# Patient Record
Sex: Male | Born: 1945
Health system: Southern US, Community
[De-identification: ages and names within clinical notes are randomized; demographics above are authoritative.]

## PROBLEM LIST (undated history)

## (undated) DIAGNOSIS — F418 Other specified anxiety disorders: Secondary | ICD-10-CM

## (undated) DIAGNOSIS — F32A Depression, unspecified: Secondary | ICD-10-CM

## (undated) DIAGNOSIS — Z7289 Other problems related to lifestyle: Secondary | ICD-10-CM

## (undated) DIAGNOSIS — G20 Parkinson's disease: Secondary | ICD-10-CM

## (undated) DIAGNOSIS — I10 Essential (primary) hypertension: Secondary | ICD-10-CM

## (undated) DIAGNOSIS — H269 Unspecified cataract: Secondary | ICD-10-CM

## (undated) DIAGNOSIS — Z789 Other specified health status: Secondary | ICD-10-CM

## (undated) DIAGNOSIS — F329 Major depressive disorder, single episode, unspecified: Secondary | ICD-10-CM

## (undated) DIAGNOSIS — I639 Cerebral infarction, unspecified: Secondary | ICD-10-CM

## (undated) DIAGNOSIS — Z720 Tobacco use: Secondary | ICD-10-CM

## (undated) DIAGNOSIS — F1910 Other psychoactive substance abuse, uncomplicated: Secondary | ICD-10-CM

## (undated) DIAGNOSIS — G709 Myoneural disorder, unspecified: Secondary | ICD-10-CM

## (undated) DIAGNOSIS — E785 Hyperlipidemia, unspecified: Secondary | ICD-10-CM

## (undated) DIAGNOSIS — J449 Chronic obstructive pulmonary disease, unspecified: Secondary | ICD-10-CM

## (undated) DIAGNOSIS — G9389 Other specified disorders of brain: Secondary | ICD-10-CM

## (undated) DIAGNOSIS — F419 Anxiety disorder, unspecified: Secondary | ICD-10-CM

## (undated) HISTORY — DX: Anxiety disorder, unspecified: F41.9

## (undated) HISTORY — DX: Myoneural disorder, unspecified: G70.9

## (undated) HISTORY — DX: Hyperlipidemia, unspecified: E78.5

## (undated) HISTORY — PX: APPENDECTOMY: SHX54

## (undated) HISTORY — DX: Major depressive disorder, single episode, unspecified: F32.9

## (undated) HISTORY — PX: EYE SURGERY: SHX253

## (undated) HISTORY — DX: Other psychoactive substance abuse, uncomplicated: F19.10

## (undated) HISTORY — DX: Essential (primary) hypertension: I10

## (undated) HISTORY — DX: Depression, unspecified: F32.A

## (undated) HISTORY — DX: Cerebral infarction, unspecified: I63.9

## (undated) HISTORY — DX: Unspecified cataract: H26.9

## (undated) HISTORY — PX: SHOULDER SURGERY: SHX246

---

## 2009-10-06 ENCOUNTER — Emergency Department (HOSPITAL_COMMUNITY): Admission: EM | Admit: 2009-10-06 | Discharge: 2009-10-06 | Payer: Self-pay | Admitting: Emergency Medicine

## 2010-06-25 ENCOUNTER — Emergency Department (HOSPITAL_COMMUNITY): Admission: EM | Admit: 2010-06-25 | Discharge: 2010-06-25 | Payer: Self-pay | Admitting: Emergency Medicine

## 2010-11-08 LAB — CBC
HCT: 45.7 % (ref 39.0–52.0)
MCH: 32.9 pg (ref 26.0–34.0)
MCV: 95.5 fL (ref 78.0–100.0)
RBC: 4.78 MIL/uL (ref 4.22–5.81)
WBC: 10.4 10*3/uL (ref 4.0–10.5)

## 2010-11-08 LAB — DIFFERENTIAL
Basophils Absolute: 0 10*3/uL (ref 0.0–0.1)
Eosinophils Absolute: 0 10*3/uL (ref 0.0–0.7)
Eosinophils Relative: 0 % (ref 0–5)
Lymphocytes Relative: 15 % (ref 12–46)
Monocytes Absolute: 0.5 10*3/uL (ref 0.1–1.0)

## 2010-11-08 LAB — COMPREHENSIVE METABOLIC PANEL
ALT: 16 U/L (ref 0–53)
AST: 22 U/L (ref 0–37)
Albumin: 3.7 g/dL (ref 3.5–5.2)
Alkaline Phosphatase: 83 U/L (ref 39–117)
CO2: 24 mEq/L (ref 19–32)
Chloride: 99 mEq/L (ref 96–112)
GFR calc Af Amer: >60 mL/min (ref >60)
GFR calc non Af Amer: >60 mL/min (ref >60)
Potassium: 3.1 mEq/L — ABNORMAL LOW (ref 3.5–5.1)
Sodium: 131 mEq/L — ABNORMAL LOW (ref 135–145)
Total Bilirubin: 0.7 mg/dL (ref 0.3–1.2)

## 2010-11-08 LAB — CK TOTAL AND CKMB (NOT AT ARMC): CK, MB: 1.8 ng/mL (ref 0.3–4.0)

## 2010-11-16 LAB — TSH: TSH: 1.924 u[IU]/mL (ref 0.350–4.500)

## 2010-11-16 LAB — CBC
HCT: 49.3 % (ref 39.0–52.0)
MCHC: 34.4 g/dL (ref 30.0–36.0)
MCV: 95.1 fL (ref 78.0–100.0)
RBC: 5.18 MIL/uL (ref 4.22–5.81)

## 2010-11-16 LAB — RAPID URINE DRUG SCREEN, HOSP PERFORMED
Cocaine: NOT DETECTED
Tetrahydrocannabinol: NOT DETECTED

## 2010-11-16 LAB — DIFFERENTIAL
Basophils Absolute: 0 10*3/uL (ref 0.0–0.1)
Eosinophils Relative: 1 % (ref 0–5)
Lymphocytes Relative: 22 % (ref 12–46)
Lymphs Abs: 2 10*3/uL (ref 0.7–4.0)
Neutro Abs: 6.3 10*3/uL (ref 1.7–7.7)
Neutrophils Relative %: 70 % (ref 43–77)

## 2010-11-16 LAB — COMPREHENSIVE METABOLIC PANEL
AST: 21 U/L (ref 0–37)
BUN: 5 mg/dL — ABNORMAL LOW (ref 6–23)
CO2: 25 mEq/L (ref 19–32)
Calcium: 8.6 mg/dL (ref 8.4–10.5)
Creatinine, Ser: 1.02 mg/dL (ref 0.4–1.5)
GFR calc Af Amer: >60 mL/min (ref >60)
GFR calc non Af Amer: >60 mL/min (ref >60)
Total Bilirubin: 0.6 mg/dL (ref 0.3–1.2)

## 2015-01-15 ENCOUNTER — Observation Stay (HOSPITAL_BASED_OUTPATIENT_CLINIC_OR_DEPARTMENT_OTHER)
Admission: EM | Admit: 2015-01-15 | Discharge: 2015-01-16 | Disposition: A | Discharge status: Home / Self Care | Payer: Medicare Other | Source: Home / Self Care | Attending: Emergency Medicine | Admitting: Emergency Medicine

## 2015-01-15 ENCOUNTER — Encounter (HOSPITAL_COMMUNITY): Payer: Self-pay | Admitting: *Deleted

## 2015-01-15 ENCOUNTER — Emergency Department (HOSPITAL_COMMUNITY): Payer: Medicare Other

## 2015-01-15 DIAGNOSIS — R739 Hyperglycemia, unspecified: Secondary | ICD-10-CM | POA: Diagnosis present

## 2015-01-15 DIAGNOSIS — F418 Other specified anxiety disorders: Secondary | ICD-10-CM | POA: Diagnosis present

## 2015-01-15 DIAGNOSIS — F17210 Nicotine dependence, cigarettes, uncomplicated: Secondary | ICD-10-CM | POA: Diagnosis present

## 2015-01-15 DIAGNOSIS — J441 Chronic obstructive pulmonary disease with (acute) exacerbation: Secondary | ICD-10-CM | POA: Diagnosis present

## 2015-01-15 DIAGNOSIS — J9801 Acute bronchospasm: Secondary | ICD-10-CM | POA: Diagnosis present

## 2015-01-15 DIAGNOSIS — R471 Dysarthria and anarthria: Secondary | ICD-10-CM | POA: Diagnosis present

## 2015-01-15 DIAGNOSIS — I639 Cerebral infarction, unspecified: Principal | ICD-10-CM | POA: Diagnosis present

## 2015-01-15 DIAGNOSIS — Z825 Family history of asthma and other chronic lower respiratory diseases: Secondary | ICD-10-CM

## 2015-01-15 DIAGNOSIS — G9389 Other specified disorders of brain: Secondary | ICD-10-CM | POA: Diagnosis present

## 2015-01-15 DIAGNOSIS — R930 Abnormal findings on diagnostic imaging of skull and head, not elsewhere classified: Secondary | ICD-10-CM | POA: Diagnosis present

## 2015-01-15 DIAGNOSIS — E669 Obesity, unspecified: Secondary | ICD-10-CM | POA: Diagnosis present

## 2015-01-15 DIAGNOSIS — G20A1 Parkinson's disease without dyskinesia, without mention of fluctuations: Secondary | ICD-10-CM | POA: Diagnosis present

## 2015-01-15 DIAGNOSIS — R29810 Facial weakness: Secondary | ICD-10-CM | POA: Diagnosis present

## 2015-01-15 DIAGNOSIS — G20 Parkinson's disease: Secondary | ICD-10-CM | POA: Diagnosis present

## 2015-01-15 DIAGNOSIS — Z7982 Long term (current) use of aspirin: Secondary | ICD-10-CM

## 2015-01-15 DIAGNOSIS — Z79899 Other long term (current) drug therapy: Secondary | ICD-10-CM | POA: Insufficient documentation

## 2015-01-15 DIAGNOSIS — R4781 Slurred speech: Secondary | ICD-10-CM

## 2015-01-15 DIAGNOSIS — R42 Dizziness and giddiness: Secondary | ICD-10-CM | POA: Diagnosis not present

## 2015-01-15 DIAGNOSIS — Z720 Tobacco use: Secondary | ICD-10-CM | POA: Insufficient documentation

## 2015-01-15 HISTORY — DX: Parkinson's disease: G20

## 2015-01-15 LAB — COMPREHENSIVE METABOLIC PANEL
ALT: 12 U/L — ABNORMAL LOW (ref 17–63)
AST: 18 U/L (ref 15–41)
Albumin: 3.6 g/dL (ref 3.5–5.0)
Alkaline Phosphatase: 84 U/L (ref 38–126)
Anion gap: 5 (ref 5–15)
BUN: 15 mg/dL (ref 6–20)
CALCIUM: 8.2 mg/dL — AB (ref 8.9–10.3)
CHLORIDE: 108 mmol/L (ref 101–111)
CO2: 28 mmol/L (ref 22–32)
CREATININE: 1.03 mg/dL (ref 0.61–1.24)
Glucose, Bld: 116 mg/dL — ABNORMAL HIGH (ref 65–99)
Potassium: 3.8 mmol/L (ref 3.5–5.1)
SODIUM: 141 mmol/L (ref 135–145)
TOTAL PROTEIN: 6.5 g/dL (ref 6.5–8.1)
Total Bilirubin: 0.6 mg/dL (ref 0.3–1.2)

## 2015-01-15 LAB — CBC WITH DIFFERENTIAL/PLATELET
BASOS PCT: 1 % (ref 0–1)
Basophils Absolute: 0.1 10*3/uL (ref 0.0–0.1)
EOS ABS: 0.4 10*3/uL (ref 0.0–0.7)
EOS PCT: 4 % (ref 0–5)
HCT: 42.3 % (ref 39.0–52.0)
Hemoglobin: 14.5 g/dL (ref 13.0–17.0)
LYMPHS ABS: 2.7 10*3/uL (ref 0.7–4.0)
LYMPHS PCT: 31 % (ref 12–46)
MCH: 32 pg (ref 26.0–34.0)
MCHC: 34.3 g/dL (ref 30.0–36.0)
MCV: 93.4 fL (ref 78.0–100.0)
Monocytes Absolute: 0.5 10*3/uL (ref 0.1–1.0)
Monocytes Relative: 6 % (ref 3–12)
NEUTROS ABS: 5.1 10*3/uL (ref 1.7–7.7)
NEUTROS PCT: 58 % (ref 43–77)
PLATELETS: 209 10*3/uL (ref 150–400)
RBC: 4.53 MIL/uL (ref 4.22–5.81)
RDW: 13 % (ref 11.5–15.5)
WBC: 8.8 10*3/uL (ref 4.0–10.5)

## 2015-01-15 LAB — TROPONIN I: Troponin I: <0.03 ng/mL (ref <0.031)

## 2015-01-15 LAB — BRAIN NATRIURETIC PEPTIDE: B NATRIURETIC PEPTIDE 5: 35 pg/mL (ref 0.0–100.0)

## 2015-01-15 MED ORDER — ASPIRIN 300 MG RE SUPP
300.0000 mg | Freq: Once | RECTAL | Status: AC
  Administered 2015-01-15: 300 mg via RECTAL
  Filled 2015-01-15: qty 1

## 2015-01-15 MED ORDER — IPRATROPIUM-ALBUTEROL 0.5-2.5 (3) MG/3ML IN SOLN
3.0000 mL | Freq: Once | RESPIRATORY_TRACT | Status: AC
  Administered 2015-01-15: 3 mL via RESPIRATORY_TRACT
  Filled 2015-01-15: qty 3

## 2015-01-15 MED ORDER — METHYLPREDNISOLONE SODIUM SUCC 125 MG IJ SOLR
125.0000 mg | Freq: Once | INTRAMUSCULAR | Status: AC
  Administered 2015-01-15: 125 mg via INTRAVENOUS
  Filled 2015-01-15: qty 2

## 2015-01-15 MED ORDER — ASPIRIN 81 MG PO CHEW: 324.0000 mg | CHEWABLE_TABLET | Freq: Once | ORAL | Status: DC

## 2015-01-15 NOTE — ED Notes (Signed)
Pt reporting increasing weakness and some SOB today.  Family reports that they noticed some possible increased difficulty in pt's speech.  Reports symptoms began aprox 8 this morning. Pt does report some worsening of parkinson's symptoms.

## 2015-01-15 NOTE — ED Provider Notes (Signed)
CSN: [id number]     Arrival date & time 01/15/15  2028 History  This chart was scribed for Lucas Essex, MD by Jeanell Sparrow, ED Scribe. This patient was seen in room APA07/[facility]07 and the patient's care was started at 9:06 PM.  Chief Complaint  Patient presents with  . Shortness of Breath  . Weakness   The history is provided by the patient. No language interpreter was used.   HPI Comments: Lucas [name] is a 69 y.o. male hx of Parkinson's disease who presents to the Emergency Department complaining of intermittent SOB that started today. Pt's sister states that pt started having trouble breathing, generalized weakness, and slurred speech today. She reports that pt was completely fine without today's symptoms yesterday. Pt states that he has a hx of smoking. Pt denies any hx of COPD, asthma, or recent trauma. He also denies any cough, chest pain, abdominal pain, headache, dizziness, decreased appetite, or fever.    Past Medical History  Diagnosis Date  . Parkinson's disease    Past Surgical History  Procedure Laterality Date  . Eye surgery     History reviewed. No pertinent family history. History  Substance Use Topics  . Smoking status: Current Every Day Smoker -- 1.00 packs/day  . Smokeless tobacco: Not on file  . Alcohol Use: Yes     Comment: beer occasionally    Review of Systems A complete 10 system review of systems was obtained and all systems are negative except as noted in the HPI and PMH.   Allergies  Review of patient's allergies indicates no known allergies.  Home Medications   Prior to Admission medications   Medication Sig Start Date End Date Taking? Authorizing Provider  carbidopa-levodopa (SINEMET IR) 25-250 MG per tablet Take 1 tablet by mouth 3 (three) times daily.   Yes Historical Provider, MD  clonazePAM (KLONOPIN) 0.5 MG tablet Take 0.5 mg by mouth 2 (two) times daily as needed for anxiety.   Yes Historical Provider, MD  gabapentin (NEURONTIN) 300 MG  capsule Take 300 mg by mouth 3 (three) times daily.   Yes Historical Provider, MD  Multiple Vitamin (MULTIVITAMIN WITH MINERALS) TABS tablet Take 1 tablet by mouth daily.   Yes Historical Provider, MD  PARoxetine (PAXIL) 40 MG tablet Take 40 mg by mouth every morning.   Yes Historical Provider, MD  rOPINIRole (REQUIP) 1 MG tablet Take 1 mg by mouth 2 (two) times daily.   Yes Historical Provider, MD  BP 121/88 mmHg  Pulse 69  Temp(Src) 98.7 F (37.1 C) (Oral)  Resp 16  Ht 5\' 8"  (1.727 m)  Wt 185 lb (83.915 kg)  BMI 28.14 kg/m2  SpO2 94%   Physical Exam  Constitutional: He is oriented to person, place, and time. He appears well-developed and well-nourished. No distress.  HENT:  Head: Normocephalic and atraumatic.  Mouth/Throat: Oropharynx is clear and moist. No oropharyngeal exudate.  Eyes: Conjunctivae and EOM are normal. Pupils are equal, round, and reactive to light.  Neck: Normal range of motion. Neck supple.  No meningismus.  Cardiovascular: Normal rate, regular rhythm, normal heart sounds and intact distal pulses.   No murmur heard. Pulmonary/Chest: Effort normal. No respiratory distress. He has wheezes.  Moderate air exchange. Diffuse wheezing.   Abdominal: Soft. There is no tenderness. There is no rebound and no guarding.  Musculoskeletal: Normal range of motion. He exhibits no edema or tenderness.  Neurological: He is alert and oriented to person, place, and time. No cranial nerve deficit.  He exhibits normal muscle tone. Coordination normal.  No ataxia on finger to nose bilaterally. No pronator drift. 5/5 strength throughout. CN 2-12 intact. Equal grip strength. Sensation intact.  Slurred speech. Resting tremor, LUE greater than RUE. No facial droop  Skin: Skin is warm.  Psychiatric: He has a normal mood and affect. His behavior is normal.  Nursing note and vitals reviewed.   ED Course  Procedures (including critical care time) DIAGNOSTIC STUDIES: Oxygen Saturation is  95% on RA, normal by my interpretation.    COORDINATION OF CARE: 9:10 PM- Pt advised of plan for treatment which includes medication, radiology, and labs and pt agrees.  Labs Review Labs Reviewed  COMPREHENSIVE METABOLIC PANEL - Abnormal; Notable for the following:    Glucose, Bld 116 (*)    Calcium 8.2 (*)    ALT 12 (*)    All other components within normal limits  CBC WITH DIFFERENTIAL/PLATELET  TROPONIN I  BRAIN NATRIURETIC PEPTIDE  URINALYSIS, ROUTINE W REFLEX MICROSCOPIC    Imaging Review Dg Chest 2 View  01/15/2015   CLINICAL DATA:  Increasing weakness and shortness of breath.  EXAM: CHEST  2 VIEW  COMPARISON:  06/26/2010  FINDINGS: The cardiomediastinal contours are normal. There are diffuse fine reticular opacities throughout both lungs. No consolidation, pleural effusion, or pneumothorax. No acute osseous abnormalities are seen. Degenerative change throughout the spine.  IMPRESSION: Fine diffuse reticular opacities throughout both lungs. This may be chronic, or seen in atypical infection.   Electronically Signed   By: Jeb Levering M.D.   On: 01/15/2015 23:28   Ct Head Wo Contrast  01/15/2015   CLINICAL DATA:  Initial evaluation for increase weakness, slurred speech.  EXAM: CT HEAD WITHOUT CONTRAST  TECHNIQUE: Contiguous axial images were obtained from the base of the skull through the vertex without intravenous contrast.  COMPARISON:  Prior study from 10/06/2009.  FINDINGS: Code cerebral volume within normal limits for patient age. Ventricular prominence out of proportion to the cortical sulcation is stable from previous examination. A few small remote lacunar infarct present within the bilateral basal ganglia.  No acute large vessel territory infarct. No intracranial hemorrhage. No mass lesion or mass effect. No extra-axial fluid collection.  Scalp soft tissues within normal limits. No acute abnormality about the orbits.  Mucosal thickening with layering opacity present within  the partially visualized maxillary sinuses. Small amount of opacity present within the sphenoid sinuses as well. Scattered mucosal thickening present within the ethmoidal air cells. Scattered opacity present within the left mastoid air cells. Right mastoid air cells clear.  Calvarium intact.  IMPRESSION: 1. No acute intracranial process. 2. Small remote lacunar infarcts involving the bilateral basal ganglia. 3. Prominence of the ventricular system relative to cortical sulcation, which can be seen with NPH. This is stable relative to previous CT from 2011.   Electronically Signed   By: Jeannine Boga M.D.   On: 01/15/2015 22:33     EKG Interpretation   Date/Time:  Saturday Jan 15 2015 21:22:12 EDT Ventricular Rate:  70 PR Interval:  182 QRS Duration: 87 QT Interval:  397 QTC Calculation: 428 R Axis:   60 Text Interpretation:  Sinus rhythm Minimal ST depression, lateral leads  Lateral ST depression Confirmed by Wyvonnia Dusky  MD, Sheritta Deeg [phone]) on  01/15/2015 9:26:45 PM      MDM   Final diagnoses:  Dysarthria  COPD exacerbation   Generalized weakness, difficulty breathing and shortness of breath today. No history of asthma or COPD but he  is a smoker. Patient with history of Parkinson's. Family also reports he said slurred speech throughout the day today. Last seen normal yesterday. Code stroke not acitivated due to delayed presentation.  Wheezing on exam, no hypoxia. Nebs, steroids, chest x-ray, CT head.  CXR shows reticular opacities.  Old lacunar infarcts on CT head. Patient with no knowledge of this.  Wheezing and SOB improved with treatment  No hypoxia, no chest pain. Suspect COPD excerbation. Concern for new stroke with dysarthria.  Unable to obtain MRI tonight.  D/w Dr. Shanon Brow who will admit.   I personally performed the services described in this documentation, which was scribed in my presence. The recorded information has been reviewed and is accurate.    Lucas Essex, MD 01/16/15 [phone]

## 2015-01-15 NOTE — ED Notes (Signed)
MD notified that patient did not pass the stroke swallow screen.

## 2015-01-16 DIAGNOSIS — J441 Chronic obstructive pulmonary disease with (acute) exacerbation: Secondary | ICD-10-CM

## 2015-01-16 DIAGNOSIS — R471 Dysarthria and anarthria: Secondary | ICD-10-CM | POA: Diagnosis not present

## 2015-01-16 DIAGNOSIS — J9801 Acute bronchospasm: Secondary | ICD-10-CM

## 2015-01-16 DIAGNOSIS — R930 Abnormal findings on diagnostic imaging of skull and head, not elsewhere classified: Secondary | ICD-10-CM

## 2015-01-16 DIAGNOSIS — G20 Parkinson's disease: Secondary | ICD-10-CM

## 2015-01-16 LAB — CBC
HCT: 45.5 % (ref 39.0–52.0)
Hemoglobin: 15.4 g/dL (ref 13.0–17.0)
MCH: 31.7 pg (ref 26.0–34.0)
MCHC: 33.8 g/dL (ref 30.0–36.0)
MCV: 93.6 fL (ref 78.0–100.0)
Platelets: 241 10*3/uL (ref 150–400)
RBC: 4.86 MIL/uL (ref 4.22–5.81)
RDW: 13.2 % (ref 11.5–15.5)
WBC: 8.5 10*3/uL (ref 4.0–10.5)

## 2015-01-16 LAB — URINALYSIS, ROUTINE W REFLEX MICROSCOPIC
Bilirubin Urine: NEGATIVE
Glucose, UA: NEGATIVE mg/dL
Hgb urine dipstick: NEGATIVE
Ketones, ur: NEGATIVE mg/dL
Leukocytes, UA: NEGATIVE
NITRITE: NEGATIVE
Protein, ur: NEGATIVE mg/dL
Specific Gravity, Urine: 1.01 (ref 1.005–1.030)
UROBILINOGEN UA: 0.2 mg/dL (ref 0.0–1.0)
pH: 7 (ref 5.0–8.0)

## 2015-01-16 LAB — BASIC METABOLIC PANEL
ANION GAP: 10 (ref 5–15)
BUN: 16 mg/dL (ref 6–20)
CALCIUM: 8.7 mg/dL — AB (ref 8.9–10.3)
CO2: 24 mmol/L (ref 22–32)
Chloride: 106 mmol/L (ref 101–111)
Creatinine, Ser: 0.99 mg/dL (ref 0.61–1.24)
GFR calc Af Amer: >60 mL/min (ref >60)
Glucose, Bld: 166 mg/dL — ABNORMAL HIGH (ref 65–99)
Potassium: 3.5 mmol/L (ref 3.5–5.1)
SODIUM: 140 mmol/L (ref 135–145)

## 2015-01-16 MED ORDER — ADULT MULTIVITAMIN W/MINERALS CH
1.0000 | ORAL_TABLET | Freq: Every day | ORAL | Status: DC
  Administered 2015-01-16: 1 via ORAL
  Filled 2015-01-16: qty 1

## 2015-01-16 MED ORDER — SODIUM CHLORIDE 0.9 % IJ SOLN: 3.0000 mL | Freq: Two times a day (BID) | INTRAMUSCULAR | Status: DC

## 2015-01-16 MED ORDER — ASPIRIN EC 81 MG PO TBEC
81.0000 mg | DELAYED_RELEASE_TABLET | Freq: Every day | ORAL | Status: DC
  Administered 2015-01-16: 81 mg via ORAL
  Filled 2015-01-16: qty 1

## 2015-01-16 MED ORDER — GABAPENTIN 300 MG PO CAPS
300.0000 mg | ORAL_CAPSULE | Freq: Three times a day (TID) | ORAL | Status: DC
  Administered 2015-01-16: 300 mg via ORAL
  Filled 2015-01-16: qty 1

## 2015-01-16 MED ORDER — ROPINIROLE HCL 1 MG PO TABS
1.0000 mg | ORAL_TABLET | Freq: Two times a day (BID) | ORAL | Status: DC
  Administered 2015-01-16: 1 mg via ORAL
  Filled 2015-01-16: qty 1

## 2015-01-16 MED ORDER — CARBIDOPA-LEVODOPA 25-250 MG PO TABS: 1.0000 | ORAL_TABLET | Freq: Three times a day (TID) | ORAL | Status: DC

## 2015-01-16 MED ORDER — PNEUMOCOCCAL VAC POLYVALENT 25 MCG/0.5ML IJ INJ
0.5000 mL | INJECTION | INTRAMUSCULAR | Status: DC
  Filled 2015-01-16: qty 0.5

## 2015-01-16 MED ORDER — ALBUTEROL SULFATE (2.5 MG/3ML) 0.083% IN NEBU
2.5000 mg | INHALATION_SOLUTION | Freq: Four times a day (QID) | RESPIRATORY_TRACT | Status: DC
  Administered 2015-01-16 (×2): 2.5 mg via RESPIRATORY_TRACT
  Filled 2015-01-16 (×2): qty 3

## 2015-01-16 MED ORDER — ALBUTEROL SULFATE HFA 108 (90 BASE) MCG/ACT IN AERS: 2.0000 | INHALATION_SPRAY | Freq: Four times a day (QID) | RESPIRATORY_TRACT | Status: DC | PRN

## 2015-01-16 MED ORDER — SODIUM CHLORIDE 0.9 % IV SOLN: 250.0000 mL | INTRAVENOUS | Status: DC | PRN

## 2015-01-16 MED ORDER — SODIUM CHLORIDE 0.9 % IJ SOLN
3.0000 mL | INTRAMUSCULAR | Status: DC | PRN
Start: 2015-01-16 — End: 2015-01-16

## 2015-01-16 MED ORDER — PAROXETINE HCL 20 MG PO TABS
40.0000 mg | ORAL_TABLET | ORAL | Status: DC
  Administered 2015-01-16: 40 mg via ORAL
  Filled 2015-01-16: qty 2

## 2015-01-16 MED ORDER — ALBUTEROL SULFATE (2.5 MG/3ML) 0.083% IN NEBU: 2.5000 mg | INHALATION_SOLUTION | RESPIRATORY_TRACT | Status: DC | PRN

## 2015-01-16 MED ORDER — PREDNISONE 20 MG PO TABS
20.0000 mg | ORAL_TABLET | Freq: Two times a day (BID) | ORAL | Status: DC
  Administered 2015-01-16: 20 mg via ORAL
  Filled 2015-01-16: qty 1

## 2015-01-16 MED ORDER — AZITHROMYCIN 250 MG PO TABS
500.0000 mg | ORAL_TABLET | Freq: Every day | ORAL | Status: AC
  Administered 2015-01-16: 500 mg via ORAL
  Filled 2015-01-16: qty 2

## 2015-01-16 MED ORDER — AZITHROMYCIN 250 MG PO TABS: 250.0000 mg | ORAL_TABLET | Freq: Every day | ORAL | Status: DC

## 2015-01-16 MED ORDER — ASPIRIN 81 MG PO TBEC: 81.0000 mg | DELAYED_RELEASE_TABLET | Freq: Every day | ORAL | Status: AC

## 2015-01-16 MED ORDER — SODIUM CHLORIDE 0.9 % IJ SOLN
3.0000 mL | Freq: Two times a day (BID) | INTRAMUSCULAR | Status: DC
  Administered 2015-01-16: 3 mL via INTRAVENOUS

## 2015-01-16 NOTE — Progress Notes (Signed)
Patient failed swallow screen in the ED. Patient reports always using a straw at home and that he was not used to drinking straight out of a cup. RN repeated swallow screen and patient passed. Cardiac diet is not in place.

## 2015-01-16 NOTE — Discharge Summary (Addendum)
Physician Discharge Summary  Lucas [name] MWN:[id number] DOB: 1945/10/27 DOA: 01/15/2015  PCP: No primary care provider on file.  Admit date: 01/15/2015 Discharge date: 01/16/2015  Time spent: 45 minutes  Recommendations for Outpatient Follow-up:  -Will be discharged home today. -Please note that patient has decided to leave despite urging him to stay to complete his testing for potential CVA. -Patient and his sister have stated that they will follow-up with their neurologist in [place] next week.   Discharge Diagnoses:  Principal Problem:   Dysarthria Active Problems:   Parkinson's disease   Bronchospasm, acute   Abnormal CT scan of head   Discharge Condition: Stable and improved  Filed Weights   01/15/15 2033 01/16/15 0149  Weight: 83.915 kg (185 lb) 79.9 kg (176 lb 2.4 oz)    History of present illness:  69 yo male h/o parkinsons disease last 4 years comes in with sob, wheezing and slurred speech. Pt has not h/o asthma or copd but does smoke. He denies any fevers or coughing. No le edema or swelling. He came in the ED for the wheezing but also for slurred speech which is new. He does not take any sedatives at home, no pain meds or nerve pills. He denies any numbness/tingling anywhere. No weakness in any extremity. No facial drooping or drooling. No new medications recently. He received solumedrol and alb neb x 1 in ED and his wheezing resolved but the slurred speech has persisted. We are asked to observe pt for mri in am of his brain for possible cva.  Hospital Course:   Dysarthria -Per patient and sister this has resolved. Patient thinks that this was due to loose dentures. -On exam I do note a slight left-sided facial droop, however sister Vaughan Basta states that this has been there for a while. -Patient was admitted to complete stroke workup, however MRI is unavailable today and patient is unwilling to stay in the hospital to have this completed. -They state  they will follow-up with his neurologist in [place] next week. -CT scan shows evidence for small remote lacunar infarcts involving the bilateral basal ganglia.  Rest of chronic issues are presumed to be stable.  Spent over 30 minutes counseling patient and his sister on danger of leaving the hospital.  Procedures:  None   Consultations:  None  Discharge Instructions  Discharge Instructions    Increase activity slowly    Complete by:  As directed             Medication List    TAKE these medications        albuterol 108 (90 BASE) MCG/ACT inhaler  Commonly known as:  PROVENTIL HFA;VENTOLIN HFA  Inhale 2 puffs into the lungs every 6 (six) hours as needed for wheezing or shortness of breath.     aspirin 81 MG EC tablet  Take 1 tablet (81 mg total) by mouth daily.     carbidopa-levodopa 25-250 MG per tablet  Commonly known as:  SINEMET IR  Take 1 tablet by mouth 3 (three) times daily.     clonazePAM 0.5 MG tablet  Commonly known as:  KLONOPIN  Take 0.5 mg by mouth 2 (two) times daily as needed for anxiety.     gabapentin 300 MG capsule  Commonly known as:  NEURONTIN  Take 300 mg by mouth 3 (three) times daily.     multivitamin with minerals Tabs tablet  Take 1 tablet by mouth daily.     PARoxetine 40 MG tablet  Commonly known as:  PAXIL  Take 40 mg by mouth every morning.     rOPINIRole 1 MG tablet  Commonly known as:  REQUIP  Take 1 mg by mouth 2 (two) times daily.       No Known Allergies     Follow-up Information    Follow up In 2 days.   Why:  Your neurologist in [place]       The results of significant diagnostics from this hospitalization (including imaging, microbiology, ancillary and laboratory) are listed below for reference.    Significant Diagnostic Studies: Dg Chest 2 View  01/15/2015   CLINICAL DATA:  Increasing weakness and shortness of breath.  EXAM: CHEST  2 VIEW  COMPARISON:  06/26/2010  FINDINGS: The cardiomediastinal contours  are normal. There are diffuse fine reticular opacities throughout both lungs. No consolidation, pleural effusion, or pneumothorax. No acute osseous abnormalities are seen. Degenerative change throughout the spine.  IMPRESSION: Fine diffuse reticular opacities throughout both lungs. This may be chronic, or seen in atypical infection.   Electronically Signed   By: Jeb Levering M.D.   On: 01/15/2015 23:28   Ct Head Wo Contrast  01/15/2015   CLINICAL DATA:  Initial evaluation for increase weakness, slurred speech.  EXAM: CT HEAD WITHOUT CONTRAST  TECHNIQUE: Contiguous axial images were obtained from the base of the skull through the vertex without intravenous contrast.  COMPARISON:  Prior study from 10/06/2009.  FINDINGS: Code cerebral volume within normal limits for patient age. Ventricular prominence out of proportion to the cortical sulcation is stable from previous examination. A few small remote lacunar infarct present within the bilateral basal ganglia.  No acute large vessel territory infarct. No intracranial hemorrhage. No mass lesion or mass effect. No extra-axial fluid collection.  Scalp soft tissues within normal limits. No acute abnormality about the orbits.  Mucosal thickening with layering opacity present within the partially visualized maxillary sinuses. Small amount of opacity present within the sphenoid sinuses as well. Scattered mucosal thickening present within the ethmoidal air cells. Scattered opacity present within the left mastoid air cells. Right mastoid air cells clear.  Calvarium intact.  IMPRESSION: 1. No acute intracranial process. 2. Small remote lacunar infarcts involving the bilateral basal ganglia. 3. Prominence of the ventricular system relative to cortical sulcation, which can be seen with NPH. This is stable relative to previous CT from 2011.   Electronically Signed   By: Jeannine Boga M.D.   On: 01/15/2015 22:33    Microbiology: No results found for this or any  previous visit (from the past 240 hour(s)).   Labs: Basic Metabolic Panel:  Recent Labs Lab 01/15/15 2116 01/16/15 0559  NA 141 140  K 3.8 3.5  CL 108 106  CO2 28 24  GLUCOSE 116* 166*  BUN 15 16  CREATININE 1.03 0.99  CALCIUM 8.2* 8.7*   Liver Function Tests:  Recent Labs Lab 01/15/15 2116  AST 18  ALT 12*  ALKPHOS 84  BILITOT 0.6  PROT 6.5  ALBUMIN 3.6   No results for input(s): LIPASE, AMYLASE in the last 168 hours. No results for input(s): AMMONIA in the last 168 hours. CBC:  Recent Labs Lab 01/15/15 2116 01/16/15 0559  WBC 8.8 8.5  NEUTROABS 5.1  --   HGB 14.5 15.4  HCT 42.3 45.5  MCV 93.4 93.6  PLT 209 241   Cardiac Enzymes:  Recent Labs Lab 01/15/15 2116  TROPONINI <0.03   BNP: BNP (last 3 results)  Recent Labs  01/15/15 2116  BNP 35.0    ProBNP (last 3 results) No results for input(s): PROBNP in the last 8760 hours.  CBG: No results for input(s): GLUCAP in the last 168 hours.     SignedLelon Frohlich  [facility] Pager: [phone] 01/16/2015, 1:13 PM

## 2015-01-16 NOTE — H&P (Signed)
PCP:   No primary care provider on file.   Chief Complaint:  Slurred speech  HPI: 69 yo male h/o parkinsons disease last 4 years comes in with sob, wheezing and slurred speech.  Pt has not h/o asthma or copd but does smoke.  He denies any fevers or coughing.  No le edema or swelling. He came in the ED for the wheezing but also for slurred speech which is new.  He does not take any sedatives at home, no pain meds or nerve pills.  He denies any numbness/tingling anywhere.  No weakness in any extremity.  No facial drooping or drooling.   No new medications recently.  He received solumedrol and alb neb x 1 in ED and his wheezing resolved but the slurred speech has persisted.  We are asked to observe pt for mri in am of his brain for possible cva.  Review of Systems:  Positive and negative as per HPI otherwise all other systems are negative  Past Medical History: Past Medical History  Diagnosis Date  . Parkinson's disease    Past Surgical History  Procedure Laterality Date  . Eye surgery      Medications: Prior to Admission medications   Medication Sig Start Date End Date Taking? Authorizing Provider  carbidopa-levodopa (SINEMET IR) 25-250 MG per tablet Take 1 tablet by mouth 3 (three) times daily.   Yes Historical Provider, MD  clonazePAM (KLONOPIN) 0.5 MG tablet Take 0.5 mg by mouth 2 (two) times daily as needed for anxiety.   Yes Historical Provider, MD  gabapentin (NEURONTIN) 300 MG capsule Take 300 mg by mouth 3 (three) times daily.   Yes Historical Provider, MD  Multiple Vitamin (MULTIVITAMIN WITH MINERALS) TABS tablet Take 1 tablet by mouth daily.   Yes Historical Provider, MD  PARoxetine (PAXIL) 40 MG tablet Take 40 mg by mouth every morning.   Yes Historical Provider, MD  rOPINIRole (REQUIP) 1 MG tablet Take 1 mg by mouth 2 (two) times daily.   Yes Historical Provider, MD    Allergies:  No Known Allergies  Social History:  reports that he has been smoking.  He does not have  any smokeless tobacco history on file. He reports that he drinks alcohol. He reports that he does not use illicit drugs.  Family History: No strokes  Physical Exam: Filed Vitals:   01/15/15 2200 01/15/15 2230 01/15/15 2300 01/15/15 2330  BP: 140/94 123/74 121/76 127/79  Pulse: 73  71 67  Temp:      TempSrc:      Resp: 21 18 18 15   Height:      Weight:      SpO2: 95%  100% 94%   General appearance: alert, cooperative and no distress Head: Normocephalic, without obvious abnormality, atraumatic Eyes: negative Nose: Nares normal. Septum midline. Mucosa normal. No drainage or sinus tenderness. Neck: no JVD and supple, symmetrical, trachea midline Lungs: clear to auscultation bilaterally Heart: regular rate and rhythm, S1, S2 normal, no murmur, click, rub or gallop Abdomen: soft, non-tender; bowel sounds normal; no masses,  no organomegaly Extremities: extremities normal, atraumatic, no cyanosis or edema Pulses: 2+ and symmetric Skin: Skin color, texture, turgor normal. No rashes or lesions Neurologic: Grossly normal slurred/slowed speech.  Resting tremor present.    Labs on Admission:   Recent Labs  01/15/15 2116  NA 141  K 3.8  CL 108  CO2 28  GLUCOSE 116*  BUN 15  CREATININE 1.03  CALCIUM 8.2*    Recent Labs  01/15/15 2116  AST 18  ALT 12*  ALKPHOS 84  BILITOT 0.6  PROT 6.5  ALBUMIN 3.6    Recent Labs  01/15/15 2116  WBC 8.8  NEUTROABS 5.1  HGB 14.5  HCT 42.3  MCV 93.4  PLT 209    Recent Labs  01/15/15 2116  TROPONINI <0.03   Radiological Exams on Admission: Dg Chest 2 View  01/15/2015   CLINICAL DATA:  Increasing weakness and shortness of breath.  EXAM: CHEST  2 VIEW  COMPARISON:  06/26/2010  FINDINGS: The cardiomediastinal contours are normal. There are diffuse fine reticular opacities throughout both lungs. No consolidation, pleural effusion, or pneumothorax. No acute osseous abnormalities are seen. Degenerative change throughout the spine.   IMPRESSION: Fine diffuse reticular opacities throughout both lungs. This may be chronic, or seen in atypical infection.   Electronically Signed   By: Jeb Levering M.D.   On: 01/15/2015 23:28   Ct Head Wo Contrast  01/15/2015   CLINICAL DATA:  Initial evaluation for increase weakness, slurred speech.  EXAM: CT HEAD WITHOUT CONTRAST  TECHNIQUE: Contiguous axial images were obtained from the base of the skull through the vertex without intravenous contrast.  COMPARISON:  Prior study from 10/06/2009.  FINDINGS: Code cerebral volume within normal limits for patient age. Ventricular prominence out of proportion to the cortical sulcation is stable from previous examination. A few small remote lacunar infarct present within the bilateral basal ganglia.  No acute large vessel territory infarct. No intracranial hemorrhage. No mass lesion or mass effect. No extra-axial fluid collection.  Scalp soft tissues within normal limits. No acute abnormality about the orbits.  Mucosal thickening with layering opacity present within the partially visualized maxillary sinuses. Small amount of opacity present within the sphenoid sinuses as well. Scattered mucosal thickening present within the ethmoidal air cells. Scattered opacity present within the left mastoid air cells. Right mastoid air cells clear.  Calvarium intact.  IMPRESSION: 1. No acute intracranial process. 2. Small remote lacunar infarcts involving the bilateral basal ganglia. 3. Prominence of the ventricular system relative to cortical sulcation, which can be seen with NPH. This is stable relative to previous CT from 2011.   Electronically Signed   By: Jeannine Boga M.D.   On: 01/15/2015 22:33   12 lead ekg and cxr reviewed independently by myself  old records reviewed  Assessment/Plan  69 yo male h/o Parkinsons comes in with sob/wheezing and slurred speech  Principal Problem:   Dysarthria-  No other neurological deficits.  Obtain MRI in am of brain.   If abnormal do complete stroke w/u.  freq neuro checks overnight and monitor closely for any further neurological changes.  Aspirin daily.   Active Problems:   Parkinson's disease-  noted   Bronchospasm, acute-  No h/o copd or asthma.  Place on freq albuterol nebs and oral prednisone.  Would recommend repeat cxr in couple of weeks due to the irregularity noted by radiologist.  Start zpack.    Abnormal CT scan of head-  As above.  Observe on telemetry.  Full code.    DAVID,RACHAL A 01/16/2015, 12:06 AM

## 2015-01-17 NOTE — Progress Notes (Signed)
Late Entry: Discharge Note  Dr. Jerilee Hoh was notified about the families agitation and the patients request to be discharged since he was not going to be able to have his MRI done 01/16/2015.  MD went in and saw with the patient and it was determined that the patient would be discharged.  Patient discharged with instructions, prescription, and care notes.  Verbalized understanding via teach back.  IV was removed and the site was WNL. Patient voiced no further complaints or concerns at the time of discharge.  Appointments scheduled per instructions.  Patient left the floor via w/c with staff and family in stable condition.

## 2015-01-19 ENCOUNTER — Inpatient Hospital Stay (HOSPITAL_COMMUNITY): Payer: Medicare Other

## 2015-01-19 ENCOUNTER — Inpatient Hospital Stay (HOSPITAL_COMMUNITY)
Admission: EM | Admit: 2015-01-19 | Discharge: 2015-01-21 | DRG: 065 | Disposition: A | Discharge status: Home / Self Care | Payer: Medicare Other | Attending: Internal Medicine | Admitting: Internal Medicine

## 2015-01-19 ENCOUNTER — Emergency Department (HOSPITAL_COMMUNITY): Payer: Medicare Other

## 2015-01-19 ENCOUNTER — Encounter (HOSPITAL_COMMUNITY): Payer: Self-pay | Admitting: Emergency Medicine

## 2015-01-19 DIAGNOSIS — Z720 Tobacco use: Secondary | ICD-10-CM | POA: Diagnosis not present

## 2015-01-19 DIAGNOSIS — I6350 Cerebral infarction due to unspecified occlusion or stenosis of unspecified cerebral artery: Secondary | ICD-10-CM

## 2015-01-19 DIAGNOSIS — F418 Other specified anxiety disorders: Secondary | ICD-10-CM | POA: Diagnosis present

## 2015-01-19 DIAGNOSIS — I639 Cerebral infarction, unspecified: Secondary | ICD-10-CM

## 2015-01-19 DIAGNOSIS — E669 Obesity, unspecified: Secondary | ICD-10-CM | POA: Diagnosis present

## 2015-01-19 DIAGNOSIS — Z825 Family history of asthma and other chronic lower respiratory diseases: Secondary | ICD-10-CM | POA: Diagnosis not present

## 2015-01-19 DIAGNOSIS — C719 Malignant neoplasm of brain, unspecified: Secondary | ICD-10-CM

## 2015-01-19 DIAGNOSIS — F17210 Nicotine dependence, cigarettes, uncomplicated: Secondary | ICD-10-CM | POA: Diagnosis present

## 2015-01-19 DIAGNOSIS — G9389 Other specified disorders of brain: Secondary | ICD-10-CM | POA: Diagnosis present

## 2015-01-19 DIAGNOSIS — J9801 Acute bronchospasm: Secondary | ICD-10-CM | POA: Diagnosis present

## 2015-01-19 DIAGNOSIS — Z7982 Long term (current) use of aspirin: Secondary | ICD-10-CM | POA: Diagnosis not present

## 2015-01-19 DIAGNOSIS — R739 Hyperglycemia, unspecified: Secondary | ICD-10-CM | POA: Diagnosis present

## 2015-01-19 DIAGNOSIS — R42 Dizziness and giddiness: Secondary | ICD-10-CM

## 2015-01-19 DIAGNOSIS — R29810 Facial weakness: Secondary | ICD-10-CM | POA: Diagnosis present

## 2015-01-19 DIAGNOSIS — J441 Chronic obstructive pulmonary disease with (acute) exacerbation: Secondary | ICD-10-CM

## 2015-01-19 DIAGNOSIS — R471 Dysarthria and anarthria: Secondary | ICD-10-CM

## 2015-01-19 DIAGNOSIS — R531 Weakness: Secondary | ICD-10-CM

## 2015-01-19 DIAGNOSIS — I679 Cerebrovascular disease, unspecified: Secondary | ICD-10-CM | POA: Diagnosis present

## 2015-01-19 DIAGNOSIS — Z789 Other specified health status: Secondary | ICD-10-CM | POA: Diagnosis present

## 2015-01-19 DIAGNOSIS — Z7289 Other problems related to lifestyle: Secondary | ICD-10-CM | POA: Diagnosis present

## 2015-01-19 DIAGNOSIS — G20 Parkinson's disease: Secondary | ICD-10-CM

## 2015-01-19 HISTORY — DX: Other problems related to lifestyle: Z72.89

## 2015-01-19 HISTORY — DX: Cerebral infarction, unspecified: I63.9

## 2015-01-19 HISTORY — DX: Other specified anxiety disorders: F41.8

## 2015-01-19 HISTORY — DX: Tobacco use: Z72.0

## 2015-01-19 HISTORY — DX: Other specified disorders of brain: G93.89

## 2015-01-19 HISTORY — DX: Other specified health status: Z78.9

## 2015-01-19 HISTORY — DX: Chronic obstructive pulmonary disease, unspecified: J44.9

## 2015-01-19 LAB — URINALYSIS, ROUTINE W REFLEX MICROSCOPIC
BILIRUBIN URINE: NEGATIVE
Glucose, UA: NEGATIVE mg/dL
Hgb urine dipstick: NEGATIVE
Leukocytes, UA: NEGATIVE
NITRITE: NEGATIVE
PH: 6 (ref 5.0–8.0)
Protein, ur: NEGATIVE mg/dL
Specific Gravity, Urine: 1.02 (ref 1.005–1.030)
Urobilinogen, UA: 0.2 mg/dL (ref 0.0–1.0)

## 2015-01-19 LAB — DIFFERENTIAL
BASOS PCT: 1 % (ref 0–1)
Basophils Absolute: 0.1 10*3/uL (ref 0.0–0.1)
Eosinophils Absolute: 0.3 10*3/uL (ref 0.0–0.7)
Eosinophils Relative: 3 % (ref 0–5)
Lymphocytes Relative: 17 % (ref 12–46)
Lymphs Abs: 1.7 10*3/uL (ref 0.7–4.0)
MONO ABS: 0.8 10*3/uL (ref 0.1–1.0)
Monocytes Relative: 8 % (ref 3–12)
NEUTROS ABS: 6.9 10*3/uL (ref 1.7–7.7)
Neutrophils Relative %: 71 % (ref 43–77)

## 2015-01-19 LAB — COMPREHENSIVE METABOLIC PANEL
ALT: 5 U/L — AB (ref 17–63)
AST: 21 U/L (ref 15–41)
Albumin: 3.8 g/dL (ref 3.5–5.0)
Alkaline Phosphatase: 87 U/L (ref 38–126)
Anion gap: 7 (ref 5–15)
BUN: 17 mg/dL (ref 6–20)
CO2: 24 mmol/L (ref 22–32)
Calcium: 8.6 mg/dL — ABNORMAL LOW (ref 8.9–10.3)
Chloride: 109 mmol/L (ref 101–111)
Creatinine, Ser: 0.95 mg/dL (ref 0.61–1.24)
Glucose, Bld: 139 mg/dL — ABNORMAL HIGH (ref 65–99)
Potassium: 3.6 mmol/L (ref 3.5–5.1)
SODIUM: 140 mmol/L (ref 135–145)
Total Bilirubin: 0.8 mg/dL (ref 0.3–1.2)
Total Protein: 6.7 g/dL (ref 6.5–8.1)

## 2015-01-19 LAB — CBC
HCT: 45 % (ref 39.0–52.0)
Hemoglobin: 15.1 g/dL (ref 13.0–17.0)
MCH: 31.6 pg (ref 26.0–34.0)
MCHC: 33.6 g/dL (ref 30.0–36.0)
MCV: 94.1 fL (ref 78.0–100.0)
PLATELETS: 231 10*3/uL (ref 150–400)
RBC: 4.78 MIL/uL (ref 4.22–5.81)
RDW: 13.4 % (ref 11.5–15.5)
WBC: 9.7 10*3/uL (ref 4.0–10.5)

## 2015-01-19 LAB — TROPONIN I

## 2015-01-19 LAB — PROTIME-INR
INR: 1.03 (ref 0.00–1.49)
Prothrombin Time: 13.7 seconds (ref 11.6–15.2)

## 2015-01-19 LAB — RAPID URINE DRUG SCREEN, HOSP PERFORMED
Amphetamines: NOT DETECTED
Barbiturates: NOT DETECTED
Benzodiazepines: NOT DETECTED
Cocaine: NOT DETECTED
OPIATES: NOT DETECTED
TETRAHYDROCANNABINOL: NOT DETECTED

## 2015-01-19 LAB — TSH: TSH: 1.564 u[IU]/mL (ref 0.350–4.500)

## 2015-01-19 LAB — APTT: aPTT: 27 seconds (ref 24–37)

## 2015-01-19 LAB — ETHANOL: Alcohol, Ethyl (B): <5 mg/dL (ref <5)

## 2015-01-19 MED ORDER — ASPIRIN EC 325 MG PO TBEC
325.0000 mg | DELAYED_RELEASE_TABLET | Freq: Every day | ORAL | Status: DC
  Administered 2015-01-19 – 2015-01-21 (×3): 325 mg via ORAL
  Filled 2015-01-19 (×3): qty 1

## 2015-01-19 MED ORDER — ALBUTEROL SULFATE (2.5 MG/3ML) 0.083% IN NEBU: 2.5000 mg | INHALATION_SOLUTION | RESPIRATORY_TRACT | Status: DC

## 2015-01-19 MED ORDER — SENNOSIDES-DOCUSATE SODIUM 8.6-50 MG PO TABS: 1.0000 | ORAL_TABLET | Freq: Every evening | ORAL | Status: DC | PRN

## 2015-01-19 MED ORDER — POTASSIUM CHLORIDE IN NACL 20-0.9 MEQ/L-% IV SOLN
INTRAVENOUS | Status: DC
  Administered 2015-01-19 – 2015-01-20 (×2): via INTRAVENOUS

## 2015-01-19 MED ORDER — ALBUTEROL SULFATE (2.5 MG/3ML) 0.083% IN NEBU
2.5000 mg | INHALATION_SOLUTION | Freq: Four times a day (QID) | RESPIRATORY_TRACT | Status: DC | PRN
  Administered 2015-01-20 – 2015-01-21 (×2): 2.5 mg via RESPIRATORY_TRACT
  Filled 2015-01-19: qty 3

## 2015-01-19 MED ORDER — IOHEXOL 350 MG/ML SOLN
100.0000 mL | Freq: Once | INTRAVENOUS | Status: AC | PRN
  Administered 2015-01-19: 80 mL via INTRAVENOUS

## 2015-01-19 MED ORDER — CLONAZEPAM 0.5 MG PO TABS
0.5000 mg | ORAL_TABLET | Freq: Two times a day (BID) | ORAL | Status: DC | PRN
  Administered 2015-01-20 (×2): 0.5 mg via ORAL
  Filled 2015-01-19 (×2): qty 1

## 2015-01-19 MED ORDER — NICOTINE 21 MG/24HR TD PT24
21.0000 mg | MEDICATED_PATCH | Freq: Every day | TRANSDERMAL | Status: DC
  Administered 2015-01-19 – 2015-01-21 (×3): 21 mg via TRANSDERMAL
  Filled 2015-01-19 (×3): qty 1

## 2015-01-19 MED ORDER — ADULT MULTIVITAMIN W/MINERALS CH
1.0000 | ORAL_TABLET | Freq: Every day | ORAL | Status: DC
  Administered 2015-01-19 – 2015-01-21 (×3): 1 via ORAL
  Filled 2015-01-19 (×3): qty 1

## 2015-01-19 MED ORDER — STROKE: EARLY STAGES OF RECOVERY BOOK
Freq: Once | Status: AC
  Administered 2015-01-19: 17:00:00
  Filled 2015-01-19 (×2): qty 1

## 2015-01-19 MED ORDER — PAROXETINE HCL 20 MG PO TABS
40.0000 mg | ORAL_TABLET | ORAL | Status: DC
  Administered 2015-01-20 – 2015-01-21 (×2): 40 mg via ORAL
  Filled 2015-01-19 (×2): qty 2

## 2015-01-19 MED ORDER — CARBIDOPA-LEVODOPA 25-250 MG PO TABS
1.0000 | ORAL_TABLET | Freq: Three times a day (TID) | ORAL | Status: DC
  Administered 2015-01-19 – 2015-01-21 (×6): 1 via ORAL
  Filled 2015-01-19 (×17): qty 1

## 2015-01-19 MED ORDER — ALBUTEROL SULFATE (2.5 MG/3ML) 0.083% IN NEBU
2.5000 mg | INHALATION_SOLUTION | Freq: Three times a day (TID) | RESPIRATORY_TRACT | Status: DC
  Administered 2015-01-19 – 2015-01-21 (×5): 2.5 mg via RESPIRATORY_TRACT
  Filled 2015-01-19 (×5): qty 3

## 2015-01-19 MED ORDER — HEPARIN SODIUM (PORCINE) 5000 UNIT/ML IJ SOLN
5000.0000 [IU] | Freq: Three times a day (TID) | INTRAMUSCULAR | Status: DC
  Administered 2015-01-19 – 2015-01-21 (×6): 5000 [IU] via SUBCUTANEOUS
  Filled 2015-01-19 (×7): qty 1

## 2015-01-19 MED ORDER — VITAMIN B-1 100 MG PO TABS
250.0000 mg | ORAL_TABLET | Freq: Every day | ORAL | Status: DC
  Administered 2015-01-19 – 2015-01-21 (×3): 250 mg via ORAL
  Filled 2015-01-19 (×3): qty 3

## 2015-01-19 MED ORDER — ACETAMINOPHEN 325 MG PO TABS: 650.0000 mg | ORAL_TABLET | Freq: Four times a day (QID) | ORAL | Status: DC | PRN

## 2015-01-19 MED ORDER — ALBUTEROL SULFATE (2.5 MG/3ML) 0.083% IN NEBU
5.0000 mg | INHALATION_SOLUTION | Freq: Once | RESPIRATORY_TRACT | Status: AC
  Administered 2015-01-19: 5 mg via RESPIRATORY_TRACT
  Filled 2015-01-19: qty 6

## 2015-01-19 MED ORDER — ROPINIROLE HCL 1 MG PO TABS
1.0000 mg | ORAL_TABLET | Freq: Two times a day (BID) | ORAL | Status: DC
  Administered 2015-01-19 – 2015-01-21 (×4): 1 mg via ORAL
  Filled 2015-01-19 (×4): qty 1

## 2015-01-19 MED ORDER — LORAZEPAM 2 MG/ML IJ SOLN
1.0000 mg | Freq: Once | INTRAMUSCULAR | Status: AC
  Administered 2015-01-19: 1 mg via INTRAVENOUS
  Filled 2015-01-19: qty 1

## 2015-01-19 MED ORDER — GABAPENTIN 300 MG PO CAPS
300.0000 mg | ORAL_CAPSULE | Freq: Three times a day (TID) | ORAL | Status: DC
  Administered 2015-01-19 – 2015-01-21 (×6): 300 mg via ORAL
  Filled 2015-01-19 (×6): qty 1

## 2015-01-19 MED ORDER — ALBUTEROL SULFATE HFA 108 (90 BASE) MCG/ACT IN AERS
2.0000 | INHALATION_SPRAY | Freq: Four times a day (QID) | RESPIRATORY_TRACT | Status: DC | PRN
  Filled 2015-01-19: qty 6.7

## 2015-01-19 MED ORDER — ASPIRIN EC 81 MG PO TBEC: 81.0000 mg | DELAYED_RELEASE_TABLET | Freq: Every day | ORAL | Status: DC

## 2015-01-19 NOTE — ED Provider Notes (Signed)
CSN: [id number]     Arrival date & time 01/19/15  1017 History  This chart was scribed for Lucas Belling, MD by Mercy Moore, ED scribe.  This patient was seen in room [facility]10/[facility]10 and the patient's care was started at 10:32 AM.   Chief Complaint  Patient presents with  . Dizziness   The history is provided by the patient and a relative. No language interpreter was used.   HPI Comments: Lucas [name] is a 69 y.o. male with PMHx of Parkinson's Disease who presents to the Emergency Department complaining of difficulty with motor production of speech and ambulation since yesterday. Patient evaluated for his complaints yesterday with orders for an MRI, but patient left AMA. Patient's chart history reveals recent admission, 5/21-5/22, for complaints of shortness of breath, generalized weakness and slurred speech; abrnomal CT of head. Family reports that patient's condition has deteriorated since onset four days ago.    Past Medical History  Diagnosis Date  . Parkinson's disease    Past Surgical History  Procedure Laterality Date  . Eye surgery     History reviewed. No pertinent family history. History  Substance Use Topics  . Smoking status: Current Every Day Smoker -- 1.00 packs/day  . Smokeless tobacco: Not on file  . Alcohol Use: Yes     Comment: beer occasionally    Review of Systems  Constitutional: Negative for fever and chills.  HENT: Negative for congestion, rhinorrhea and sore throat.   Eyes: Negative for visual disturbance.  Respiratory: Positive for cough.   Cardiovascular: Negative for chest pain.  Gastrointestinal: Negative for nausea, vomiting, abdominal pain and diarrhea.  Endocrine: Negative for polyuria.  Genitourinary: Negative for dysuria.  Musculoskeletal: Positive for gait problem. Negative for back pain.  Skin: Negative for rash.  Allergic/Immunologic: Negative for immunocompromised state.  Neurological: Positive for speech difficulty and weakness.  Negative for headaches.  Hematological: Does not bruise/bleed easily.  Psychiatric/Behavioral: Negative for confusion.      Allergies  Review of patient's allergies indicates no known allergies.  Home Medications   Prior to Admission medications   Medication Sig Start Date End Date Taking? Authorizing Provider  albuterol (PROVENTIL HFA;VENTOLIN HFA) 108 (90 BASE) MCG/ACT inhaler Inhale 2 puffs into the lungs every 6 (six) hours as needed for wheezing or shortness of breath. 01/16/15  Yes Estela Leonie Green, MD  aspirin EC 81 MG EC tablet Take 1 tablet (81 mg total) by mouth daily. 01/16/15  Yes Erline Hau, MD  carbidopa-levodopa (SINEMET IR) 25-250 MG per tablet Take 1 tablet by mouth 3 (three) times daily.   Yes Historical Provider, MD  clonazePAM (KLONOPIN) 0.5 MG tablet Take 0.5 mg by mouth 2 (two) times daily as needed for anxiety.   Yes Historical Provider, MD  gabapentin (NEURONTIN) 300 MG capsule Take 300 mg by mouth 3 (three) times daily.   Yes Historical Provider, MD  Multiple Vitamin (MULTIVITAMIN WITH MINERALS) TABS tablet Take 1 tablet by mouth daily.   Yes Historical Provider, MD  PARoxetine (PAXIL) 40 MG tablet Take 40 mg by mouth every morning.   Yes Historical Provider, MD  rOPINIRole (REQUIP) 1 MG tablet Take 1 mg by mouth 2 (two) times daily.   Yes Historical Provider, MD   BP 115/82 mmHg  Pulse 71  Temp(Src) 98.9 F (37.2 C) (Rectal)  Resp 18  Ht 5\' 8"  (1.727 m)  Wt 176 lb (79.833 kg)  BMI 26.77 kg/m2  SpO2 98% Physical Exam  Constitutional: He  is oriented to person, place, and time. He appears well-developed and well-nourished. No distress.  HENT:  Head: Normocephalic and atraumatic.  Eyes: EOM are normal.  Neck: Neck supple. No tracheal deviation present.  Cardiovascular: Normal rate.   Pulmonary/Chest: Effort normal. No respiratory distress.  Lungs with diffuse wheezing. Harsh breath sounds on right.   Musculoskeletal: Normal range of  motion.  No peripheral edema.   Neurological: He is alert and oriented to person, place, and time.  Right sided facial droop. Slurred speech. Good grip strength. Tremor on right greater than left.   Skin: Skin is warm and dry.  Psychiatric: He has a normal mood and affect. His behavior is normal.  Nursing note and vitals reviewed.   ED Course  Procedures (including critical care time)  COORDINATION OF CARE: 10:41 AM- Discussed treatment plan with patient at bedside and patient agreed to plan.   Labs Review Labs Reviewed  COMPREHENSIVE METABOLIC PANEL - Abnormal; Notable for the following:    Glucose, Bld 139 (*)    Calcium 8.6 (*)    ALT 5 (*)    All other components within normal limits  URINALYSIS, ROUTINE W REFLEX MICROSCOPIC - Abnormal; Notable for the following:    Ketones, ur TRACE (*)    All other components within normal limits  ETHANOL  PROTIME-INR  APTT  CBC  DIFFERENTIAL  URINE RAPID DRUG SCREEN (HOSP PERFORMED)  TROPONIN I    Imaging Review Dg Chest 2 View  01/19/2015   CLINICAL DATA:  Dizziness, Parkinson's disease  EXAM: CHEST  2 VIEW  COMPARISON:  01/15/2015  FINDINGS: Cardiomediastinal silhouette is stable. No acute infiltrate or pleural effusion. Probable chronic mild interstitial prominence is stable. Mild degenerative changes lower thoracic spine.  IMPRESSION: No active cardiopulmonary disease.  No significant change.   Electronically Signed   By: Lahoma Crocker M.D.   On: 01/19/2015 12:14     EKG Interpretation   Date/Time:  Wednesday Jan 19 2015 10:51:55 EDT Ventricular Rate:  77 PR Interval:  171 QRS Duration: 76 QT Interval:  383 QTC Calculation: 433 R Axis:   40 Text Interpretation:  Sinus rhythm Minimal ST depression, anterolateral  leads Baseline wander in lead(s) V3 No significant change since last  tracing Confirmed by Alvino Chapel  MD, Ovid Curd [phone]) on 01/19/2015 1:46:48 PM      MDM   Final diagnoses:  Weakness  COPD exacerbation     Patient presents with cough and weakness. Somewhat slurred speech and reportedly increased right-sided and general weakness. Recently left AMA after attempted admission for stroke. Returns and will admit.  I personally performed the services described in this documentation, which was scribed in my presence. The recorded information has been reviewed and is accurate.     Lucas Belling, MD 01/19/15 [phone]

## 2015-01-19 NOTE — ED Notes (Signed)
Escorted pt to [facility].  Steady gait noted.

## 2015-01-19 NOTE — ED Notes (Addendum)
Pt reports was seen for same on Saturday and was supposed to have MRI but left AMA. Pt reports increased difficulty walking,talking since yesterday.

## 2015-01-19 NOTE — Consult Note (Addendum)
[place] A. Merlene Laughter, MD     [email or web address]          Lucas [name] is an 69 y.o. male.   ASSESSMENT/PLAN: 1. Acute infarct involving the left midbrain and thalamic regions. 2. Significant ventriculomegaly worrisome for possible normal pressure hydrocephalus. 3. Underlying Parkinson's disease. 4. Alcoholism. 5. Likely prediabetes. 6. Possible sleep disordered breathing.  RECOMMENDATION: Agree with aspirin 325 mg although per hx was not taking aspirin before.  Speech and occupational therapy along with physical therapy. Follow-up echocardiography and the carotid duplex Doppler. Reduction in alcohol use. Outpatient sleep study. Continue with current dose of Parkinson's medications. Workup of possible normal pressure hydrocephalus by primary neurologist once the patient has gone through the acute stroke phase. Obtain the CT angiography of the brain given that the MRA was of poor quality.  The patient is a 69 year old right handed white male who developed the acute onset of dysarthria a few days ago on Saturday. The patient was in the emergency room being worked up for these problems but he apparently left [place] because the MRI scan was not available and he would have to wait until Monday. It appears that he does have some baseline difficulties with moving around because of his Parkinson disease but he apparently is highly functional. He walks unassisted and drives. No apparent cognitive impairments are reported. The patient was brought back to the emergency room by his daughter and other children because of significantly worsened dysarthria, double weakness and this severe gait impairment. The patient sees Dr. Rob Hickman in [place] as his primary neurologist. [name] for several years as he has been diagnosed with Parkinson disease for several years. The patient reports that his left side is more symptomatic. He also seemed to  have difficulties with movements of the left leg presumed due to the Parkinson disease. The children report that they have been concerned about the patient having what appears to be witnessed apneas while sleeping. The review of systems is otherwise negative. No chest pain, shortness of breath or focal weakness as reported. No numbness. No GI or GU symptoms.  GENERAL: Pleasant male in no acute distress.  HEENT: Supple. Atraumatic normocephalic. Left pupil is a 5 mm irregular shaped and nonreactive status post surgery. The right is 4 mm and reactive.  ABDOMEN: soft  EXTREMITIES: No edema   BACK: Normal.  SKIN: Normal by inspection.    MENTAL STATUS: Alert and oriented. The language and cognition are generally intact. Judgment and insight slightly limited. The patient has severe dysarthria. He does follow commands well. Age and the month are stated appropriately.  CRANIAL NERVES: Extra ocular movements are full, there is no significant nystagmus; visual fields are full; there is complete paralysis of the right lower facial muscle; upper facial muscles and left-sided are normal; tongue protrudes slightly to the right; uvula is midline; shoulder elevation is normal. No visual extinction to double simultaneous stimulation.   MOTOR: Normal tone, bulk and strength; no pronator drift. No drift of the upper and lower extremities.  COORDINATION: Left finger to nose is normal, right finger to nose is normal. Heel-to-shin is normal bilaterally. There is near continuous low amplitude high frequency rest tremor involving the left hand. There is marked postural tremor left upper extremity mostly a moderate frequency moderate amplitude.   REFLEXES: Deep tendon reflexes are symmetrical and normal. Babinski reflexes are flexor bilaterally.   SENSATION: Normal to light touch. No neglect observed.   NIH  stroke scale 4.   The brain MRI is reviewed in person. There is an acute infarcts seen involving the  inferior lateral aspect of the left thalamic region extending to the internal capsule and the midbrain. This is seen on diffusion imaging. There may be slightly reduced signal seen on the ADC scan. There appears to be an old lacunar infarct involving the left basal ganglia. There is rather pronounced ventriculomegaly which seems out of proportion to the degree of atrophy. There is minimal periventricular and deep white matter leukoencephalopathy.       Blood pressure 171/80, pulse 74, temperature 98 F (36.7 C), temperature source Oral, resp. rate 18, height _0  (1.727 m), weight 83.3 kg (183 lb 10.3 oz), SpO2 98 %.  Past Medical History  Diagnosis Date  . Parkinson's disease   . COPD (chronic obstructive pulmonary disease)   . Tobacco abuse   . Alcohol use   . Depression with anxiety     Past Surgical History  Procedure Laterality Date  . Eye surgery      History reviewed. No pertinent family history.  Social History:  reports that he has been smoking.  He does not have any smokeless tobacco history on file. He reports that he drinks alcohol. He reports that he does not use illicit drugs.  Allergies: No Known Allergies  Medications: Prior to Admission medications   Medication Sig Start Date End Date Taking? Authorizing Provider  albuterol (PROVENTIL HFA;VENTOLIN HFA) 108 (90 BASE) MCG/ACT inhaler Inhale 2 puffs into the lungs every 6 (six) hours as needed for wheezing or shortness of breath. 01/16/15  Yes Estela Leonie Green, MD  aspirin EC 81 MG EC tablet Take 1 tablet (81 mg total) by mouth daily. 01/16/15  Yes Erline Hau, MD  carbidopa-levodopa (SINEMET IR) 25-250 MG per tablet Take 1 tablet by mouth 3 (three) times daily.   Yes Historical Provider, MD  clonazePAM (KLONOPIN) 0.5 MG tablet Take 0.5 mg by mouth 2 (two) times daily as needed for anxiety.   Yes Historical Provider, MD  gabapentin (NEURONTIN) 300 MG capsule Take 300 mg by mouth 3 (three) times  daily.   Yes Historical Provider, MD  Multiple Vitamin (MULTIVITAMIN WITH MINERALS) TABS tablet Take 1 tablet by mouth daily.   Yes Historical Provider, MD  PARoxetine (PAXIL) 40 MG tablet Take 40 mg by mouth every morning.   Yes Historical Provider, MD  rOPINIRole (REQUIP) 1 MG tablet Take 1 mg by mouth 2 (two) times daily.   Yes Historical Provider, MD    Scheduled Meds: . albuterol  2.5 mg Nebulization TID  . aspirin EC  325 mg Oral Daily  . carbidopa-levodopa  1 tablet Oral TID  . gabapentin  300 mg Oral TID  . heparin  5,000 Units Subcutaneous 3 times per day  . multivitamin with minerals  1 tablet Oral Daily  . nicotine  21 mg Transdermal Daily  . [START ON 01/20/2015] PARoxetine  40 mg Oral [id number]  . rOPINIRole  1 mg Oral BID  . thiamine  250 mg Oral Daily   Continuous Infusions: . 0.9 % NaCl with KCl 20 mEq / L 70 mL/hr at 01/19/15 1805   PRN Meds:.acetaminophen, albuterol, clonazePAM, senna-docusate     Results for orders placed or performed during the hospital encounter of 01/19/15 (from the past 48 hour(s))  Ethanol     Status: None   Collection Time: 01/19/15 10:56 AM  Result Value Ref Range   Alcohol,  Ethyl (B) <5 <5 mg/dL    Comment:        LOWEST DETECTABLE LIMIT FOR SERUM ALCOHOL IS 11 mg/dL FOR MEDICAL PURPOSES ONLY   Protime-INR     Status: None   Collection Time: 01/19/15 10:56 AM  Result Value Ref Range   Prothrombin Time 13.7 11.6 - 15.2 seconds   INR 1.03 0.00 - 1.49  APTT     Status: None   Collection Time: 01/19/15 10:56 AM  Result Value Ref Range   aPTT 27 24 - 37 seconds  CBC     Status: None   Collection Time: 01/19/15 10:56 AM  Result Value Ref Range   WBC 9.7 4.0 - 10.5 K/uL   RBC 4.78 4.22 - 5.81 MIL/uL   Hemoglobin 15.1 13.0 - 17.0 g/dL   HCT 45.0 39.0 - 52.0 %   MCV 94.1 78.0 - 100.0 fL   MCH 31.6 26.0 - 34.0 pg   MCHC 33.6 30.0 - 36.0 g/dL   RDW 13.4 11.5 - 15.5 %   Platelets 231 150 - 400 K/uL  Differential     Status: None    Collection Time: 01/19/15 10:56 AM  Result Value Ref Range   Neutrophils Relative % 71 43 - 77 %   Neutro Abs 6.9 1.7 - 7.7 K/uL   Lymphocytes Relative 17 12 - 46 %   Lymphs Abs 1.7 0.7 - 4.0 K/uL   Monocytes Relative 8 3 - 12 %   Monocytes Absolute 0.8 0.1 - 1.0 K/uL   Eosinophils Relative 3 0 - 5 %   Eosinophils Absolute 0.3 0.0 - 0.7 K/uL   Basophils Relative 1 0 - 1 %   Basophils Absolute 0.1 0.0 - 0.1 K/uL  Comprehensive metabolic panel     Status: Abnormal   Collection Time: 01/19/15 10:56 AM  Result Value Ref Range   Sodium 140 135 - 145 mmol/L   Potassium 3.6 3.5 - 5.1 mmol/L   Chloride 109 101 - 111 mmol/L   CO2 24 22 - 32 mmol/L   Glucose, Bld 139 (H) 65 - 99 mg/dL   BUN 17 6 - 20 mg/dL   Creatinine, Ser 0.95 0.61 - 1.24 mg/dL   Calcium 8.6 (L) 8.9 - 10.3 mg/dL   Total Protein 6.7 6.5 - 8.1 g/dL   Albumin 3.8 3.5 - 5.0 g/dL   AST 21 15 - 41 U/L   ALT 5 (L) 17 - 63 U/L   Alkaline Phosphatase 87 38 - 126 U/L   Total Bilirubin 0.8 0.3 - 1.2 mg/dL   GFR calc non Af Amer >60 >60 mL/min   GFR calc Af Amer >60 >60 mL/min    Comment: (NOTE) The eGFR has been calculated using the CKD EPI equation. This calculation has not been validated in all clinical situations. eGFR's persistently <60 mL/min signify possible Chronic Kidney Disease.    Anion gap 7 5 - 15  Troponin I     Status: None   Collection Time: 01/19/15 10:56 AM  Result Value Ref Range   Troponin I <0.03 <0.031 ng/mL    Comment:        NO INDICATION OF MYOCARDIAL INJURY.   Urine Drug Screen     Status: None   Collection Time: 01/19/15 11:30 AM  Result Value Ref Range   Opiates NONE DETECTED NONE DETECTED   Cocaine NONE DETECTED NONE DETECTED   Benzodiazepines NONE DETECTED NONE DETECTED   Amphetamines NONE DETECTED NONE DETECTED   Tetrahydrocannabinol  NONE DETECTED NONE DETECTED   Barbiturates NONE DETECTED NONE DETECTED    Comment:        DRUG SCREEN FOR MEDICAL PURPOSES ONLY.  IF CONFIRMATION IS  NEEDED FOR ANY PURPOSE, NOTIFY LAB WITHIN 5 DAYS.        LOWEST DETECTABLE LIMITS FOR URINE DRUG SCREEN Drug Class       Cutoff (ng/mL) Amphetamine      1000 Barbiturate      200 Benzodiazepine   224 Tricyclics       825 Opiates          300 Cocaine          300 THC              50   Urinalysis, Routine w reflex microscopic     Status: Abnormal   Collection Time: 01/19/15 11:30 AM  Result Value Ref Range   Color, Urine YELLOW YELLOW   APPearance CLEAR CLEAR   Specific Gravity, Urine 1.020 1.005 - 1.030   pH 6.0 5.0 - 8.0   Glucose, UA NEGATIVE NEGATIVE mg/dL   Hgb urine dipstick NEGATIVE NEGATIVE   Bilirubin Urine NEGATIVE NEGATIVE   Ketones, ur TRACE (A) NEGATIVE mg/dL   Protein, ur NEGATIVE NEGATIVE mg/dL   Urobilinogen, UA 0.2 0.0 - 1.0 mg/dL   Nitrite NEGATIVE NEGATIVE   Leukocytes, UA NEGATIVE NEGATIVE    Comment: MICROSCOPIC NOT DONE ON URINES WITH NEGATIVE PROTEIN, BLOOD, LEUKOCYTES, NITRITE, OR GLUCOSE <1000 mg/dL.    Studies/Results:     Estelle Skibicki A. Merlene Laughter, M.D.  Diplomate, Tax adviser of Psychiatry and Neurology ( Neurology). 01/19/2015, 7:14 PM

## 2015-01-19 NOTE — H&P (Signed)
[facility] History and Physical  Lucas [name] QPR:[id number] DOB: 1946/07/17 DOA: 01/19/2015  Referring physician: ED physician, Dr. Alvino Chapel PCP: No PCP Per Patient  NEUROLOGIST: Lucas [name] in [place]  Chief Complaint: Slurred speech, falls, generalized weakness.  HPI: Lucas [name] is a 69 y.o. male with a history of Parkinson's disease, tobacco abuse, and daily alcohol use-without alcoholism per family. He was recently hospitalized from 01/15/15 through 01/16/15 for evaluation of dysarthria. CT of his head at that time revealed small remote lacunar infarcts involving the lateral basal ganglia, but no evidence of an acute infarction. The plan was for an outpatient MRI. Apparently, the patient showed up for the MRI on 01/18/15, but left AMA before it was done. He returns at the admonition of his daughter, Lucas [name]. The patient continues to have slurred speech, difficulty walking, and generalized weakness. Lucas [name] reports that he had a couple falls at home, but the patient denies this. He has chronic tremor of both hands from Parkinson's disease. Patient denies visual changes, difficulty swallowing his food, dizziness, numbness of his face or extremities, chest pain, palpitations, nausea, vomiting, lower extremity swelling, blood in his stools, and pain with urination. He does have occasional wheezes and shortness of breath. He did have some incontinent of his bladder this morning, primarily because he could not get to the bathroom in time.  In the ED, he was afebrile and hemodynamically stable. His lab data were significant for glucose 139. His chest x-ray revealed no active cardiopulmonary disease. MRI of his brain revealed an acute 10 mm nonhemorrhagic infarct involving the inferior left thalamus extending into the left cerebral pedicle. He is being admitted for further evaluation and management.     Review of Systems:  As above in history present illness; otherwise negative.  Past  Medical History  Diagnosis Date  . Parkinson's disease   . COPD (chronic obstructive pulmonary disease)   . Tobacco abuse   . Alcohol use   . Depression with anxiety    Past Surgical History  Procedure Laterality Date  . Eye surgery     Social History: He is divorced. He lives in [place]. He has 3 children. His daughter Lucas [name] is here today with him. He smokes one pack of cigarettes per day. He drinks 2 beers per day. He denies illicit drug use. He still drives.   No Known Allergies  Family history: His mother died of old age. His father died of COPD.  Prior to Admission medications   Medication Sig Start Date End Date Taking? Authorizing Provider  albuterol (PROVENTIL HFA;VENTOLIN HFA) 108 (90 BASE) MCG/ACT inhaler Inhale 2 puffs into the lungs every 6 (six) hours as needed for wheezing or shortness of breath. 01/16/15  Yes Estela Leonie Green, MD  aspirin EC 81 MG EC tablet Take 1 tablet (81 mg total) by mouth daily. 01/16/15  Yes Erline Hau, MD  carbidopa-levodopa (SINEMET IR) 25-250 MG per tablet Take 1 tablet by mouth 3 (three) times daily.   Yes Historical Provider, MD  clonazePAM (KLONOPIN) 0.5 MG tablet Take 0.5 mg by mouth 2 (two) times daily as needed for anxiety.   Yes Historical Provider, MD  gabapentin (NEURONTIN) 300 MG capsule Take 300 mg by mouth 3 (three) times daily.   Yes Historical Provider, MD  Multiple Vitamin (MULTIVITAMIN WITH MINERALS) TABS tablet Take 1 tablet by mouth daily.   Yes Historical Provider, MD  PARoxetine (PAXIL) 40 MG tablet Take 40 mg by mouth every morning.  Yes Historical Provider, MD  rOPINIRole (REQUIP) 1 MG tablet Take 1 mg by mouth 2 (two) times daily.   Yes Historical Provider, MD   Physical Exam: Filed Vitals:   01/19/15 1318 01/19/15 1330 01/19/15 1425 01/19/15 1517  BP: 115/82 125/88  102/74  Pulse: 71 68  73  Temp:   98.8 F (37.1 C)   TempSrc:   Oral   Resp: 18 21  17   Height:      Weight:      SpO2: 98%  96%  97%    Wt Readings from Last 3 Encounters:  01/19/15 79.833 kg (176 lb)  01/16/15 79.9 kg (176 lb 2.4 oz)    General:  Appears calm and comfortable; obese 69 year old Caucasian man in no acute distress. Eyes: PERRL, normal lids, irises & conjunctiva; sclerae are white conjunctivae are clear. ENT: grossly normal hearing; right tongue deviation; mucous membranes are mildly dry. Neck: no LAD, masses or thyromegaly; right carotid bruit versus radiating murmur. Cardiovascular: S1, S2, with a 2/6 systolic murmur with radiation to the right greater than left carotid arteries.  Telemetry: SR, no arrhythmias  Respiratory: Occasional wheezes auscultated bilaterally; breathing nonlabored. Abdomen: soft, ntnd; positive bowel sounds; obese, no hepatosplenomegaly. Skin: no rash or induration seen on limited exam Musculoskeletal: grossly normal tone BUE/BLE; no acute hot red joints. Psychiatric: Flat affect. Alert and oriented 3. Neurologic: He has moderate dysarthria and a mild right facial droop; right tongue deviation; no pronator drift. Strength of the upper extremities is 5 over 5. Strength of the right lower extremity is 5 over 5 in strength of the left lower extremity is 4 minus over 5. Sensation to cold touch grossly intact on the extremities. Finger to nose bilaterally with some dysmetria. Gait not assessed. Mild pill-rolling tremor of both hands.           Labs on Admission:  Basic Metabolic Panel:  Recent Labs Lab 01/15/15 2116 01/16/15 0559 01/19/15 1056  NA 141 140 140  K 3.8 3.5 3.6  CL 108 106 109  CO2 28 24 24   GLUCOSE 116* 166* 139*  BUN 15 16 17   CREATININE 1.03 0.99 0.95  CALCIUM 8.2* 8.7* 8.6*   Liver Function Tests:  Recent Labs Lab 01/15/15 2116 01/19/15 1056  AST 18 21  ALT 12* 5*  ALKPHOS 84 87  BILITOT 0.6 0.8  PROT 6.5 6.7  ALBUMIN 3.6 3.8   No results for input(s): LIPASE, AMYLASE in the last 168 hours. No results for input(s): AMMONIA in the  last 168 hours. CBC:  Recent Labs Lab 01/15/15 2116 01/16/15 0559 01/19/15 1056  WBC 8.8 8.5 9.7  NEUTROABS 5.1  --  6.9  HGB 14.5 15.4 15.1  HCT 42.3 45.5 45.0  MCV 93.4 93.6 94.1  PLT 209 241 231   Cardiac Enzymes:  Recent Labs Lab 01/15/15 2116 01/19/15 1056  TROPONINI <0.03 <0.03    BNP (last 3 results)  Recent Labs  01/15/15 2116  BNP 35.0    ProBNP (last 3 results) No results for input(s): PROBNP in the last 8760 hours.  CBG: No results for input(s): GLUCAP in the last 168 hours.  Radiological Exams on Admission: Dg Chest 2 View  01/19/2015   CLINICAL DATA:  Dizziness, Parkinson's disease  EXAM: CHEST  2 VIEW  COMPARISON:  01/15/2015  FINDINGS: Cardiomediastinal silhouette is stable. No acute infiltrate or pleural effusion. Probable chronic mild interstitial prominence is stable. Mild degenerative changes lower thoracic spine.  IMPRESSION: No active cardiopulmonary  disease.  No significant change.   Electronically Signed   By: Lahoma Crocker M.D.   On: 01/19/2015 12:14   Mr Jodene Nam Head Wo Contrast  01/19/2015   CLINICAL DATA:  Acute infarct left thalamus. Weakness. Abnormal speech.  EXAM: MRA HEAD WITHOUT CONTRAST  TECHNIQUE: Angiographic images of the Circle of Willis were obtained using MRA technique without intravenous contrast.  COMPARISON:  MRI brain from the same day.  FINDINGS: Mild atherosclerotic irregularities are present within the cavernous internal carotid arteries without a significant stenosis from the high cervical segments through the ICA termini bilaterally. The proximal A1 segments are normal bilaterally. Signal loss is present in the distal right A1. The right M1 segment is visualized to the bifurcation. There is significant signal loss proximally in the left M1 segment. ACA and MCA branch vessels are not visualized. There is significant motion in these regions.  The right vertebral artery is the dominant vessel. Neither PICA origin is visualized. There  is narrowing of the proximal left vertebral artery. The vertebrobasilar junction is intact. The basilar artery demonstrates mild narrowing distally. Both posterior cerebral arteries originate from the basilar tip with prominent posterior communicating arteries bilaterally. PCA branch vessels are obscured.  IMPRESSION: 1. Atherosclerotic changes proximally without a significant proximal stenosis, aneurysm, or branch vessel occlusion proximal to the ICA terminus or mid basilar artery. 2. The more distal vessels are significantly attenuated. Extensive motion artifact likely contributes. The patient is nondiagnostic for branch vessel disease. The could be repeated or CTA be performed if further evaluation is desired. This would optimally be performed when the patient is able to hold still.   Electronically Signed   By: San Morelle M.D.   On: 01/19/2015 15:28   Mr Brain Wo Contrast  01/19/2015   CLINICAL DATA:  Dizziness. Abnormal CT head. Abnormal speech and gait. Generalized weakness.  EXAM: MRI HEAD WITHOUT CONTRAST  TECHNIQUE: Multiplanar, multiecho pulse sequences of the brain and surrounding structures were obtained without intravenous contrast.  COMPARISON:  CT of the head 01/15/2015  FINDINGS: Diffusion-weighted images demonstrate an acute nonhemorrhagic infarct measuring 10 x 10 x 6 mm involving the inferior left thalamus and extending along the cortical spinal tracts into the left cerebral peduncle.  T2 changes are associated with. The acute infarct. Moderate prominence of the ventricles is stable. The fourth ventricle is mildly enlarged is well.  A remote punctate lacunar infarct is seen along the inferior aspect of the right cerebellum. There is remote lacunar infarcts within the right thalamus.  Flow is present in the major intracranial arteries. Mild diffuse periventricular white matter changes are present bilaterally. Bilateral lens replacements are noted.  Fluid levels are present in the  maxillary sinuses bilaterally. The remaining paranasal sinuses are clear. There is fluid in left mastoid air cells. No obstructing nasopharyngeal lesion is evident. Midline structures normal.  IMPRESSION: 1. Acute 10 mm nonhemorrhagic infarct involving the inferior left thalamus extending into the left cerebral peduncle. 2. Stable diffuse ventricular dilation without evidence for acute hydrocephalus. 3. Mild white matter disease. This likely reflects the sequela of chronic microvascular ischemia. 4. Bilateral maxillary sinus disease. 5. Left mastoid effusion. No obstructing nasopharyngeal lesion is evident. These results were called by telephone at the time of interpretation on 01/19/2015 at 3:25 pm to Dr. Nat Christen, who verbally acknowledged these results.   Electronically Signed   By: San Morelle M.D.   On: 01/19/2015 15:25    EKG: Independently reviewed. Normal sinus rhythm with a heart  rate of 77 bpm and no significant ST or T-wave abnormalities.  Assessment/Plan Principal Problem:   Acute ischemic stroke Active Problems:   Dysarthria   Facial droop   Parkinson's disease   Tobacco abuse   Alcohol use   Obesity   Depression with anxiety   Hyperglycemia   1. Acute ischemic stroke involving the inferior left thalamus and left cerebral peduncle. The patient's symptoms started prior to 01/15/15. He did present to the hospital and was hospitalized from 5/21-5/22/2016 for workup of dysarthria. CT of his head revealed small remote lacunar infarcts, but no acute stroke. He was discharged on aspirin. Outpatient MRI was scheduled, but the patient left AMA on 01/18/15 before was done. In the ED, during this admission, MRI of the brain was ordered and it confirmed an acute left thalamic stroke. We will continue aspirin, but change it to 325 mg daily. Although he passed the bedside swallow evaluation, will order a dysphagia 2 diet and get speech therapist's input. Will order a 2-D echocardiogram and  carotid ultrasound. Will consult neurology for further recommendations. Will consult OT and PT as well. 2. Dysarthria and right facial droop secondary to #1. 3. Parkinson's disease. Stable. We'll continue Sinemet and Requip. 4. Tobacco abuse with probable COPD. The patient was treated with Solu-Medrol and nebulizers during the previous hospitalization. Currently, he has mild wheezes. Will order albuterol nebulizer every 8 hours. The patient was advised to stop smoking. Will order tobacco cessation counseling. 5. Alcohol use. The patient drinks 2 beers totaling 24 ounces daily. His daughter does not endorse alcoholism. Will order thiamine daily. 6. Hyperglycemia. This is mild. Will order hemoglobin A1c for further evaluation and continue to monitor. Will give gentle maintenance IV fluids. 7. Depression with anxiety. Currently stable. We'll continue Klonopin and Paxil.    Code Status: Full code DVT Prophylaxis: Subcutaneous heparin Family Communication: Discussed with daughter, Lucas [name] Disposition Plan: Anticipate discharge in 24-48 hours.  Time spent: One hour  [facility] [phone]

## 2015-01-20 ENCOUNTER — Inpatient Hospital Stay (HOSPITAL_COMMUNITY): Payer: Medicare Other

## 2015-01-20 ENCOUNTER — Encounter (HOSPITAL_COMMUNITY): Payer: Self-pay | Admitting: Internal Medicine

## 2015-01-20 DIAGNOSIS — I6350 Cerebral infarction due to unspecified occlusion or stenosis of unspecified cerebral artery: Secondary | ICD-10-CM

## 2015-01-20 DIAGNOSIS — G9389 Other specified disorders of brain: Secondary | ICD-10-CM

## 2015-01-20 DIAGNOSIS — I679 Cerebrovascular disease, unspecified: Secondary | ICD-10-CM

## 2015-01-20 HISTORY — DX: Other specified disorders of brain: G93.89

## 2015-01-20 LAB — BASIC METABOLIC PANEL
Anion gap: 8 (ref 5–15)
BUN: 13 mg/dL (ref 6–20)
CHLORIDE: 107 mmol/L (ref 101–111)
CO2: 26 mmol/L (ref 22–32)
CREATININE: 0.86 mg/dL (ref 0.61–1.24)
Calcium: 8.3 mg/dL — ABNORMAL LOW (ref 8.9–10.3)
GFR calc Af Amer: >60 mL/min (ref >60)
Glucose, Bld: 101 mg/dL — ABNORMAL HIGH (ref 65–99)
Potassium: 3.7 mmol/L (ref 3.5–5.1)
Sodium: 141 mmol/L (ref 135–145)

## 2015-01-20 LAB — LIPID PANEL
Cholesterol: 156 mg/dL (ref 0–200)
HDL: 36 mg/dL — ABNORMAL LOW (ref >40)
LDL Cholesterol: 90 mg/dL (ref 0–99)
Total CHOL/HDL Ratio: 4.3 RATIO
Triglycerides: 150 mg/dL — ABNORMAL HIGH (ref <150)
VLDL: 30 mg/dL (ref 0–40)

## 2015-01-20 LAB — CBC
HCT: 41.2 % (ref 39.0–52.0)
Hemoglobin: 13.7 g/dL (ref 13.0–17.0)
MCH: 31.6 pg (ref 26.0–34.0)
MCHC: 33.3 g/dL (ref 30.0–36.0)
MCV: 94.9 fL (ref 78.0–100.0)
Platelets: 209 10*3/uL (ref 150–400)
RBC: 4.34 MIL/uL (ref 4.22–5.81)
RDW: 13.4 % (ref 11.5–15.5)
WBC: 7.5 10*3/uL (ref 4.0–10.5)

## 2015-01-20 LAB — VITAMIN B12: VITAMIN B 12: 545 pg/mL (ref 180–914)

## 2015-01-20 MED ORDER — ATORVASTATIN CALCIUM 10 MG PO TABS
10.0000 mg | ORAL_TABLET | Freq: Every day | ORAL | Status: DC
  Administered 2015-01-20: 10 mg via ORAL
  Filled 2015-01-20: qty 1

## 2015-01-20 MED ORDER — MOMETASONE FURO-FORMOTEROL FUM 100-5 MCG/ACT IN AERO
2.0000 | INHALATION_SPRAY | Freq: Two times a day (BID) | RESPIRATORY_TRACT | Status: DC
  Administered 2015-01-20 – 2015-01-21 (×2): 2 via RESPIRATORY_TRACT
  Filled 2015-01-20: qty 8.8

## 2015-01-20 NOTE — Care Management Note (Signed)
Case Management Note  Patient Details  Name: Lucas [name] MRN: [id number] Date of Birth: 1945-09-28  Subjective/Objective:                  Pt admitted from home with CVA. Pt lives alone but pts daughter is going to be staying with pt at discharge. Pt is independent with ADL's.  Action/Plan: Will arrange sleep study as recommended by neuro. No other CM needs noted. Pts daughter stated she is in the process of securing PCP for pt.  Expected Discharge Date:  01/22/15               Expected Discharge Plan:  Home/Self Care  In-House Referral:  NA  Discharge planning Services  CM Consult  Post Acute Care Choice:  NA Choice offered to:  NA  DME Arranged:    DME Agency:     HH Arranged:    HH Agency:     Status of Service:  Completed, signed off  Medicare Important Message Given:    Date Medicare IM Given:    Medicare IM give by:    Date Additional Medicare IM Given:    Additional Medicare Important Message give by:     If discussed at [place] of Stay Meetings, dates discussed:    Additional Comments:  Joylene Draft, RN 01/20/2015, 2:20 PM

## 2015-01-20 NOTE — Progress Notes (Signed)
UR chart review completed.  

## 2015-01-20 NOTE — Progress Notes (Signed)
[facility] PROGRESS NOTE  Lucas [name] XIP:[id number] DOB: 09/27/1945 DOA: 01/19/2015 PCP: No PCP Per Patient    Code Status: Full code Family Communication: Discussed with patient; family not available. (Discussed with daughter on 01/19/15) Disposition Plan: Discharge when clinically appropriate, likely in the next 24 hours.   Consultants:  Neurology, Dr. Merlene Laughter  Procedures:  2-D echocardiogram 01/20/15, pending.  Antibiotics:  None  HPI/Subjective: The patient believes he is speaking better than he did yesterday. He states that his dentures are interfering with his speech as well. He denies headache, dizziness, difficulty chewing, or difficulty swallowing.  Objective: Filed Vitals:   01/20/15 1012  BP: 144/85  Pulse: 70  Temp:   Resp: 18   temperature 97.9. Oxygen saturation 95% on room air.  Intake/Output Summary (Last 24 hours) at 01/20/15 1406 Last data filed at 01/20/15 1325  Gross per 24 hour  Intake  871.5 ml  Output    500 ml  Net  371.5 ml   Filed Weights   01/19/15 1038 01/19/15 1600  Weight: 79.833 kg (176 lb) 83.3 kg (183 lb 10.3 oz)    Exam:   General:  69 year old Caucasian man in no acute distress.  Cardiovascular: S1, S2, with a 2/6 systolic murmur with radiation to the right greater than left carotid arteries.  Respiratory: Occasional wheezes; no crackles; breathing nonlabored.  Abdomen: Obese, positive bowel sounds, soft, nontender, nondistended.  Musculoskeletal/extremities: No acute hot red joints. Pedal pulses palpable. No pedal edema.  Neurologic: He is alert and oriented 3. He has right sided facial droop. He has dysarthria-slightly less than yesterday. He has tongue deviation to the right. Strength of the upper extremities is 5 over 5 bilaterally. Strength of the right lower extremity is 5 over 5 in strength of the left lower extremity is 4 minus over 5. He has a pin rolling tremor of his hands, left greater than  right.  Data Reviewed: Basic Metabolic Panel:  Recent Labs Lab 01/15/15 2116 01/16/15 0559 01/19/15 1056 01/20/15 0630  NA 141 140 140 141  K 3.8 3.5 3.6 3.7  CL 108 106 109 107  CO2 28 24 24 26   GLUCOSE 116* 166* 139* 101*  BUN 15 16 17 13   CREATININE 1.03 0.99 0.95 0.86  CALCIUM 8.2* 8.7* 8.6* 8.3*   Liver Function Tests:  Recent Labs Lab 01/15/15 2116 01/19/15 1056  AST 18 21  ALT 12* 5*  ALKPHOS 84 87  BILITOT 0.6 0.8  PROT 6.5 6.7  ALBUMIN 3.6 3.8   No results for input(s): LIPASE, AMYLASE in the last 168 hours. No results for input(s): AMMONIA in the last 168 hours. CBC:  Recent Labs Lab 01/15/15 2116 01/16/15 0559 01/19/15 1056 01/20/15 0630  WBC 8.8 8.5 9.7 7.5  NEUTROABS 5.1  --  6.9  --   HGB 14.5 15.4 15.1 13.7  HCT 42.3 45.5 45.0 41.2  MCV 93.4 93.6 94.1 94.9  PLT 209 241 231 209   Cardiac Enzymes:  Recent Labs Lab 01/15/15 2116 01/19/15 1056  TROPONINI <0.03 <0.03   BNP (last 3 results)  Recent Labs  01/15/15 2116  BNP 35.0    ProBNP (last 3 results) No results for input(s): PROBNP in the last 8760 hours.  CBG: No results for input(s): GLUCAP in the last 168 hours.  No results found for this or any previous visit (from the past 240 hour(s)).   Studies: Ct Angio Head W/cm &/or Wo Cm  01/19/2015   CLINICAL DATA:  Acute  infarct of the left thalamus.  EXAM: CT ANGIOGRAPHY HEAD  TECHNIQUE: Multidetector CT imaging of the head was performed using the standard protocol during bolus administration of intravenous contrast. Multiplanar CT image reconstructions and MIPs were obtained to evaluate the vascular anatomy.  CONTRAST:  25mL OMNIPAQUE IOHEXOL 350 MG/ML SOLN  COMPARISON:  MRI brain from the same day.  FINDINGS: CT HEAD  Brain: The acute nonhemorrhagic infarct in the left thalamus is again noted. A remote lacunar infarct in the basal ganglia is again seen. Ventricular dilation is stable  Calvarium and skull base: The calvarium is  intact.  Paranasal sinuses: Fluid levels are present in the maxillary sinuses bilaterally. Mild mucosal thickening is present in the sphenoid sinuses. There is fluid in left mastoid air cells. No obstructing nasopharyngeal lesion is evident.  Orbits: Bilateral lens replacements are present. The globes and orbits are otherwise intact.  CTA HEAD  Anterior circulation: Atherosclerotic irregularity is more prominent in the right than left cavernous internal carotid artery. There is no significant stenosis. The A1 and M1 segments are normal. The anterior communicating artery is patent. The high cervical internal carotid arteries are normal bilaterally.  A moderate stenosis is present in the proximal left M1 segment. A more high-grade stenosis is present in the distal left M1 segment with marked attenuation of residual left PCA MCA branch vessels. Prominent collateral vessels are noted. The right MCA bifurcation is intact. There is moderate attenuation see branch vessels diffusely without a proximal stenosis. The  Posterior circulation: The right vertebral artery is the dominant vessel. PICA origins are not visualized. Dominant AICA vessels are seen bilaterally. There is mild attenuation of basilar artery diffusely. Posterior cerebral arteries originate from P1 segments and posterior communicating arteries bilaterally. There is moderate attenuation of distal PCA branch vessels.  Venous sinuses: The dural sinuses are patent.  Anatomic variants: Prominent posterior communicating arteries bilaterally.  Delayed phase:Delayed images demonstrate no pathologic enhancement.  IMPRESSION: 1. Acute/subacute lab infarct is stable. 2. Stable ventricular enlargement. 3. Moderate proximal left M1 and high-grade distal left disc stenosis. 4. Marked attenuation of left MCA branch vessels with prominent collateral vessels noted. 5. Diffuse small vessel disease noted.   Electronically Signed   By: San Morelle M.D.   On: 01/19/2015  20:55   Dg Chest 2 View  01/19/2015   CLINICAL DATA:  Dizziness, Parkinson's disease  EXAM: CHEST  2 VIEW  COMPARISON:  01/15/2015  FINDINGS: Cardiomediastinal silhouette is stable. No acute infiltrate or pleural effusion. Probable chronic mild interstitial prominence is stable. Mild degenerative changes lower thoracic spine.  IMPRESSION: No active cardiopulmonary disease.  No significant change.   Electronically Signed   By: Lahoma Crocker M.D.   On: 01/19/2015 12:14   Mr Jodene Nam Head Wo Contrast  01/19/2015   CLINICAL DATA:  Acute infarct left thalamus. Weakness. Abnormal speech.  EXAM: MRA HEAD WITHOUT CONTRAST  TECHNIQUE: Angiographic images of the Circle of Willis were obtained using MRA technique without intravenous contrast.  COMPARISON:  MRI brain from the same day.  FINDINGS: Mild atherosclerotic irregularities are present within the cavernous internal carotid arteries without a significant stenosis from the high cervical segments through the ICA termini bilaterally. The proximal A1 segments are normal bilaterally. Signal loss is present in the distal right A1. The right M1 segment is visualized to the bifurcation. There is significant signal loss proximally in the left M1 segment. ACA and MCA branch vessels are not visualized. There is significant motion in these regions.  The right vertebral artery is the dominant vessel. Neither PICA origin is visualized. There is narrowing of the proximal left vertebral artery. The vertebrobasilar junction is intact. The basilar artery demonstrates mild narrowing distally. Both posterior cerebral arteries originate from the basilar tip with prominent posterior communicating arteries bilaterally. PCA branch vessels are obscured.  IMPRESSION: 1. Atherosclerotic changes proximally without a significant proximal stenosis, aneurysm, or branch vessel occlusion proximal to the ICA terminus or mid basilar artery. 2. The more distal vessels are significantly attenuated. Extensive  motion artifact likely contributes. The patient is nondiagnostic for branch vessel disease. The could be repeated or CTA be performed if further evaluation is desired. This would optimally be performed when the patient is able to hold still.   Electronically Signed   By: San Morelle M.D.   On: 01/19/2015 15:28   Mr Brain Wo Contrast  01/19/2015   CLINICAL DATA:  Dizziness. Abnormal CT head. Abnormal speech and gait. Generalized weakness.  EXAM: MRI HEAD WITHOUT CONTRAST  TECHNIQUE: Multiplanar, multiecho pulse sequences of the brain and surrounding structures were obtained without intravenous contrast.  COMPARISON:  CT of the head 01/15/2015  FINDINGS: Diffusion-weighted images demonstrate an acute nonhemorrhagic infarct measuring 10 x 10 x 6 mm involving the inferior left thalamus and extending along the cortical spinal tracts into the left cerebral peduncle.  T2 changes are associated with. The acute infarct. Moderate prominence of the ventricles is stable. The fourth ventricle is mildly enlarged is well.  A remote punctate lacunar infarct is seen along the inferior aspect of the right cerebellum. There is remote lacunar infarcts within the right thalamus.  Flow is present in the major intracranial arteries. Mild diffuse periventricular white matter changes are present bilaterally. Bilateral lens replacements are noted.  Fluid levels are present in the maxillary sinuses bilaterally. The remaining paranasal sinuses are clear. There is fluid in left mastoid air cells. No obstructing nasopharyngeal lesion is evident. Midline structures normal.  IMPRESSION: 1. Acute 10 mm nonhemorrhagic infarct involving the inferior left thalamus extending into the left cerebral peduncle. 2. Stable diffuse ventricular dilation without evidence for acute hydrocephalus. 3. Mild white matter disease. This likely reflects the sequela of chronic microvascular ischemia. 4. Bilateral maxillary sinus disease. 5. Left mastoid  effusion. No obstructing nasopharyngeal lesion is evident. These results were called by telephone at the time of interpretation on 01/19/2015 at 3:25 pm to Dr. Nat Christen, who verbally acknowledged these results.   Electronically Signed   By: San Morelle M.D.   On: 01/19/2015 15:25   US Carotid Bilateral  01/20/2015   CLINICAL DATA:  Acute ischemic CVA  EXAM: BILATERAL CAROTID DUPLEX ULTRASOUND  TECHNIQUE: Pearline Cables scale imaging, color Doppler and duplex ultrasound were performed of bilateral carotid and vertebral arteries in the neck.  COMPARISON:  None.  FINDINGS: Criteria: Quantification of carotid stenosis is based on velocity parameters that correlate the residual internal carotid diameter with NASCET-based stenosis levels, using the diameter of the distal internal carotid lumen as the denominator for stenosis measurement.  The following peak systolic velocity measurements were obtained:  RIGHT  ICA:  64 cm/sec  CCA:  97 cm/sec  SYSTOLIC ICA/CCA RATIO:  0.7  DIASTOLIC ICA/CCA RATIO:  1.0  ECA:  107 cm/sec  LEFT  ICA:  54 cm/sec  CCA:  175 cm/sec  SYSTOLIC ICA/CCA RATIO:  0.4  DIASTOLIC ICA/CCA RATIO:  0.9  ECA:  133 cm/sec  RIGHT CAROTID ARTERY: There is diffuse intimal thickening on the right. There is  decreased echogenicity plaque at the end origin of the right internal carotid artery causing approximately 30-35% diameter stenosis. No stenosis of 50% diameter greater is seen by real-time interrogation on the right.  RIGHT VERTEBRAL ARTERY:  Flow is antegrade.  LEFT CAROTID ARTERY: There is mild generalized intimal thickening. There is slight plaque at the bifurcation causing approximately 15-20% diameter stenosis. No stenosis approaching 50% diameter is seen on the left.  LEFT VERTEBRAL ARTERY:  Flow is antegrade.  IMPRESSION: Relatively mild plaque formation bilaterally without stenosis approaching 50% diameter in either carotid distribution. Carotid waveforms do not show evidence of hemodynamically  significant obstruction on either side. Flow in both vertebral arteries is in the anatomic direction.   Electronically Signed   By: Lowella Grip III M.D.   On: 01/20/2015 09:40    Scheduled Meds: . albuterol  2.5 mg Nebulization TID  . aspirin EC  325 mg Oral Daily  . carbidopa-levodopa  1 tablet Oral TID  . gabapentin  300 mg Oral TID  . heparin  5,000 Units Subcutaneous 3 times per day  . multivitamin with minerals  1 tablet Oral Daily  . nicotine  21 mg Transdermal Daily  . PARoxetine  40 mg Oral [id number]  . rOPINIRole  1 mg Oral BID  . thiamine  250 mg Oral Daily   Continuous Infusions: . 0.9 % NaCl with KCl 20 mEq / L 70 mL/hr at 01/20/15 0846   Assessment and plan:  Principal Problem:   Acute ischemic stroke Active Problems:   Dysarthria   Facial droop   Cerebral ventriculomegaly   Parkinson's disease   Tobacco abuse   Alcohol use   Obesity   Depression with anxiety   Hyperglycemia    Acute ischemic stroke involving the inferior left thalamus and left cerebral peduncle.  The patient's symptoms started prior to 01/15/15. He did present to the hospital and was hospitalized from 5/21-5/22/2016 for workup of dysarthria. CT of his head at that time revealed small remote lacunar infarcts, but no acute stroke. He was discharged on aspirin. Outpatient MRI was scheduled, but the patient left AMA on 01/18/15 before was done. In the ED, during this admission, MRI of the brain was ordered and it confirmed an acute left thalamic stroke. Dr. Merlene Laughter, neurologist, was consulted. He noted that the patient had ventriculomegaly on the MRI. He recommended possible workup for normal pressure hydrocephalus by his primary neurologist once the patient has gone through the acute stroke phase. CT angiogram of the patient's head was also ordered and it revealed high-grade proximal left M1 stenosis and diffuse cerebrovascular disease. Patient's carotid ultrasound revealed no significant ICA  stenosis bilaterally. Vitamin B12 level and TSH were within normal limits. Fasting lipid profile revealed total cholesterol 156, triglycerides 150, HDL 36, and LDL 90. Will start statin therapy with Lipitor. Homocystine level, HIV antibody, and RPR are pending. 2-D echocardiogram pending. No PT needs noted per physical therapist. Await OT evaluation and recommendations. We'll continue aspirin at 325 mg daily.  Dysarthria and right facial droop secondary to #1.  Parkinson's disease. Stable. We'll continue Sinemet and Requip.  Tobacco abuse with probable COPD. The patient was treated with Solu-Medrol and nebulizers during the previous hospitalization. For mild wheezes, he was started on albuterol nebulizer every 8 hours. He was advised to stop smoking and tobacco cessation counseling was ordered. We'll add Pulmicort.  Alcohol use. The patient drinks 2 beers totaling 24 ounces daily. His daughter did not endorse alcoholism.  Thiamine was ordered  daily.  Hyperglycemia. This was mild and could've been secondary to residual steroid therapy from the previous hospitalization. His venous glucose has normalized. Hemoglobin A1c pending.   Depression with anxiety. Currently stable. We'll continue Klonopin and Paxil.   Time spent: 35 minutes.    [facility] Pager [phone]. If 7PM-7AM, please contact night-coverage at [email or web address], password [facility] 01/20/2015, 2:06 PM  LOS: 1 day

## 2015-01-20 NOTE — Progress Notes (Signed)
Occupational Therapy Evaluation Patient Details Name: Lucas [name] MRN: [id number] DOB: [birth year] Today's Date: 01/20/2015    History of Present Illness Pt is a 69 year old male with Parkinson's disease who was admitted with an acute left thalamic infarct.  His initial symptoms involved slurred speech, difficulty walking.  He lives alone with family next door.   Clinical Impression   PTA pt lived at home alone, family lives nearby and checks on him everyday. Pt awake, alert, and oriented x4 this am. Pt is independent in B/IADLs, does have some difficulty with housework tasks and is looking into getting assistance in this area. Parkinson's does cause some difficulty with fine motor tasks, however pt reports he has learned how to deal with this and compensate for decreased smoothness of fine motor movements. No further OT services required at this time, pt is at baseline with BADLs and functional mobility tasks.     Follow Up Recommendations  No OT follow up    Equipment Recommendations  Tub/shower bench       Precautions / Restrictions Precautions Precautions: Fall      Mobility Bed Mobility Overal bed mobility: Independent                Transfers Overall transfer level: Independent                         ADL Overall ADL's : Modified independent;At baseline Eating/Feeding: Independent                   Lower Body Dressing: Modified independent                       Vision Vision Assessment?: Yes Eye Alignment: Within Functional Limits Ocular Range of Motion: Within Functional Limits Alignment/Gaze Preference: Within Defined Limits Tracking/Visual Pursuits: Decreased smoothness of horizontal tracking;Decreased smoothness of vertical tracking Saccades: Within functional limits Convergence: Within functional limits Visual Fields: No apparent deficits          Pertinent Vitals/Pain Pain Assessment: No/denies pain     Hand  Dominance Right   Extremity/Trunk Assessment Upper Extremity Assessment Upper Extremity Assessment: Overall WFL for tasks assessed   Lower Extremity Assessment Lower Extremity Assessment: Defer to PT evaluation       Communication Communication Communication: No difficulties   Cognition Arousal/Alertness: Awake/alert Behavior During Therapy: WFL for tasks assessed/performed Overall Cognitive Status: Within Functional Limits for tasks assessed                                Home Living Family/patient expects to be discharged to:: Private residence Living Arrangements: Alone Available Help at Discharge: Family;Available PRN/intermittently Type of Home: House             Bathroom Shower/Tub: Tub/shower unit         Home Equipment: Environmental consultant - 2 wheels;Cane - single point          Prior Functioning/Environment Level of Independence: Independent with assistive device(s)              End of Session    Activity Tolerance: Patient tolerated treatment well Patient left: in bed;with call bell/phone within reach;with bed alarm set   Time: 4562-5638 OT Time Calculation (min): 20 min Charges:  OT General Charges $OT Visit: 1 Procedure OT Evaluation $Initial OT Evaluation Tier I: 1 Procedure  Guadelupe Sabin, OTR/L  [phone]  01/20/2015, 2:21 PM

## 2015-01-20 NOTE — Progress Notes (Signed)
SLP Cancellation Note  Patient Details Name: Lucas [name] MRN: [id number] DOB: Apr 04, 1946   Cancelled treatment:       Reason Eval/Treat Not Completed: Other (comment); SLP consult received as part of stroke protocol. Pt passed RN swallow screen yesterday and is reportedly tolerating diet without difficulty swallowing. Unable to complete SLE this date due to scheduling conflicts. Will attempt tomorrow. If pt discharges prior to that time and still presents with dysarthria, recommend outpatient SLP therapy.  Thank you,  Genene Churn, [place]    [place] 01/20/2015, 3:48 PM

## 2015-01-20 NOTE — Progress Notes (Signed)
Patient ID: Lucas [name], male   DOB: 1946/03/27, 69 y.o.   MRN: [id number]    [place] A. Merlene Laughter, MD     [email or web address]          Lucas [name] is an 69 y.o. male.   Assessment/Plan: 1. Acute infarct involving the left midbrain and thalamic regions. 2. Significant ventriculomegaly worrisome for possible normal pressure hydrocephalus. 3. Underlying Parkinson's disease. 4. Alcoholism. 5. Likely prediabetes. 6. Possible sleep disordered breathing.  RECOMMENDATION: Agree with aspirin 325 mg although per hx was not taking aspirin before.  Speech and occupational therapy along with physical therapy. Agree with adding a statin. Reduction in alcohol use. Outpatient sleep study. Continue with current dose of Parkinson's medications. Workup of possible normal pressure hydrocephalus by primary neurologist once the patient has gone through the acute stroke phase.    Report improvement in speech.  GENERAL: Pleasant male in no acute distress.  HEENT: Supple. Atraumatic normocephalic. Left pupil is a 5 mm irregular shaped and nonreactive status post surgery. The right is 4 mm and reactive.  ABDOMEN: soft  EXTREMITIES: No edema   BACK: Normal.  SKIN: Normal by inspection.   MENTAL STATUS: Alert and oriented. The language and cognition are generally intact. Judgment and insight slightly limited. The patient has severe dysarthria- mild better. He does follow commands well. Age and the month are stated appropriately.  CRANIAL NERVES: Extra ocular movements are full, there is no significant nystagmus; visual fields are full; there is complete paralysis of the right lower facial muscle; upper facial muscles and left-sided are normal; tongue protrudes slightly to the right; uvula is midline; shoulder elevation is normal. No visual extinction to double simultaneous stimulation.   MOTOR: Normal tone, bulk and strength; no pronator drift. No drift of the upper  and lower extremities.  COORDINATION: Left finger to nose is normal, right finger to nose is normal. Heel-to-shin is normal bilaterally. There is near continuous low amplitude high frequency rest tremor involving the left hand. There is marked postural tremor left upper extremity mostly a moderate frequency moderate amplitude.   REFLEXES: Deep tendon reflexes are symmetrical and normal. Babinski reflexes are flexor bilaterally.   SENSATION: Normal to light touch. No neglect observed.  Brain CTA is reviewed in person. There is a high-grade stenosis involving the distal left MCA.   Objective: Vital signs in last 24 hours: Temp:  [97.4 F (36.3 C)-98.5 F (36.9 C)] 97.4 F (36.3 C) (05/26 1433) Pulse Rate:  [58-81] 81 (05/26 1504) Resp:  [18] 18 (05/26 1433) BP: (136-198)/(65-85) 136/80 mmHg (05/26 1504) SpO2:  [93 %-99 %] 93 % (05/26 1434)  Intake/Output from previous day: 05/25 0701 - 05/26 0700 In: 871.5 [I.V.:871.5] Out: 150 [Urine:150] Intake/Output this shift: Total I/O In: 597.3 [I.V.:597.3] Out: 700 [Urine:700] Nutritional status: DIET DYS 2 Room service appropriate?: Yes; Fluid consistency:: Thin   Lab Results: Results for orders placed or performed during the hospital encounter of 01/19/15 (from the past 48 hour(s))  Ethanol     Status: None   Collection Time: 01/19/15 10:56 AM  Result Value Ref Range   Alcohol, Ethyl (B) <5 <5 mg/dL    Comment:        LOWEST DETECTABLE LIMIT FOR SERUM ALCOHOL IS 11 mg/dL FOR MEDICAL PURPOSES ONLY   Protime-INR     Status: None   Collection Time: 01/19/15 10:56 AM  Result Value Ref Range   Prothrombin Time 13.7 11.6 - 15.2 seconds   INR 1.03 0.00 -  1.49  APTT     Status: None   Collection Time: 01/19/15 10:56 AM  Result Value Ref Range   aPTT 27 24 - 37 seconds  CBC     Status: None   Collection Time: 01/19/15 10:56 AM  Result Value Ref Range   WBC 9.7 4.0 - 10.5 K/uL   RBC 4.78 4.22 - 5.81 MIL/uL   Hemoglobin 15.1  13.0 - 17.0 g/dL   HCT 45.0 39.0 - 52.0 %   MCV 94.1 78.0 - 100.0 fL   MCH 31.6 26.0 - 34.0 pg   MCHC 33.6 30.0 - 36.0 g/dL   RDW 13.4 11.5 - 15.5 %   Platelets 231 150 - 400 K/uL  Differential     Status: None   Collection Time: 01/19/15 10:56 AM  Result Value Ref Range   Neutrophils Relative % 71 43 - 77 %   Neutro Abs 6.9 1.7 - 7.7 K/uL   Lymphocytes Relative 17 12 - 46 %   Lymphs Abs 1.7 0.7 - 4.0 K/uL   Monocytes Relative 8 3 - 12 %   Monocytes Absolute 0.8 0.1 - 1.0 K/uL   Eosinophils Relative 3 0 - 5 %   Eosinophils Absolute 0.3 0.0 - 0.7 K/uL   Basophils Relative 1 0 - 1 %   Basophils Absolute 0.1 0.0 - 0.1 K/uL  Comprehensive metabolic panel     Status: Abnormal   Collection Time: 01/19/15 10:56 AM  Result Value Ref Range   Sodium 140 135 - 145 mmol/L   Potassium 3.6 3.5 - 5.1 mmol/L   Chloride 109 101 - 111 mmol/L   CO2 24 22 - 32 mmol/L   Glucose, Bld 139 (H) 65 - 99 mg/dL   BUN 17 6 - 20 mg/dL   Creatinine, Ser 0.95 0.61 - 1.24 mg/dL   Calcium 8.6 (L) 8.9 - 10.3 mg/dL   Total Protein 6.7 6.5 - 8.1 g/dL   Albumin 3.8 3.5 - 5.0 g/dL   AST 21 15 - 41 U/L   ALT 5 (L) 17 - 63 U/L   Alkaline Phosphatase 87 38 - 126 U/L   Total Bilirubin 0.8 0.3 - 1.2 mg/dL   GFR calc non Af Amer >60 >60 mL/min   GFR calc Af Amer >60 >60 mL/min    Comment: (NOTE) The eGFR has been calculated using the CKD EPI equation. This calculation has not been validated in all clinical situations. eGFR's persistently <60 mL/min signify possible Chronic Kidney Disease.    Anion gap 7 5 - 15  Troponin I     Status: None   Collection Time: 01/19/15 10:56 AM  Result Value Ref Range   Troponin I <0.03 <0.031 ng/mL    Comment:        NO INDICATION OF MYOCARDIAL INJURY.   TSH     Status: None   Collection Time: 01/19/15 10:56 AM  Result Value Ref Range   TSH 1.564 0.350 - 4.500 uIU/mL  Urine Drug Screen     Status: None   Collection Time: 01/19/15 11:30 AM  Result Value Ref Range    Opiates NONE DETECTED NONE DETECTED   Cocaine NONE DETECTED NONE DETECTED   Benzodiazepines NONE DETECTED NONE DETECTED   Amphetamines NONE DETECTED NONE DETECTED   Tetrahydrocannabinol NONE DETECTED NONE DETECTED   Barbiturates NONE DETECTED NONE DETECTED    Comment:        DRUG SCREEN FOR MEDICAL PURPOSES ONLY.  IF CONFIRMATION IS NEEDED FOR ANY PURPOSE,  NOTIFY LAB WITHIN 5 DAYS.        LOWEST DETECTABLE LIMITS FOR URINE DRUG SCREEN Drug Class       Cutoff (ng/mL) Amphetamine      1000 Barbiturate      200 Benzodiazepine   563 Tricyclics       893 Opiates          300 Cocaine          300 THC              50   Urinalysis, Routine w reflex microscopic     Status: Abnormal   Collection Time: 01/19/15 11:30 AM  Result Value Ref Range   Color, Urine YELLOW YELLOW   APPearance CLEAR CLEAR   Specific Gravity, Urine 1.020 1.005 - 1.030   pH 6.0 5.0 - 8.0   Glucose, UA NEGATIVE NEGATIVE mg/dL   Hgb urine dipstick NEGATIVE NEGATIVE   Bilirubin Urine NEGATIVE NEGATIVE   Ketones, ur TRACE (A) NEGATIVE mg/dL   Protein, ur NEGATIVE NEGATIVE mg/dL   Urobilinogen, UA 0.2 0.0 - 1.0 mg/dL   Nitrite NEGATIVE NEGATIVE   Leukocytes, UA NEGATIVE NEGATIVE    Comment: MICROSCOPIC NOT DONE ON URINES WITH NEGATIVE PROTEIN, BLOOD, LEUKOCYTES, NITRITE, OR GLUCOSE <1000 mg/dL.  Lipid panel     Status: Abnormal   Collection Time: 01/20/15  6:30 AM  Result Value Ref Range   Cholesterol 156 0 - 200 mg/dL   Triglycerides 150 (H) <150 mg/dL   HDL 36 (L) >40 mg/dL   Total CHOL/HDL Ratio 4.3 RATIO   VLDL 30 0 - 40 mg/dL   LDL Cholesterol 90 0 - 99 mg/dL    Comment:        Total Cholesterol/HDL:CHD Risk Coronary Heart Disease Risk Table                     Men   Women  1/2 Average Risk   3.4   3.3  Average Risk       5.0   4.4  2 X Average Risk   9.6   7.1  3 X Average Risk  23.4   11.0        Use the calculated Patient Ratio above and the CHD Risk Table to determine the patient's CHD  Risk.        ATP III CLASSIFICATION (LDL):  <100     mg/dL   Optimal  100-129  mg/dL   Near or Above                    Optimal  130-159  mg/dL   Borderline  160-189  mg/dL   High  >190     mg/dL   Very High   Basic metabolic panel     Status: Abnormal   Collection Time: 01/20/15  6:30 AM  Result Value Ref Range   Sodium 141 135 - 145 mmol/L   Potassium 3.7 3.5 - 5.1 mmol/L   Chloride 107 101 - 111 mmol/L   CO2 26 22 - 32 mmol/L   Glucose, Bld 101 (H) 65 - 99 mg/dL   BUN 13 6 - 20 mg/dL   Creatinine, Ser 0.86 0.61 - 1.24 mg/dL   Calcium 8.3 (L) 8.9 - 10.3 mg/dL   GFR calc non Af Amer >60 >60 mL/min   GFR calc Af Amer >60 >60 mL/min    Comment: (NOTE) The eGFR has been calculated using the CKD EPI equation. This  calculation has not been validated in all clinical situations. eGFR's persistently <60 mL/min signify possible Chronic Kidney Disease.    Anion gap 8 5 - 15  CBC     Status: None   Collection Time: 01/20/15  6:30 AM  Result Value Ref Range   WBC 7.5 4.0 - 10.5 K/uL   RBC 4.34 4.22 - 5.81 MIL/uL   Hemoglobin 13.7 13.0 - 17.0 g/dL   HCT 41.2 39.0 - 52.0 %   MCV 94.9 78.0 - 100.0 fL   MCH 31.6 26.0 - 34.0 pg   MCHC 33.3 30.0 - 36.0 g/dL   RDW 13.4 11.5 - 15.5 %   Platelets 209 150 - 400 K/uL  Vitamin B12     Status: None   Collection Time: 01/20/15  6:30 AM  Result Value Ref Range   Vitamin B-12 545 180 - 914 pg/mL    Comment: (NOTE) This assay is not validated for testing neonatal or myeloproliferative syndrome specimens for Vitamin B12 levels. Performed at [facility]     Lipid Panel  Recent Labs  01/20/15 0630  CHOL 156  TRIG 150*  HDL 36*  CHOLHDL 4.3  VLDL 30  LDLCALC 90    Studies/Results:   Medications:  Scheduled Meds: . albuterol  2.5 mg Nebulization TID  . aspirin EC  325 mg Oral Daily  . atorvastatin  10 mg Oral q1800  . carbidopa-levodopa  1 tablet Oral TID  . gabapentin  300 mg Oral TID  . heparin  5,000 Units  Subcutaneous 3 times per day  . mometasone-formoterol  2 puff Inhalation BID  . multivitamin with minerals  1 tablet Oral Daily  . nicotine  21 mg Transdermal Daily  . PARoxetine  40 mg Oral BH-q7a  . rOPINIRole  1 mg Oral BID  . thiamine  250 mg Oral Daily   Continuous Infusions:  PRN Meds:.acetaminophen, albuterol, clonazePAM, senna-docusate    BRAIN CTA: Anterior circulation: Atherosclerotic irregularity is more prominent in the right than left cavernous internal carotid artery. There is no significant stenosis. The A1 and M1 segments are normal. The anterior communicating artery is patent. The high cervical internal carotid arteries are normal bilaterally.  A moderate stenosis is present in the proximal left M1 segment. A more high-grade stenosis is present in the distal left M1 segment with marked attenuation of residual left PCA MCA branch vessels. Prominent collateral vessels are noted. The right MCA bifurcation is intact. There is moderate attenuation see branch vessels diffusely without a proximal stenosis. The  Posterior circulation: The right vertebral artery is the dominant vessel. PICA origins are not visualized. Dominant AICA vessels are seen bilaterally. There is mild attenuation of basilar artery diffusely. Posterior cerebral arteries originate from P1 segments and posterior communicating arteries bilaterally. There is moderate attenuation of distal PCA branch vessels.  Venous sinuses: The dural sinuses are patent.  Anatomic variants: Prominent posterior communicating arteries bilaterally.  Delayed phase:Delayed images demonstrate no pathologic enhancement.  Carotid duplex Doppler unremarkable.   LOS: 1 day   Athelene Hursey A. Merlene Laughter, M.D.  Diplomate, Tax adviser of Psychiatry and Neurology ( Neurology).

## 2015-01-20 NOTE — Evaluation (Signed)
Physical Therapy Evaluation Patient Details Name: Lucas [name] MRN: [id number] DOB: [birth year] Today's Date: 01/20/2015   History of Present Illness  Pt is a 69 year old male with Parkinson's disease who was admitted with an acute left thalamic infarct.  His initial symptoms involved slurred speech, difficulty walking.  He lives alone with family next door.  Clinical Impression   Pt was seen for evaluation and is found to be at functional baseline.  His strength is 4/5, he is independent with transfers and gait is stable with a walker for at least 200'.  His Parkinson's symptoms appear to be well controlled with only mild balance dysfunction which is accommodated with the use of a walker.    Follow Up Recommendations No PT follow up    Equipment Recommendations  None recommended by PT    Recommendations for Other Services   none    Precautions / Restrictions Precautions Precautions: Fall Restrictions Weight Bearing Restrictions: No      Mobility  Bed Mobility Overal bed mobility: Independent                Transfers Overall transfer level: Independent                  Ambulation/Gait Ambulation/Gait assistance: Modified independent (Device/Increase time) Ambulation Distance (Feet): 200 Feet Assistive device: Rolling walker (2 wheeled) Gait Pattern/deviations: WFL(Within Functional Limits)   Gait velocity interpretation: at or above normal speed for age/gender    Stairs            Wheelchair Mobility    Modified Rankin (Stroke Patients Only) Modified Rankin (Stroke Patients Only) Pre-Morbid Rankin Score: Slight disability Modified Rankin: Slight disability     Balance Overall balance assessment: No apparent balance deficits (not formally assessed) (with use of a walker)                                           Pertinent Vitals/Pain Pain Assessment: No/denies pain    Home Living Family/patient expects to be  discharged to:: Private residence Living Arrangements: Alone Available Help at Discharge: Family;Available PRN/intermittently Type of Home: House       Home Layout: One level Home Equipment: [place] - 2 wheels;Cane - single point      Prior Function Level of Independence: Independent with assistive device(s)               Hand Dominance   Dominant Hand: Right    Extremity/Trunk Assessment   Upper Extremity Assessment: Defer to OT evaluation           Lower Extremity Assessment: Overall WFL for tasks assessed      Cervical / Trunk Assessment: Normal  Communication   Communication: No difficulties  Cognition Arousal/Alertness: Awake/alert Behavior During Therapy: WFL for tasks assessed/performed Overall Cognitive Status: Within Functional Limits for tasks assessed                      General Comments      Exercises        Assessment/Plan    PT Assessment Patent does not need any further PT services  PT Diagnosis     PT Problem List    PT Treatment Interventions     PT Goals (Current goals can be found in the Care Plan section) Acute Rehab PT Goals PT Goal Formulation: All assessment and education  complete, DC therapy    Frequency     Barriers to discharge  none      Co-evaluation               End of Session Equipment Utilized During Treatment: Gait belt Activity Tolerance: Patient tolerated treatment well Patient left: in chair;with call bell/phone within reach;with chair alarm set Nurse Communication: Mobility status         Time: 1594-7076 PT Time Calculation (min) (ACUTE ONLY): 24 min   Charges:   PT Evaluation $Initial PT Evaluation Tier I: 1 Procedure     PT G CodesDemetrios Isaacs L 01/20/2015, 10:15 AM

## 2015-01-21 LAB — HIV ANTIBODY (ROUTINE TESTING W REFLEX): HIV SCREEN 4TH GENERATION: NONREACTIVE

## 2015-01-21 LAB — HOMOCYSTEINE: Homocysteine: 10.7 umol/L (ref 0.0–15.0)

## 2015-01-21 LAB — HEMOGLOBIN A1C
HEMOGLOBIN A1C: 5.6 % (ref 4.8–5.6)
MEAN PLASMA GLUCOSE: 114 mg/dL

## 2015-01-21 LAB — RPR: RPR Ser Ql: NONREACTIVE

## 2015-01-21 MED ORDER — MOMETASONE FURO-FORMOTEROL FUM 100-5 MCG/ACT IN AERO: 2.0000 | INHALATION_SPRAY | Freq: Two times a day (BID) | RESPIRATORY_TRACT | Status: DC

## 2015-01-21 MED ORDER — NICOTINE 21 MG/24HR TD PT24: MEDICATED_PATCH | TRANSDERMAL | Status: DC

## 2015-01-21 MED ORDER — ATORVASTATIN CALCIUM 10 MG PO TABS: 10.0000 mg | ORAL_TABLET | Freq: Every day | ORAL | Status: DC

## 2015-01-21 MED ORDER — ALBUTEROL SULFATE HFA 108 (90 BASE) MCG/ACT IN AERS: 2.0000 | INHALATION_SPRAY | RESPIRATORY_TRACT | Status: DC | PRN

## 2015-01-21 MED ORDER — AMLODIPINE BESYLATE 5 MG PO TABS
2.5000 mg | ORAL_TABLET | Freq: Every day | ORAL | Status: DC
  Administered 2015-01-21: 2.5 mg via ORAL
  Filled 2015-01-21: qty 1

## 2015-01-21 MED ORDER — AMLODIPINE BESYLATE 2.5 MG PO TABS: 2.5000 mg | ORAL_TABLET | Freq: Every day | ORAL | Status: DC

## 2015-01-21 NOTE — Discharge Summary (Signed)
Physician Discharge Summary  Lucas [name] VCB:[id number] DOB: [birth year] DOA: 01/19/2015  PCP: Pat Kocher (pending).  Admit date: 01/19/2015 Discharge date: 01/21/2015  Time spent: Greater than 30 minutes  Recommendations for Outpatient Follow-up:  1. Recommend follow-up of the patient's ventriculomegaly by neurology to assess for possible normal pressure hydrocephalus 2. Consider outpatient sleep study to rule out obstructive sleep apnea.   Discharge Diagnoses:   1. Acute ischemic stroke involving the inferior left thalamus and the left cerebral peduncle. 2. Moderately severe cerebrovascular disease per CT angiogram of the head/MRA of the head. 3. Cerebral ventriculomegaly. 4. Dysarthria and right facial droop secondary to stroke. 5. Parkinson's disease. 6. Probable COPD. 7. Tobacco abuse. 8. Alcohol use with no evidence of alcoholism. 9. Depression with anxiety. 10. Mild hyperglycemia. Resolved. Hemoglobin A1c was 5.6. 11. Mild obesity.  Discharge Condition: Improved.  Diet recommendation: Heart healthy.  Filed Weights   01/19/15 1038 01/19/15 1600  Weight: 79.833 kg (176 lb) 83.3 kg (183 lb 10.3 oz)    History of present illness:   Lucas [name] is a 69 y.o. male with a history of Parkinson's disease, tobacco abuse, and daily alcohol use-without alcoholism per family. He was recently hospitalized from 01/15/15 through 01/16/15 for evaluation of dysarthria. CT of his head at that time revealed small remote lacunar infarcts involving the lateral basal ganglia, but no evidence of an acute infarction. The plan was for an outpatient MRI. Apparently, the patient showed up for the MRI on 01/18/15, but left AMA before it was done. He returned to the ED at the admonition of his daughter, Alyse Low. The patient continued to have slurred speech, difficulty walking, and generalized weakness. Alyse Low reported that he had a couple falls at home, but the patient denied this. He has chronic  tremor of both hands from Parkinson's disease. Patient denied visual changes, difficulty swallowing his food, dizziness, numbness of his face or extremities, chest pain, palpitations, nausea, vomiting, lower extremity swelling, blood in his stools, and pain with urination. He does have chronic wheezes and shortness of breath. He did have some incontinent of his bladder prior to admission, primarily because he could not get to the bathroom in time. In the ED, he was afebrile and hemodynamically stable. His lab data were significant for glucose 139. His chest x-ray revealed no active cardiopulmonary disease. MRI of his brain revealed an acute 10 mm nonhemorrhagic infarct involving the inferior left thalamus extending into the left cerebral pedicle. He was admitted for further evaluation and management.  Hospital Course:  Acute ischemic stroke involving the inferior left thalamus and left cerebral peduncle.  The patient's symptoms started prior to 01/15/15. He did present to the hospital and was hospitalized from 5/21-5/22/2016 for workup of dysarthria. CT of his head at that time revealed small remote lacunar infarcts, but no acute stroke. He was discharged on aspirin. Outpatient MRI was scheduled, but the patient left AMA on 01/18/15 before was done. In the ED, during this admission, MRI of the brain was ordered and it confirmed an acute left thalamic stroke. He was restarted on aspirin therapy. Dr. Merlene Laughter, neurologist, was consulted. He noted that the patient had ventriculomegaly on the MRI. He recommended possible workup for normal pressure hydrocephalus by his primary neurologist once the patient has gone through the acute stroke phase. CT angiogram of the patient's head was also ordered and it revealed high-grade proximal left M1 stenosis and diffuse cerebrovascular disease. -Patient's carotid ultrasound revealed no significant ICA stenosis bilaterally.  -His  echo revealed an EF of 65-70% and no  significant valvular abnormalities. -Vitamin B12 level and TSH were within normal limits. HIV serology and RPR were both negative. - Fasting lipid profile revealed total cholesterol 156, triglycerides 150, HDL 36, and LDL 90. Lipitor was started empirically. The physical and occupational therapists evaluated him and believed he did not need any further therapy. The patient was discharged in improved condition, but he was instructed not to drive until he was cleared by his primary neurologist in [place].  Dysarthria and right facial droop secondary to #1. Parkinson's disease. Stable. He was continued on Sinemet, gabapentin, and Requip. Tobacco abuse with probable COPD. The patient was treated with Solu-Medrol and nebulizers during the previous hospitalization. For mild wheezes, he was started on albuterol nebulizer every 8 hours. Dulera was added. He was advised to stop smoking and tobacco cessation counseling was ordered. Alcohol use. The patient drinks 2 beers totaling 24 ounces daily. His daughter did not endorse alcoholism. Thiamine was ordered daily. Hyperglycemia. This was mild and could've been secondary to residual steroid therapy from the previous hospitalization. His venous glucose normalized. Hemoglobin A1c was 5.6. Depression with anxiety. Remains stable. He was continued on Klonopin and Paxil.      Procedures: 2-D echocardiogram: Study Conclusions - Left ventricle: The cavity size was normal. Wall thickness was increased in a pattern of mild LVH. Systolic function was vigorous. The estimated ejection fraction was in the range of 65% to 70%. Wall motion was normal; there were no regional wall motion abnormalities. Left ventricular diastolic function parameters were normal for the patient&'s age. - Aortic valve: Trileaflet; mildly calcified leaflets. There was trivial regurgitation. - Left atrium: The atrium was at the upper limits of normal in size. - Tricuspid  valve: There was physiologic regurgitation. - Pulmonary arteries: Systolic pressure could not be accurately estimated. - Inferior vena cava: Not visualized. - Pericardium, extracardiac: There was no pericardial effusion. Impressions: - Mild LVH with LVEF 65-70%, grossly normal diastolic function. Upper normal left atrial size. Trivial aortic regurgitation.  Consultations:  Neurology  Discharge Exam: Filed Vitals:   01/21/15 0910  BP: 137/74  Pulse: 56  Temp: 97.5 F (36.4 C)  Resp: 19    General: 69 year old Caucasian man in no acute distress.  Cardiovascular: S1, S2, with a 2/6 systolic murmur with radiation to the right greater than left carotid arteries.  Respiratory: Clear to auscultation bilaterally  Abdomen: Obese, positive bowel sounds, soft, nontender, nondistended.  Musculoskeletal/extremities: No acute hot red joints. Pedal pulses palpable. No pedal edema.  Neurologic: He is alert and oriented 3. He has right sided facial droop. He has dysarthria-less than admission exam. He has tongue deviation to the right. Strength of the upper extremities is 5 over 5 bilaterally. Strength of the right lower extremity is 5 over 5 in strength of the left lower extremity is 5 minus over 5. He has a pin rolling tremor of his hands, left greater than right.  Discharge Instructions   Discharge Instructions    Diet - low sodium heart healthy    Complete by:  As directed      Discharge instructions    Complete by:  As directed   1.) Stop smoking. Nicotine patch to be bought without a prescription. 2.) Follow-up with your neurologist-he can request records from the medical record department at [facility]. 3.) Take medications as prescribed. 4.) Do not drive until you're cleared with your neurologist.     Driving Restrictions  Complete by:  As directed   Do not drive into your cleared with your neurologist.     Increase activity slowly    Complete by:  As  directed           Current Discharge Medication List    START taking these medications   Details  amLODipine (NORVASC) 2.5 MG tablet Take 1 tablet (2.5 mg total) by mouth daily. Medication to treat her high blood pressure. Qty: 30 tablet, Refills: 3    atorvastatin (LIPITOR) 10 MG tablet Take 1 tablet (10 mg total) by mouth daily at 6 PM. Medication for your slightly elevated cholesterol level and for stroke. Qty: 30 tablet, Refills: 3    mometasone-formoterol (DULERA) 100-5 MCG/ACT AERO Inhale 2 puffs into the lungs 2 (two) times daily. For treatment of her chronic bronchitis. Qty: 1 Inhaler, Refills: 3    nicotine (NICODERM CQ - DOSED IN MG/24 HOURS) 21 mg/24hr patch Use as directed on the label. Can buy this patch without a prescription. Qty: 28 patch, Refills: 0      CONTINUE these medications which have CHANGED   Details  albuterol (PROVENTIL HFA;VENTOLIN HFA) 108 (90 BASE) MCG/ACT inhaler Inhale 2 puffs into the lungs every 4 (four) hours as needed for wheezing or shortness of breath. Qty: 1 Inhaler, Refills: 3      CONTINUE these medications which have NOT CHANGED   Details  aspirin EC 81 MG EC tablet Take 1 tablet (81 mg total) by mouth daily.    carbidopa-levodopa (SINEMET IR) 25-250 MG per tablet Take 1 tablet by mouth 3 (three) times daily.    clonazePAM (KLONOPIN) 0.5 MG tablet Take 0.5 mg by mouth 2 (two) times daily as needed for anxiety.    gabapentin (NEURONTIN) 300 MG capsule Take 300 mg by mouth 3 (three) times daily.    Multiple Vitamin (MULTIVITAMIN WITH MINERALS) TABS tablet Take 1 tablet by mouth daily.    PARoxetine (PAXIL) 40 MG tablet Take 40 mg by mouth every morning.    rOPINIRole (REQUIP) 1 MG tablet Take 1 mg by mouth 2 (two) times daily.       No Known Allergies Follow-up Information    Follow up with [name], MD.   Specialty:  Family Medicine   Why:  Follow-up as scheduled.   Contact information:   [address] [place]  [address] [phone]       Please follow up.   Why:  Follow-up with your neurologist in [place] in 1-2 weeks.       The results of significant diagnostics from this hospitalization (including imaging, microbiology, ancillary and laboratory) are listed below for reference.    Significant Diagnostic Studies: Ct Angio Head W/cm &/or Wo Cm  01/19/2015   CLINICAL DATA:  Acute infarct of the left thalamus.  EXAM: CT ANGIOGRAPHY HEAD  TECHNIQUE: Multidetector CT imaging of the head was performed using the standard protocol during bolus administration of intravenous contrast. Multiplanar CT image reconstructions and MIPs were obtained to evaluate the vascular anatomy.  CONTRAST:  22mL OMNIPAQUE IOHEXOL 350 MG/ML SOLN  COMPARISON:  MRI brain from the same day.  FINDINGS: CT HEAD  Brain: The acute nonhemorrhagic infarct in the left thalamus is again noted. A remote lacunar infarct in the basal ganglia is again seen. Ventricular dilation is stable  Calvarium and skull base: The calvarium is intact.  Paranasal sinuses: Fluid levels are present in the maxillary sinuses bilaterally. Mild mucosal thickening is present in the sphenoid sinuses.  There is fluid in left mastoid air cells. No obstructing nasopharyngeal lesion is evident.  Orbits: Bilateral lens replacements are present. The globes and orbits are otherwise intact.  CTA HEAD  Anterior circulation: Atherosclerotic irregularity is more prominent in the right than left cavernous internal carotid artery. There is no significant stenosis. The A1 and M1 segments are normal. The anterior communicating artery is patent. The high cervical internal carotid arteries are normal bilaterally.  A moderate stenosis is present in the proximal left M1 segment. A more high-grade stenosis is present in the distal left M1 segment with marked attenuation of residual left PCA MCA branch vessels. Prominent collateral vessels are noted. The right MCA bifurcation is intact. There is  moderate attenuation see branch vessels diffusely without a proximal stenosis. The  Posterior circulation: The right vertebral artery is the dominant vessel. PICA origins are not visualized. Dominant AICA vessels are seen bilaterally. There is mild attenuation of basilar artery diffusely. Posterior cerebral arteries originate from P1 segments and posterior communicating arteries bilaterally. There is moderate attenuation of distal PCA branch vessels.  Venous sinuses: The dural sinuses are patent.  Anatomic variants: Prominent posterior communicating arteries bilaterally.  Delayed phase:Delayed images demonstrate no pathologic enhancement.  IMPRESSION: 1. Acute/subacute lab infarct is stable. 2. Stable ventricular enlargement. 3. Moderate proximal left M1 and high-grade distal left disc stenosis. 4. Marked attenuation of left MCA branch vessels with prominent collateral vessels noted. 5. Diffuse small vessel disease noted.   Electronically Signed   By: San Morelle M.D.   On: 01/19/2015 20:55   Dg Chest 2 View  01/19/2015   CLINICAL DATA:  Dizziness, Parkinson's disease  EXAM: CHEST  2 VIEW  COMPARISON:  01/15/2015  FINDINGS: Cardiomediastinal silhouette is stable. No acute infiltrate or pleural effusion. Probable chronic mild interstitial prominence is stable. Mild degenerative changes lower thoracic spine.  IMPRESSION: No active cardiopulmonary disease.  No significant change.   Electronically Signed   By: Lahoma Crocker M.D.   On: 01/19/2015 12:14   Dg Chest 2 View  01/15/2015   CLINICAL DATA:  Increasing weakness and shortness of breath.  EXAM: CHEST  2 VIEW  COMPARISON:  06/26/2010  FINDINGS: The cardiomediastinal contours are normal. There are diffuse fine reticular opacities throughout both lungs. No consolidation, pleural effusion, or pneumothorax. No acute osseous abnormalities are seen. Degenerative change throughout the spine.  IMPRESSION: Fine diffuse reticular opacities throughout both lungs.  This may be chronic, or seen in atypical infection.   Electronically Signed   By: Jeb Levering M.D.   On: 01/15/2015 23:28   Ct Head Wo Contrast  01/15/2015   CLINICAL DATA:  Initial evaluation for increase weakness, slurred speech.  EXAM: CT HEAD WITHOUT CONTRAST  TECHNIQUE: Contiguous axial images were obtained from the base of the skull through the vertex without intravenous contrast.  COMPARISON:  Prior study from 10/06/2009.  FINDINGS: Code cerebral volume within normal limits for patient age. Ventricular prominence out of proportion to the cortical sulcation is stable from previous examination. A few small remote lacunar infarct present within the bilateral basal ganglia.  No acute large vessel territory infarct. No intracranial hemorrhage. No mass lesion or mass effect. No extra-axial fluid collection.  Scalp soft tissues within normal limits. No acute abnormality about the orbits.  Mucosal thickening with layering opacity present within the partially visualized maxillary sinuses. Small amount of opacity present within the sphenoid sinuses as well. Scattered mucosal thickening present within the ethmoidal air cells. Scattered opacity present within  the left mastoid air cells. Right mastoid air cells clear.  Calvarium intact.  IMPRESSION: 1. No acute intracranial process. 2. Small remote lacunar infarcts involving the bilateral basal ganglia. 3. Prominence of the ventricular system relative to cortical sulcation, which can be seen with NPH. This is stable relative to previous CT from 2011.   Electronically Signed   By: Jeannine Boga M.D.   On: 01/15/2015 22:33   Mr Jodene Nam Head Wo Contrast  01/19/2015   CLINICAL DATA:  Acute infarct left thalamus. Weakness. Abnormal speech.  EXAM: MRA HEAD WITHOUT CONTRAST  TECHNIQUE: Angiographic images of the Circle of Willis were obtained using MRA technique without intravenous contrast.  COMPARISON:  MRI brain from the same day.  FINDINGS: Mild atherosclerotic  irregularities are present within the cavernous internal carotid arteries without a significant stenosis from the high cervical segments through the ICA termini bilaterally. The proximal A1 segments are normal bilaterally. Signal loss is present in the distal right A1. The right M1 segment is visualized to the bifurcation. There is significant signal loss proximally in the left M1 segment. ACA and MCA branch vessels are not visualized. There is significant motion in these regions.  The right vertebral artery is the dominant vessel. Neither PICA origin is visualized. There is narrowing of the proximal left vertebral artery. The vertebrobasilar junction is intact. The basilar artery demonstrates mild narrowing distally. Both posterior cerebral arteries originate from the basilar tip with prominent posterior communicating arteries bilaterally. PCA branch vessels are obscured.  IMPRESSION: 1. Atherosclerotic changes proximally without a significant proximal stenosis, aneurysm, or branch vessel occlusion proximal to the ICA terminus or mid basilar artery. 2. The more distal vessels are significantly attenuated. Extensive motion artifact likely contributes. The patient is nondiagnostic for branch vessel disease. The could be repeated or CTA be performed if further evaluation is desired. This would optimally be performed when the patient is able to hold still.   Electronically Signed   By: San Morelle M.D.   On: 01/19/2015 15:28   Mr Brain Wo Contrast  01/19/2015   CLINICAL DATA:  Dizziness. Abnormal CT head. Abnormal speech and gait. Generalized weakness.  EXAM: MRI HEAD WITHOUT CONTRAST  TECHNIQUE: Multiplanar, multiecho pulse sequences of the brain and surrounding structures were obtained without intravenous contrast.  COMPARISON:  CT of the head 01/15/2015  FINDINGS: Diffusion-weighted images demonstrate an acute nonhemorrhagic infarct measuring 10 x 10 x 6 mm involving the inferior left thalamus and  extending along the cortical spinal tracts into the left cerebral peduncle.  T2 changes are associated with. The acute infarct. Moderate prominence of the ventricles is stable. The fourth ventricle is mildly enlarged is well.  A remote punctate lacunar infarct is seen along the inferior aspect of the right cerebellum. There is remote lacunar infarcts within the right thalamus.  Flow is present in the major intracranial arteries. Mild diffuse periventricular white matter changes are present bilaterally. Bilateral lens replacements are noted.  Fluid levels are present in the maxillary sinuses bilaterally. The remaining paranasal sinuses are clear. There is fluid in left mastoid air cells. No obstructing nasopharyngeal lesion is evident. Midline structures normal.  IMPRESSION: 1. Acute 10 mm nonhemorrhagic infarct involving the inferior left thalamus extending into the left cerebral peduncle. 2. Stable diffuse ventricular dilation without evidence for acute hydrocephalus. 3. Mild white matter disease. This likely reflects the sequela of chronic microvascular ischemia. 4. Bilateral maxillary sinus disease. 5. Left mastoid effusion. No obstructing nasopharyngeal lesion is evident. These results were  called by telephone at the time of interpretation on 01/19/2015 at 3:25 pm to Dr. Nat Christen, who verbally acknowledged these results.   Electronically Signed   By: San Morelle M.D.   On: 01/19/2015 15:25   US Carotid Bilateral  01/20/2015   CLINICAL DATA:  Acute ischemic CVA  EXAM: BILATERAL CAROTID DUPLEX ULTRASOUND  TECHNIQUE: Pearline Cables scale imaging, color Doppler and duplex ultrasound were performed of bilateral carotid and vertebral arteries in the neck.  COMPARISON:  None.  FINDINGS: Criteria: Quantification of carotid stenosis is based on velocity parameters that correlate the residual internal carotid diameter with NASCET-based stenosis levels, using the diameter of the distal internal carotid lumen as the  denominator for stenosis measurement.  The following peak systolic velocity measurements were obtained:  RIGHT  ICA:  64 cm/sec  CCA:  97 cm/sec  SYSTOLIC ICA/CCA RATIO:  0.7  DIASTOLIC ICA/CCA RATIO:  1.0  ECA:  107 cm/sec  LEFT  ICA:  54 cm/sec  CCA:  973 cm/sec  SYSTOLIC ICA/CCA RATIO:  0.4  DIASTOLIC ICA/CCA RATIO:  0.9  ECA:  133 cm/sec  RIGHT CAROTID ARTERY: There is diffuse intimal thickening on the right. There is decreased echogenicity plaque at the end origin of the right internal carotid artery causing approximately 30-35% diameter stenosis. No stenosis of 50% diameter greater is seen by real-time interrogation on the right.  RIGHT VERTEBRAL ARTERY:  Flow is antegrade.  LEFT CAROTID ARTERY: There is mild generalized intimal thickening. There is slight plaque at the bifurcation causing approximately 15-20% diameter stenosis. No stenosis approaching 50% diameter is seen on the left.  LEFT VERTEBRAL ARTERY:  Flow is antegrade.  IMPRESSION: Relatively mild plaque formation bilaterally without stenosis approaching 50% diameter in either carotid distribution. Carotid waveforms do not show evidence of hemodynamically significant obstruction on either side. Flow in both vertebral arteries is in the anatomic direction.   Electronically Signed   By: Lowella Grip III M.D.   On: 01/20/2015 09:40    Microbiology: No results found for this or any previous visit (from the past 240 hour(s)).   Labs: Basic Metabolic Panel:  Recent Labs Lab 01/15/15 2116 01/16/15 0559 01/19/15 1056 01/20/15 0630  NA 141 140 140 141  K 3.8 3.5 3.6 3.7  CL 108 106 109 107  CO2 28 24 24 26   GLUCOSE 116* 166* 139* 101*  BUN 15 16 17 13   CREATININE 1.03 0.99 0.95 0.86  CALCIUM 8.2* 8.7* 8.6* 8.3*   Liver Function Tests:  Recent Labs Lab 01/15/15 2116 01/19/15 1056  AST 18 21  ALT 12* 5*  ALKPHOS 84 87  BILITOT 0.6 0.8  PROT 6.5 6.7  ALBUMIN 3.6 3.8   No results for input(s): LIPASE, AMYLASE in the last  168 hours. No results for input(s): AMMONIA in the last 168 hours. CBC:  Recent Labs Lab 01/15/15 2116 01/16/15 0559 01/19/15 1056 01/20/15 0630  WBC 8.8 8.5 9.7 7.5  NEUTROABS 5.1  --  6.9  --   HGB 14.5 15.4 15.1 13.7  HCT 42.3 45.5 45.0 41.2  MCV 93.4 93.6 94.1 94.9  PLT 209 241 231 209   Cardiac Enzymes:  Recent Labs Lab 01/15/15 2116 01/19/15 1056  TROPONINI <0.03 <0.03   BNP: BNP (last 3 results)  Recent Labs  01/15/15 2116  BNP 35.0    ProBNP (last 3 results) No results for input(s): PROBNP in the last 8760 hours.  CBG: No results for input(s): GLUCAP in the last 168 hours.  Signed:  Krupa Stege  [facility] 01/21/2015, 12:31 PM

## 2015-01-21 NOTE — Care Management Note (Signed)
Case Management Note  Patient Details  Name: Lucas [name] MRN: [id number] Date of Birth: [birth year]  Subjective/Objective:                    Action/Plan:   Expected Discharge Date:  01/21/15               Expected Discharge Plan:  Home/Self Care  In-House Referral:  NA  Discharge planning Services  CM Consult  Post Acute Care Choice:  NA Choice offered to:  NA  DME Arranged:    DME Agency:     HH Arranged:    [place] Agency:     Status of Service:  Completed, signed off  Medicare Important Message Given:  N/A - LOS <3 / Initial given by admissions Date Medicare IM Given:    Medicare IM give by:    Date Additional Medicare IM Given:    Additional Medicare Important Message give by:     If discussed at [place] of Stay Meetings, dates discussed:    Additional Comments: Pt discharged home today. No CM needs noted. Christinia Gully [place], RN 01/21/2015, 1:07 PM

## 2015-01-21 NOTE — Progress Notes (Signed)
Lucas [name] discharged home with wife per MD order.  Discharge instructions reviewed and discussed with the patient, all questions and concerns answered. Copy of instructions and scripts given to patient.    Medication List    TAKE these medications        albuterol 108 (90 BASE) MCG/ACT inhaler  Commonly known as:  PROVENTIL HFA;VENTOLIN HFA  Inhale 2 puffs into the lungs every 4 (four) hours as needed for wheezing or shortness of breath.     amLODipine 2.5 MG tablet  Commonly known as:  NORVASC  Take 1 tablet (2.5 mg total) by mouth daily. Medication to treat her high blood pressure.     aspirin 81 MG EC tablet  Take 1 tablet (81 mg total) by mouth daily.     atorvastatin 10 MG tablet  Commonly known as:  LIPITOR  Take 1 tablet (10 mg total) by mouth daily at 6 PM. Medication for your slightly elevated cholesterol level and for stroke.     carbidopa-levodopa 25-250 MG per tablet  Commonly known as:  SINEMET IR  Take 1 tablet by mouth 3 (three) times daily.     clonazePAM 0.5 MG tablet  Commonly known as:  KLONOPIN  Take 0.5 mg by mouth 2 (two) times daily as needed for anxiety.     gabapentin 300 MG capsule  Commonly known as:  NEURONTIN  Take 300 mg by mouth 3 (three) times daily.     mometasone-formoterol 100-5 MCG/ACT Aero  Commonly known as:  DULERA  Inhale 2 puffs into the lungs 2 (two) times daily. For treatment of her chronic bronchitis.     multivitamin with minerals Tabs tablet  Take 1 tablet by mouth daily.     nicotine 21 mg/24hr patch  Commonly known as:  NICODERM CQ - dosed in mg/24 hours  Use as directed on the label. Can buy this patch without a prescription.     PARoxetine 40 MG tablet  Commonly known as:  PAXIL  Take 40 mg by mouth every morning.     rOPINIRole 1 MG tablet  Commonly known as:  REQUIP  Take 1 mg by mouth 2 (two) times daily.        Patients skin is clean, dry and intact, no evidence of skin break down. IV site discontinued  and catheter remains intact. Site without signs and symptoms of complications. Dressing and pressure applied.  Patient escorted to car by Lovena Le, RN in a wheelchair,  no distress noted upon discharge.  Regino Bellow 01/21/2015 1:20 PM

## 2015-06-05 ENCOUNTER — Emergency Department (HOSPITAL_COMMUNITY): Payer: Medicare Other

## 2015-06-05 ENCOUNTER — Encounter (HOSPITAL_COMMUNITY): Payer: Self-pay | Admitting: Emergency Medicine

## 2015-06-05 ENCOUNTER — Inpatient Hospital Stay (HOSPITAL_COMMUNITY)
Admission: EM | Admit: 2015-06-05 | Discharge: 2015-06-07 | DRG: 065 | Disposition: A | Discharge status: Home Health | Payer: Medicare Other | Attending: Internal Medicine | Admitting: Internal Medicine

## 2015-06-05 DIAGNOSIS — R531 Weakness: Secondary | ICD-10-CM

## 2015-06-05 DIAGNOSIS — W19XXXD Unspecified fall, subsequent encounter: Secondary | ICD-10-CM | POA: Diagnosis not present

## 2015-06-05 DIAGNOSIS — I639 Cerebral infarction, unspecified: Principal | ICD-10-CM | POA: Diagnosis present

## 2015-06-05 DIAGNOSIS — I69322 Dysarthria following cerebral infarction: Secondary | ICD-10-CM | POA: Diagnosis not present

## 2015-06-05 DIAGNOSIS — E785 Hyperlipidemia, unspecified: Secondary | ICD-10-CM | POA: Diagnosis present

## 2015-06-05 DIAGNOSIS — F17210 Nicotine dependence, cigarettes, uncomplicated: Secondary | ICD-10-CM | POA: Diagnosis present

## 2015-06-05 DIAGNOSIS — G20 Parkinson's disease: Secondary | ICD-10-CM | POA: Diagnosis present

## 2015-06-05 DIAGNOSIS — R296 Repeated falls: Secondary | ICD-10-CM | POA: Diagnosis present

## 2015-06-05 DIAGNOSIS — Z9119 Patient's noncompliance with other medical treatment and regimen: Secondary | ICD-10-CM | POA: Diagnosis not present

## 2015-06-05 DIAGNOSIS — G20A1 Parkinson's disease without dyskinesia, without mention of fluctuations: Secondary | ICD-10-CM | POA: Diagnosis present

## 2015-06-05 DIAGNOSIS — J449 Chronic obstructive pulmonary disease, unspecified: Secondary | ICD-10-CM | POA: Diagnosis present

## 2015-06-05 DIAGNOSIS — F418 Other specified anxiety disorders: Secondary | ICD-10-CM | POA: Diagnosis present

## 2015-06-05 DIAGNOSIS — W19XXXA Unspecified fall, initial encounter: Secondary | ICD-10-CM | POA: Diagnosis present

## 2015-06-05 DIAGNOSIS — I10 Essential (primary) hypertension: Secondary | ICD-10-CM | POA: Diagnosis present

## 2015-06-05 DIAGNOSIS — G8194 Hemiplegia, unspecified affecting left nondominant side: Secondary | ICD-10-CM | POA: Diagnosis present

## 2015-06-05 LAB — CBC WITH DIFFERENTIAL/PLATELET
BASOS ABS: 0.1 10*3/uL (ref 0.0–0.1)
Basophils Relative: 1 %
Eosinophils Absolute: 0.1 10*3/uL (ref 0.0–0.7)
Eosinophils Relative: 1 %
HEMATOCRIT: 46.4 % (ref 39.0–52.0)
HEMOGLOBIN: 16.2 g/dL (ref 13.0–17.0)
LYMPHS ABS: 2 10*3/uL (ref 0.7–4.0)
LYMPHS PCT: 20 %
MCH: 32.3 pg (ref 26.0–34.0)
MCHC: 34.9 g/dL (ref 30.0–36.0)
MCV: 92.4 fL (ref 78.0–100.0)
MONO ABS: 0.9 10*3/uL (ref 0.1–1.0)
MONOS PCT: 8 %
Neutro Abs: 7.4 10*3/uL (ref 1.7–7.7)
Neutrophils Relative %: 70 %
Platelets: 238 10*3/uL (ref 150–400)
RBC: 5.02 MIL/uL (ref 4.22–5.81)
RDW: 13.2 % (ref 11.5–15.5)
WBC: 10.4 10*3/uL (ref 4.0–10.5)

## 2015-06-05 LAB — COMPREHENSIVE METABOLIC PANEL
ALBUMIN: 4 g/dL (ref 3.5–5.0)
ALK PHOS: 91 U/L (ref 38–126)
ALT: 5 U/L — ABNORMAL LOW (ref 17–63)
AST: 26 U/L (ref 15–41)
Anion gap: 5 (ref 5–15)
BILIRUBIN TOTAL: 1 mg/dL (ref 0.3–1.2)
BUN: 11 mg/dL (ref 6–20)
CALCIUM: 9 mg/dL (ref 8.9–10.3)
CO2: 27 mmol/L (ref 22–32)
Chloride: 107 mmol/L (ref 101–111)
Creatinine, Ser: 0.95 mg/dL (ref 0.61–1.24)
GFR calc Af Amer: >60 mL/min (ref >60)
GFR calc non Af Amer: >60 mL/min (ref >60)
GLUCOSE: 107 mg/dL — AB (ref 65–99)
Potassium: 4.2 mmol/L (ref 3.5–5.1)
Sodium: 139 mmol/L (ref 135–145)
TOTAL PROTEIN: 7.1 g/dL (ref 6.5–8.1)

## 2015-06-05 LAB — URINALYSIS, ROUTINE W REFLEX MICROSCOPIC
Bilirubin Urine: NEGATIVE
GLUCOSE, UA: NEGATIVE mg/dL
Hgb urine dipstick: NEGATIVE
KETONES UR: NEGATIVE mg/dL
LEUKOCYTES UA: NEGATIVE
NITRITE: NEGATIVE
PH: 7.5 (ref 5.0–8.0)
PROTEIN: NEGATIVE mg/dL
Specific Gravity, Urine: 1.015 (ref 1.005–1.030)
Urobilinogen, UA: 0.2 mg/dL (ref 0.0–1.0)

## 2015-06-05 LAB — I-STAT TROPONIN, ED
TROPONIN I, POC: 0 ng/mL (ref 0.00–0.08)
Troponin i, poc: 0 ng/mL (ref 0.00–0.08)

## 2015-06-05 MED ORDER — MOMETASONE FURO-FORMOTEROL FUM 100-5 MCG/ACT IN AERO
INHALATION_SPRAY | RESPIRATORY_TRACT | Status: AC
  Filled 2015-06-05: qty 8.8

## 2015-06-05 MED ORDER — MOMETASONE FURO-FORMOTEROL FUM 100-5 MCG/ACT IN AERO
2.0000 | INHALATION_SPRAY | Freq: Two times a day (BID) | RESPIRATORY_TRACT | Status: DC
  Administered 2015-06-05 – 2015-06-07 (×4): 2 via RESPIRATORY_TRACT
  Filled 2015-06-05: qty 8.8

## 2015-06-05 MED ORDER — GABAPENTIN 300 MG PO CAPS
300.0000 mg | ORAL_CAPSULE | Freq: Three times a day (TID) | ORAL | Status: DC
  Administered 2015-06-05 – 2015-06-07 (×5): 300 mg via ORAL
  Filled 2015-06-05 (×5): qty 1

## 2015-06-05 MED ORDER — ENOXAPARIN SODIUM 40 MG/0.4ML ~~LOC~~ SOLN
40.0000 mg | SUBCUTANEOUS | Status: DC
  Administered 2015-06-05 – 2015-06-06 (×2): 40 mg via SUBCUTANEOUS
  Filled 2015-06-05 (×2): qty 0.4

## 2015-06-05 MED ORDER — PAROXETINE HCL 20 MG PO TABS
60.0000 mg | ORAL_TABLET | ORAL | Status: DC
  Administered 2015-06-06 – 2015-06-07 (×2): 60 mg via ORAL
  Filled 2015-06-05 (×2): qty 3
  Filled 2015-06-05: qty 2

## 2015-06-05 MED ORDER — ROPINIROLE HCL 1 MG PO TABS
1.0000 mg | ORAL_TABLET | Freq: Two times a day (BID) | ORAL | Status: DC
  Administered 2015-06-06 – 2015-06-07 (×3): 1 mg via ORAL
  Filled 2015-06-05 (×4): qty 1

## 2015-06-05 MED ORDER — ONDANSETRON HCL 4 MG PO TABS: 4.0000 mg | ORAL_TABLET | Freq: Four times a day (QID) | ORAL | Status: DC | PRN

## 2015-06-05 MED ORDER — SODIUM CHLORIDE 0.9 % IV SOLN
INTRAVENOUS | Status: DC
  Administered 2015-06-05 – 2015-06-06 (×3): via INTRAVENOUS

## 2015-06-05 MED ORDER — CLONAZEPAM 0.5 MG PO TABS
0.5000 mg | ORAL_TABLET | Freq: Two times a day (BID) | ORAL | Status: DC | PRN
  Administered 2015-06-06 – 2015-06-07 (×2): 0.5 mg via ORAL
  Filled 2015-06-05 (×4): qty 1

## 2015-06-05 MED ORDER — ALBUTEROL SULFATE HFA 108 (90 BASE) MCG/ACT IN AERS
2.0000 | INHALATION_SPRAY | RESPIRATORY_TRACT | Status: DC | PRN
  Filled 2015-06-05: qty 6.7

## 2015-06-05 MED ORDER — ONDANSETRON HCL 4 MG/2ML IJ SOLN: 4.0000 mg | Freq: Four times a day (QID) | INTRAMUSCULAR | Status: DC | PRN

## 2015-06-05 MED ORDER — CARBIDOPA-LEVODOPA 25-250 MG PO TABS
1.0000 | ORAL_TABLET | Freq: Three times a day (TID) | ORAL | Status: DC
  Administered 2015-06-05 – 2015-06-07 (×5): 1 via ORAL
  Filled 2015-06-05 (×7): qty 1

## 2015-06-05 MED ORDER — ATORVASTATIN CALCIUM 10 MG PO TABS
10.0000 mg | ORAL_TABLET | Freq: Every day | ORAL | Status: DC
  Administered 2015-06-06: 10 mg via ORAL
  Filled 2015-06-05: qty 1

## 2015-06-05 MED ORDER — AMLODIPINE BESYLATE 5 MG PO TABS
2.5000 mg | ORAL_TABLET | Freq: Every day | ORAL | Status: DC
  Administered 2015-06-05 – 2015-06-07 (×2): 2.5 mg via ORAL
  Filled 2015-06-05 (×3): qty 1

## 2015-06-05 NOTE — ED Provider Notes (Signed)
CSN: [id number]     Arrival date & time 06/05/15  1747 History   First MD Initiated Contact with Patient 06/05/15 1755     Chief Complaint  Patient presents with  . Extremity Weakness     (Consider location/radiation/quality/duration/timing/severity/associated sxs/prior Treatment) HPI Comments: 69 y.o. Male with history of Parkinson's disease, CVA, COPD presents for left sided weakness and recurrent falls.  The patient and his son report that the patient has been more unsteady on his feet since yesterday and that he has had 4 falls since that time.  He also noted starting yesterday around 1 PM that he had increased weakness and tingling in the left arm.  He denies serious injury with his falls.  Says that he did hit his head.  He lives alone and did get himself up on his own.  Denies fever, chills, nausea, vomiting, diarrhea.  Reports that his left arm tingling and weakness has improved.  Patient is a 69 y.o. male presenting with extremity weakness.  Extremity Weakness Pertinent negatives include no chest pain, no abdominal pain, no headaches and no shortness of breath.    Past Medical History  Diagnosis Date  . Parkinson's disease ([place])   . COPD (chronic obstructive pulmonary disease) ([place])   . Tobacco abuse   . Alcohol use ([place])   . Depression with anxiety   . Cerebral ventriculomegaly 01/20/2015  . Acute ischemic stroke ([place]) 01/19/2015    Left midbrain/thalamus.   Past Surgical History  Procedure Laterality Date  . Eye surgery     History reviewed. No pertinent family history. Social History  Substance Use Topics  . Smoking status: Current Every Day Smoker -- 1.00 packs/day    Types: Cigarettes  . Smokeless tobacco: None  . Alcohol Use: Yes     Comment: beer occasionally    Review of Systems  Constitutional: Negative for fever, chills, diaphoresis and fatigue.  HENT: Negative for congestion, postnasal drip and rhinorrhea.   Eyes: Negative for redness and visual  disturbance.  Respiratory: Negative for cough, chest tightness and shortness of breath.   Cardiovascular: Negative for chest pain and palpitations.  Gastrointestinal: Negative for nausea, vomiting, abdominal pain and diarrhea.  Genitourinary: Negative for dysuria, urgency, frequency, hematuria and flank pain.  Musculoskeletal: Positive for extremity weakness. Negative for myalgias and back pain.  Neurological: Positive for dizziness (intermittent, resolved), speech difficulty (unchanged per patient and family since previous CVA), weakness (left arm and generalized) and numbness (tingling of the left arm - reports improved). Negative for syncope and headaches.      Allergies  Review of patient's allergies indicates no known allergies.  Home Medications   Prior to Admission medications   Medication Sig Start Date End Date Taking? Authorizing Provider  albuterol (PROVENTIL HFA;VENTOLIN HFA) 108 (90 BASE) MCG/ACT inhaler Inhale 2 puffs into the lungs every 4 (four) hours as needed for wheezing or shortness of breath. 01/21/15  Yes Rexene Alberts, MD  amLODipine (NORVASC) 2.5 MG tablet Take 1 tablet (2.5 mg total) by mouth daily. Medication to treat her high blood pressure. 01/21/15  Yes Rexene Alberts, MD  atorvastatin (LIPITOR) 10 MG tablet Take 1 tablet (10 mg total) by mouth daily at 6 PM. Medication for your slightly elevated cholesterol level and for stroke. 01/21/15  Yes Rexene Alberts, MD  carbidopa-levodopa (SINEMET IR) 25-250 MG per tablet Take 1 tablet by mouth 3 (three) times daily.   Yes Historical Provider, MD  clonazePAM (KLONOPIN) 0.5 MG tablet Take 0.5 mg by mouth  2 (two) times daily as needed for anxiety.   Yes Historical Provider, MD  gabapentin (NEURONTIN) 300 MG capsule Take 300 mg by mouth 3 (three) times daily.   Yes Historical Provider, MD  mometasone-formoterol (DULERA) 100-5 MCG/ACT AERO Inhale 2 puffs into the lungs 2 (two) times daily. For treatment of her chronic bronchitis.  01/21/15  Yes Rexene Alberts, MD  PARoxetine (PAXIL) 40 MG tablet Take 60 mg by mouth every morning.    Yes Historical Provider, MD  rOPINIRole (REQUIP) 1 MG tablet Take 1 mg by mouth 2 (two) times daily.   Yes Historical Provider, MD  aspirin EC 81 MG EC tablet Take 1 tablet (81 mg total) by mouth daily. Patient not taking: Reported on 06/05/2015 01/16/15   Erline Hau, MD   BP 145/79 mmHg  Pulse 73  Temp(Src) 97.7 F (36.5 C) (Oral)  Resp 18  Ht 5\' 7"  (1.702 m)  Wt 183 lb (83.008 kg)  BMI 28.66 kg/m2  SpO2 91% Physical Exam  Constitutional: He is oriented to person, place, and time. No distress.  HENT:  Head: Normocephalic and atraumatic.  Right Ear: External ear normal.  Left Ear: External ear normal.  Mouth/Throat: Oropharynx is clear and moist. No oropharyngeal exudate.  Eyes: Right pupil is round. Left pupil is not round. Pupils are unequal.  Neck: Normal range of motion. No spinous process tenderness and no muscular tenderness present.  Cardiovascular: Normal rate, regular rhythm and intact distal pulses.   Pulmonary/Chest: Effort normal. No respiratory distress. He has no wheezes. He has no rales.  Abdominal: Soft. He exhibits no distension. There is no tenderness.  Musculoskeletal: He exhibits no edema or tenderness.  Neurological: He is alert and oriented to person, place, and time. No cranial nerve deficit. He exhibits normal muscle tone.  Patient with significant tremor in the left arm and leg.  Difficulty with finger to nose examination but more so on the left side which could be related to tremor.  Unsteady on feet when ambulated with assistance  Skin: He is not diaphoretic.  Vitals reviewed.   ED Course  Procedures (including critical care time) Labs Review Labs Reviewed  COMPREHENSIVE METABOLIC PANEL - Abnormal; Notable for the following:    Glucose, Bld 107 (*)    ALT 5 (*)    All other components within normal limits  CBC WITH  DIFFERENTIAL/PLATELET  URINALYSIS, ROUTINE W REFLEX MICROSCOPIC (NOT AT [facility])  CBC  CREATININE, SERUM  COMPREHENSIVE METABOLIC PANEL  CBC  I-STAT TROPOININ, ED  I-STAT TROPOININ, ED    Imaging Review Dg Chest 2 View  06/05/2015   CLINICAL DATA:  Numbness and tingling in the left arm for 1 day. Patient fell yesterday and today. Generalized weakness.  EXAM: CHEST  2 VIEW  COMPARISON:  Chest x-rays dated 01/19/2015 and 01/15/2015  FINDINGS: The heart size and mediastinal contours are within normal limits. Both lungs are clear. The visualized skeletal structures are unremarkable.  IMPRESSION: No active cardiopulmonary disease.   Electronically Signed   By: Lorriane Shire M.D.   On: 06/05/2015 19:20   Ct Head Wo Contrast  06/05/2015   CLINICAL DATA:  69 year old male with numbness and tingling in the left arm since 1 p.m. yesterday afternoon. Weakness all over. Stroke 1 month ago with slurred speech.  EXAM: CT HEAD WITHOUT CONTRAST  TECHNIQUE: Contiguous axial images were obtained from the base of the skull through the vertex without intravenous contrast.  COMPARISON:  Head CT 01/19/2015.  FINDINGS:  Well-defined focus of low attenuation in the left thalamus is compatible with an old lacunar infarct. Several other well-defined foci of low attenuation are noted in the basal ganglia bilaterally, compatible with old lacunar infarcts. Patchy and confluent areas of decreased attenuation are noted throughout the deep and periventricular white matter of the cerebral hemispheres bilaterally, compatible with chronic microvascular ischemic disease. Mild cerebral atrophy with associated ex vacuo dilatation of the ventricular system, similar to the prior study. No definite signs of acute/subacute cerebral ischemia, no acute intracranial hemorrhage, no mass or mass effect and no definite hydrocephalus. No acute displaced skull fractures are identified. Visualized paranasal sinuses are generally well pneumatized. Right  mastoid is well pneumatized. Large left mastoid effusion appears similar to the prior study.  IMPRESSION: 1. No acute intracranial abnormalities. 2. Multiple old lacunar infarcts in the basal ganglia and left thalamus. 3. Cerebral atrophy with ex vacuo dilatation of the ventricular system and mild chronic microvascular ischemic changes in cerebral white matter.   Electronically Signed   By: Vinnie Langton M.D.   On: 06/05/2015 19:01   I have personally reviewed and evaluated these images and lab results as part of my medical decision-making.   EKG Interpretation   Date/Time:  Sunday June 05 2015 18:19:36 EDT Ventricular Rate:  80 PR Interval:  172 QRS Duration: 81 QT Interval:  372 QTC Calculation: 429 R Axis:   37 Text Interpretation:  Sinus rhythm  No significant change from previous  tracing Confirmed by NGUYEN, EMILY ([id number]) on 06/05/2015 9:39:25 PM      MDM  Patient seen and evaluated in stable condition.  Labs and imaging unremarkable.  History concerning for possible recurrent CVA.  Discussed with hospitalist who agreed with admission and the patient was admitted for overnight monitoring and for MRI to evaluate for possible CVA.  Patient and son updated on results and plan of care. Final diagnoses:  Left-sided weakness  Recurrent falls    1. Left sided weakness  2. Recurrent falls    Harvel Quale, MD 06/05/15 2141

## 2015-06-05 NOTE — ED Notes (Signed)
Ambulated pt.  Shaking and unsteady on feet.  Able to ambulate with assistance

## 2015-06-05 NOTE — H&P (Signed)
[facility] History and Physical  Lucas [name]:[id number] DOB: [birth year] DOA: 06/05/2015  Referring physician: ER PCP: [name], MD   Chief Complaint: Falls  HPI: Lucas [name] is a 69 y.o. male  This 69 year old man has had several falls in the last 48 hours. He has never loss consciousness with any of them. He denies any chest pain, palpitations with any of them. He says that he felt weak in his legs and that's why he fell. He has a history of left midbrain/thalamus CVA previously. This man also has Parkinson's disease. He is now being admitted for further investigation of his falls.   Review of Systems:  Apart from symptoms above, all systems are negative  Past Medical History  Diagnosis Date  . Parkinson's disease ([place])   . COPD (chronic obstructive pulmonary disease) ([place])   . Tobacco abuse   . Alcohol use ([place])   . Depression with anxiety   . Cerebral ventriculomegaly 01/20/2015  . Acute ischemic stroke ([place]) 01/19/2015    Left midbrain/thalamus.   Past Surgical History  Procedure Laterality Date  . Eye surgery     Social History:  reports that he has been smoking Cigarettes.  He has been smoking about 1.00 pack per day. He does not have any smokeless tobacco history on file. He reports that he drinks alcohol. He reports that he does not use illicit drugs.  No Known Allergies   Family history: No family history of Parkinson's disease.   Prior to Admission medications   Medication Sig Start Date End Date Taking? Authorizing Provider  albuterol (PROVENTIL HFA;VENTOLIN HFA) 108 (90 BASE) MCG/ACT inhaler Inhale 2 puffs into the lungs every 4 (four) hours as needed for wheezing or shortness of breath. 01/21/15  Yes Rexene Alberts, MD  amLODipine (NORVASC) 2.5 MG tablet Take 1 tablet (2.5 mg total) by mouth daily. Medication to treat her high blood pressure. 01/21/15  Yes Rexene Alberts, MD  atorvastatin (LIPITOR) 10 MG tablet Take 1 tablet (10 mg total) by  mouth daily at 6 PM. Medication for your slightly elevated cholesterol level and for stroke. 01/21/15  Yes Rexene Alberts, MD  carbidopa-levodopa (SINEMET IR) 25-250 MG per tablet Take 1 tablet by mouth 3 (three) times daily.   Yes Historical Provider, MD  clonazePAM (KLONOPIN) 0.5 MG tablet Take 0.5 mg by mouth 2 (two) times daily as needed for anxiety.   Yes Historical Provider, MD  gabapentin (NEURONTIN) 300 MG capsule Take 300 mg by mouth 3 (three) times daily.   Yes Historical Provider, MD  mometasone-formoterol (DULERA) 100-5 MCG/ACT AERO Inhale 2 puffs into the lungs 2 (two) times daily. For treatment of her chronic bronchitis. 01/21/15  Yes Rexene Alberts, MD  PARoxetine (PAXIL) 40 MG tablet Take 60 mg by mouth every morning.    Yes Historical Provider, MD  rOPINIRole (REQUIP) 1 MG tablet Take 1 mg by mouth 2 (two) times daily.   Yes Historical Provider, MD  aspirin EC 81 MG EC tablet Take 1 tablet (81 mg total) by mouth daily. Patient not taking: Reported on 06/05/2015 01/16/15   Erline Hau, MD   Physical Exam: Filed Vitals:   06/05/15 1945 06/05/15 2000 06/05/15 2015 06/05/15 2030  BP:  163/109  145/79  Pulse: 76 81 76 73  Temp:      TempSrc:      Resp:    18  Height:      Weight:      SpO2: 97% 91% 94%  91%    Wt Readings from Last 3 Encounters:  06/05/15 83.008 kg (183 lb)  01/19/15 83.3 kg (183 lb 10.3 oz)  01/16/15 79.9 kg (176 lb 2.4 oz)    General:  Appears calm and comfortable Eyes: PERRL, normal lids, irises & conjunctiva ENT: grossly normal hearing, lips & tongue Neck: no LAD, masses or thyromegaly Cardiovascular: RRR, no m/r/g. No LE edema. Telemetry: SR, no arrhythmias  Respiratory: CTA bilaterally, no w/r/r. Normal respiratory effort. Abdomen: soft, ntnd Skin: no rash or induration seen on limited exam Musculoskeletal: grossly normal tone BUE/BLE Psychiatric: grossly normal mood and affect, speech fluent and appropriate Neurologic: grossly  non-focal.          Labs on Admission:  Basic Metabolic Panel:  Recent Labs Lab 06/05/15 1836  NA 139  K 4.2  CL 107  CO2 27  GLUCOSE 107*  BUN 11  CREATININE 0.95  CALCIUM 9.0   Liver Function Tests:  Recent Labs Lab 06/05/15 1836  AST 26  ALT 5*  ALKPHOS 91  BILITOT 1.0  PROT 7.1  ALBUMIN 4.0   No results for input(s): LIPASE, AMYLASE in the last 168 hours. No results for input(s): AMMONIA in the last 168 hours. CBC:  Recent Labs Lab 06/05/15 1836  WBC 10.4  NEUTROABS 7.4  HGB 16.2  HCT 46.4  MCV 92.4  PLT 238   Cardiac Enzymes: No results for input(s): CKTOTAL, CKMB, CKMBINDEX, TROPONINI in the last 168 hours.  BNP (last 3 results)  Recent Labs  01/15/15 2116  BNP 35.0    ProBNP (last 3 results) No results for input(s): PROBNP in the last 8760 hours.  CBG: No results for input(s): GLUCAP in the last 168 hours.  Radiological Exams on Admission: Dg Chest 2 View  06/05/2015   CLINICAL DATA:  Numbness and tingling in the left arm for 1 day. Patient fell yesterday and today. Generalized weakness.  EXAM: CHEST  2 VIEW  COMPARISON:  Chest x-rays dated 01/19/2015 and 01/15/2015  FINDINGS: The heart size and mediastinal contours are within normal limits. Both lungs are clear. The visualized skeletal structures are unremarkable.  IMPRESSION: No active cardiopulmonary disease.   Electronically Signed   By: Lorriane Shire M.D.   On: 06/05/2015 19:20   Ct Head Wo Contrast  06/05/2015   CLINICAL DATA:  69 year old male with numbness and tingling in the left arm since 1 p.m. yesterday afternoon. Weakness all over. Stroke 1 month ago with slurred speech.  EXAM: CT HEAD WITHOUT CONTRAST  TECHNIQUE: Contiguous axial images were obtained from the base of the skull through the vertex without intravenous contrast.  COMPARISON:  Head CT 01/19/2015.  FINDINGS: Well-defined focus of low attenuation in the left thalamus is compatible with an old lacunar infarct. Several  other well-defined foci of low attenuation are noted in the basal ganglia bilaterally, compatible with old lacunar infarcts. Patchy and confluent areas of decreased attenuation are noted throughout the deep and periventricular white matter of the cerebral hemispheres bilaterally, compatible with chronic microvascular ischemic disease. Mild cerebral atrophy with associated ex vacuo dilatation of the ventricular system, similar to the prior study. No definite signs of acute/subacute cerebral ischemia, no acute intracranial hemorrhage, no mass or mass effect and no definite hydrocephalus. No acute displaced skull fractures are identified. Visualized paranasal sinuses are generally well pneumatized. Right mastoid is well pneumatized. Large left mastoid effusion appears similar to the prior study.  IMPRESSION: 1. No acute intracranial abnormalities. 2. Multiple old lacunar infarcts in  the basal ganglia and left thalamus. 3. Cerebral atrophy with ex vacuo dilatation of the ventricular system and mild chronic microvascular ischemic changes in cerebral white matter.   Electronically Signed   By: Vinnie Langton M.D.   On: 06/05/2015 19:01    EKG: Independently reviewed. Sinus rhythm without any acute ST-T wave changes.  Assessment/Plan   1. Multiple falls. Etiology is not entirely clear to me. He could've had another acute CVA and an MRI brain scan is ordered. I will request neurology consultation in the morning. 2. Parkinson's disease. The other possibility of his multiple falls is that his Parkinson's has become worse and he needs titration of his medications. Again, neurology consultation has been requested.  He'll be admitted to the medical floor. Further recommendations will depend on patient's hospital progress.   Code Status: Full code.   DVT Prophylaxis: Lovenox  Family Communication: I discussed the plan with the patient at the bedside.  Disposition Plan: Home when medically stable  Time  spent: 60 minutes.  Doree Albee [facility] Pager [phone].

## 2015-06-05 NOTE — ED Notes (Signed)
Pt states that he has been having numbness and tingling in his left arm since 1300 yesterday afternoon.  States that he feels weak all over.  Had a stroke 1 month and son states that his speech was left affected.  Slurred speech at baseline.

## 2015-06-06 ENCOUNTER — Inpatient Hospital Stay (HOSPITAL_COMMUNITY): Payer: Medicare Other

## 2015-06-06 DIAGNOSIS — E785 Hyperlipidemia, unspecified: Secondary | ICD-10-CM

## 2015-06-06 DIAGNOSIS — I10 Essential (primary) hypertension: Secondary | ICD-10-CM | POA: Diagnosis present

## 2015-06-06 DIAGNOSIS — G20 Parkinson's disease: Secondary | ICD-10-CM

## 2015-06-06 DIAGNOSIS — I639 Cerebral infarction, unspecified: Principal | ICD-10-CM

## 2015-06-06 DIAGNOSIS — J449 Chronic obstructive pulmonary disease, unspecified: Secondary | ICD-10-CM

## 2015-06-06 DIAGNOSIS — R296 Repeated falls: Secondary | ICD-10-CM | POA: Diagnosis present

## 2015-06-06 DIAGNOSIS — W19XXXA Unspecified fall, initial encounter: Secondary | ICD-10-CM | POA: Diagnosis present

## 2015-06-06 DIAGNOSIS — W19XXXD Unspecified fall, subsequent encounter: Secondary | ICD-10-CM

## 2015-06-06 HISTORY — DX: Cerebral infarction, unspecified: I63.9

## 2015-06-06 LAB — CBC
HEMATOCRIT: 42.4 % (ref 39.0–52.0)
Hemoglobin: 14.4 g/dL (ref 13.0–17.0)
MCH: 31.7 pg (ref 26.0–34.0)
MCHC: 34 g/dL (ref 30.0–36.0)
MCV: 93.4 fL (ref 78.0–100.0)
Platelets: 217 10*3/uL (ref 150–400)
RBC: 4.54 MIL/uL (ref 4.22–5.81)
RDW: 13.3 % (ref 11.5–15.5)
WBC: 8.4 10*3/uL (ref 4.0–10.5)

## 2015-06-06 LAB — COMPREHENSIVE METABOLIC PANEL
ALT: 7 U/L — AB (ref 17–63)
AST: 20 U/L (ref 15–41)
Albumin: 3.4 g/dL — ABNORMAL LOW (ref 3.5–5.0)
Alkaline Phosphatase: 82 U/L (ref 38–126)
Anion gap: 5 (ref 5–15)
BUN: 11 mg/dL (ref 6–20)
CHLORIDE: 109 mmol/L (ref 101–111)
CO2: 24 mmol/L (ref 22–32)
Calcium: 7.5 mg/dL — ABNORMAL LOW (ref 8.9–10.3)
Creatinine, Ser: 1 mg/dL (ref 0.61–1.24)
Glucose, Bld: 105 mg/dL — ABNORMAL HIGH (ref 65–99)
POTASSIUM: 3.5 mmol/L (ref 3.5–5.1)
SODIUM: 138 mmol/L (ref 135–145)
Total Bilirubin: 1.2 mg/dL (ref 0.3–1.2)
Total Protein: 6.2 g/dL — ABNORMAL LOW (ref 6.5–8.1)

## 2015-06-06 MED ORDER — ALBUTEROL SULFATE (2.5 MG/3ML) 0.083% IN NEBU
2.5000 mg | INHALATION_SOLUTION | RESPIRATORY_TRACT | Status: DC | PRN
Start: 2015-06-06 — End: 2015-06-07

## 2015-06-06 MED ORDER — ASPIRIN 325 MG PO TABS
325.0000 mg | ORAL_TABLET | Freq: Every day | ORAL | Status: DC
  Administered 2015-06-06 – 2015-06-07 (×2): 325 mg via ORAL
  Filled 2015-06-06 (×2): qty 1

## 2015-06-06 MED ORDER — CLONAZEPAM 0.5 MG PO TABS
0.5000 mg | ORAL_TABLET | Freq: Once | ORAL | Status: AC
  Administered 2015-06-06: 0.5 mg via ORAL

## 2015-06-06 NOTE — Evaluation (Addendum)
Physical Therapy Evaluation Patient Details Name: Lucas [name] MRN: [id number] DOB: 1946/07/01 Today's Date: 06/06/2015   History of Present Illness  This 69 year old man has had several falls in the last 48 hours. He has never loss consciousness with any of them. He denies any chest pain, palpitations with any of them. He says that he felt weak in his legs and that's why he fell. He has a history of left midbrain/thalamus CVA previously. This man also has Parkinson's disease. He is now being admitted for further investigation of his falls.  Clinical Impression   Pt was seen for evaluation.  He was alert and oriented, able to follow all direction.  He was actually evaluated by this therapist last May after his thalamic stroke and found to have no significant abnormalities other than mild balance deficit for which he needed a walker for gait.  Today, his strength is WNL and equal from left to right.  His coordination is mildly impaired.  His balance is impaired in sitting and standing.  Gait is quite unstable even with a walker.  He totally lost his balance x2 during gait with a walker and had to be fully stabilized by therapist.  He is not able to flex his right hip well enough to clear his foot off of the floor and this causes him to lose his balance.  His righting reflexes are poor and he is unable to stop himself from falling.  He is going for MRI this AM to evaluate for a new stroke.  He is absolutely not safe to be living alone and will continue to fall if he is ambulating without assist.  He would benefit from SNF at d/c.  If this is not possible, ACLF might be a good alternative.  He may need a w/c to use until his gait becomes more stable.    Follow Up Recommendations Supervision/Assistance - 24 hour (ACLF v.s. SNF...[facility] is unsafe to be living alone)    Financial risk analyst (measurements PT) (to be determined)    Recommendations for Other Services   OT    Precautions  / Restrictions Precautions Precautions: Fall Restrictions Weight Bearing Restrictions: No      Mobility  Bed Mobility Overal bed mobility: Modified Independent             General bed mobility comments: very slow and labored  Transfers Overall transfer level: Needs assistance Equipment used: Rolling walker (2 wheeled) Transfers: Sit to/from Stand Sit to Stand: Min guard            Ambulation/Gait Ambulation/Gait assistance: Mod assist Ambulation Distance (Feet): 40 Feet Assistive device: Rolling walker (2 wheeled) Gait Pattern/deviations: Decreased dorsiflexion - right;Staggering right;Decreased weight shift to right   Gait velocity interpretation: <1.8 ft/sec, indicative of risk for recurrent falls General Gait Details: tends to drag his RLE which causes him to lose his balance to the right...he needed total righting by therapist twice during gait of 40' due to loss of balance                        Balance Overall balance assessment: Needs assistance Sitting-balance support: No upper extremity supported;Feet supported Sitting balance-Leahy Scale: Fair Sitting balance - Comments: tends to fall backward   Standing balance support: No upper extremity supported Standing balance-Leahy Scale: Poor Standing balance comment: falls to the right  Pertinent Vitals/Pain Pain Assessment: No/denies pain    Home Living Family/patient expects to be discharged to:: Unsure Living Arrangements: Alone Available Help at Discharge: Family;Available PRN/intermittently Type of Home: Mobile home Home Access: Stairs to enter Entrance Stairs-Rails: Right Entrance Stairs-Number of Steps: 6 Home Layout: One level Home Equipment: Walker - 2 wheels;Cane - single point;Shower seat      Prior Function Level of Independence: Independent with assistive device(s)         Comments: ambulates with a cane or walker at home, sister  drives him to the grocery store     Hand Dominance   Dominant Hand: Right    Extremity/Trunk Assessment   Upper Extremity Assessment: Overall WFL for tasks assessed (coordination is WNL)           Lower Extremity Assessment: Overall WFL for tasks assessed (ccordination is WNL)      Cervical / Trunk Assessment: Kyphotic  Communication   Communication:  (mild slurring of speech)  Cognition Arousal/Alertness: Awake/alert Behavior During Therapy: WFL for tasks assessed/performed Overall Cognitive Status: Within Functional Limits for tasks assessed                                    Assessment/Plan    PT Assessment Patient needs continued PT services  PT Diagnosis Difficulty walking;Abnormality of gait   PT Problem List Decreased balance;Decreased mobility;Decreased safety awareness;Impaired tone  PT Treatment Interventions Gait training;Stair training;Functional mobility training;Neuromuscular re-education   PT Goals (Current goals can be found in the Care Plan section) Acute Rehab PT Goals Patient Stated Goal: none stated PT Goal Formulation: With patient Time For Goal Achievement: 06/20/15 Potential to Achieve Goals: Fair    Frequency Min 5X/week   Barriers to discharge Inaccessible home environment;Decreased caregiver support lives alone, 6 steps to enter his home                   End of Session Equipment Utilized During Treatment: Gait belt Activity Tolerance: Patient tolerated treatment well Patient left: in chair (in w/c to be transported to MRI) Nurse Communication: Mobility status    Functional Assessment Tool Used: clinical judgement Functional Limitation: Mobility: Walking and moving around Mobility: Walking and Moving Around Current Status (H6314): At least 60 percent but less than 80 percent impaired, limited or restricted Mobility: Walking and Moving Around Goal Status [phone]): At least 40 percent but less than 60 percent  impaired, limited or restricted    Time: 3785-8850 PT Time Calculation (min) (ACUTE ONLY): 37 min   Charges:   PT Evaluation $Initial PT Evaluation Tier I: 1 Procedure     PT G Codes:   PT G-Codes **NOT FOR INPATIENT CLASS** Functional Assessment Tool Used: clinical judgement Functional Limitation: Mobility: Walking and moving around Mobility: Walking and Moving Around Current Status (Y7741): At least 60 percent but less than 80 percent impaired, limited or restricted Mobility: Walking and Moving Around Goal Status [phone]): At least 40 percent but less than 60 percent impaired, limited or restricted    Sable Feil  PT 06/06/2015, 9:22 AM [phone]

## 2015-06-06 NOTE — Progress Notes (Signed)
[facility] PROGRESS NOTE  Lucas [name]:[id number] DOB: 12-15-1945 DOA: 06/05/2015 PCP: [name], MD  Assessment/Plan: 1. Acute CVA, revealed on MRI brain. Neurology consult requested. Prior to arrival he reports left handed weakness, which has resolved. Carotid US and ECHO was recently done 12/2014. Will check lipid panel. Continue daily ASA.  2. Multiple falls. Etiology is not entirely clear. Possibly related to CVA and unsteady gait related to Parkinson's. CT head and CXR were both unremarkable. PT has evaluated the patient and recommended ACLF vs. SNF. Although patient refuses any placements. 3. Parkinson's disease. It is reported that his Parkinson's has gotten worse and may need titration of his medications. Await further input from neurology.  4. Dysarthria, mild. From previous stroke 12/2014. 5. HTN. Possible issues of noncompliance. BP is currently stable. Will continue current treatments.  6. COPD. Stable. No evidence of wheezing.  7. HLD. Continue statins 8. Tobacco abuse. Counseled on smoking risk and cessation.   Code Status: Full  DVT prophylaxis: Lovenox Family Communication: Family bedside. Discussed with patient who understands and has no concerns at this time. Disposition Plan: Anticipate discharge within 24 hours.   Consultants:  PT- Recommended ACLF vs SNF  Procedures:    Antibiotics:    HPI/Subjective: Recalls falls, he reports for no apparent reason he has episodes of just falling. Noticed increase in losing his balance onset last week. He has started using a walker and he has a cane. Denies LOC with falls. Denies any recent changes in his Sinemet. He has lightheadedness when standing too quickly. Believes he may have had another stroke.   Family bedside reports what brought him in with this recent fall, he also complained of left sided weakness.     Objective: Filed Vitals:   06/06/15 0608  BP: 109/72  Pulse: 64  Temp: 97.7 F (36.5  C)  Resp: 20    Intake/Output Summary (Last 24 hours) at 06/06/15 0714 Last data filed at 06/06/15 0534  Gross per 24 hour  Intake 511.25 ml  Output    400 ml  Net 111.25 ml   Filed Weights   06/05/15 1758 06/05/15 2254  Weight: 83.008 kg (183 lb) 79.924 kg (176 lb 3.2 oz)    Exam:  General: NAD. Lying in bed and looks comfortable.  Cardiovascular: RRR, S1, S2   Respiratory: clear bilaterally, No wheezing, rales or rhonchi  Abdomen: soft, non tender, no distention , bowel sounds normal  Musculoskeletal: No edema b/l  Data Reviewed: Basic Metabolic Panel:  Recent Labs Lab 06/05/15 1836 06/06/15 0459  NA 139 138  K 4.2 3.5  CL 107 109  CO2 27 24  GLUCOSE 107* 105*  BUN 11 11  CREATININE 0.95 1.00  CALCIUM 9.0 7.5*   Liver Function Tests:  Recent Labs Lab 06/05/15 1836 06/06/15 0459  AST 26 20  ALT 5* 7*  ALKPHOS 91 82  BILITOT 1.0 1.2  PROT 7.1 6.2*  ALBUMIN 4.0 3.4*  CBC:  Recent Labs Lab 06/05/15 1836 06/06/15 0459  WBC 10.4 8.4  NEUTROABS 7.4  --   HGB 16.2 14.4  HCT 46.4 42.4  MCV 92.4 93.4  PLT 238 217   BNP (last 3 results)  Recent Labs  01/15/15 2116  BNP 35.0     Studies: Dg Chest 2 View  06/05/2015   CLINICAL DATA:  Numbness and tingling in the left arm for 1 day. Patient fell yesterday and today. Generalized weakness.  EXAM: CHEST  2 VIEW  COMPARISON:  Chest x-rays dated 01/19/2015 and 01/15/2015  FINDINGS: The heart size and mediastinal contours are within normal limits. Both lungs are clear. The visualized skeletal structures are unremarkable.  IMPRESSION: No active cardiopulmonary disease.   Electronically Signed   By: Lorriane Shire M.D.   On: 06/05/2015 19:20   Ct Head Wo Contrast  06/05/2015   CLINICAL DATA:  69 year old male with numbness and tingling in the left arm since 1 p.m. yesterday afternoon. Weakness all over. Stroke 1 month ago with slurred speech.  EXAM: CT HEAD WITHOUT CONTRAST  TECHNIQUE: Contiguous axial  images were obtained from the base of the skull through the vertex without intravenous contrast.  COMPARISON:  Head CT 01/19/2015.  FINDINGS: Well-defined focus of low attenuation in the left thalamus is compatible with an old lacunar infarct. Several other well-defined foci of low attenuation are noted in the basal ganglia bilaterally, compatible with old lacunar infarcts. Patchy and confluent areas of decreased attenuation are noted throughout the deep and periventricular white matter of the cerebral hemispheres bilaterally, compatible with chronic microvascular ischemic disease. Mild cerebral atrophy with associated ex vacuo dilatation of the ventricular system, similar to the prior study. No definite signs of acute/subacute cerebral ischemia, no acute intracranial hemorrhage, no mass or mass effect and no definite hydrocephalus. No acute displaced skull fractures are identified. Visualized paranasal sinuses are generally well pneumatized. Right mastoid is well pneumatized. Large left mastoid effusion appears similar to the prior study.  IMPRESSION: 1. No acute intracranial abnormalities. 2. Multiple old lacunar infarcts in the basal ganglia and left thalamus. 3. Cerebral atrophy with ex vacuo dilatation of the ventricular system and mild chronic microvascular ischemic changes in cerebral white matter.   Electronically Signed   By: Vinnie Langton M.D.   On: 06/05/2015 19:01    Scheduled Meds: . amLODipine  2.5 mg Oral Daily  . atorvastatin  10 mg Oral q1800  . carbidopa-levodopa  1 tablet Oral TID  . enoxaparin (LOVENOX) injection  40 mg Subcutaneous Q24H  . gabapentin  300 mg Oral TID  . mometasone-formoterol  2 puff Inhalation BID  . PARoxetine  60 mg Oral BH-q7a  . rOPINIRole  1 mg Oral BID   Continuous Infusions: . sodium chloride 75 mL/hr at 06/05/15 2245    Active Problems:   Parkinson's disease ([place])   Multiple falls    Time spent: 30 minutes     Kathie Dike, MD  [facility] Pager [phone]. If 7PM-7AM, please contact night-coverage at [email or web address], password [facility] 06/06/2015, 7:14 AM  LOS: 1 day     By signing my name below, I, Rennis Harding, attest that this documentation has been prepared under the direction and in the presence of Kathie Dike, MD. Electronically signed: Rennis Harding, Scribe. 06/06/2015 1:06 PM   I, Dr. Kathie Dike, personally performed the services described in this documentaiton. All medical record entries made by the scribe were at my direction and in my presence. I have reviewed the chart and agree that the record reflects my personal performance and is accurate and complete  Kathie Dike, MD, 06/06/2015 1:39 PM

## 2015-06-06 NOTE — Clinical Social Work Note (Signed)
Clinical Social Work Assessment  Patient Details  Name: Lucas [name] MRN: [id number] Date of Birth: 1945-09-11  Date of referral:  06/06/15               Reason for consult:  Facility Placement                Permission sought to share information with:    Permission granted to share information::     Name::        Agency::     Relationship::     Contact Information:     Housing/Transportation Living arrangements for the past 2 months:  Mobile Home Source of Information:  Patient Patient Interpreter Needed:  None Criminal Activity/Legal Involvement Pertinent to Current Situation/Hospitalization:  No - Comment as needed Significant Relationships:  Adult Children, Siblings Lives with:  Self Do you feel safe going back to the place where you live?  Yes Need for family participation in patient care:  Yes (Comment)  Care giving concerns:  Patient lives alone. He reports that his sister comes every other day.  He denied care giving issues.     Social Worker assessment / plan:  CSW met with patient. Patient advised that he lives alone.  He stated that his sister comes over every other day and more often if he needs her.  Patient indicated that his sister lives a short distance from him.  He stated that he ambulates with a walker and completes ADLs independently.  Patient stated that he has three adult children and the two children who live in the area assist him also.  CSW discussed the PT recommendation for SNF and ALF.  CSW also discussed with patient payor sources with SNF and ALF. Patient advised that his monthly income is $1,100.00. He advised that he owns his double wide mobile home. He stated that he does not want to go in to a facility if it means that he will lose his monthly check. Patient stated "I can care for myself pretty much, I just go sick and had to come here." CSW provided patient with both SNF and ALF list in case he thought about it and changed his mind.    Employment  status:  Retired Forensic scientist:  [employer] PT Recommendations:  [place] / Referral to community resources:  [place] ([place])  Patient/Family's Response to care:  Patient is not currently agreeable to going in to placement if he will lose his check as a result.   Patient/Family's Understanding of and Emotional Response to Diagnosis, Current Treatment, and Prognosis:  Patient understand his diagnosis, treatment and prognosis and indicates that he wants to remain in his own home in order to maintain his monthly check.   Emotional Assessment Appearance:  Appears stated age Attitude/Demeanor/Rapport:   (Cooperative) Affect (typically observed):  Calm Orientation:  Oriented to Self, Oriented to Place, Oriented to  Time, Oriented to Situation Alcohol / Substance use:  Not Applicable Psych involvement (Current and /or in the community):  No (Comment)  Discharge Needs  Concerns to be addressed:  Discharge Planning Concerns, Home Safety Concerns Readmission within the last 30 days:  No Current discharge risk:  Lives alone Barriers to Discharge:  No Barriers Identified   Ihor Gully, LCSW 06/06/2015, 11:12 AM [phone]

## 2015-06-06 NOTE — Care Management Note (Signed)
Case Management Note  Patient Details  Name: Lucas [name] MRN: [id number] Date of Birth: June 16, 1946  Subjective/Objective:                  Pt admitted from home with CVA, parkinsons, falls. Pt lives alone and has sons, daughter, and sister who is very active in the care of the pt. Pt stated that he has a ramp and walker for home use.   Action/Plan: PT recommends SNF vs ALf but pt is refusing. Pt wants to discharge home with [facility] PT and aide, and a w/c. Pt would like w/c from [facility] and pts granddaughter will pick w/c up (granddaughter works at [employer]). Order faxed for w/c to [facility]. Pt would like [place] services from [facility]. Referral called to [place] with [facility]. Alvis Lemmings will collect pts information from the chart. Anticipate discharge within 24 hours.  Expected Discharge Date:  06/06/15               Expected Discharge Plan:  [place]  In-House Referral:  Clinical Social Work  Discharge planning Services  CM Consult  Post Acute Care Choice:  Home Health, Durable Medical Equipment Choice offered to:  Patient  DME Arranged:  Programmer, multimedia DME Agency:  [facility]  HH Arranged:  PT, Nurse's Aide [facility] Agency:  [place]  Status of Service:  In process, will continue to follow  Medicare Important Message Given:    Date Medicare IM Given:    Medicare IM give by:    Date Additional Medicare IM Given:    Additional Medicare Important Message give by:     If discussed at [place] of Stay Meetings, dates discussed:    Additional Comments:  Joylene Draft, RN 06/06/2015, 3:23 PM

## 2015-06-06 NOTE — Consult Note (Signed)
[place] A. Lucas Laughter, MD     [email or web address]          Lucas [name] is an 69 y.o. male.   ASSESSMENT/PLAN: 1. Acute lacunar infarct. 2. Likely vascular dementia. 3. Parkinsonism.  RECOMMENDATION: The patient was strongly advised to be compliant with aspirin. He is to continue with other risk factor modifications including use of statin medication and blood pressure.  The patient is 69 year old white male who presents with acute episode of gait impairment and the repeated falls. The patient presented with similar complaints about 5 months ago. He was diagnosed as having acute lacunar infarct involving the brainstem. It appears that patient was noncompliant with aspirin at that time and the story is the same again. The patient does not given valid explanation as to why he was not taking his aspirin. He tells her that he was not available at the drugstore. The patient seems to have significant difficulties providing history. He seemed to have limited insight and memory impairment. The review of systems therefore limited. He does not report any symptoms or complaints however. He denies focal weakness or numbness. Headaches are not reported. He denies chest pain or shortness of breath.  GENERAL: The patient is in no acute distress.  HEENT: Supple. Atraumatic normocephalic.   ABDOMEN: soft  EXTREMITIES: No edema   BACK: Normal.  SKIN: Normal by inspection.    MENTAL STATUS: He is awake and alert. He has moderate to severe dysarthria. He does follow commands but seemed to have nonsensical speech at time.  CRANIAL NERVES: Pupils are equal, round and reactive to light and accommodation; extra ocular movements are full, there is no significant nystagmus; visual fields are full; upper and lower facial muscles are normal in strength and symmetric, there is no flattening of the nasolabial folds; tongue is midline; uvula is midline; shoulder elevation is normal.  MOTOR:  Normal tone, bulk and strength; no pronator drift.  COORDINATION: Left finger to nose is normal, right finger to nose is normal. The patient does have frequent but interrupted tremors mostly at rest involving the hands bilaterally more in the left side. They're characterized by moderate amplitude high frequency. There is significant cogwheeling bilaterally.  REFLEXES: Deep tendon reflexes are symmetrical and normal. Babinski reflexes are extensor bilaterally.   SENSATION: Normal to light touch.     [[[[[[[[[[[[[[[[[[[[[[[[[[[[MY NEURO CONSULT 12-2014 1. Acute infarct involving the left midbrain and thalamic regions. 2. Significant ventriculomegaly worrisome for possible normal pressure hydrocephalus. 3. Underlying Parkinson's disease. 4. Alcoholism. 5. Likely prediabetes. 6. Possible sleep disordered breathing.  RECOMMENDATION: Agree with aspirin 325 mg although per hx was not taking aspirin before.  Speech and occupational therapy along with physical therapy. Follow-up echocardiography and the carotid duplex Doppler. Reduction in alcohol use. Outpatient sleep study. Continue with current dose of Parkinson's medications. Workup of possible normal pressure hydrocephalus by primary neurologist once the patient has gone through the acute stroke phase. Obtain the CT angiography of the brain given that the MRA was of poor quality.  The patient is a 69 year old right handed white male who developed the acute onset of dysarthria a few days ago on Saturday. The patient was in the emergency room being worked up for these problems but he apparently left [place] because the MRI scan was not available and he would have to wait until Monday. It appears that he does have some baseline difficulties with moving around because of his Parkinson disease but he apparently is highly functional.  He walks unassisted and drives. No apparent cognitive impairments are reported. The patient was  brought back to the emergency room by his daughter and other children because of significantly worsened dysarthria, double weakness and this severe gait impairment. The patient sees Dr. Rob [name] in [place] as his primary neurologist. [name] for several years as he has been diagnosed with Parkinson disease for several years. The patient reports that his left side is more symptomatic. He also seemed to have difficulties with movements of the left leg presumed due to the Parkinson disease. The children report that they have been concerned about the patient having what appears to be witnessed apneas while sleeping. The review of systems is otherwise negative. No chest pain, shortness of breath or focal weakness as reported. No numbness. No GI or GU symptoms.]]]]]]]]]]]]]]]]]]]]]]]]]]]]]]]]]]]]]]]]  Blood pressure 125/75, pulse 69, temperature 98.1 F (36.7 C), temperature source Oral, resp. rate 20, height _0  (1.702 m), weight 79.924 kg (176 lb 3.2 oz), SpO2 99 %.  Past Medical History  Diagnosis Date  . Parkinson's disease ([place])   . COPD (chronic obstructive pulmonary disease) ([place])   . Tobacco abuse   . Alcohol use ([place])   . Depression with anxiety   . Cerebral ventriculomegaly 01/20/2015  . Acute ischemic stroke ([place]) 01/19/2015    Left midbrain/thalamus.    Past Surgical History  Procedure Laterality Date  . Eye surgery      History reviewed. No pertinent family history.  Social History:  reports that he has been smoking Cigarettes.  He has been smoking about 0.50 packs per day. He does not have any smokeless tobacco history on file. He reports that he drinks alcohol. He reports that he does not use illicit drugs.  Allergies: No Known Allergies  Medications: Prior to Admission medications   Medication Sig Start Date End Date Taking? Authorizing Provider  albuterol (PROVENTIL HFA;VENTOLIN HFA) 108 (90 BASE) MCG/ACT inhaler Inhale 2 puffs into the lungs every  4 (four) hours as needed for wheezing or shortness of breath. 01/21/15  Yes Rexene Alberts, MD  amLODipine (NORVASC) 2.5 MG tablet Take 1 tablet (2.5 mg total) by mouth daily. Medication to treat her high blood pressure. 01/21/15  Yes Rexene Alberts, MD  atorvastatin (LIPITOR) 10 MG tablet Take 1 tablet (10 mg total) by mouth daily at 6 PM. Medication for your slightly elevated cholesterol level and for stroke. 01/21/15  Yes Rexene Alberts, MD  carbidopa-levodopa (SINEMET IR) 25-250 MG per tablet Take 1 tablet by mouth 3 (three) times daily.   Yes Historical Provider, MD  clonazePAM (KLONOPIN) 0.5 MG tablet Take 0.5 mg by mouth 2 (two) times daily as needed for anxiety.   Yes Historical Provider, MD  gabapentin (NEURONTIN) 300 MG capsule Take 300 mg by mouth 3 (three) times daily.   Yes Historical Provider, MD  mometasone-formoterol (DULERA) 100-5 MCG/ACT AERO Inhale 2 puffs into the lungs 2 (two) times daily. For treatment of her chronic bronchitis. 01/21/15  Yes Rexene Alberts, MD  PARoxetine (PAXIL) 40 MG tablet Take 60 mg by mouth every morning.    Yes Historical Provider, MD  rOPINIRole (REQUIP) 1 MG tablet Take 1 mg by mouth 2 (two) times daily.   Yes Historical Provider, MD  aspirin EC 81 MG EC tablet Take 1 tablet (81 mg total) by mouth daily. Patient not taking: Reported on 06/05/2015 01/16/15   Erline Hau, MD    Scheduled Meds: . amLODipine  2.5 mg Oral  Daily  . aspirin  325 mg Oral Daily  . atorvastatin  10 mg Oral q1800  . carbidopa-levodopa  1 tablet Oral TID  . enoxaparin (LOVENOX) injection  40 mg Subcutaneous Q24H  . gabapentin  300 mg Oral TID  . mometasone-formoterol  2 puff Inhalation BID  . PARoxetine  60 mg Oral BH-q7a  . rOPINIRole  1 mg Oral BID   Continuous Infusions: . sodium chloride 75 mL/hr at 06/06/15 1227   PRN Meds:.albuterol, clonazePAM, ondansetron **OR** ondansetron (ZOFRAN) IV     Results for orders placed or performed during the hospital  encounter of 06/05/15 (from the past 48 hour(s))  Urinalysis, Routine w reflex microscopic (not at [facility])     Status: None   Collection Time: 06/05/15  6:21 PM  Result Value Ref Range   Color, Urine YELLOW YELLOW   APPearance CLEAR CLEAR   Specific Gravity, Urine 1.015 1.005 - 1.030   pH 7.5 5.0 - 8.0   Glucose, UA NEGATIVE NEGATIVE mg/dL   Hgb urine dipstick NEGATIVE NEGATIVE   Bilirubin Urine NEGATIVE NEGATIVE   Ketones, ur NEGATIVE NEGATIVE mg/dL   Protein, ur NEGATIVE NEGATIVE mg/dL   Urobilinogen, UA 0.2 0.0 - 1.0 mg/dL   Nitrite NEGATIVE NEGATIVE   Leukocytes, UA NEGATIVE NEGATIVE    Comment: MICROSCOPIC NOT DONE ON URINES WITH NEGATIVE PROTEIN, BLOOD, LEUKOCYTES, NITRITE, OR GLUCOSE <1000 mg/dL.  CBC with Differential     Status: None   Collection Time: 06/05/15  6:36 PM  Result Value Ref Range   WBC 10.4 4.0 - 10.5 K/uL   RBC 5.02 4.22 - 5.81 MIL/uL   Hemoglobin 16.2 13.0 - 17.0 g/dL   HCT 46.4 39.0 - 52.0 %   MCV 92.4 78.0 - 100.0 fL   MCH 32.3 26.0 - 34.0 pg   MCHC 34.9 30.0 - 36.0 g/dL   RDW 13.2 11.5 - 15.5 %   Platelets 238 150 - 400 K/uL   Neutrophils Relative % 70 %   Neutro Abs 7.4 1.7 - 7.7 K/uL   Lymphocytes Relative 20 %   Lymphs Abs 2.0 0.7 - 4.0 K/uL   Monocytes Relative 8 %   Monocytes Absolute 0.9 0.1 - 1.0 K/uL   Eosinophils Relative 1 %   Eosinophils Absolute 0.1 0.0 - 0.7 K/uL   Basophils Relative 1 %   Basophils Absolute 0.1 0.0 - 0.1 K/uL  Comprehensive metabolic panel     Status: Abnormal   Collection Time: 06/05/15  6:36 PM  Result Value Ref Range   Sodium 139 135 - 145 mmol/L   Potassium 4.2 3.5 - 5.1 mmol/L   Chloride 107 101 - 111 mmol/L   CO2 27 22 - 32 mmol/L   Glucose, Bld 107 (H) 65 - 99 mg/dL   BUN 11 6 - 20 mg/dL   Creatinine, Ser 0.95 0.61 - 1.24 mg/dL   Calcium 9.0 8.9 - 10.3 mg/dL   Total Protein 7.1 6.5 - 8.1 g/dL   Albumin 4.0 3.5 - 5.0 g/dL   AST 26 15 - 41 U/L   ALT 5 (L) 17 - 63 U/L   Alkaline Phosphatase 91 38 - 126  U/L   Total Bilirubin 1.0 0.3 - 1.2 mg/dL   GFR calc non Af Amer >60 >60 mL/min   GFR calc Af Amer >60 >60 mL/min    Comment: (NOTE) The eGFR has been calculated using the CKD EPI equation. This calculation has not been validated in all clinical situations. eGFR's persistently <60  mL/min signify possible Chronic Kidney Disease.    Anion gap 5 5 - 15  I-Stat Troponin, ED - 0, 3, 6 hours (not at [facility])     Status: None   Collection Time: 06/05/15  6:50 PM  Result Value Ref Range   Troponin i, poc 0.00 0.00 - 0.08 ng/mL   Comment 3            Comment: Due to the release kinetics of cTnI, a negative result within the first hours of the onset of symptoms does not rule out myocardial infarction with certainty. If myocardial infarction is still suspected, repeat the test at appropriate intervals.   I-Stat Troponin, ED - 0, 3, 6 hours (not at [facility])     Status: None   Collection Time: 06/05/15  9:17 PM  Result Value Ref Range   Troponin i, poc 0.00 0.00 - 0.08 ng/mL   Comment 3            Comment: Due to the release kinetics of cTnI, a negative result within the first hours of the onset of symptoms does not rule out myocardial infarction with certainty. If myocardial infarction is still suspected, repeat the test at appropriate intervals.   Comprehensive metabolic panel     Status: Abnormal   Collection Time: 06/06/15  4:59 AM  Result Value Ref Range   Sodium 138 135 - 145 mmol/L   Potassium 3.5 3.5 - 5.1 mmol/L   Chloride 109 101 - 111 mmol/L   CO2 24 22 - 32 mmol/L   Glucose, Bld 105 (H) 65 - 99 mg/dL   BUN 11 6 - 20 mg/dL   Creatinine, Ser 1.00 0.61 - 1.24 mg/dL   Calcium 7.5 (L) 8.9 - 10.3 mg/dL   Total Protein 6.2 (L) 6.5 - 8.1 g/dL   Albumin 3.4 (L) 3.5 - 5.0 g/dL   AST 20 15 - 41 U/L   ALT 7 (L) 17 - 63 U/L   Alkaline Phosphatase 82 38 - 126 U/L   Total Bilirubin 1.2 0.3 - 1.2 mg/dL   GFR calc non Af Amer >60 >60 mL/min   GFR calc Af Amer >60 >60 mL/min    Comment:  (NOTE) The eGFR has been calculated using the CKD EPI equation. This calculation has not been validated in all clinical situations. eGFR's persistently <60 mL/min signify possible Chronic Kidney Disease.    Anion gap 5 5 - 15  CBC     Status: None   Collection Time: 06/06/15  4:59 AM  Result Value Ref Range   WBC 8.4 4.0 - 10.5 K/uL   RBC 4.54 4.22 - 5.81 MIL/uL   Hemoglobin 14.4 13.0 - 17.0 g/dL   HCT 42.4 39.0 - 52.0 %   MCV 93.4 78.0 - 100.0 fL   MCH 31.7 26.0 - 34.0 pg   MCHC 34.0 30.0 - 36.0 g/dL   RDW 13.3 11.5 - 15.5 %   Platelets 217 150 - 400 K/uL    Studies/Results:  [[[[[[[[[BRAIN MRI Acute nonhemorrhagic infarct lateral aspect left thalamus bordering the posterior limb of the left internal capsule.  Question tiny subacute anterior right frontal infarct.  Remote bilateral thalamic infarcts.  Small vessel disease type changes.  No intracranial hemorrhage.  Atrophy with disproportionate enlargement of the lateral ventricles raises possibility of hydrocephalus (central atrophy is a secondary consideration). Appearance unchanged.]]]]]]]]]  CTA BRAIN 12-2014 1. Acute/subacute lab infarct is stable. 2. Stable ventricular enlargement. 3. Moderate proximal left M1 and high-grade distal left  disc stenosis. 4. Marked attenuation of left MCA branch vessels with prominent collateral vessels noted.   Ashutosh Dieguez A. Lucas [name], M.D.  Diplomate, Tax adviser of Psychiatry and Neurology ( Neurology). 06/06/2015, 5:51 PM

## 2015-06-07 LAB — LIPID PANEL
Cholesterol: 156 mg/dL (ref 0–200)
HDL: 29 mg/dL — AB (ref >40)
LDL Cholesterol: 97 mg/dL (ref 0–99)
Total CHOL/HDL Ratio: 5.4 RATIO
Triglycerides: 152 mg/dL — ABNORMAL HIGH (ref <150)
VLDL: 30 mg/dL (ref 0–40)

## 2015-06-07 MED ORDER — STROKE: EARLY STAGES OF RECOVERY BOOK
Freq: Once | Status: AC
  Administered 2015-06-07: 10:00:00
  Filled 2015-06-07: qty 1

## 2015-06-07 MED ORDER — ATORVASTATIN CALCIUM 20 MG PO TABS: 20.0000 mg | ORAL_TABLET | Freq: Every day | ORAL | Status: DC

## 2015-06-07 MED ORDER — ATORVASTATIN CALCIUM 20 MG PO TABS: 10.0000 mg | ORAL_TABLET | Freq: Every day | ORAL | Status: DC

## 2015-06-07 MED ORDER — MOMETASONE FURO-FORMOTEROL FUM 100-5 MCG/ACT IN AERO: 2.0000 | INHALATION_SPRAY | Freq: Two times a day (BID) | RESPIRATORY_TRACT | Status: DC

## 2015-06-07 NOTE — Care Management Note (Signed)
Case Management Note  Patient Details  Name: Lucas [name] MRN: [id number] Date of Birth: 1946/03/05  Expected Discharge Date:  06/06/15               Expected Discharge Plan:  [place]  In-House Referral:  Clinical Social Work  Discharge planning Services  CM Consult  Post Acute Care Choice:  Home Health, Durable Medical Equipment Choice offered to:  Patient  DME Arranged:  Programmer, multimedia DME Agency:  [place]  HH Arranged:  PT, Nurse's Aide [facility] Agency:  [place]  Status of Service:  Completed, signed off  Medicare Important Message Given:  N/A - LOS <3 / Initial given by admissions Date Medicare IM Given:    Medicare IM give by:    Date Additional Medicare IM Given:    Additional Medicare Important Message give by:     If discussed at [place] of Stay Meetings, dates discussed:    Additional Comments: Pt discharging home today with [facility] services through [facility]. Jeannette How, with [facility], made aware of discharge and will obtain pt info from chart. Pt aware [facility] has 48 hours to make first visit. Pt wheelchair will be picked up from [employer] from pt's granddaughter. No further CM needs identified.  Sherald Barge, RN 06/07/2015, 2:41 PM

## 2015-06-07 NOTE — Discharge Summary (Signed)
Physician Discharge Summary  Lucas [name] IDP:[id number] DOB: Feb 18, 1946 DOA: 06/05/2015  PCP: [name], MD  Admit date: 06/05/2015 Discharge date: 06/07/2015  Time spent: 35 minutes  Recommendations for Outpatient Follow-up:  1. Follow up with PCP within 1-2 weeks. 2. Patient has been set up with RN/home health PT.   Discharge Diagnoses:  Principal Problem:   Acute CVA (cerebrovascular accident) [facility]) Active Problems:   Parkinson's disease ([place])   Multiple falls   Essential hypertension   Hyperlipidemia   COPD (chronic obstructive pulmonary disease) ([place])   Falls   Discharge Condition: Improved  Diet recommendation: Heart healthy  Filed Weights   06/05/15 1758 06/05/15 2254  Weight: 83.008 kg (183 lb) 79.924 kg (176 lb 3.2 oz)    History of present illness:  69 year old male with history of acute ischemic stroke, COPD, Parkinson's disease, HTN, HLD and tobacco and alcohol abuse who presented to the ED with multiple falls. While in the hospital, MRI brain revealed acute CVA.  Hospital Course:  Patient is a 69 year old male with history of acute ischemic stroke, COPD, Parkinson's disease, HTN, HLD and tobacco and alcohol abuse presented to the ED for an acute CVA revealed on MRI brain. He was treated with daily ASA. Lipid panel showed elevated elevated LDL at 97. Lipitor was increased from 10 to 20 mg daily. Neurology strongly advised patient to comply with aspirin and other risk factor modifications including use of statin medication and blood pressure. Patient had multiple falls with unclear etiology but possibly related to CVA and Parkinson's disease. CT head and CXR were unremarkable. PT evaluated patient and recommended ACLF vs. SNF. Patient has refused and wishes to return home with home health services.  1. Parkinson's disease. Continue on Sinemet. Dysarthria, mild. From previous stroke 12/2014. 69. HTN. Possible issues of noncompliance. BP remained stable. Patient  has been non compliant but taking daily aspirin, therefore he will continue on his regular dose. Continued current treatments.  3. COPD. Remained stable. No evidence of wheezing.  4. HLD. Continued on statins 5. Tobacco abuse. Counseled on smoking risk and cessation.   Consultants:  PT- Recommended ACLF vs SNF  Neurology  Procedures:  None   Discharge Exam: Filed Vitals:   06/07/15 0539  BP: 124/87  Pulse: 58  Temp: 97.7 F (36.5 C)  Resp: 20     General: Appears calm and comfortable  Cardiovascular: RRR, no m/r/g. No LE edema.  Respiratory: CTA bilaterally, Mild expiratory wheezes. No r/r. Normal respiratory effort.  Musculoskeletal: grossly normal tone BUE/BLE  Psychiatric: grossly normal mood and affect, speech fluent and appropriate  Neurologic: grossly non-focal.  Discharge Instructions    Current Discharge Medication List    CONTINUE these medications which have NOT CHANGED   Details  albuterol (PROVENTIL HFA;VENTOLIN HFA) 108 (90 BASE) MCG/ACT inhaler Inhale 2 puffs into the lungs every 4 (four) hours as needed for wheezing or shortness of breath. Qty: 1 Inhaler, Refills: 3    amLODipine (NORVASC) 2.5 MG tablet Take 1 tablet (2.5 mg total) by mouth daily. Medication to treat her high blood pressure. Qty: 30 tablet, Refills: 3    atorvastatin (LIPITOR) 10 MG tablet Take 1 tablet (10 mg total) by mouth daily at 6 PM. Medication for your slightly elevated cholesterol level and for stroke. Qty: 30 tablet, Refills: 3    carbidopa-levodopa (SINEMET IR) 25-250 MG per tablet Take 1 tablet by mouth 3 (three) times daily.    clonazePAM (KLONOPIN) 0.5 MG tablet  Take 0.5 mg by mouth 2 (two) times daily as needed for anxiety.    gabapentin (NEURONTIN) 300 MG capsule Take 300 mg by mouth 3 (three) times daily.    mometasone-formoterol (DULERA) 100-5 MCG/ACT AERO Inhale 2 puffs into the lungs 2 (two) times daily. For treatment of her chronic bronchitis. Qty:  1 Inhaler, Refills: 3    PARoxetine (PAXIL) 40 MG tablet Take 60 mg by mouth every morning.     rOPINIRole (REQUIP) 1 MG tablet Take 1 mg by mouth 2 (two) times daily.    aspirin EC 81 MG EC tablet Take 1 tablet (81 mg total) by mouth daily.       No Known Allergies    The results of significant diagnostics from this hospitalization (including imaging, microbiology, ancillary and laboratory) are listed below for reference.    Significant Diagnostic Studies: Dg Chest 2 View  06/05/2015   CLINICAL DATA:  Numbness and tingling in the left arm for 1 day. Patient fell yesterday and today. Generalized weakness.  EXAM: CHEST  2 VIEW  COMPARISON:  Chest x-rays dated 01/19/2015 and 01/15/2015  FINDINGS: The heart size and mediastinal contours are within normal limits. Both lungs are clear. The visualized skeletal structures are unremarkable.  IMPRESSION: No active cardiopulmonary disease.   Electronically Signed   By: Lorriane Shire M.D.   On: 06/05/2015 19:20   Ct Head Wo Contrast  06/05/2015   CLINICAL DATA:  69 year old male with numbness and tingling in the left arm since 1 p.m. yesterday afternoon. Weakness all over. Stroke 1 month ago with slurred speech.  EXAM: CT HEAD WITHOUT CONTRAST  TECHNIQUE: Contiguous axial images were obtained from the base of the skull through the vertex without intravenous contrast.  COMPARISON:  Head CT 01/19/2015.  FINDINGS: Well-defined focus of low attenuation in the left thalamus is compatible with an old lacunar infarct. Several other well-defined foci of low attenuation are noted in the basal ganglia bilaterally, compatible with old lacunar infarcts. Patchy and confluent areas of decreased attenuation are noted throughout the deep and periventricular white matter of the cerebral hemispheres bilaterally, compatible with chronic microvascular ischemic disease. Mild cerebral atrophy with associated ex vacuo dilatation of the ventricular system, similar to the prior  study. No definite signs of acute/subacute cerebral ischemia, no acute intracranial hemorrhage, no mass or mass effect and no definite hydrocephalus. No acute displaced skull fractures are identified. Visualized paranasal sinuses are generally well pneumatized. Right mastoid is well pneumatized. Large left mastoid effusion appears similar to the prior study.  IMPRESSION: 1. No acute intracranial abnormalities. 2. Multiple old lacunar infarcts in the basal ganglia and left thalamus. 3. Cerebral atrophy with ex vacuo dilatation of the ventricular system and mild chronic microvascular ischemic changes in cerebral white matter.   Electronically Signed   By: Vinnie Langton M.D.   On: 06/05/2015 19:01   Mr Virgel Paling Wo Contrast  06/06/2015   CLINICAL DATA:  69 year old male with history of Parkinson's disease and ventriculomegaly presenting with weakness and inability to walk for 3 days. Multiple falls.  EXAM: MRI HEAD WITHOUT CONTRAST  MRA HEAD WITHOUT CONTRAST  TECHNIQUE: Multiplanar, multiecho pulse sequences of the brain and surrounding structures were obtained without intravenous contrast. Angiographic images of the head were obtained using MRA technique without contrast.  COMPARISON:  06/05/2015 and 10/06/2009 head CT. 01/19/2015 brain MR and MR angiogram.  FINDINGS: MRI HEAD FINDINGS  Exam is motion degraded.  Acute nonhemorrhagic infarct lateral aspect left thalamus bordering the  posterior limb of the left internal capsule.  Question tiny subacute anterior right frontal infarct.  Remote bilateral thalamic infarcts.  Small vessel disease type changes.  No intracranial hemorrhage.  Atrophy with disproportionate enlargement of the lateral ventricles raises possibility of hydrocephalus (central atrophy is a secondary consideration). Aqueduct is patent. Appearance unchanged.  No intracranial mass lesion noted on this unenhanced exam.  Opacification left mastoid air cells without obstructing lesion identified.   Paranasal sinus mucosal thickening in a diffuse fashion  Cervical medullary junction, pituitary region and pineal region unremarkable. Post lens replacement.  MRA HEAD FINDINGS  Exam is motion degraded. Evaluating for the presence of stenosis or aneurysm is limited.  What can be stated with certainty on the present examination is that there is flow within the internal carotid arteries to level the carotid terminus bilaterally, flow was noted within the vertebral arteries and basilar artery with the right vertebral artery dominant and moderate to marked narrowing of the left vertebral artery suspected.  Medium and small vessel intracranial atherosclerotic type changes suspected but cannot be adequately assessed or confirmed.  Fetal type contribution to the posterior cerebral arteries.  IMPRESSION: MRI HEAD  Exam is motion degraded.  Acute nonhemorrhagic infarct lateral aspect left thalamus bordering the posterior limb of the left internal capsule.  Question tiny subacute anterior right frontal infarct.  Remote bilateral thalamic infarcts.  Small vessel disease type changes.  No intracranial hemorrhage.  Atrophy with disproportionate enlargement of the lateral ventricles raises possibility of hydrocephalus (central atrophy is a secondary consideration). Appearance unchanged.  Opacification left mastoid air cells.  Pan paranasal sinus mucosal thickening.  MRA HEAD  Exam is motion degraded. Evaluating for the presence of stenosis or aneurysm is limited.  What can be stated with certainty on the present examination is that there is flow within the internal carotid arteries to level the carotid terminus bilaterally, flow noted within the vertebral arteries and basilar artery with the right vertebral artery dominant and moderate to marked narrowing of the left vertebral artery suspected.  Medium and small vessel intracranial atherosclerotic type changes suspected but cannot be adequately assessed or confirmed.    Electronically Signed   By: Genia Del M.D.   On: 06/06/2015 10:19   Mr Brain Wo Contrast  06/06/2015   CLINICAL DATA:  69 year old male with history of Parkinson's disease and ventriculomegaly presenting with weakness and inability to walk for 3 days. Multiple falls.  EXAM: MRI HEAD WITHOUT CONTRAST  MRA HEAD WITHOUT CONTRAST  TECHNIQUE: Multiplanar, multiecho pulse sequences of the brain and surrounding structures were obtained without intravenous contrast. Angiographic images of the head were obtained using MRA technique without contrast.  COMPARISON:  06/05/2015 and 10/06/2009 head CT. 01/19/2015 brain MR and MR angiogram.  FINDINGS: MRI HEAD FINDINGS  Exam is motion degraded.  Acute nonhemorrhagic infarct lateral aspect left thalamus bordering the posterior limb of the left internal capsule.  Question tiny subacute anterior right frontal infarct.  Remote bilateral thalamic infarcts.  Small vessel disease type changes.  No intracranial hemorrhage.  Atrophy with disproportionate enlargement of the lateral ventricles raises possibility of hydrocephalus (central atrophy is a secondary consideration). Aqueduct is patent. Appearance unchanged.  No intracranial mass lesion noted on this unenhanced exam.  Opacification left mastoid air cells without obstructing lesion identified.  Paranasal sinus mucosal thickening in a diffuse fashion  Cervical medullary junction, pituitary region and pineal region unremarkable. Post lens replacement.  MRA HEAD FINDINGS  Exam is motion degraded. Evaluating for the presence  of stenosis or aneurysm is limited.  What can be stated with certainty on the present examination is that there is flow within the internal carotid arteries to level the carotid terminus bilaterally, flow was noted within the vertebral arteries and basilar artery with the right vertebral artery dominant and moderate to marked narrowing of the left vertebral artery suspected.  Medium and small vessel  intracranial atherosclerotic type changes suspected but cannot be adequately assessed or confirmed.  Fetal type contribution to the posterior cerebral arteries.  IMPRESSION: MRI HEAD  Exam is motion degraded.  Acute nonhemorrhagic infarct lateral aspect left thalamus bordering the posterior limb of the left internal capsule.  Question tiny subacute anterior right frontal infarct.  Remote bilateral thalamic infarcts.  Small vessel disease type changes.  No intracranial hemorrhage.  Atrophy with disproportionate enlargement of the lateral ventricles raises possibility of hydrocephalus (central atrophy is a secondary consideration). Appearance unchanged.  Opacification left mastoid air cells.  Pan paranasal sinus mucosal thickening.  MRA HEAD  Exam is motion degraded. Evaluating for the presence of stenosis or aneurysm is limited.  What can be stated with certainty on the present examination is that there is flow within the internal carotid arteries to level the carotid terminus bilaterally, flow noted within the vertebral arteries and basilar artery with the right vertebral artery dominant and moderate to marked narrowing of the left vertebral artery suspected.  Medium and small vessel intracranial atherosclerotic type changes suspected but cannot be adequately assessed or confirmed.   Electronically Signed   By: Genia Del M.D.   On: 06/06/2015 10:19     Labs: Basic Metabolic Panel:  Recent Labs Lab 06/05/15 1836 06/06/15 0459  NA 139 138  K 4.2 3.5  CL 107 109  CO2 27 24  GLUCOSE 107* 105*  BUN 11 11  CREATININE 0.95 1.00  CALCIUM 9.0 7.5*   Liver Function Tests:  Recent Labs Lab 06/05/15 1836 06/06/15 0459  AST 26 20  ALT 5* 7*  ALKPHOS 91 82  BILITOT 1.0 1.2  PROT 7.1 6.2*  ALBUMIN 4.0 3.4*  CBC:  Recent Labs Lab 06/05/15 1836 06/06/15 0459  WBC 10.4 8.4  NEUTROABS 7.4  --   HGB 16.2 14.4  HCT 46.4 42.4  MCV 92.4 93.4  PLT 238 217   BNP: BNP (last 3  results)  Recent Labs  01/15/15 2116  BNP 35.0     By signing my name below, I, Rhett Bannister attest that this documentation has been prepared under the direction and in the presence of Kathie Dike, MD  Electronically signed: Rhett Bannister  06/07/2015 9:43 AM   Signed:  Kathie Dike, M.D.  [facility] 06/07/2015, 9:50 AM   I, Dr. Kathie Dike, personally performed the services described in this documentaiton. All medical record entries made by the scribe were at my direction and in my presence. I have reviewed the chart and agree that the record reflects my personal performance and is accurate and complete  Kathie Dike, MD, 06/07/2015 8:08 PM

## 2015-06-07 NOTE — Care Management Important Message (Signed)
Important Message  Patient Details  Name: Lucas [name] MRN: [id number] Date of Birth: [birth year]   Medicare Important Message Given:  N/A - LOS <3 / Initial given by admissions    Sherald Barge, RN 06/07/2015, 2:41 PM

## 2015-06-07 NOTE — Progress Notes (Signed)
Pt IV removed, tolerated well.  Reviewed discharge instructions including Stroke Education with pt and family.  Answered all questions at this time.  Pt discharged home with family.

## 2015-06-07 NOTE — Evaluation (Signed)
Occupational Therapy Evaluation Patient Details Name: Lucas [name] MRN: [id number] DOB: 28-Oct-1945 Today's Date: 06/07/2015    History of Present Illness This 69 year old man has had several falls in the last 48 hours. He has never loss consciousness with any of them. He denies any chest pain, palpitations with any of them. He says that he felt weak in his legs and that's why he fell. He has a history of left midbrain/thalamus CVA previously. This man also has Parkinson's disease. He is now being admitted for further investigation of his falls.   Clinical Impression   Pt awake, alert, and sitting in chair this am. Pt reports he is feeling much better this am. Pt requires supervision during ADL tasks when sitting, will require additional assistance for functional mobility tasks due to balance deficits. Recommend SNF on discharge as pt has decreased caregiver support at home and is unsafe to go home alone. Recommend OT evaluation at SNF to further assess ADL tasks in functional setting. No further acute OT services required.     Follow Up Recommendations  SNF;Supervision/Assistance - 24 hour    Equipment Recommendations  None recommended by OT       Precautions / Restrictions Precautions Precautions: Fall Restrictions Weight Bearing Restrictions: No      Mobility Bed Mobility               General bed mobility comments: not tested-pt already up in chair  Transfers                 General transfer comment: not tested-pt already up in chair    Balance                                            ADL Overall ADL's : Needs assistance/impaired Eating/Feeding: Set up;Sitting   Grooming: Set up;Sitting               Lower Body Dressing: Supervision/safety;Sitting/lateral leans                 General ADL Comments: Pt is able to complete ADL tasks while sitting. Pt will require assistance for ADL tasks in standing due to balance  deficits and safety risks.      Vision Vision Assessment?: Yes Eye Alignment: Within Functional Limits Ocular Range of Motion: Within Functional Limits Alignment/Gaze Preference: Within Defined Limits Tracking/Visual Pursuits: Able to track stimulus in all quads without difficulty Saccades: Within functional limits Convergence: Within functional limits Visual Fields: No apparent deficits          Pertinent Vitals/Pain Pain Assessment: No/denies pain     Hand Dominance Right   Extremity/Trunk Assessment Upper Extremity Assessment Upper Extremity Assessment: Overall WFL for tasks assessed   Lower Extremity Assessment Lower Extremity Assessment: Defer to PT evaluation       Communication Communication Communication: Other (comment) (mildly slurred speech)   Cognition Arousal/Alertness: Awake/alert Behavior During Therapy: WFL for tasks assessed/performed Overall Cognitive Status: Within Functional Limits for tasks assessed                                Home Living Family/patient expects to be discharged to:: Skilled nursing facility Living Arrangements: Alone Available Help at Discharge: Family;Available PRN/intermittently Type of Home: Mobile home  Bathroom Shower/Tub: Teacher, early years/pre: Standard     Home Equipment: Environmental consultant - 2 wheels;Cane - quad;Grab bars - tub/shower;Wheelchair - manual          Prior Functioning/Environment Level of Independence: Independent with assistive device(s)                OT Problem List: Decreased safety awareness;Impaired balance (sitting and/or standing)    End of Session    Activity Tolerance: Patient tolerated treatment well Patient left: in chair;with call bell/phone within reach   Time: 0805-0826 OT Time Calculation (min): 21 min Charges:  OT General Charges $OT Visit: 1 Procedure OT Evaluation $Initial OT Evaluation Tier I: 1 Procedure Guadelupe Sabin, OTR/L   [phone]  06/07/2015, 10:02 AM

## 2015-06-21 ENCOUNTER — Emergency Department (HOSPITAL_COMMUNITY)
Admission: EM | Admit: 2015-06-21 | Discharge: 2015-06-21 | Disposition: A | Discharge status: Home / Self Care | Payer: Medicare Other | Attending: Emergency Medicine | Admitting: Emergency Medicine

## 2015-06-21 ENCOUNTER — Encounter (HOSPITAL_COMMUNITY): Payer: Self-pay

## 2015-06-21 DIAGNOSIS — G20 Parkinson's disease: Secondary | ICD-10-CM | POA: Insufficient documentation

## 2015-06-21 DIAGNOSIS — J449 Chronic obstructive pulmonary disease, unspecified: Secondary | ICD-10-CM | POA: Diagnosis not present

## 2015-06-21 DIAGNOSIS — Z79899 Other long term (current) drug therapy: Secondary | ICD-10-CM | POA: Insufficient documentation

## 2015-06-21 DIAGNOSIS — Z7951 Long term (current) use of inhaled steroids: Secondary | ICD-10-CM | POA: Insufficient documentation

## 2015-06-21 DIAGNOSIS — Z720 Tobacco use: Secondary | ICD-10-CM | POA: Diagnosis not present

## 2015-06-21 DIAGNOSIS — I10 Essential (primary) hypertension: Secondary | ICD-10-CM | POA: Diagnosis present

## 2015-06-21 DIAGNOSIS — I160 Hypertensive urgency: Secondary | ICD-10-CM | POA: Diagnosis not present

## 2015-06-21 DIAGNOSIS — Z7982 Long term (current) use of aspirin: Secondary | ICD-10-CM | POA: Insufficient documentation

## 2015-06-21 DIAGNOSIS — F418 Other specified anxiety disorders: Secondary | ICD-10-CM | POA: Diagnosis not present

## 2015-06-21 DIAGNOSIS — Z8673 Personal history of transient ischemic attack (TIA), and cerebral infarction without residual deficits: Secondary | ICD-10-CM | POA: Insufficient documentation

## 2015-06-21 LAB — I-STAT CHEM 8, ED
BUN: 16 mg/dL (ref 6–20)
CREATININE: 0.9 mg/dL (ref 0.61–1.24)
Calcium, Ion: 1.17 mmol/L (ref 1.13–1.30)
Chloride: 105 mmol/L (ref 101–111)
GLUCOSE: 112 mg/dL — AB (ref 65–99)
HEMATOCRIT: 46 % (ref 39.0–52.0)
HEMOGLOBIN: 15.6 g/dL (ref 13.0–17.0)
Potassium: 3.4 mmol/L — ABNORMAL LOW (ref 3.5–5.1)
Sodium: 143 mmol/L (ref 135–145)
TCO2: 26 mmol/L (ref 0–100)

## 2015-06-21 NOTE — Discharge Instructions (Signed)
As discussed, your evaluation today has been largely reassuring.  But, it is important that you monitor your condition carefully, and do not hesitate to return to the ED if you develop new, or concerning changes in your condition. ? ?Otherwise, please follow-up with your physician for appropriate ongoing care. ? ?

## 2015-06-21 NOTE — ED Provider Notes (Signed)
CSN: [id number]     Arrival date & time 06/21/15  1220 History   First MD Initiated Contact with Patient 06/21/15 1306     Chief Complaint  Patient presents with  . Hypertension     (Consider location/radiation/quality/duration/timing/severity/associated sxs/prior Treatment) HPI Patient presents at the request of his home health nurse due to concerns of hypertension. Patient self states that he feels"good"and his family member states that the patient is having a good day. He denies any chest pain, dyspnea, lightheadedness, 6B, nausea, vomiting. Today, home health nurse found the patient to be hypertensive, with a systolic greater than 532. Patient had taken his medication as morning, prior to the evaluation. No other recent medication changes, diet changes, activity changes.  Past Medical History  Diagnosis Date  . Parkinson's disease ([place])   . COPD (chronic obstructive pulmonary disease) ([place])   . Tobacco abuse   . Alcohol use ([employer])   . Depression with anxiety   . Cerebral ventriculomegaly 01/20/2015  . Acute ischemic stroke ([place]) 01/19/2015    Left midbrain/thalamus.   Past Surgical History  Procedure Laterality Date  . Eye surgery     No family history on file. Social History  Substance Use Topics  . Smoking status: Current Every Day Smoker -- 0.50 packs/day    Types: Cigarettes  . Smokeless tobacco: None  . Alcohol Use: Yes     Comment: beer occasionally    Review of Systems  Constitutional:       Per HPI, otherwise negative  HENT:       Per HPI, otherwise negative  Respiratory:       Per HPI, otherwise negative  Cardiovascular:       Per HPI, otherwise negative  Gastrointestinal: Negative for vomiting.  Endocrine:       Negative aside from HPI  Genitourinary:       Neg aside from HPI   Musculoskeletal:       Per HPI, otherwise negative  Skin: Negative.   Neurological: Negative for syncope.      Allergies  Review of patient's allergies indicates no  known allergies.  Home Medications   Prior to Admission medications   Medication Sig Start Date End Date Taking? Authorizing Provider  albuterol (PROVENTIL HFA;VENTOLIN HFA) 108 (90 BASE) MCG/ACT inhaler Inhale 2 puffs into the lungs every 4 (four) hours as needed for wheezing or shortness of breath. 01/21/15  Yes Rexene Alberts, MD  amLODipine (NORVASC) 2.5 MG tablet Take 1 tablet (2.5 mg total) by mouth daily. Medication to treat her high blood pressure. 01/21/15  Yes Rexene Alberts, MD  aspirin EC 81 MG EC tablet Take 1 tablet (81 mg total) by mouth daily. 01/16/15  Yes Erline Hau, MD  atorvastatin (LIPITOR) 10 MG tablet Take 10 mg by mouth daily. 04/27/15  Yes Historical Provider, MD  carbidopa-levodopa (SINEMET IR) 25-250 MG per tablet Take 1 tablet by mouth 3 (three) times daily.   Yes Historical Provider, MD  clonazePAM (KLONOPIN) 0.5 MG tablet Take 0.5 mg by mouth 2 (two) times daily as needed for anxiety.   Yes Historical Provider, MD  gabapentin (NEURONTIN) 300 MG capsule Take 300 mg by mouth 3 (three) times daily.   Yes Historical Provider, MD  mometasone-formoterol (DULERA) 100-5 MCG/ACT AERO Inhale 2 puffs into the lungs 2 (two) times daily. For treatment of her chronic bronchitis. 06/07/15  Yes Kathie Dike, MD  Multiple Vitamins-Minerals (MULTIVITAMIN WITH MINERALS) tablet Take 1 tablet by mouth daily.   Yes  Historical Provider, MD  PARoxetine (PAXIL) 40 MG tablet Take 60 mg by mouth every morning.    Yes Historical Provider, MD  rOPINIRole (REQUIP) 1 MG tablet Take 1 mg by mouth 2 (two) times daily.   Yes Historical Provider, MD   BP 125/87 mmHg  Pulse 68  Temp(Src) 97.5 F (36.4 C) (Oral)  Resp 16  Ht 5\' 8"  (1.727 m)  Wt 160 lb (72.576 kg)  BMI 24.33 kg/m2  SpO2 98% Physical Exam  Constitutional: He is oriented to person, place, and time. He appears well-developed. No distress.  HENT:  Head: Normocephalic and atraumatic.  Eyes: Conjunctivae and EOM are  normal.  Cardiovascular: Normal rate and regular rhythm.   Pulmonary/Chest: Effort normal. No stridor. No respiratory distress.  Abdominal: He exhibits no distension.  Musculoskeletal: He exhibits no edema.  Neurological: He is alert and oriented to person, place, and time.  Skin: Skin is warm and dry.  Psychiatric: He has a normal mood and affect.  Nursing note and vitals reviewed.   ED Course  Procedures (including critical care time) Labs Review Labs Reviewed  I-STAT CHEM 8, ED - Abnormal; Notable for the following:    Potassium 3.4 (*)    Glucose, Bld 112 (*)    All other components within normal limits    Imaging Review No results found. I have personally reviewed and evaluated these images and lab results as part of my medical decision-making.   EKG Interpretation None     On repeat exam the patient is in no distress. We discussed the need for primary care follow-up with his reassuring evaluation.  Filed Vitals:   06/21/15 1400  BP: 125/87  Pulse: 68  Temp:   Resp: 16    MDM  Patient presents with concern of hypertensive urgency. Here, no evidence for end organ effects, the patient is otherwise asymptomatic, and he was discharged in stable condition to follow-up with primary care.   Carmin Muskrat, MD 06/21/15 [phone]

## 2015-06-21 NOTE — ED Notes (Signed)
Pt reports home health RN told pt to come to er to have bp medication chaged or increased because bp high.  Reports bp was 180/88 and 209/80.  Pt denies any symptoms.

## 2016-01-26 ENCOUNTER — Emergency Department (HOSPITAL_COMMUNITY)
Admission: EM | Admit: 2016-01-26 | Discharge: 2016-01-26 | Disposition: A | Discharge status: Home / Self Care | Payer: Medicare Other | Attending: Emergency Medicine | Admitting: Emergency Medicine

## 2016-01-26 ENCOUNTER — Emergency Department (HOSPITAL_COMMUNITY): Payer: Medicare Other

## 2016-01-26 ENCOUNTER — Encounter (HOSPITAL_COMMUNITY): Payer: Self-pay

## 2016-01-26 DIAGNOSIS — M7989 Other specified soft tissue disorders: Secondary | ICD-10-CM | POA: Insufficient documentation

## 2016-01-26 DIAGNOSIS — R0981 Nasal congestion: Secondary | ICD-10-CM | POA: Diagnosis not present

## 2016-01-26 DIAGNOSIS — Z7982 Long term (current) use of aspirin: Secondary | ICD-10-CM | POA: Insufficient documentation

## 2016-01-26 DIAGNOSIS — F329 Major depressive disorder, single episode, unspecified: Secondary | ICD-10-CM | POA: Insufficient documentation

## 2016-01-26 DIAGNOSIS — J449 Chronic obstructive pulmonary disease, unspecified: Secondary | ICD-10-CM | POA: Insufficient documentation

## 2016-01-26 DIAGNOSIS — R200 Anesthesia of skin: Secondary | ICD-10-CM | POA: Insufficient documentation

## 2016-01-26 DIAGNOSIS — R531 Weakness: Secondary | ICD-10-CM | POA: Diagnosis present

## 2016-01-26 DIAGNOSIS — F17210 Nicotine dependence, cigarettes, uncomplicated: Secondary | ICD-10-CM | POA: Insufficient documentation

## 2016-01-26 DIAGNOSIS — R0602 Shortness of breath: Secondary | ICD-10-CM | POA: Diagnosis not present

## 2016-01-26 DIAGNOSIS — Z79899 Other long term (current) drug therapy: Secondary | ICD-10-CM | POA: Diagnosis not present

## 2016-01-26 DIAGNOSIS — G20 Parkinson's disease: Secondary | ICD-10-CM | POA: Insufficient documentation

## 2016-01-26 LAB — BRAIN NATRIURETIC PEPTIDE: B NATRIURETIC PEPTIDE 5: 35 pg/mL (ref 0.0–100.0)

## 2016-01-26 LAB — BASIC METABOLIC PANEL
ANION GAP: 4 — AB (ref 5–15)
BUN: 22 mg/dL — ABNORMAL HIGH (ref 6–20)
CALCIUM: 8.5 mg/dL — AB (ref 8.9–10.3)
CO2: 28 mmol/L (ref 22–32)
Chloride: 107 mmol/L (ref 101–111)
Creatinine, Ser: 1 mg/dL (ref 0.61–1.24)
GLUCOSE: 104 mg/dL — AB (ref 65–99)
POTASSIUM: 3.8 mmol/L (ref 3.5–5.1)
SODIUM: 139 mmol/L (ref 135–145)

## 2016-01-26 LAB — CBC WITH DIFFERENTIAL/PLATELET
Basophils Absolute: 0 K/uL (ref 0.0–0.1)
Basophils Relative: 0 %
Eosinophils Absolute: 0.2 K/uL (ref 0.0–0.7)
Eosinophils Relative: 2 %
HCT: 45.7 % (ref 39.0–52.0)
Hemoglobin: 15.2 g/dL (ref 13.0–17.0)
Lymphocytes Relative: 20 %
Lymphs Abs: 2 K/uL (ref 0.7–4.0)
MCH: 32.1 pg (ref 26.0–34.0)
MCHC: 33.3 g/dL (ref 30.0–36.0)
MCV: 96.4 fL (ref 78.0–100.0)
Monocytes Absolute: 0.8 K/uL (ref 0.1–1.0)
Monocytes Relative: 8 %
Neutro Abs: 7 K/uL (ref 1.7–7.7)
Neutrophils Relative %: 70 %
Platelets: 218 K/uL (ref 150–400)
RBC: 4.74 MIL/uL (ref 4.22–5.81)
RDW: 13.6 % (ref 11.5–15.5)
WBC: 10 K/uL (ref 4.0–10.5)

## 2016-01-26 LAB — I-STAT CG4 LACTIC ACID, ED: Lactic Acid, Venous: 1.11 mmol/L (ref 0.5–2.0)

## 2016-01-26 LAB — URINALYSIS, ROUTINE W REFLEX MICROSCOPIC
Bilirubin Urine: NEGATIVE
Glucose, UA: NEGATIVE mg/dL
Hgb urine dipstick: NEGATIVE
Ketones, ur: NEGATIVE mg/dL
Leukocytes, UA: NEGATIVE
Nitrite: NEGATIVE
Protein, ur: NEGATIVE mg/dL
Specific Gravity, Urine: 1.015 (ref 1.005–1.030)
pH: 6.5 (ref 5.0–8.0)

## 2016-01-26 LAB — HEPATIC FUNCTION PANEL
ALK PHOS: 97 U/L (ref 38–126)
ALT: 9 U/L — AB (ref 17–63)
AST: 25 U/L (ref 15–41)
Albumin: 3.6 g/dL (ref 3.5–5.0)
BILIRUBIN DIRECT: 0.1 mg/dL (ref 0.1–0.5)
BILIRUBIN INDIRECT: 0.9 mg/dL (ref 0.3–0.9)
Total Bilirubin: 1 mg/dL (ref 0.3–1.2)
Total Protein: 6.3 g/dL — ABNORMAL LOW (ref 6.5–8.1)

## 2016-01-26 LAB — CBG MONITORING, ED: Glucose-Capillary: 114 mg/dL — ABNORMAL HIGH (ref 65–99)

## 2016-01-26 LAB — TROPONIN I: Troponin I: <0.03 ng/mL

## 2016-01-26 NOTE — Discharge Instructions (Signed)

## 2016-01-26 NOTE — ED Notes (Signed)
EMS reports pt called because of SOB.  Pt also reports numbness in r leg x 1 week, numbness in left side of face since yesterday, and dizziness x 1 week but worse today.  EMS noted generalized weakness but weaker in left leg.  cbg 200.  Pt fell out of a chair yesterday.  EMS reports crackles in lungs on assessment.  Pt has strong odor of urine.

## 2016-01-26 NOTE — ED Provider Notes (Signed)
CSN: [id number]     Arrival date & time 01/26/16  1039 History  By signing my name below, I, Nicole Kindred, attest that this documentation has been prepared under the direction and in the presence of Noemi Chapel, MD.   Electronically Signed: Nicole Kindred, ED Scribe. 01/26/2016. 3:03 PM    Chief Complaint  Patient presents with  . Shortness of Breath  . Weakness   The history is provided by the patient. No language interpreter was used.   HPI Comments: Lucas [name] is a 70 y.o. male with PMHx of acute ischemic stroke, COPD, parkinson's disease, and cerebral ventriculomegaly who presents to the Emergency Department via EMS complaining of generalized weakness and right leg numbness, beginning one week ago. Pt also complains of bilateral leg swelling, and chest congestion. No other associated symptoms noted. No worsening or alleviating factors noted. Pt denies fever, chills, nausea, diarrhea, hematochezia, difficulty urinating, or any other pertinent symptoms.   Past Medical History  Diagnosis Date  . Parkinson's disease ([place])   . COPD (chronic obstructive pulmonary disease) ([place])   . Tobacco abuse   . Alcohol use ([place])   . Depression with anxiety   . Cerebral ventriculomegaly 01/20/2015  . Acute ischemic stroke ([place]) 01/19/2015    Left midbrain/thalamus.   Past Surgical History  Procedure Laterality Date  . Eye surgery     No family history on file. Social History  Substance Use Topics  . Smoking status: Current Every Day Smoker -- 0.50 packs/day    Types: Cigarettes  . Smokeless tobacco: Not on file  . Alcohol Use: No     Comment: former    Review of Systems  Constitutional: Negative for fever and chills.  HENT: Positive for congestion.   Cardiovascular: Positive for leg swelling.  Gastrointestinal: Negative for nausea, diarrhea and blood in stool.  Neurological: Positive for weakness and numbness.  All other systems reviewed and are negative.    Allergies   Review of patient's allergies indicates no known allergies.  Home Medications   Prior to Admission medications   Medication Sig Start Date End Date Taking? Authorizing Provider  amLODipine (NORVASC) 2.5 MG tablet Take 1 tablet (2.5 mg total) by mouth daily. Medication to treat her high blood pressure. 01/21/15  Yes Rexene Alberts, MD  aspirin EC 81 MG EC tablet Take 1 tablet (81 mg total) by mouth daily. 01/16/15  Yes Erline Hau, MD  atorvastatin (LIPITOR) 10 MG tablet Take 10 mg by mouth daily. 04/27/15  Yes Historical Provider, MD  carbidopa-levodopa (SINEMET IR) 25-250 MG per tablet Take 1 tablet by mouth 3 (three) times daily.   Yes Historical Provider, MD  clonazePAM (KLONOPIN) 0.5 MG tablet Take 0.5 mg by mouth 2 (two) times daily as needed for anxiety.   Yes Historical Provider, MD  furosemide (LASIX) 20 MG tablet Take 20 mg by mouth daily.   Yes Historical Provider, MD  gabapentin (NEURONTIN) 300 MG capsule Take 300 mg by mouth 3 (three) times daily.   Yes Historical Provider, MD  mometasone-formoterol (DULERA) 100-5 MCG/ACT AERO Inhale 2 puffs into the lungs 2 (two) times daily. For treatment of her chronic bronchitis. 06/07/15  Yes Kathie Dike, MD  Multiple Vitamins-Minerals (MULTIVITAMIN WITH MINERALS) tablet Take 1 tablet by mouth daily.   Yes Historical Provider, MD  PARoxetine (PAXIL) 40 MG tablet Take 60 mg by mouth every morning.    Yes Historical Provider, MD  rOPINIRole (REQUIP) 1 MG tablet Take 1 mg by mouth  2 (two) times daily.   Yes Historical Provider, MD   BP 120/74 mmHg  Pulse 68  Temp(Src) 97.9 F (36.6 C) (Rectal)  Resp 16  Ht 5\' 8"  (1.727 m)  Wt 178 lb (80.74 kg)  BMI 27.07 kg/m2  SpO2 99% Physical Exam  Constitutional: He appears well-developed and well-nourished. No distress.  HENT:  Head: Normocephalic and atraumatic.  Mouth/Throat: Oropharynx is clear and moist. No oropharyngeal exudate.  Abnormal shape of left pupil at baseline.  Eyes:  Conjunctivae and EOM are normal. Right eye exhibits no discharge. Left eye exhibits no discharge. No scleral icterus.  Neck: Normal range of motion. Neck supple. No JVD present. No thyromegaly present.  Cardiovascular: Normal rate, regular rhythm and intact distal pulses.  Exam reveals no gallop and no friction rub.   No murmur heard. Distant heart sounds.  Pulmonary/Chest: Tachypnea noted. No respiratory distress. He has no wheezes. He has rhonchi. He has no rales.  Diffuse rhonchi. Mild tachypnea.   Abdominal: Soft. Bowel sounds are normal. He exhibits no distension and no mass. There is no tenderness.  Genitourinary:  Chronic left hemiscrotum swelling. Non-tender, non-erythematous. Right scrotum normal.  Musculoskeletal: Normal range of motion. He exhibits edema. He exhibits no tenderness.  1+ pitting edema of feet bilaterally.  Lymphadenopathy:    He has no cervical adenopathy.  Neurological: He is alert. A sensory deficit is present. Coordination normal.  Upper extremity tremor at rest. Normal 5/5 strength in all four extremities. No obvious facial droop. Speaks in full sentences. Follows commands. Right anterior thigh numbness to light touch. All other sensation normal and intact.  Skin: Skin is warm and dry. No rash noted. No erythema.  Psychiatric: He has a normal mood and affect. His behavior is normal.  Nursing note and vitals reviewed.   ED Course  Procedures (including critical care time) DIAGNOSTIC STUDIES: Oxygen Saturation is 99% on Room Air, Normal by my interpretation.    COORDINATION OF CARE: 11:00 AM Discussed treatment plan with pt at bedside and pt agreed to plan.   Labs Review Labs Reviewed  BASIC METABOLIC PANEL - Abnormal; Notable for the following:    Glucose, Bld 104 (*)    BUN 22 (*)    Calcium 8.5 (*)    Anion gap 4 (*)    All other components within normal limits  HEPATIC FUNCTION PANEL - Abnormal; Notable for the following:    Total Protein 6.3 (*)     ALT 9 (*)    All other components within normal limits  CBG MONITORING, ED - Abnormal; Notable for the following:    Glucose-Capillary 114 (*)    All other components within normal limits  URINE CULTURE  CBC WITH DIFFERENTIAL/PLATELET  TROPONIN I  URINALYSIS, ROUTINE W REFLEX MICROSCOPIC (NOT AT [facility])  BRAIN NATRIURETIC PEPTIDE  I-STAT CG4 LACTIC ACID, ED  I-STAT CG4 LACTIC ACID, ED    Imaging Review Ct Head Wo Contrast  01/26/2016  CLINICAL DATA:  Numbness in the right leg began 1 week ago, history of Parkinsons and ischemic stroke EXAM: CT HEAD WITHOUT CONTRAST TECHNIQUE: Contiguous axial images were obtained from the base of the skull through the vertex without intravenous contrast. COMPARISON:  MR brain of 06/06/2015 10 CT brain of 06/05/2015 FINDINGS: The degree of ventricular dilatation appears relatively stable. The septum is midline in position. Mild cortical atrophy is noted. Small lacunar infarcts again are noted bilaterally within the basal ganglia and left thalamus. No hemorrhage, mass lesion, or acute infarction  is currently seen. There is evidence of bilateral maxillary sinusitis with some fluid -debris within the left partition of the sphenoid sinus as well. No calvarial abnormality is seen. IMPRESSION: 1. Stable ventricular dilatation, small lacunar infarcts, and atrophy. 2. Bilateral maxillary sinusitis. Electronically Signed   By: Ivar Drape M.D.   On: 01/26/2016 14:01   Dg Chest Portable 1 View  01/26/2016  CLINICAL DATA:  Patient with shortness of breath. EXAM: PORTABLE CHEST 1 VIEW COMPARISON:  Chest radiograph 06/05/2015. FINDINGS: Monitoring leads overlie the patient. Stable enlarged cardiac and mediastinal contours. No consolidative pulmonary opacities. No pleural effusion or pneumothorax. IMPRESSION: No acute cardiopulmonary process. Electronically Signed   By: Lovey Newcomer M.D.   On: 01/26/2016 11:42   I have personally reviewed and evaluated these images and lab  results as part of my medical decision-making.   EKG Interpretation   Date/Time:  Thursday January 26 2016 11:04:51 EDT Ventricular Rate:  69 PR Interval:  174 QRS Duration: 79 QT Interval:  373 QTC Calculation: 399 R Axis:   63 Text Interpretation:  Sinus rhythm Normal ECG Since last tracing rate  slower Confirmed by Amily Depp  MD, Avereigh Spainhower ([id number]) on 01/26/2016 11:28:16 AM      MDM   Final diagnoses:  Weakness    Well appearing on arrival Has VS which are normal Has neuro exam with focal numbness to R thigh but pt refuses to stay in hospital for MRi or for further w/u. He is aware of indications for return and states will pursue more testing with his MD - he states numbness almost gone - I have strongly recommended staying - he refuses.   I personally performed the services described in this documentation, which was scribed in my presence. The recorded information has been reviewed and is accurate.     Noemi Chapel, MD 01/26/16 [phone]

## 2016-01-27 LAB — URINE CULTURE

## 2016-05-08 ENCOUNTER — Other Ambulatory Visit (HOSPITAL_COMMUNITY): Payer: Self-pay | Admitting: Emergency Medicine

## 2016-05-08 DIAGNOSIS — M542 Cervicalgia: Secondary | ICD-10-CM

## 2016-05-14 ENCOUNTER — Emergency Department (HOSPITAL_COMMUNITY): Payer: Medicare Other

## 2016-05-14 ENCOUNTER — Emergency Department (HOSPITAL_COMMUNITY)
Admission: EM | Admit: 2016-05-14 | Discharge: 2016-05-14 | Disposition: A | Discharge status: Home / Self Care | Payer: Medicare Other | Attending: Emergency Medicine | Admitting: Emergency Medicine

## 2016-05-14 ENCOUNTER — Encounter (HOSPITAL_COMMUNITY): Payer: Self-pay

## 2016-05-14 DIAGNOSIS — M542 Cervicalgia: Secondary | ICD-10-CM | POA: Insufficient documentation

## 2016-05-14 DIAGNOSIS — Z79899 Other long term (current) drug therapy: Secondary | ICD-10-CM | POA: Diagnosis not present

## 2016-05-14 DIAGNOSIS — F17210 Nicotine dependence, cigarettes, uncomplicated: Secondary | ICD-10-CM | POA: Diagnosis not present

## 2016-05-14 DIAGNOSIS — G20 Parkinson's disease: Secondary | ICD-10-CM | POA: Diagnosis not present

## 2016-05-14 DIAGNOSIS — Z7982 Long term (current) use of aspirin: Secondary | ICD-10-CM | POA: Diagnosis not present

## 2016-05-14 DIAGNOSIS — R531 Weakness: Secondary | ICD-10-CM

## 2016-05-14 DIAGNOSIS — J449 Chronic obstructive pulmonary disease, unspecified: Secondary | ICD-10-CM | POA: Diagnosis not present

## 2016-05-14 DIAGNOSIS — I10 Essential (primary) hypertension: Secondary | ICD-10-CM | POA: Insufficient documentation

## 2016-05-14 DIAGNOSIS — R5383 Other fatigue: Secondary | ICD-10-CM | POA: Diagnosis present

## 2016-05-14 LAB — BASIC METABOLIC PANEL
Anion gap: 8 (ref 5–15)
BUN: 13 mg/dL (ref 6–20)
CHLORIDE: 108 mmol/L (ref 101–111)
CO2: 25 mmol/L (ref 22–32)
Calcium: 8.6 mg/dL — ABNORMAL LOW (ref 8.9–10.3)
Creatinine, Ser: 1.04 mg/dL (ref 0.61–1.24)
GFR calc Af Amer: >60 mL/min (ref >60)
GFR calc non Af Amer: >60 mL/min (ref >60)
Glucose, Bld: 109 mg/dL — ABNORMAL HIGH (ref 65–99)
POTASSIUM: 3.6 mmol/L (ref 3.5–5.1)
SODIUM: 141 mmol/L (ref 135–145)

## 2016-05-14 LAB — CBC WITH DIFFERENTIAL/PLATELET
BASOS ABS: 0.1 10*3/uL (ref 0.0–0.1)
BASOS PCT: 1 %
EOS PCT: 4 %
Eosinophils Absolute: 0.3 10*3/uL (ref 0.0–0.7)
HEMATOCRIT: 44 % (ref 39.0–52.0)
HEMOGLOBIN: 15.2 g/dL (ref 13.0–17.0)
LYMPHS PCT: 24 %
Lymphs Abs: 2.1 10*3/uL (ref 0.7–4.0)
MCH: 32.1 pg (ref 26.0–34.0)
MCHC: 34.5 g/dL (ref 30.0–36.0)
MCV: 92.8 fL (ref 78.0–100.0)
MONOS PCT: 8 %
Monocytes Absolute: 0.7 10*3/uL (ref 0.1–1.0)
NEUTROS ABS: 5.4 10*3/uL (ref 1.7–7.7)
Neutrophils Relative %: 63 %
Platelets: 223 10*3/uL (ref 150–400)
RBC: 4.74 MIL/uL (ref 4.22–5.81)
RDW: 13.3 % (ref 11.5–15.5)
WBC: 8.6 10*3/uL (ref 4.0–10.5)

## 2016-05-14 LAB — URINALYSIS, ROUTINE W REFLEX MICROSCOPIC
BILIRUBIN URINE: NEGATIVE
GLUCOSE, UA: NEGATIVE mg/dL
HGB URINE DIPSTICK: NEGATIVE
Ketones, ur: NEGATIVE mg/dL
Leukocytes, UA: NEGATIVE
Nitrite: NEGATIVE
PH: 7 (ref 5.0–8.0)
Protein, ur: NEGATIVE mg/dL
SPECIFIC GRAVITY, URINE: 1.01 (ref 1.005–1.030)

## 2016-05-14 MED ORDER — SODIUM CHLORIDE 0.9 % IV BOLUS (SEPSIS)
1000.0000 mL | Freq: Once | INTRAVENOUS | Status: AC
  Administered 2016-05-14: 1000 mL via INTRAVENOUS

## 2016-05-14 NOTE — ED Notes (Signed)
Pt taken to ct. nad 

## 2016-05-14 NOTE — Discharge Instructions (Signed)
Tests were good. X-ray of neck shows lots of arthritic changes.  Inrease fluids. Follow-up with your primary care doctor.

## 2016-05-14 NOTE — ED Triage Notes (Signed)
Pt complain of generalized weakness. States he also had an episode of being unable to swallow just before EMS was called. No difficulty at present.

## 2016-05-14 NOTE — ED Provider Notes (Signed)
[facility] DEPT Provider Note   CSN: [id number] Arrival date & time: 05/14/16  [id number]     History   Chief Complaint Chief Complaint  Patient presents with  . Fatigue    HPI Lucas [name] is a 70 y.o. male.  Generalized weakness since this morning. Questionable trouble swallowing earlier, but this improved. Patient and family state "back to normal". He feels slightly dehydrated. No gross neurological deficits, fever, chills, dysuria, chest pain, dyspnea.  Severity of symptoms is mild to moderate. Nothing makes symptoms better or worse.      Past Medical History:  Diagnosis Date  . Acute ischemic stroke ([place]) 01/19/2015   Left midbrain/thalamus.  . Alcohol use ([place])   . Cerebral ventriculomegaly 01/20/2015  . COPD (chronic obstructive pulmonary disease) ([place])   . Depression with anxiety   . Parkinson's disease ([place])   . Tobacco abuse     Patient Active Problem List   Diagnosis Date Noted  . Acute CVA (cerebrovascular accident) ([place]) 06/06/2015  . Essential hypertension 06/06/2015  . Hyperlipidemia 06/06/2015  . COPD (chronic obstructive pulmonary disease) ([place]) 06/06/2015  . Falls 06/06/2015  . Multiple falls 06/05/2015  . Cerebral ventriculomegaly 01/20/2015  . Facial droop 01/19/2015  . Tobacco abuse 01/19/2015  . Alcohol use ([place]) 01/19/2015  . Obesity 01/19/2015  . Depression with anxiety 01/19/2015  . Acute ischemic stroke ([place]) 01/19/2015  . Hyperglycemia 01/19/2015  . COPD exacerbation ([place])   . Dysarthria 01/15/2015  . Bronchospasm, acute 01/15/2015  . Abnormal CT scan of head 01/15/2015  . Parkinson's disease [facility])     Past Surgical History:  Procedure Laterality Date  . EYE SURGERY         Home Medications    Prior to Admission medications   Medication Sig Start Date End Date Taking? Authorizing Provider  amLODipine (NORVASC) 2.5 MG tablet Take 1 tablet (2.5 mg total) by mouth daily. Medication to treat her high blood pressure. 01/21/15   Yes Rexene Alberts, MD  atorvastatin (LIPITOR) 10 MG tablet Take 10 mg by mouth daily. 04/27/15  Yes Historical Provider, MD  citalopram (CELEXA) 40 MG tablet 1/2  tab in the morning X2 weeks then take one tab every morning 07/07/10  Yes Historical Provider, MD  clonazePAM (KLONOPIN) 1 MG tablet Take 1 mg by mouth 2 (two) times daily. 05/08/16  Yes Historical Provider, MD  furosemide (LASIX) 20 MG tablet Take 20 mg by mouth daily.   Yes Historical Provider, MD  gabapentin (NEURONTIN) 300 MG capsule Take 300 mg by mouth 3 (three) times daily.   Yes Historical Provider, MD  PARoxetine (PAXIL) 40 MG tablet Take 60 mg by mouth every morning.    Yes Historical Provider, MD  rOPINIRole (REQUIP) 1 MG tablet Take 1 mg by mouth 2 (two) times daily.   Yes Historical Provider, MD  aspirin EC 81 MG EC tablet Take 1 tablet (81 mg total) by mouth daily. 01/16/15   Erline Hau, MD  carbidopa-levodopa (SINEMET IR) 25-250 MG per tablet Take 1 tablet by mouth 3 (three) times daily.    Historical Provider, MD  mometasone-formoterol (DULERA) 100-5 MCG/ACT AERO Inhale 2 puffs into the lungs 2 (two) times daily. For treatment of her chronic bronchitis. 06/07/15   Kathie Dike, MD  Multiple Vitamins-Minerals (MULTIVITAMIN WITH MINERALS) tablet Take 1 tablet by mouth daily.    Historical Provider, MD    Family History No family history on file.  Social History Social History  Substance Use Topics  .  Smoking status: Current Every Day Smoker    Packs/day: 0.50    Types: Cigarettes  . Smokeless tobacco: Never Used  . Alcohol use No     Comment: former     Allergies   Review of patient's allergies indicates no known allergies.   Review of Systems Review of Systems  All other systems reviewed and are negative.    Physical Exam Updated Vital Signs BP 181/84   Pulse 72   Temp 98.6 F (37 C) (Rectal)   Resp 16   Ht 5\' 7"  (1.702 m)   Wt 190 lb (86.2 kg)   SpO2 95%   BMI 29.76 kg/m    Physical Exam  Constitutional: He is oriented to person, place, and time.  Alert, appears dehydrated  HENT:  Head: Normocephalic and atraumatic.  Eyes: Conjunctivae are normal.  Neck: Neck supple.  Cardiovascular: Normal rate and regular rhythm.   Pulmonary/Chest: Effort normal and breath sounds normal.  Abdominal: Soft. Bowel sounds are normal.  Musculoskeletal: Normal range of motion.  Neurological: He is alert and oriented to person, place, and time.  Skin: Skin is warm and dry.  Psychiatric: He has a normal mood and affect. His behavior is normal.  Nursing note and vitals reviewed.    ED Treatments / Results  Labs (all labs ordered are listed, but only abnormal results are displayed) Labs Reviewed  BASIC METABOLIC PANEL - Abnormal; Notable for the following:       Result Value   Glucose, Bld 109 (*)    Calcium 8.6 (*)    All other components within normal limits  CBC WITH DIFFERENTIAL/PLATELET  URINALYSIS, ROUTINE W REFLEX MICROSCOPIC (NOT AT [facility])    Date: 05/14/2016  Rate: 71  Rhythm: normal sinus rhythm  QRS Axis: normal  Intervals: normal  ST/T Wave abnormalities: normal  Conduction Disutrbances: none  Narrative Interpretation: unremarkable     Radiology Dg Chest 2 View  Result Date: 05/14/2016 CLINICAL DATA:  Difficulty swallowing, Parkinson's disease, weakness EXAM: CHEST  2 VIEW COMPARISON:  Chest x-ray of 01/26/2016 FINDINGS: No active infiltrate or effusion is seen. Mediastinal and hilar contours are unremarkable. The heart is within normal limits in size. There are degenerative changes within the mid to lower thoracic spine. IMPRESSION: No active cardiopulmonary disease. Electronically Signed   By: Ivar Drape M.D.   On: 05/14/2016 15:48   Ct Cervical Spine Wo Contrast  Result Date: 05/14/2016 CLINICAL DATA:  Right-sided neck pain EXAM: CT CERVICAL SPINE WITHOUT CONTRAST TECHNIQUE: Multidetector CT imaging of the cervical spine was performed without  intravenous contrast. Multiplanar CT image reconstructions were also generated. COMPARISON:  None. FINDINGS: Alignment: There is cervical levoscoliosis. There is no spondylolisthesis. Skull base and vertebrae: There is no demonstrable fracture. There are no blastic or lytic bone lesions. Soft tissues and spinal canal: The prevertebral soft tissues are normal. Predental space region is normal. No spinal stenosis evident. Disc levels: There is moderate disc space narrowing at C3-4, C4-5, and C5-6. At C2-3, there is no nerve root edema or effacement. No disc extrusion or stenosis. At C3-4, there is mild exit foraminal narrowing due to bony hypertrophy bilaterally. No nerve root edema or effacement is evident. Noted disc extrusion or stenosis. At C4-5, there is mild exit foraminal narrowing due to bony hypertrophy on the right without apparent nerve root edema or effacement. No disc extrusion or stenosis evident. At C5-6, there is moderate exit foraminal narrowing bilaterally due to bony hypertrophy. There is impression on the  exiting nerve root on the right at C5-6 due to bony hypertrophy. No disc extrusion or stenosis evident. At C6-7, there is broad-based disc bulging. No disc extrusion or stenosis evident. At C7-T1, there is mild diffuse disc bulging. No nerve root edema or effacement. No disc extrusion or stenosis. Upper chest:  Visualized lung apices are clear. Other: There is calcification in each carotid artery. There is nuchal ligament calcification posterior to C5-6. IMPRESSION: No fracture or spondylolisthesis. Mild scoliosis. Osteoarthritic changes several levels as summarized above. There are foci of carotid artery calcification bilaterally. No frank disc extrusion or evidence of canal stenosis. Electronically Signed   By: Lowella Grip III M.D.   On: 05/14/2016 17:43    Procedures Procedures (including critical care time)  Medications Ordered in ED Medications  sodium chloride 0.9 % bolus 1,000  mL (1,000 mLs Intravenous New Bag/Given 05/14/16 1701)     Initial Impression / Assessment and Plan / ED Course  I have reviewed the triage vital signs and the nursing notes.  Pertinent labs & imaging results that were available during my care of the patient were reviewed by me and considered in my medical decision making (see chart for details).  Clinical Course    Patient is in no acute distress. Labs were reassuring. CT cervical spine shows arthritic changes.  Final Clinical Impressions(s) / ED Diagnoses   Final diagnoses:  Weakness  Neck pain    New Prescriptions New Prescriptions   No medications on file     Nat Christen, MD 05/14/16 1816

## 2016-05-14 NOTE — ED Notes (Signed)
Pt returned from ct

## 2016-05-14 NOTE — ED Notes (Signed)
Pt taken to xray 

## 2016-05-25 ENCOUNTER — Ambulatory Visit (HOSPITAL_COMMUNITY): Payer: Medicare Other

## 2016-08-30 DIAGNOSIS — R69 Illness, unspecified: Secondary | ICD-10-CM | POA: Diagnosis not present

## 2016-08-30 DIAGNOSIS — I10 Essential (primary) hypertension: Secondary | ICD-10-CM | POA: Diagnosis not present

## 2016-08-30 DIAGNOSIS — I69398 Other sequelae of cerebral infarction: Secondary | ICD-10-CM | POA: Diagnosis not present

## 2016-08-30 DIAGNOSIS — Z7982 Long term (current) use of aspirin: Secondary | ICD-10-CM | POA: Diagnosis not present

## 2016-08-30 DIAGNOSIS — G20 Parkinson's disease: Secondary | ICD-10-CM | POA: Diagnosis not present

## 2016-08-30 DIAGNOSIS — R2689 Other abnormalities of gait and mobility: Secondary | ICD-10-CM | POA: Diagnosis not present

## 2016-10-07 DIAGNOSIS — G20 Parkinson's disease: Secondary | ICD-10-CM | POA: Diagnosis not present

## 2016-10-07 DIAGNOSIS — Z7982 Long term (current) use of aspirin: Secondary | ICD-10-CM | POA: Diagnosis not present

## 2016-10-07 DIAGNOSIS — R69 Illness, unspecified: Secondary | ICD-10-CM | POA: Diagnosis not present

## 2016-10-07 DIAGNOSIS — E785 Hyperlipidemia, unspecified: Secondary | ICD-10-CM | POA: Diagnosis not present

## 2016-10-07 DIAGNOSIS — E559 Vitamin D deficiency, unspecified: Secondary | ICD-10-CM | POA: Diagnosis not present

## 2016-10-07 DIAGNOSIS — Z720 Tobacco use: Secondary | ICD-10-CM | POA: Diagnosis not present

## 2016-10-07 DIAGNOSIS — I10 Essential (primary) hypertension: Secondary | ICD-10-CM | POA: Diagnosis not present

## 2016-10-29 DIAGNOSIS — R2689 Other abnormalities of gait and mobility: Secondary | ICD-10-CM | POA: Diagnosis not present

## 2016-10-29 DIAGNOSIS — R69 Illness, unspecified: Secondary | ICD-10-CM | POA: Diagnosis not present

## 2016-10-29 DIAGNOSIS — G20 Parkinson's disease: Secondary | ICD-10-CM | POA: Diagnosis not present

## 2016-10-29 DIAGNOSIS — R251 Tremor, unspecified: Secondary | ICD-10-CM | POA: Diagnosis not present

## 2016-12-04 DIAGNOSIS — G20 Parkinson's disease: Secondary | ICD-10-CM | POA: Diagnosis not present

## 2016-12-04 DIAGNOSIS — Z01818 Encounter for other preprocedural examination: Secondary | ICD-10-CM | POA: Diagnosis not present

## 2016-12-17 DIAGNOSIS — Z0000 Encounter for general adult medical examination without abnormal findings: Secondary | ICD-10-CM | POA: Diagnosis not present

## 2016-12-24 DIAGNOSIS — Z7409 Other reduced mobility: Secondary | ICD-10-CM | POA: Diagnosis not present

## 2016-12-24 DIAGNOSIS — I10 Essential (primary) hypertension: Secondary | ICD-10-CM | POA: Diagnosis not present

## 2016-12-24 DIAGNOSIS — Z8673 Personal history of transient ischemic attack (TIA), and cerebral infarction without residual deficits: Secondary | ICD-10-CM | POA: Diagnosis not present

## 2017-01-25 ENCOUNTER — Encounter (INDEPENDENT_AMBULATORY_CARE_PROVIDER_SITE_OTHER): Payer: Self-pay

## 2017-01-25 ENCOUNTER — Telehealth: Payer: Self-pay | Admitting: Family Medicine

## 2017-01-25 ENCOUNTER — Ambulatory Visit (INDEPENDENT_AMBULATORY_CARE_PROVIDER_SITE_OTHER): Payer: Medicare HMO | Admitting: Family Medicine

## 2017-01-25 ENCOUNTER — Encounter: Payer: Self-pay | Admitting: Family Medicine

## 2017-01-25 VITALS — BP 178/84 | HR 60 | Temp 98.7°F | Resp 20 | Ht 67.0 in | Wt 200.0 lb

## 2017-01-25 DIAGNOSIS — Z1211 Encounter for screening for malignant neoplasm of colon: Secondary | ICD-10-CM

## 2017-01-25 DIAGNOSIS — G20 Parkinson's disease: Secondary | ICD-10-CM

## 2017-01-25 DIAGNOSIS — N5089 Other specified disorders of the male genital organs: Secondary | ICD-10-CM

## 2017-01-25 DIAGNOSIS — R69 Illness, unspecified: Secondary | ICD-10-CM | POA: Diagnosis not present

## 2017-01-25 DIAGNOSIS — Z720 Tobacco use: Secondary | ICD-10-CM

## 2017-01-25 DIAGNOSIS — I10 Essential (primary) hypertension: Secondary | ICD-10-CM

## 2017-01-25 DIAGNOSIS — J449 Chronic obstructive pulmonary disease, unspecified: Secondary | ICD-10-CM | POA: Diagnosis not present

## 2017-01-25 DIAGNOSIS — E785 Hyperlipidemia, unspecified: Secondary | ICD-10-CM | POA: Diagnosis not present

## 2017-01-25 DIAGNOSIS — I639 Cerebral infarction, unspecified: Secondary | ICD-10-CM | POA: Diagnosis not present

## 2017-01-25 DIAGNOSIS — F418 Other specified anxiety disorders: Secondary | ICD-10-CM

## 2017-01-25 MED ORDER — LISINOPRIL 5 MG PO TABS: 5.0000 mg | ORAL_TABLET | Freq: Every day | ORAL | 3 refills | Status: DC

## 2017-01-25 NOTE — Telephone Encounter (Signed)
Patient called: Please add to Rx list  ventolin 90 mcg inhaler (said he forgot to bring with him to his appt)

## 2017-01-25 NOTE — Patient Instructions (Signed)
Try to stop smoking Cur the clonazepam in half See me in one month Stop the amlodipine take lisinopril daily May stop the lasix if the swelling goes away  See me in one month

## 2017-01-25 NOTE — Progress Notes (Signed)
Chief Complaint  Patient presents with  . Cerebrovascular Accident   Complicated patient with multiple medical problems and no records Brought in by sister Fair historian.  Does not know why he is on some of his medicines Sees a neurologist in [place] for parkinsons/stroke Has an eye doctor, needs visit Has a urologist he sees for periodic swelling testicles - he "draws the fluid off" ?hydrocoele Is largely wheelchair bound, lives alone, has an aide, on a "good day" he can use a walker Denies depression or memory loss.  Has anxiety and uses clonazepam PRN then sleeps Here with sister who checks on him every morning and evening.  She complains of excessive sleepiness He was pun on amlodipine for BP Then Lasix for edema I will change is BP medicine to lisinopril and watch the fluid He says he had lung cancer screening with a CAT scan He has never had a colonoscopy because is not mobile enough to tolerate the prep/diarrhea.  Agrees to cologuard Says his shots are up to date Unknown AAA screen I have discussed the multiple health risks associated with cigarette smoking including, but not limited to, cardiovascular disease, lung disease and cancer.  I have strongly recommended that smoking be stopped.  I have reviewed the various methods of quitting including cold [place], classes, nicotine replacements and prescription medications.  I have offered assistance in this difficult process.  The patient is not interested in assistance at this time. He agrees to come back in one month and if he is not successful in quitting on his own, will try chantix   Patient Active Problem List   Diagnosis Date Noted  . Stroke ([place]) 01/25/2017  . Essential hypertension 06/06/2015  . Hyperlipidemia 06/06/2015  . COPD (chronic obstructive pulmonary disease) ([place]) 06/06/2015  . Multiple falls 06/05/2015  . Cerebral ventriculomegaly 01/20/2015  . Facial droop 01/19/2015  . Tobacco abuse 01/19/2015  .  Obesity 01/19/2015  . Depression with anxiety 01/19/2015  . Hyperglycemia 01/19/2015  . Dysarthria 01/15/2015  . Parkinson's disease [facility])     Outpatient Encounter Prescriptions as of 01/25/2017  Medication Sig  . aspirin EC 81 MG EC tablet Take 1 tablet (81 mg total) by mouth daily.  Marland Kitchen atorvastatin (LIPITOR) 10 MG tablet Take 10 mg by mouth daily.  . Carbidopa-Levodopa ER (RYTARY) 36.25-145 MG CPCR Take by mouth.  . clonazePAM (KLONOPIN) 1 MG tablet Take 1 mg by mouth 2 (two) times daily.  . furosemide (LASIX) 20 MG tablet Take 20 mg by mouth daily.  Marland Kitchen gabapentin (NEURONTIN) 300 MG capsule Take 300 mg by mouth 3 (three) times daily.  . Multiple Vitamins-Minerals (MULTIVITAMIN WITH MINERALS) tablet Take 1 tablet by mouth daily.  Marland Kitchen PARoxetine (PAXIL) 40 MG tablet Take 60 mg by mouth every morning.   Marland Kitchen rOPINIRole (REQUIP) 1 MG tablet Take 1 mg by mouth 2 (two) times daily.  Marland Kitchen lisinopril (PRINIVIL,ZESTRIL) 5 MG tablet Take 1 tablet (5 mg total) by mouth daily.   No facility-administered encounter medications on file as of 01/25/2017.     Past Medical History:  Diagnosis Date  . Acute CVA (cerebrovascular accident) ([place]) 06/06/2015  . Acute ischemic stroke ([place]) 01/19/2015   Left midbrain/thalamus.  . Alcohol use   . Anxiety   . Cataract   . Cerebral ventriculomegaly 01/20/2015  . COPD (chronic obstructive pulmonary disease) ([place])   . Depression   . Depression with anxiety   . Hyperlipidemia   . Hypertension   . Neuromuscular disorder ([place])   .  Parkinson's disease ([place])   . Substance abuse   . Tobacco abuse     Past Surgical History:  Procedure Laterality Date  . EYE SURGERY    . SHOULDER SURGERY      Social History   Social History  . Marital status: Divorced    Spouse name: N/A  . Number of children: 3  . Years of education: 10   Occupational History  . disabled/retired     Games developer   Social History Main Topics  . Smoking status: Current Every Day Smoker     Packs/day: 1.00    Types: Cigarettes  . Smokeless tobacco: Never Used  . Alcohol use No     Comment: former  . Drug use: No  . Sexual activity: No   Other Topics Concern  . Not on file   Social History Narrative   Disabled from [place] and stroke   Previous  Games developer   Lives alone   Has an aid   Family helps    Family History  Problem Relation Age of Onset  . Cancer Father        lung  . Cancer Brother        lung  . Early death Brother        suicide  . Early death Brother        mva  . COPD Sister   . Diabetes Sister   . Heart disease Sister   . Early death Sister        fire    Review of Systems  Constitutional: Positive for malaise/fatigue. Negative for weight loss.  HENT: Positive for hearing loss. Negative for congestion and sinus pain.   Eyes: Negative for discharge and redness.  Respiratory: Negative for cough, sputum production and shortness of breath.   Cardiovascular: Positive for leg swelling. Negative for chest pain and palpitations.  Gastrointestinal: Negative for abdominal pain, blood in stool, constipation, diarrhea and heartburn.  Genitourinary: Negative for dysuria and frequency.  Musculoskeletal: Positive for falls. Negative for back pain, joint pain and neck pain.  Skin: Negative for itching and rash.  Neurological: Positive for tremors, speech change, focal weakness and weakness. Negative for dizziness and headaches.  Endo/Heme/Allergies: Negative for environmental allergies.  Psychiatric/Behavioral: Negative for depression. The patient is not nervous/anxious and does not have insomnia.    BP (!) 178/84 (BP Location: Right Arm, Patient Position: Sitting, Cuff Size: Normal)   Pulse 60   Temp 98.7 F (37.1 C) (Temporal)   Resp 20   Ht 5\' 7"  (1.702 m)   Wt 200 lb 0.6 oz (90.7 kg)   SpO2 95%   BMI 31.33 kg/m   Physical Exam  Constitutional: He appears well-developed and well-nourished.  Chronically ill appearing.  Face droop.  Slight  dysarthria.  Hard of hearing.  wheelchair  HENT:  Head: Normocephalic and atraumatic.  Mouth/Throat: Oropharynx is clear and moist.  Poor dentition  Eyes: Conjunctivae are normal. Pupils are equal, round, and reactive to light.  Neck: Normal range of motion. No thyromegaly present.  Cardiovascular: Normal rate, regular rhythm and normal heart sounds.   Pulmonary/Chest: Effort normal and breath sounds normal. No respiratory distress.  Scattered rhonchi  Abdominal: Soft. Bowel sounds are normal.  Musculoskeletal: He exhibits edema.  Diffuse weakness.  Atrophy muscles.  Debilitated.  Lymphadenopathy:    He has no cervical adenopathy.  Neurological: He is alert.  Continuous tremor UE R>L.  Cogwheeling  Skin: Skin is warm and dry.  Psychiatric:  Preserved facial expression.  Poor insight and fund of [place] note and vitals reviewed. ASSESSMENT/PLAN:   1. Essential hypertension Not controlled.  Edema on amlodipine  2. Chronic obstructive pulmonary disease, unspecified COPD type ([place]) smoker  3. Parkinson's disease ([place]) Under care [facility]  4. Depression with anxiety On Paxil.  Agrees to reduce clonopin to 0.5 mg per dose to reduce sedation  5. Hyperlipidemia, unspecified hyperlipidemia type On statin  6. Swollen testicle By HX - Ambulatory referral to Urology  7. Screen for colon cancer - Cologuard  8. Cerebrovascular accident (CVA), unspecified mechanism ([place])   9. Tobacco abuse Discussed cessation, methods, risks of recurrent stroke, worsening COPD, heat attack and various cancers.  He agrees to quit " on his own" or try chantix if unable  Greater than 50% of this visit was spent in counseling and coordinating care.  Total face to face time:   60 min Patient Instructions  Try to stop smoking Cur the clonazepam in half See me in one month Stop the amlodipine take lisinopril daily May stop the lasix if the swelling goes away  See me in one  month    Raylene Everts, MD

## 2017-01-28 NOTE — Telephone Encounter (Signed)
Done

## 2017-01-29 DIAGNOSIS — R69 Illness, unspecified: Secondary | ICD-10-CM | POA: Diagnosis not present

## 2017-01-29 DIAGNOSIS — G20 Parkinson's disease: Secondary | ICD-10-CM | POA: Diagnosis not present

## 2017-01-29 DIAGNOSIS — I679 Cerebrovascular disease, unspecified: Secondary | ICD-10-CM | POA: Diagnosis not present

## 2017-02-28 ENCOUNTER — Ambulatory Visit (INDEPENDENT_AMBULATORY_CARE_PROVIDER_SITE_OTHER): Payer: Medicare HMO | Admitting: Family Medicine

## 2017-02-28 ENCOUNTER — Encounter: Payer: Self-pay | Admitting: Family Medicine

## 2017-02-28 VITALS — BP 120/74 | HR 80 | Temp 98.6°F | Resp 18 | Ht 67.0 in | Wt 199.1 lb

## 2017-02-28 DIAGNOSIS — J449 Chronic obstructive pulmonary disease, unspecified: Secondary | ICD-10-CM

## 2017-02-28 DIAGNOSIS — R296 Repeated falls: Secondary | ICD-10-CM

## 2017-02-28 DIAGNOSIS — Z720 Tobacco use: Secondary | ICD-10-CM | POA: Diagnosis not present

## 2017-02-28 DIAGNOSIS — G20 Parkinson's disease: Secondary | ICD-10-CM

## 2017-02-28 DIAGNOSIS — I10 Essential (primary) hypertension: Secondary | ICD-10-CM | POA: Diagnosis not present

## 2017-02-28 DIAGNOSIS — N5089 Other specified disorders of the male genital organs: Secondary | ICD-10-CM

## 2017-02-28 NOTE — Patient Instructions (Addendum)
You have an appointment with Dr Earlyne Iba at his [place] office on July 17th at 2:00 Call if you need to re schedule this [phone]  Take Lasix as needed for edema/swelling Continue to limit salt in diet EXERCISE EVERY DAY  I will call your neurologist about Chantix.  We will call you with his answer and your prescription information.  See me in 3 months Routine lab tests ordered

## 2017-02-28 NOTE — Progress Notes (Signed)
Chief Complaint  Patient presents with  . Follow-up    1 month  Patient is here for return visit. His blood pressure is doing very well on the new medication. He stopped his amlodipine and started lisinopril. He stopped his Lasix. He still has a mild to moderate amount of edema, but it is not uncomfortable for him. He is reminded to try to reduce his salt. Since I saw him last and very little walking. He is reminded of the importance of daily exercise. If he allows himself to remain a chair all of the time he'll progressively become weaker. His health will fail. He does have AIDS that will help him with exercise if requested. His appetite is good. He still sleeps a lot. He is on a new medicine for his Parkinson's. He has not seen any improvement in his tremor. We discussed a urologist at his last visit. He has not yet made the appointment. I did call his urologist for him and got an appointment for July 17. He is here with his sister who make sure he gets that appointment. No problems with breathing or COPD. He has reduced his smoking, he has changed to a light cigarette. He would like to try Chantix. I told him that I will do this after I placed a phone call to his neurologist to make sure there are no safety concerns.   Patient Active Problem List   Diagnosis Date Noted  . Stroke ([place]) 01/25/2017  . Essential hypertension 06/06/2015  . Hyperlipidemia 06/06/2015  . COPD (chronic obstructive pulmonary disease) ([place]) 06/06/2015  . Multiple falls 06/05/2015  . Cerebral ventriculomegaly 01/20/2015  . Facial droop 01/19/2015  . Tobacco abuse 01/19/2015  . Obesity 01/19/2015  . Depression with anxiety 01/19/2015  . Hyperglycemia 01/19/2015  . Dysarthria 01/15/2015  . Parkinson's disease [facility])     Outpatient Encounter Prescriptions as of 02/28/2017  Medication Sig  . albuterol (PROVENTIL HFA;VENTOLIN HFA) 108 (90 Base) MCG/ACT inhaler Inhale 1 puff into the lungs every 4 (four) hours as  needed for wheezing or shortness of breath.  Marland Kitchen aspirin EC 81 MG EC tablet Take 1 tablet (81 mg total) by mouth daily.  Marland Kitchen atorvastatin (LIPITOR) 10 MG tablet Take 10 mg by mouth daily.  . Carbidopa-Levodopa ER (RYTARY) 36.25-145 MG CPCR Take by mouth.  . clonazePAM (KLONOPIN) 1 MG tablet Take 1 mg by mouth 2 (two) times daily.  . furosemide (LASIX) 20 MG tablet Take 20 mg by mouth daily.  Marland Kitchen gabapentin (NEURONTIN) 300 MG capsule Take 300 mg by mouth 3 (three) times daily.  Marland Kitchen lisinopril (PRINIVIL,ZESTRIL) 5 MG tablet Take 1 tablet (5 mg total) by mouth daily.  . Multiple Vitamins-Minerals (MULTIVITAMIN WITH MINERALS) tablet Take 1 tablet by mouth daily.  Marland Kitchen PARoxetine (PAXIL) 40 MG tablet Take 60 mg by mouth every morning.   Marland Kitchen rOPINIRole (REQUIP) 1 MG tablet Take 1 mg by mouth 2 (two) times daily.   No facility-administered encounter medications on file as of 02/28/2017.     No Known Allergies  Review of Systems  Constitutional: Positive for activity change and fatigue. Negative for appetite change and unexpected weight change.  HENT: Negative for congestion and dental problem.   Eyes: Negative for photophobia and visual disturbance.  Respiratory: Negative for cough and shortness of breath.   Cardiovascular: Positive for leg swelling. Negative for chest pain and palpitations.  Gastrointestinal: Negative for constipation and diarrhea.  Genitourinary: Positive for scrotal swelling. Negative for difficulty urinating.  Musculoskeletal: Positive for gait problem. Negative for arthralgias and back pain.  Skin: Negative for rash.  Neurological: Positive for tremors and weakness.  Psychiatric/Behavioral: Positive for dysphoric mood. The patient is nervous/anxious.     BP 120/74 (BP Location: Left Arm, Patient Position: Sitting, Cuff Size: Normal)   Pulse 80   Temp 98.6 F (37 C) (Temporal)   Resp 18   Ht 5\' 7"  (1.702 m)   Wt 199 lb 1.3 oz (90.3 kg)   SpO2 96%   BMI 31.18 kg/m   Physical  Exam  Constitutional: He is oriented to person, place, and time. He appears well-developed and well-nourished.  Wheelchair.   pleasant  HENT:  Head: Normocephalic and atraumatic.  Mouth/Throat: Oropharynx is clear and moist.  Eyes: Conjunctivae are normal. Pupils are equal, round, and reactive to light.  Neck: Normal range of motion. No thyromegaly present.  Cardiovascular: Normal rate, regular rhythm and normal heart sounds.   Pulmonary/Chest: Effort normal and breath sounds normal. He has no wheezes.  Abdominal: Soft. Bowel sounds are normal. There is no tenderness.  Musculoskeletal:  General weakness.  Cogwheels.  Tremor R hand.  Neurological: He is alert and oriented to person, place, and time.  Psychiatric: He has a normal mood and affect. His behavior is normal.    ASSESSMENT/PLAN:  1. Swollen testicle - Ambulatory referral to Urology  2. Chronic obstructive pulmonary disease, unspecified COPD type (H[facility])  - CBC - COMPLETE METABOLIC PANEL WITH GFR - Lipid panel - VITAMIN D 25 Hydroxy (Vit-D Deficiency, Fractures) - Urinalysis, Routine w reflex microscopic  3. Parkinson's disease ([place])  4. Essential hypertension  5. Tobacco abuse  6. Multiple falls    Patient Instructions  You have an appointment with Dr Earlyne Iba at his [place] office on July 17th at 2:00 Call if you need to re schedule this [phone]  Take Lasix as needed for edema/swelling Continue to limit salt in diet EXERCISE EVERY DAY  I will call your neurologist about Chantix.  We will call you with his answer and your prescription information.  See me in 3 months Routine lab tests ordered   Raylene Everts, MD

## 2017-03-01 ENCOUNTER — Telehealth: Payer: Self-pay

## 2017-03-01 NOTE — Telephone Encounter (Signed)
-----   Message from Raylene Everts, MD sent at 02/28/2017 12:48 PM EDT ----- Dr Nicholas Lose Neurology [phone] Please call and have nurse ask doctor if it is safe for [facility] to try chantix for his tobacco addiction??

## 2017-03-01 NOTE — Telephone Encounter (Signed)
Have called. Results for orders placed or performed during the hospital encounter of 01/26/16  CT Head Wo Contrast   Narrative   CLINICAL DATA:  Numbness in the right leg began 1 week ago, history of Parkinsons and ischemic stroke  EXAM: CT HEAD WITHOUT CONTRAST  TECHNIQUE: Contiguous axial images were obtained from the base of the skull through the vertex without intravenous contrast.  COMPARISON:  MR brain of 06/06/2015 10 CT brain of 06/05/2015  FINDINGS: The degree of ventricular dilatation appears relatively stable. The septum is midline in position. Mild cortical atrophy is noted. Small lacunar infarcts again are noted bilaterally within the basal ganglia and left thalamus. No hemorrhage, mass lesion, or acute infarction is currently seen. There is evidence of bilateral maxillary sinusitis with some fluid -debris within the left partition of the sphenoid sinus as well. No calvarial abnormality is seen.  IMPRESSION: 1. Stable ventricular dilatation, small lacunar infarcts, and atrophy. 2. Bilateral maxillary sinusitis.   Electronically Signed   By: Ivar Drape M.D.   On: 01/26/2016 14:01   Results for orders placed or performed during the hospital encounter of 06/05/15  MR Brain Wo Contrast   Narrative   CLINICAL DATA:  71 year old male with history of Parkinson's disease and ventriculomegaly presenting with weakness and inability to walk for 3 days. Multiple falls.  EXAM: MRI HEAD WITHOUT CONTRAST  MRA HEAD WITHOUT CONTRAST  TECHNIQUE: Multiplanar, multiecho pulse sequences of the brain and surrounding structures were obtained without intravenous contrast. Angiographic images of the head were obtained using MRA technique without contrast.  COMPARISON:  06/05/2015 and 10/06/2009 head CT. 01/19/2015 brain MR and MR angiogram.  FINDINGS: MRI HEAD FINDINGS  Exam is motion degraded.  Acute nonhemorrhagic infarct lateral aspect left thalamus  bordering the posterior limb of the left internal capsule.  Question tiny subacute anterior right frontal infarct.  Remote bilateral thalamic infarcts.  Small vessel disease type changes.  No intracranial hemorrhage.  Atrophy with disproportionate enlargement of the lateral ventricles raises possibility of hydrocephalus (central atrophy is a secondary consideration). Aqueduct is patent. Appearance unchanged.  No intracranial mass lesion noted on this unenhanced exam.  Opacification left mastoid air cells without obstructing lesion identified.  Paranasal sinus mucosal thickening in a diffuse fashion  Cervical medullary junction, pituitary region and pineal region unremarkable. Post lens replacement.  MRA HEAD FINDINGS  Exam is motion degraded. Evaluating for the presence of stenosis or aneurysm is limited.  What can be stated with certainty on the present examination is that there is flow within the internal carotid arteries to level the carotid terminus bilaterally, flow was noted within the vertebral arteries and basilar artery with the right vertebral artery dominant and moderate to marked narrowing of the left vertebral artery suspected.  Medium and small vessel intracranial atherosclerotic type changes suspected but cannot be adequately assessed or confirmed.  Fetal type contribution to the posterior cerebral arteries.  IMPRESSION: MRI HEAD  Exam is motion degraded.  Acute nonhemorrhagic infarct lateral aspect left thalamus bordering the posterior limb of the left internal capsule.  Question tiny subacute anterior right frontal infarct.  Remote bilateral thalamic infarcts.  Small vessel disease type changes.  No intracranial hemorrhage.  Atrophy with disproportionate enlargement of the lateral ventricles raises possibility of hydrocephalus (central atrophy is a secondary consideration). Appearance unchanged.  Opacification left mastoid air  cells.  Pan paranasal sinus mucosal thickening.  MRA HEAD  Exam is motion degraded. Evaluating for the presence of stenosis or aneurysm is limited.  What can be stated with certainty on the present examination is that there is flow within the internal carotid arteries to level the carotid terminus bilaterally, flow noted within the vertebral arteries and basilar artery with the right vertebral artery dominant and moderate to marked narrowing of the left vertebral artery suspected.  Medium and small vessel intracranial atherosclerotic type changes suspected but cannot be adequately assessed or confirmed.   Electronically Signed   By: Genia Del M.D.   On: 06/06/2015 10:19   MR MRA HEAD WO CONTRAST   Narrative   CLINICAL DATA:  71 year old male with history of Parkinson's disease and ventriculomegaly presenting with weakness and inability to walk for 3 days. Multiple falls.  EXAM: MRI HEAD WITHOUT CONTRAST  MRA HEAD WITHOUT CONTRAST  TECHNIQUE: Multiplanar, multiecho pulse sequences of the brain and surrounding structures were obtained without intravenous contrast. Angiographic images of the head were obtained using MRA technique without contrast.  COMPARISON:  06/05/2015 and 10/06/2009 head CT. 01/19/2015 brain MR and MR angiogram.  FINDINGS: MRI HEAD FINDINGS  Exam is motion degraded.  Acute nonhemorrhagic infarct lateral aspect left thalamus bordering the posterior limb of the left internal capsule.  Question tiny subacute anterior right frontal infarct.  Remote bilateral thalamic infarcts.  Small vessel disease type changes.  No intracranial hemorrhage.  Atrophy with disproportionate enlargement of the lateral ventricles raises possibility of hydrocephalus (central atrophy is a secondary consideration). Aqueduct is patent. Appearance unchanged.  No intracranial mass lesion noted on this unenhanced exam.  Opacification left mastoid air cells  without obstructing lesion identified.  Paranasal sinus mucosal thickening in a diffuse fashion  Cervical medullary junction, pituitary region and pineal region unremarkable. Post lens replacement.  MRA HEAD FINDINGS  Exam is motion degraded. Evaluating for the presence of stenosis or aneurysm is limited.  What can be stated with certainty on the present examination is that there is flow within the internal carotid arteries to level the carotid terminus bilaterally, flow was noted within the vertebral arteries and basilar artery with the right vertebral artery dominant and moderate to marked narrowing of the left vertebral artery suspected.  Medium and small vessel intracranial atherosclerotic type changes suspected but cannot be adequately assessed or confirmed.  Fetal type contribution to the posterior cerebral arteries.  IMPRESSION: MRI HEAD  Exam is motion degraded.  Acute nonhemorrhagic infarct lateral aspect left thalamus bordering the posterior limb of the left internal capsule.  Question tiny subacute anterior right frontal infarct.  Remote bilateral thalamic infarcts.  Small vessel disease type changes.  No intracranial hemorrhage.  Atrophy with disproportionate enlargement of the lateral ventricles raises possibility of hydrocephalus (central atrophy is a secondary consideration). Appearance unchanged.  Opacification left mastoid air cells.  Pan paranasal sinus mucosal thickening.  MRA HEAD  Exam is motion degraded. Evaluating for the presence of stenosis or aneurysm is limited.  What can be stated with certainty on the present examination is that there is flow within the internal carotid arteries to level the carotid terminus bilaterally, flow noted within the vertebral arteries and basilar artery with the right vertebral artery dominant and moderate to marked narrowing of the left vertebral artery suspected.  Medium and small vessel  intracranial atherosclerotic type changes suspected but cannot be adequately assessed or confirmed.   Electronically Signed   By: Genia Del M.D.   On: 06/06/2015 10:19

## 2017-03-12 DIAGNOSIS — N433 Hydrocele, unspecified: Secondary | ICD-10-CM | POA: Diagnosis not present

## 2017-03-14 ENCOUNTER — Telehealth: Payer: Self-pay

## 2017-03-14 DIAGNOSIS — G20 Parkinson's disease: Secondary | ICD-10-CM | POA: Diagnosis not present

## 2017-03-14 DIAGNOSIS — J449 Chronic obstructive pulmonary disease, unspecified: Secondary | ICD-10-CM | POA: Diagnosis not present

## 2017-03-14 DIAGNOSIS — R2689 Other abnormalities of gait and mobility: Secondary | ICD-10-CM | POA: Diagnosis not present

## 2017-03-14 DIAGNOSIS — Z8673 Personal history of transient ischemic attack (TIA), and cerebral infarction without residual deficits: Secondary | ICD-10-CM | POA: Diagnosis not present

## 2017-03-14 NOTE — Telephone Encounter (Signed)
I called neurology and they said they have written him a rx for the chantix already.

## 2017-03-14 NOTE — Telephone Encounter (Signed)
-----   Message from Raylene Everts, MD sent at 02/28/2017 12:48 PM EDT ----- Dr Nicholas Lose Neurology [phone] Please call and have nurse ask doctor if it is safe for [facility] to try chantix for his tobacco addiction??

## 2017-05-23 ENCOUNTER — Telehealth: Payer: Self-pay | Admitting: Family Medicine

## 2017-05-23 NOTE — Telephone Encounter (Signed)
Called patient 2x, busy tone each time. Moved appt and mailed reminder

## 2017-05-31 ENCOUNTER — Ambulatory Visit: Payer: Medicare HMO | Admitting: Family Medicine

## 2017-06-17 ENCOUNTER — Ambulatory Visit: Payer: Medicare HMO | Admitting: Family Medicine

## 2017-06-19 DIAGNOSIS — R55 Syncope and collapse: Secondary | ICD-10-CM | POA: Diagnosis not present

## 2017-06-19 DIAGNOSIS — I679 Cerebrovascular disease, unspecified: Secondary | ICD-10-CM | POA: Diagnosis not present

## 2017-06-19 DIAGNOSIS — R2689 Other abnormalities of gait and mobility: Secondary | ICD-10-CM | POA: Diagnosis not present

## 2017-06-19 DIAGNOSIS — R69 Illness, unspecified: Secondary | ICD-10-CM | POA: Diagnosis not present

## 2017-06-21 DIAGNOSIS — R262 Difficulty in walking, not elsewhere classified: Secondary | ICD-10-CM

## 2017-06-24 ENCOUNTER — Ambulatory Visit (INDEPENDENT_AMBULATORY_CARE_PROVIDER_SITE_OTHER): Payer: Medicare HMO | Admitting: Family Medicine

## 2017-06-24 ENCOUNTER — Encounter: Payer: Self-pay | Admitting: Family Medicine

## 2017-06-24 VITALS — BP 136/84 | HR 68 | Temp 98.9°F | Resp 18 | Ht 67.0 in

## 2017-06-24 DIAGNOSIS — G20 Parkinson's disease: Secondary | ICD-10-CM

## 2017-06-24 DIAGNOSIS — Z720 Tobacco use: Secondary | ICD-10-CM | POA: Diagnosis not present

## 2017-06-24 DIAGNOSIS — E785 Hyperlipidemia, unspecified: Secondary | ICD-10-CM

## 2017-06-24 DIAGNOSIS — Z23 Encounter for immunization: Secondary | ICD-10-CM

## 2017-06-24 DIAGNOSIS — R739 Hyperglycemia, unspecified: Secondary | ICD-10-CM | POA: Diagnosis not present

## 2017-06-24 DIAGNOSIS — Z8349 Family history of other endocrine, nutritional and metabolic diseases: Secondary | ICD-10-CM

## 2017-06-24 DIAGNOSIS — E559 Vitamin D deficiency, unspecified: Secondary | ICD-10-CM

## 2017-06-24 DIAGNOSIS — E538 Deficiency of other specified B group vitamins: Secondary | ICD-10-CM | POA: Diagnosis not present

## 2017-06-24 DIAGNOSIS — I639 Cerebral infarction, unspecified: Secondary | ICD-10-CM | POA: Diagnosis not present

## 2017-06-24 NOTE — Patient Instructions (Signed)
Stop smoking No more than one beer a day No change in medicines Labs today I will send you a letter with your test results.  If there is anything of concern, we will call right away. See me every 3 months Call sooner for problems

## 2017-06-24 NOTE — Progress Notes (Signed)
Chief Complaint  Patient presents with  . Follow-up    3 month  Patient is here today accompanied by his son. He has no acute complaints. He states that he used to get vitamin B12 shots, and has not had one for about 3 months now.  He states he is feeling tired.  I will check a vitamin B12 level and see whether they are needed.  I looked at his old records and he also has a vitamin D deficiency documented.  He is due for lab work today. He continues under the care of neurology and on his same medications.  He continues to have the bilateral upper extremity tremor left greater than right. He is wheelchair-bound because of a stroke.  He had an aide in his home, who left.  Uncertain why.  He now has family caring for him.  I discussed with the son Mr. Pelc needs, and I do believe an aide is necessary.  He is dependent on help for his ADLs. His weight is stable.  His blood pressure is good.  He has had no coughing or wheezing.  No recent infection. He is due for flu shot this is given today. His son informs me that both he and his brother had hemochromatosis.  I am going to check a ferritin level on [place] today.   Patient Active Problem List   Diagnosis Date Noted  . Stroke ([place]) 01/25/2017  . Essential hypertension 06/06/2015  . Hyperlipidemia 06/06/2015  . COPD (chronic obstructive pulmonary disease) ([place]) 06/06/2015  . Multiple falls 06/05/2015  . Cerebral ventriculomegaly 01/20/2015  . Facial droop 01/19/2015  . Tobacco abuse 01/19/2015  . Obesity 01/19/2015  . Depression with anxiety 01/19/2015  . Hyperglycemia 01/19/2015  . Dysarthria 01/15/2015  . Parkinson's disease [facility])     Outpatient Encounter Prescriptions as of 06/24/2017  Medication Sig  . albuterol (PROVENTIL HFA;VENTOLIN HFA) 108 (90 Base) MCG/ACT inhaler Inhale 1 puff into the lungs every 4 (four) hours as needed for wheezing or shortness of breath.  Marland Kitchen aspirin EC 81 MG EC tablet Take 1 tablet (81 mg total) by  mouth daily.  Marland Kitchen atorvastatin (LIPITOR) 10 MG tablet Take 10 mg by mouth daily.  . Carbidopa-Levodopa ER (RYTARY) 36.25-145 MG CPCR Take by mouth.  . clonazePAM (KLONOPIN) 1 MG tablet Take 1 mg by mouth 2 (two) times daily.  . furosemide (LASIX) 20 MG tablet Take 20 mg by mouth daily.  Marland Kitchen gabapentin (NEURONTIN) 300 MG capsule Take 300 mg by mouth 3 (three) times daily.  Marland Kitchen lisinopril (PRINIVIL,ZESTRIL) 5 MG tablet Take 1 tablet (5 mg total) by mouth daily.  . Multiple Vitamins-Minerals (MULTIVITAMIN WITH MINERALS) tablet Take 1 tablet by mouth daily.  Marland Kitchen PARoxetine (PAXIL) 40 MG tablet Take 60 mg by mouth every morning.   Marland Kitchen rOPINIRole (REQUIP) 1 MG tablet Take 1 mg by mouth 2 (two) times daily.   No facility-administered encounter medications on file as of 06/24/2017.     No Known Allergies  Review of Systems  Constitutional: Positive for fatigue. Negative for activity change, appetite change and unexpected weight change.       Chronic  HENT: Negative for congestion and dental problem.   Eyes: Negative for photophobia and visual disturbance.  Respiratory: Negative for cough and shortness of breath.   Cardiovascular: Positive for leg swelling. Negative for chest pain and palpitations.  Gastrointestinal: Negative for constipation and diarrhea.  Genitourinary: Negative for difficulty urinating and scrotal swelling.  Musculoskeletal: Positive  for gait problem. Negative for arthralgias and back pain.  Skin: Negative for rash.  Neurological: Positive for tremors and weakness.  Psychiatric/Behavioral: Positive for dysphoric mood. The patient is nervous/anxious.      BP 136/84 (BP Location: Right Arm, Patient Position: Sitting, Cuff Size: Normal)   Pulse 68   Temp 98.9 F (37.2 C) (Temporal)   Resp 18   Ht 5\' 7"  (1.702 m)   SpO2 96%   Physical Exam  Constitutional: He is oriented to person, place, and time. He appears well-developed and well-nourished.  Wheelchair.   pleasant  HENT:    Head: Normocephalic and atraumatic.  Mouth/Throat: Oropharynx is clear and moist.  Eyes: Pupils are equal, round, and reactive to light. Conjunctivae are normal.  Neck: Normal range of motion. No thyromegaly present.  Cardiovascular: Normal rate, regular rhythm and normal heart sounds.   Pulmonary/Chest: Effort normal and breath sounds normal. He has no wheezes.  Abdominal: Soft. Bowel sounds are normal. There is no tenderness.  Musculoskeletal:  General weakness.  Cogwheels.  Tremor left hand greater than right  Neurological: He is alert and oriented to person, place, and time.  Psychiatric: He has a normal mood and affect. His behavior is normal.    ASSESSMENT/PLAN:  1. Need for influenza vaccination * 2. Parkinson's disease ([place]) * 3. Cerebrovascular accident (CVA), unspecified mechanism ([place])  4. Tobacco abuse *Discussed.  Recommend smoking cessation.  5. Vitamin D deficiency - VITAMIN D 25 Hydroxy (Vit-D Deficiency, Fractures)  6. Vitamin B12 deficiency - Vitamin B12  7. Family history of hemochromatosis - Ferritin  8. Hyperlipidemia, unspecified hyperlipidemia type - CBC with Differential/Platelet - COMPLETE METABOLIC PANEL WITH GFR - Hemoglobin A1c - Lipid panel - Urinalysis, Routine w reflex microscopic  9. Hyperglycemia    Patient Instructions  Stop smoking No more than one beer a day No change in medicines Labs today I will send you a letter with your test results.  If there is anything of concern, we will call right away. See me every 3 months Call sooner for problems   Raylene Everts, MD

## 2017-06-25 LAB — CBC WITH DIFFERENTIAL/PLATELET
BASOS PCT: 1.2 %
Basophils Absolute: 92 cells/uL (ref 0–200)
Eosinophils Absolute: 177 cells/uL (ref 15–500)
Eosinophils Relative: 2.3 %
HEMATOCRIT: 44.5 % (ref 38.5–50.0)
Hemoglobin: 15.4 g/dL (ref 13.2–17.1)
LYMPHS ABS: 2048 {cells}/uL (ref 850–3900)
MCH: 31.8 pg (ref 27.0–33.0)
MCHC: 34.6 g/dL (ref 32.0–36.0)
MCV: 91.9 fL (ref 80.0–100.0)
MPV: 11.1 fL (ref 7.5–12.5)
Monocytes Relative: 6.4 %
NEUTROS PCT: 63.5 %
Neutro Abs: 4890 cells/uL (ref 1500–7800)
Platelets: 222 10*3/uL (ref 140–400)
RBC: 4.84 10*6/uL (ref 4.20–5.80)
RDW: 12 % (ref 11.0–15.0)
Total Lymphocyte: 26.6 %
WBC mixed population: 493 cells/uL (ref 200–950)
WBC: 7.7 10*3/uL (ref 3.8–10.8)

## 2017-06-25 LAB — URINALYSIS, ROUTINE W REFLEX MICROSCOPIC
BILIRUBIN URINE: NEGATIVE
GLUCOSE, UA: NEGATIVE
Hgb urine dipstick: NEGATIVE
Ketones, ur: NEGATIVE
Leukocytes, UA: NEGATIVE
Nitrite: NEGATIVE
PROTEIN: NEGATIVE
Specific Gravity, Urine: 1.016 (ref 1.001–1.03)
pH: >=8.5 — AB (ref 5.0–8.0)

## 2017-06-25 LAB — COMPLETE METABOLIC PANEL WITH GFR
AG RATIO: 1.6 (calc) (ref 1.0–2.5)
ALBUMIN MSPROF: 4 g/dL (ref 3.6–5.1)
ALT: 25 U/L (ref 9–46)
AST: 26 U/L (ref 10–35)
Alkaline phosphatase (APISO): 86 U/L (ref 40–115)
BUN: 14 mg/dL (ref 7–25)
CALCIUM: 9 mg/dL (ref 8.6–10.3)
CO2: 28 mmol/L (ref 20–32)
Chloride: 108 mmol/L (ref 98–110)
Creat: 0.96 mg/dL (ref 0.70–1.25)
GFR, EST AFRICAN AMERICAN: 93 mL/min/{1.73_m2} (ref >=60)
GFR, EST NON AFRICAN AMERICAN: 80 mL/min/{1.73_m2} (ref >=60)
GLOBULIN: 2.5 g/dL (ref 1.9–3.7)
Glucose, Bld: 97 mg/dL (ref 65–139)
POTASSIUM: 4.4 mmol/L (ref 3.5–5.3)
SODIUM: 142 mmol/L (ref 135–146)
TOTAL PROTEIN: 6.5 g/dL (ref 6.1–8.1)
Total Bilirubin: 1.2 mg/dL (ref 0.2–1.2)

## 2017-06-25 LAB — LIPID PANEL
Cholesterol: 123 mg/dL (ref <200)
HDL: 39 mg/dL — ABNORMAL LOW (ref >40)
LDL Cholesterol (Calc): 64 mg/dL (calc)
NON-HDL CHOLESTEROL (CALC): 84 mg/dL (ref <130)
Total CHOL/HDL Ratio: 3.2 (calc) (ref <5.0)
Triglycerides: 117 mg/dL (ref <150)

## 2017-06-25 LAB — VITAMIN D 25 HYDROXY (VIT D DEFICIENCY, FRACTURES): Vit D, 25-Hydroxy: 16 ng/mL — ABNORMAL LOW (ref 30–100)

## 2017-06-25 LAB — FERRITIN: Ferritin: 100 ng/mL (ref 20–380)

## 2017-06-25 LAB — HEMOGLOBIN A1C
EAG (MMOL/L): 5.5 (calc)
HEMOGLOBIN A1C: 5.1 %{Hb} (ref <5.7)
Mean Plasma Glucose: 100 (calc)

## 2017-06-25 LAB — VITAMIN B12: VITAMIN B 12: 485 pg/mL (ref 200–1100)

## 2017-06-26 ENCOUNTER — Encounter: Payer: Self-pay | Admitting: Family Medicine

## 2017-07-04 ENCOUNTER — Telehealth: Payer: Self-pay | Admitting: *Deleted

## 2017-07-04 NOTE — Telephone Encounter (Signed)
Patient does not qualify for the referral that was sent due to having medicare, and patient will not qualify for capps either. Patient's can only qualify that have medicaid, patient has [place] medicare.

## 2017-07-05 NOTE — Telephone Encounter (Signed)
noted 

## 2017-07-17 DIAGNOSIS — M545 Low back pain: Secondary | ICD-10-CM | POA: Diagnosis not present

## 2017-07-17 DIAGNOSIS — M1712 Unilateral primary osteoarthritis, left knee: Secondary | ICD-10-CM | POA: Diagnosis not present

## 2017-07-17 DIAGNOSIS — M1711 Unilateral primary osteoarthritis, right knee: Secondary | ICD-10-CM | POA: Diagnosis not present

## 2017-08-12 DIAGNOSIS — I69398 Other sequelae of cerebral infarction: Secondary | ICD-10-CM | POA: Diagnosis not present

## 2017-08-12 DIAGNOSIS — R2689 Other abnormalities of gait and mobility: Secondary | ICD-10-CM | POA: Diagnosis not present

## 2017-08-12 DIAGNOSIS — R69 Illness, unspecified: Secondary | ICD-10-CM | POA: Diagnosis not present

## 2017-08-12 DIAGNOSIS — I10 Essential (primary) hypertension: Secondary | ICD-10-CM | POA: Diagnosis not present

## 2017-08-12 DIAGNOSIS — G20 Parkinson's disease: Secondary | ICD-10-CM | POA: Diagnosis not present

## 2017-08-12 DIAGNOSIS — Z720 Tobacco use: Secondary | ICD-10-CM | POA: Diagnosis not present

## 2017-08-12 DIAGNOSIS — Z7982 Long term (current) use of aspirin: Secondary | ICD-10-CM | POA: Diagnosis not present

## 2017-09-06 ENCOUNTER — Telehealth: Payer: Self-pay | Admitting: Family Medicine

## 2017-09-06 NOTE — Telephone Encounter (Signed)
Called [name], advised per dr Meda Coffee she does not feel this is a safe medication for him. He states his urologist usually writes this for him, and he will call there.

## 2017-09-06 NOTE — Telephone Encounter (Signed)
Agree 

## 2017-09-06 NOTE — Telephone Encounter (Signed)
Pt is calling in needs Viagra Called in today --He uses [facility], you can call him at [phone]

## 2017-09-11 DIAGNOSIS — R2689 Other abnormalities of gait and mobility: Secondary | ICD-10-CM | POA: Diagnosis not present

## 2017-09-11 DIAGNOSIS — R251 Tremor, unspecified: Secondary | ICD-10-CM | POA: Diagnosis not present

## 2017-09-11 DIAGNOSIS — G20 Parkinson's disease: Secondary | ICD-10-CM | POA: Diagnosis not present

## 2017-09-11 DIAGNOSIS — R55 Syncope and collapse: Secondary | ICD-10-CM | POA: Diagnosis not present

## 2017-09-20 ENCOUNTER — Ambulatory Visit: Payer: Medicare HMO | Admitting: Family Medicine

## 2017-09-27 ENCOUNTER — Ambulatory Visit: Payer: Medicare HMO | Admitting: Family Medicine

## 2017-10-03 ENCOUNTER — Ambulatory Visit: Payer: Medicare HMO | Admitting: Family Medicine

## 2017-10-07 ENCOUNTER — Other Ambulatory Visit: Payer: Self-pay

## 2017-10-07 ENCOUNTER — Encounter: Payer: Self-pay | Admitting: Family Medicine

## 2017-10-07 ENCOUNTER — Ambulatory Visit (INDEPENDENT_AMBULATORY_CARE_PROVIDER_SITE_OTHER): Payer: Medicare HMO | Admitting: Family Medicine

## 2017-10-07 VITALS — BP 134/82 | HR 72 | Temp 98.4°F | Resp 20 | Ht 67.0 in

## 2017-10-07 DIAGNOSIS — K219 Gastro-esophageal reflux disease without esophagitis: Secondary | ICD-10-CM | POA: Diagnosis not present

## 2017-10-07 DIAGNOSIS — Z23 Encounter for immunization: Secondary | ICD-10-CM

## 2017-10-07 DIAGNOSIS — E538 Deficiency of other specified B group vitamins: Secondary | ICD-10-CM | POA: Diagnosis not present

## 2017-10-07 DIAGNOSIS — G20 Parkinson's disease: Secondary | ICD-10-CM

## 2017-10-07 DIAGNOSIS — R600 Localized edema: Secondary | ICD-10-CM

## 2017-10-07 DIAGNOSIS — G8929 Other chronic pain: Secondary | ICD-10-CM | POA: Diagnosis not present

## 2017-10-07 DIAGNOSIS — M25562 Pain in left knee: Secondary | ICD-10-CM | POA: Diagnosis not present

## 2017-10-07 MED ORDER — CYANOCOBALAMIN 1000 MCG/ML IJ SOLN
1000.0000 ug | Freq: Once | INTRAMUSCULAR | Status: AC
  Administered 2017-10-07: 1000 ug via INTRAMUSCULAR

## 2017-10-07 MED ORDER — FUROSEMIDE 40 MG PO TABS: 40.0000 mg | ORAL_TABLET | Freq: Every day | ORAL | 3 refills | Status: DC

## 2017-10-07 MED ORDER — OMEPRAZOLE 20 MG PO CPDR: 20.0000 mg | DELAYED_RELEASE_CAPSULE | Freq: Every day | ORAL | 3 refills | Status: DC

## 2017-10-07 NOTE — Patient Instructions (Signed)
Limit salt in the diet Increase the lasix to 40 mg a day Make sure you eat potassium foods Take advil or aleve for knee pain Take the omeprazole daily for a month then as needed afterwards See me in 3 months Labs before visit

## 2017-10-07 NOTE — Progress Notes (Signed)
Chief Complaint  Patient presents with  . Edema    BLE   Patient is here for his routine follow-up.  He comes in every 3 months. He continues under the care of a neurologist in [place].  He states there is been no changes in his medications.  His Parkinson's disease appears to be stable. He is accompanied by his sister.  She is 1 of his caregivers.  She agrees that this not been much change in his health status.  She wishes he would walk more as he is largely wheelchair-bound and very weak.  She states that he is continuing to smoke cigarettes, about half pack a day, he states he is trying to quit. His appetite is good.  No problems with bowels or digestion.  He uses regular stool softeners. Denies any problems with kidneys or bladder. He has intermittent pain in his left knee.  It happens a couple times a week.  Responds to Advil or Aleve.  No buckling instability warmth or effusion.  No x-rays. He has recurring edema in his ankles.  Some days worse than others.  Discussed the basics of a low salt diet, leg elevation, compression.  I will double his Lasix to 40 mg a day as it is been quite uncomfortable for him lately. Since he has been taking more anti-inflammatories, he has noticed more heartburn.  I am going to put him on omeprazole 20 a day for a month, then as needed thereafter.  No blood in bowels.  No nausea or vomiting.  No severe pain. Patient has not had a fall since his last visit. He had a Pneumovax 23 in 2017.  He is due for his Prevnar 13 today He has a history of vitamin B12 deficiency.  He states he feels better when he takes vitamin B12 injections.  He has not had one for several months.  We did a vitamin B12 level in October and it was over 400.  Because of his history of deficiency I will give him an injection today, but I am encouraging him to take an oral vitamin B12.  Patient Active Problem List   Diagnosis Date Noted  . Stroke ([place]) 01/25/2017  . Essential  hypertension 06/06/2015  . Hyperlipidemia 06/06/2015  . COPD (chronic obstructive pulmonary disease) ([place]) 06/06/2015  . Multiple falls 06/05/2015  . Cerebral ventriculomegaly 01/20/2015  . Facial droop 01/19/2015  . Tobacco abuse 01/19/2015  . Obesity 01/19/2015  . Depression with anxiety 01/19/2015  . Hyperglycemia 01/19/2015  . Dysarthria 01/15/2015  . Parkinson's disease [facility])     Outpatient Encounter Medications as of 10/07/2017  Medication Sig  . albuterol (PROVENTIL HFA;VENTOLIN HFA) 108 (90 Base) MCG/ACT inhaler Inhale 1 puff into the lungs every 4 (four) hours as needed for wheezing or shortness of breath.  Marland Kitchen aspirin EC 81 MG EC tablet Take 1 tablet (81 mg total) by mouth daily.  Marland Kitchen atorvastatin (LIPITOR) 10 MG tablet Take 10 mg by mouth daily.  . Carbidopa-Levodopa ER (RYTARY) 36.25-145 MG CPCR Take by mouth.  . clonazePAM (KLONOPIN) 1 MG tablet Take 1 mg by mouth 2 (two) times daily.  Marland Kitchen gabapentin (NEURONTIN) 300 MG capsule Take 300 mg by mouth 3 (three) times daily.  Marland Kitchen lisinopril (PRINIVIL,ZESTRIL) 5 MG tablet Take 1 tablet (5 mg total) by mouth daily.  . Multiple Vitamins-Minerals (MULTIVITAMIN WITH MINERALS) tablet Take 1 tablet by mouth daily.  Marland Kitchen PARoxetine (PAXIL) 40 MG tablet Take 60 mg by mouth every morning.   Marland Kitchen  rOPINIRole (REQUIP) 1 MG tablet Take 1 mg by mouth 2 (two) times daily.  . tadalafil (CIALIS) 10 MG tablet TAKE 1 TABLET ORALLY AS NEEDED  . [DISCONTINUED] furosemide (LASIX) 20 MG tablet Take 20 mg by mouth daily.  . furosemide (LASIX) 40 MG tablet Take 1 tablet (40 mg total) by mouth daily.  Marland Kitchen omeprazole (PRILOSEC) 20 MG capsule Take 1 capsule (20 mg total) by mouth daily.  . [DISCONTINUED] RYTARY 23.75-95 MG CPCR TAKE 1-2 CAPSULES BY MOUTH 3 TIMES A DAY  . [EXPIRED] cyanocobalamin ((VITAMIN B-12)) injection 1,000 mcg    No facility-administered encounter medications on file as of 10/07/2017.     No Known Allergies  Review of Systems  Constitutional:  Positive for fatigue. Negative for activity change, appetite change and unexpected weight change.       Chronic  HENT: Negative for congestion and dental problem.   Eyes: Negative for photophobia and visual disturbance.  Respiratory: Negative for cough and shortness of breath.   Cardiovascular: Positive for leg swelling. Negative for chest pain and palpitations.  Gastrointestinal: Negative for constipation and diarrhea.       Heartburn  Genitourinary: Negative for difficulty urinating and scrotal swelling.  Musculoskeletal: Positive for gait problem. Negative for arthralgias and back pain.       Generalized weakness and gait problems.  New complaint of left knee pain  Skin: Negative for rash.  Neurological: Positive for tremors and weakness.  Psychiatric/Behavioral: Positive for dysphoric mood. The patient is nervous/anxious.     BP 134/82 (BP Location: Right Arm, Patient Position: Sitting, Cuff Size: Normal)   Pulse 72   Temp 98.4 F (36.9 C) (Temporal)   Resp 20   Ht 5\' 7"  (1.702 m)   SpO2 96%   BMI 31.18 kg/m   Physical Exam  Constitutional: He is oriented to person, place, and time. He appears well-developed and well-nourished.  Wheelchair.   pleasant  HENT:  Head: Normocephalic and atraumatic.  Mouth/Throat: Oropharynx is clear and moist.  Eyes: Conjunctivae are normal. Pupils are equal, round, and reactive to light.  Neck: Normal range of motion. No thyromegaly present.  Cardiovascular: Normal rate, regular rhythm and normal heart sounds.  Pulmonary/Chest: Effort normal and breath sounds normal. He has no wheezes.  Abdominal: Soft. Bowel sounds are normal. There is no tenderness.  Musculoskeletal: He exhibits edema.  General weakness.  Cogwheels.  Tremor left hand greater than right.  Very large 3+ edema both ankles.  Mild hyperemia of ankles.  Neurological: He is alert and oriented to person, place, and time.  Psychiatric: He has a normal mood and affect. His behavior  is normal.  Reduce facial expression    ASSESSMENT/PLAN:  1. Need for prophylactic vaccination against Streptococcus pneumoniae (pneumococcus) Done  2. Parkinson disease ([place]) Stable  3. Pedal edema Increased.  We will double his Lasix and check electrolytes next visit.  Cautioned to eat more potassium - BASIC METABOLIC PANEL WITH GFR  4. Gastroesophageal reflux disease, esophagitis presence not specified Add omeprazole 20 mg a day  5. Chronic pain of left knee Continue with as needed anti-inflammatories.  Knee exam is benign today.  6. Vitamin B12 deficiency By history - cyanocobalamin ((VITAMIN B-12)) injection 1,000 mcg   Patient Instructions  Limit salt in the diet Increase the lasix to 40 mg a day Make sure you eat potassium foods Take advil or aleve for knee pain Take the omeprazole daily for a month then as needed afterwards See me in 3  months Labs before visit     Raylene Everts, MD

## 2017-10-08 ENCOUNTER — Ambulatory Visit: Payer: Medicare HMO | Admitting: Family Medicine

## 2017-11-03 ENCOUNTER — Emergency Department (HOSPITAL_COMMUNITY)
Admission: EM | Admit: 2017-11-03 | Discharge: 2017-11-03 | Disposition: A | Discharge status: Home / Self Care | Payer: Medicare HMO | Attending: Emergency Medicine | Admitting: Emergency Medicine

## 2017-11-03 ENCOUNTER — Emergency Department (HOSPITAL_COMMUNITY): Payer: Medicare HMO

## 2017-11-03 ENCOUNTER — Encounter (HOSPITAL_COMMUNITY): Payer: Self-pay | Admitting: Emergency Medicine

## 2017-11-03 DIAGNOSIS — G20 Parkinson's disease: Secondary | ICD-10-CM | POA: Insufficient documentation

## 2017-11-03 DIAGNOSIS — I10 Essential (primary) hypertension: Secondary | ICD-10-CM | POA: Diagnosis not present

## 2017-11-03 DIAGNOSIS — R1031 Right lower quadrant pain: Secondary | ICD-10-CM | POA: Insufficient documentation

## 2017-11-03 DIAGNOSIS — R600 Localized edema: Secondary | ICD-10-CM | POA: Diagnosis not present

## 2017-11-03 DIAGNOSIS — R1110 Vomiting, unspecified: Secondary | ICD-10-CM | POA: Diagnosis not present

## 2017-11-03 DIAGNOSIS — R197 Diarrhea, unspecified: Secondary | ICD-10-CM | POA: Diagnosis not present

## 2017-11-03 DIAGNOSIS — Z7982 Long term (current) use of aspirin: Secondary | ICD-10-CM | POA: Insufficient documentation

## 2017-11-03 DIAGNOSIS — I7409 Other arterial embolism and thrombosis of abdominal aorta: Secondary | ICD-10-CM | POA: Insufficient documentation

## 2017-11-03 DIAGNOSIS — R1030 Lower abdominal pain, unspecified: Secondary | ICD-10-CM | POA: Diagnosis not present

## 2017-11-03 DIAGNOSIS — R062 Wheezing: Secondary | ICD-10-CM | POA: Insufficient documentation

## 2017-11-03 DIAGNOSIS — J449 Chronic obstructive pulmonary disease, unspecified: Secondary | ICD-10-CM | POA: Insufficient documentation

## 2017-11-03 DIAGNOSIS — N329 Bladder disorder, unspecified: Secondary | ICD-10-CM | POA: Insufficient documentation

## 2017-11-03 DIAGNOSIS — R21 Rash and other nonspecific skin eruption: Secondary | ICD-10-CM | POA: Diagnosis not present

## 2017-11-03 DIAGNOSIS — N3289 Other specified disorders of bladder: Secondary | ICD-10-CM | POA: Diagnosis not present

## 2017-11-03 DIAGNOSIS — F17210 Nicotine dependence, cigarettes, uncomplicated: Secondary | ICD-10-CM | POA: Diagnosis not present

## 2017-11-03 DIAGNOSIS — Z79899 Other long term (current) drug therapy: Secondary | ICD-10-CM | POA: Diagnosis not present

## 2017-11-03 DIAGNOSIS — R109 Unspecified abdominal pain: Secondary | ICD-10-CM | POA: Diagnosis not present

## 2017-11-03 DIAGNOSIS — Z8673 Personal history of transient ischemic attack (TIA), and cerebral infarction without residual deficits: Secondary | ICD-10-CM | POA: Diagnosis not present

## 2017-11-03 DIAGNOSIS — I350 Nonrheumatic aortic (valve) stenosis: Secondary | ICD-10-CM | POA: Diagnosis not present

## 2017-11-03 LAB — COMPREHENSIVE METABOLIC PANEL
ALK PHOS: 83 U/L (ref 38–126)
ALT: 8 U/L — AB (ref 17–63)
AST: 22 U/L (ref 15–41)
Albumin: 3.6 g/dL (ref 3.5–5.0)
Anion gap: 9 (ref 5–15)
BUN: 21 mg/dL — ABNORMAL HIGH (ref 6–20)
CALCIUM: 8.7 mg/dL — AB (ref 8.9–10.3)
CO2: 24 mmol/L (ref 22–32)
CREATININE: 1 mg/dL (ref 0.61–1.24)
Chloride: 108 mmol/L (ref 101–111)
GFR calc non Af Amer: >60 mL/min (ref >60)
GLUCOSE: 111 mg/dL — AB (ref 65–99)
Potassium: 3.8 mmol/L (ref 3.5–5.1)
SODIUM: 141 mmol/L (ref 135–145)
Total Bilirubin: 1.3 mg/dL — ABNORMAL HIGH (ref 0.3–1.2)
Total Protein: 6.4 g/dL — ABNORMAL LOW (ref 6.5–8.1)

## 2017-11-03 LAB — POC OCCULT BLOOD, ED: FECAL OCCULT BLD: NEGATIVE

## 2017-11-03 LAB — CBC WITH DIFFERENTIAL/PLATELET
BASOS PCT: 1 %
Basophils Absolute: 0.1 10*3/uL (ref 0.0–0.1)
EOS ABS: 0.1 10*3/uL (ref 0.0–0.7)
EOS PCT: 2 %
HCT: 42.7 % (ref 39.0–52.0)
Hemoglobin: 14.3 g/dL (ref 13.0–17.0)
Lymphocytes Relative: 21 %
Lymphs Abs: 1.5 10*3/uL (ref 0.7–4.0)
MCH: 31.7 pg (ref 26.0–34.0)
MCHC: 33.5 g/dL (ref 30.0–36.0)
MCV: 94.7 fL (ref 78.0–100.0)
MONO ABS: 0.4 10*3/uL (ref 0.1–1.0)
MONOS PCT: 6 %
NEUTROS PCT: 70 %
Neutro Abs: 5.3 10*3/uL (ref 1.7–7.7)
PLATELETS: 173 10*3/uL (ref 150–400)
RBC: 4.51 MIL/uL (ref 4.22–5.81)
RDW: 13.5 % (ref 11.5–15.5)
WBC: 7.4 10*3/uL (ref 4.0–10.5)

## 2017-11-03 LAB — URINALYSIS, ROUTINE W REFLEX MICROSCOPIC
Bilirubin Urine: NEGATIVE
Glucose, UA: NEGATIVE mg/dL
HGB URINE DIPSTICK: NEGATIVE
Ketones, ur: NEGATIVE mg/dL
Leukocytes, UA: NEGATIVE
Nitrite: NEGATIVE
Protein, ur: NEGATIVE mg/dL
SPECIFIC GRAVITY, URINE: 1.032 — AB (ref 1.005–1.030)
pH: 7 (ref 5.0–8.0)

## 2017-11-03 MED ORDER — IOPAMIDOL (ISOVUE-300) INJECTION 61%
100.0000 mL | Freq: Once | INTRAVENOUS | Status: AC | PRN
  Administered 2017-11-03: 100 mL via INTRAVENOUS

## 2017-11-03 MED ORDER — NYSTATIN-TRIAMCINOLONE 100000-0.1 UNIT/GM-% EX OINT: 1.0000 "application " | TOPICAL_OINTMENT | Freq: Two times a day (BID) | CUTANEOUS | 0 refills | Status: DC

## 2017-11-03 MED ORDER — MORPHINE SULFATE (PF) 2 MG/ML IV SOLN: 2.0000 mg | Freq: Once | INTRAVENOUS | Status: DC

## 2017-11-03 NOTE — ED Notes (Signed)
Bladder scan 298ml.  Pt states he could void if necessary, but does not feel the urge at this time.

## 2017-11-03 NOTE — ED Notes (Signed)
Manuel bp 130/90

## 2017-11-03 NOTE — ED Provider Notes (Signed)
[facility] EMERGENCY DEPARTMENT Provider Note   CSN: [id number] Arrival date & time: 11/03/17  1314     History   Chief Complaint Chief Complaint  Patient presents with  . Abdominal Pain    HPI Lucas [name] is a 72 y.o. male.  Complains of right suprapubic pain, nonradiating gradual onset 10 days ago.  Last bowel movement this morning, normal.  Pain worse with changing positions and improved with rest.  No treatment prior to coming here.  No fever.  No anorexia.  He is chronically incontinent of urine.  No other associated symptoms.  HPI  Past Medical History:  Diagnosis Date  . Acute CVA (cerebrovascular accident) ([place]) 06/06/2015  . Acute ischemic stroke ([place]) 01/19/2015   Left midbrain/thalamus.  . Alcohol use   . Anxiety   . Cataract   . Cerebral ventriculomegaly 01/20/2015  . COPD (chronic obstructive pulmonary disease) ([place])   . Depression   . Depression with anxiety   . Hyperlipidemia   . Hypertension   . Neuromuscular disorder ([place])   . Parkinson's disease ([place])   . Substance abuse ([place])   . Tobacco abuse     Patient Active Problem List   Diagnosis Date Noted  . Stroke ([place]) 01/25/2017  . Essential hypertension 06/06/2015  . Hyperlipidemia 06/06/2015  . COPD (chronic obstructive pulmonary disease) ([place]) 06/06/2015  . Multiple falls 06/05/2015  . Cerebral ventriculomegaly 01/20/2015  . Facial droop 01/19/2015  . Tobacco abuse 01/19/2015  . Obesity 01/19/2015  . Depression with anxiety 01/19/2015  . Hyperglycemia 01/19/2015  . Dysarthria 01/15/2015  . Parkinson's disease [facility])     Past Surgical History:  Procedure Laterality Date  . EYE SURGERY    . SHOULDER SURGERY         Home Medications    Prior to Admission medications   Medication Sig Start Date End Date Taking? Authorizing Provider  albuterol (PROVENTIL HFA;VENTOLIN HFA) 108 (90 Base) MCG/ACT inhaler Inhale 1 puff into the lungs every 4 (four) hours as needed for wheezing or  shortness of breath.    [provider]  aspirin EC 81 MG EC tablet Take 1 tablet (81 mg total) by mouth daily. 01/16/15   Isaac Bliss, [name], MD  atorvastatin (LIPITOR) 10 MG tablet Take 10 mg by mouth daily. 04/27/15   [provider]  Carbidopa-Levodopa ER (RYTARY) 36.25-145 MG CPCR Take by mouth.    [provider]  clonazePAM (KLONOPIN) 1 MG tablet Take 1 mg by mouth 2 (two) times daily. 05/08/16   [provider]  furosemide (LASIX) 40 MG tablet Take 1 tablet (40 mg total) by mouth daily. 10/07/17   Raylene Everts, MD  gabapentin (NEURONTIN) 300 MG capsule Take 300 mg by mouth 3 (three) times daily.    [provider]  lisinopril (PRINIVIL,ZESTRIL) 5 MG tablet Take 1 tablet (5 mg total) by mouth daily. 01/25/17   Raylene Everts, MD  Multiple Vitamins-Minerals (MULTIVITAMIN WITH MINERALS) tablet Take 1 tablet by mouth daily.    [provider]  omeprazole (PRILOSEC) 20 MG capsule Take 1 capsule (20 mg total) by mouth daily. 10/07/17   Raylene Everts, MD  PARoxetine (PAXIL) 40 MG tablet Take 60 mg by mouth every morning.     [provider]  rOPINIRole (REQUIP) 1 MG tablet Take 1 mg by mouth 2 (two) times daily.    [provider]  tadalafil (CIALIS) 10 MG tablet TAKE 1 TABLET ORALLY AS NEEDED 10/03/17  [provider]    Family History Family History  Problem Relation Age of Onset  . Cancer Father        lung  . Cancer Brother        lung  . Early death Brother        suicide  . Early death Brother        mva  . COPD Sister   . Diabetes Sister   . Heart disease Sister   . Early death Sister        fire    Social History Social History   Tobacco Use  . Smoking status: Current Every Day Smoker    Packs/day: 1.00    Types: Cigarettes  . Smokeless tobacco: Never Used  Substance Use Topics  . Alcohol use: No    Comment: former  . Drug use: No     Allergies   Patient has no  known allergies.   Review of Systems Review of Systems  Constitutional: Negative.   HENT: Negative.   Respiratory: Positive for wheezing.        Wheezing, unchanged, denies shortness of breath  Cardiovascular: Positive for leg swelling. Negative for chest pain.       Chronic bilateral leg edema  Gastrointestinal: Positive for abdominal distention and abdominal pain.  Genitourinary:       Chronic urinary incontinence  Musculoskeletal: Positive for gait problem.       Walks with walker occasionally, mostly wheelchair-bound  Skin: Negative.   Allergic/Immunologic: Negative.   Neurological: Positive for tremors.       Chronic parkinsonian tremor  Psychiatric/Behavioral: Negative.   All other systems reviewed and are negative.    Physical Exam Updated Vital Signs BP 131/70   Pulse 67   Temp 98 F (36.7 C) (Oral)   Resp (!) 22   Ht 5\' 7"  (1.702 m)   Wt 83.9 kg (185 lb)   SpO2 99%   BMI 28.98 kg/m   Physical Exam  Constitutional: He is oriented to person, place, and time. No distress.  Chronically ill-appearing  HENT:  Head: Normocephalic and atraumatic.  Eyes: Conjunctivae are normal. Pupils are equal, round, and reactive to light.  Neck: Neck supple. No tracheal deviation present. No thyromegaly present.  Cardiovascular: Normal rate and regular rhythm.  No murmur heard. Pulmonary/Chest: Effort normal. He has wheezes.  Expiratory wheezes  Abdominal: Soft. Bowel sounds are normal. He exhibits no distension. There is tenderness.  Tender at right suprapubic area  Genitourinary: Prostate normal and penis normal. Rectal exam shows guaiac negative stool.  Genitourinary Comments: Uncircumcised.  Scrotum normal.  Rectum normal tone brown stool Hemoccult negative.  Prostate normal  Musculoskeletal: Normal range of motion. He exhibits edema. He exhibits no tenderness.  2-Plus pretibial pitting edema bilaterally  Neurological: He is alert and oriented to person, place, and time.  Coordination normal.  Skin: Skin is warm and dry. Rash noted.  There is a cottage cheese moist appearing pinkish rash which appears fungal at bilateral inguinal creases.  Otherwise without rash  Psychiatric: He has a normal mood and affect.  Nursing note and vitals reviewed.    ED Treatments / Results  Labs (all labs ordered are listed, but only abnormal results are displayed) Labs Reviewed - No data to display  EKG  EKG Interpretation None      Results for orders placed or performed during the hospital encounter of 11/03/17  Urinalysis, Routine w reflex microscopic  Result Value Ref Range  Color, Urine YELLOW YELLOW   APPearance CLEAR CLEAR   Specific Gravity, Urine 1.032 (H) 1.005 - 1.030   pH 7.0 5.0 - 8.0   Glucose, UA NEGATIVE NEGATIVE mg/dL   Hgb urine dipstick NEGATIVE NEGATIVE   Bilirubin Urine NEGATIVE NEGATIVE   Ketones, ur NEGATIVE NEGATIVE mg/dL   Protein, ur NEGATIVE NEGATIVE mg/dL   Nitrite NEGATIVE NEGATIVE   Leukocytes, UA NEGATIVE NEGATIVE  Comprehensive metabolic panel  Result Value Ref Range   Sodium 141 135 - 145 mmol/L   Potassium 3.8 3.5 - 5.1 mmol/L   Chloride 108 101 - 111 mmol/L   CO2 24 22 - 32 mmol/L   Glucose, Bld 111 (H) 65 - 99 mg/dL   BUN 21 (H) 6 - 20 mg/dL   Creatinine, Ser 1.00 0.61 - 1.24 mg/dL   Calcium 8.7 (L) 8.9 - 10.3 mg/dL   Total Protein 6.4 (L) 6.5 - 8.1 g/dL   Albumin 3.6 3.5 - 5.0 g/dL   AST 22 15 - 41 U/L   ALT 8 (L) 17 - 63 U/L   Alkaline Phosphatase 83 38 - 126 U/L   Total Bilirubin 1.3 (H) 0.3 - 1.2 mg/dL   GFR calc non Af Amer >60 >60 mL/min   GFR calc Af Amer >60 >60 mL/min   Anion gap 9 5 - 15  CBC with Differential/Platelet  Result Value Ref Range   WBC 7.4 4.0 - 10.5 K/uL   RBC 4.51 4.22 - 5.81 MIL/uL   Hemoglobin 14.3 13.0 - 17.0 g/dL   HCT 42.7 39.0 - 52.0 %   MCV 94.7 78.0 - 100.0 fL   MCH 31.7 26.0 - 34.0 pg   MCHC 33.5 30.0 - 36.0 g/dL   RDW 13.5 11.5 - 15.5 %   Platelets 173 150 - 400 K/uL    Neutrophils Relative % 70 %   Neutro Abs 5.3 1.7 - 7.7 K/uL   Lymphocytes Relative 21 %   Lymphs Abs 1.5 0.7 - 4.0 K/uL   Monocytes Relative 6 %   Monocytes Absolute 0.4 0.1 - 1.0 K/uL   Eosinophils Relative 2 %   Eosinophils Absolute 0.1 0.0 - 0.7 K/uL   Basophils Relative 1 %   Basophils Absolute 0.1 0.0 - 0.1 K/uL  POC occult blood, ED  Result Value Ref Range   Fecal Occult Bld NEGATIVE NEGATIVE   Ct Abdomen Pelvis W Contrast  Result Date: 11/03/2017 CLINICAL DATA:  The patient has been having abdominal pain, especially on the right, for 10 days. No nausea, vomiting, or diarrhea. EXAM: CT ABDOMEN AND PELVIS WITH CONTRAST TECHNIQUE: Multidetector CT imaging of the abdomen and pelvis was performed using the standard protocol following bolus administration of intravenous contrast. CONTRAST:  153mL ISOVUE-300 IOPAMIDOL (ISOVUE-300) INJECTION 61% COMPARISON:  None. FINDINGS: Lower chest: No acute abnormality. Hepatobiliary: No focal liver abnormality is seen. No gallstones, gallbladder wall thickening, or biliary dilatation. Pancreas: Unremarkable. No pancreatic ductal dilatation or surrounding inflammatory changes. Spleen: Normal in size without focal abnormality. Adrenals/Urinary Tract: The adrenal glands are normal. The right kidney is not visualized, potentially congenitally absent. The left kidney is normal in appearance. The left ureter is normal in caliber with no stones. There is a filling defect in the bladder as seen on series 2, image 76 and sagittal image 69. This defect is contiguous with an enlarged right gonadal vein. The remainder of the bladder is normal. Stomach/Bowel: The stomach and small bowel are normal. Diverticulosis is seen in the colon without diverticulitis.  The remainder of the colon is normal. The appendix is unremarkable. Vascular/Lymphatic: Atherosclerosis is seen in the nonaneurysmal aorta. The abdominal aorta is completely opacified from just after the IMA takeoff on  axial image 43 through the bifurcation. The common iliac arteries are completely opacified and very small in caliber as well. The distal external iliac arteries reconstitute via enlarged collaterals. The IMA is also enlarged. The femoral arteries are well opacified. No adenopathy. Reproductive: Absence of the right seminal vesicle. The prostate and left seminal vesicle are normal. The filling defect in the bladder is most likely associated with the enlarged/dilated gonadal vein rather than enlargement of the prostate. Other: Fat containing inguinal hernias identified. Musculoskeletal: No acute or significant osseous findings. IMPRESSION: 1. Complete opacification of the abdominal aorta just inferior to the IMA takeoff. The opacification extends through the common iliac arteries into a portion of the external iliac arteries. The more distal external iliac arteries are reconstituted via collaterals. Given these collaterals, the small caliber of the unopacified common iliac arteries, and the enlarged caliber of the IMA, the opacification of the aorta is favored to be nonacute. Recommend clinical correlation. 2. Filling defect in the bladder contiguous with a dilated right gonadal vein. The filling defect may represent a varix. A bladder mass or impression on the bladder from an asymmetrically enlarged prostate are considered less likely but not completely excluded. Recommend urologic consultation and cystoscopy. 3. Congenital absence of the right kidney. 4. Atherosclerosis in the abdominal aorta. Electronically Signed   By: Dorise Bullion III M.D   On: 11/03/2017 16:22    Radiology No results found.  Procedures Procedures (including critical care time)  Medications Ordered in ED Medications - No data to display   5 PM patient asymptomatic without treatment.  He declined pain medicine. I discussed case with Dr. early, vascular surgeon feels that patient's chronically obstructed aorta does not require  further treatment.  He is happy to see the patient in follow-up.  I have also instructed patient that he has abnormality of his bladder and to follow-up with a urologist for cystoscopy..  He has a urologist in [place].  He can take Tylenol for pain.  Prescription for Mycolog topical ointment for fungal rash in groin.  Groin. Initial Impression / Assessment and Plan / ED Course  I have reviewed the triage vital signs and the nursing notes.  Pertinent labs & imaging results that were available during my care of the patient were reviewed by me and considered in my medical decision making (see chart for details).       Final Clinical Impressions(s) / ED Diagnoses  Diagnosis #1 pain at right groin #2 rash #3 chronically obstructed aorta #4 bladder abnormality Final diagnoses:  None    ED Discharge Orders    None       Orlie Dakin, MD 11/03/17 1744

## 2017-11-03 NOTE — ED Triage Notes (Signed)
Pt has been having abd pain, especially on right lower side, for 10 days.  Denies n/v/d.  Abdomen tight and distended.  Audible wheezing noted, but pt reports this is normal for him.

## 2017-11-03 NOTE — Discharge Instructions (Signed)
It is okay to take Tylenol 650 mg every 4 hours as directed for pain.  Wash the area at your groins on both sides daily with soap and water and keep the areas clean and dry.  Apply the ointment prescribed today to help with rash.  Call Dr. Luther Parody office your earliest convenience to discuss the blockage in your aorta.  It will not need further treatment.  Call your urologist in [place] tomorrow to schedule an office appointment.  You may need a procedure noticed a cystoscopy to look inside your bladder to be performed by the urologist.  Take the copy of the CT scan performed today with you to your urologist office.  Dr. Donnetta Hutching has access to your CT scan results.  Return if concern for any reason

## 2017-11-04 ENCOUNTER — Encounter: Payer: Self-pay | Admitting: Family Medicine

## 2017-12-21 ENCOUNTER — Encounter (HOSPITAL_COMMUNITY): Payer: Self-pay | Admitting: Emergency Medicine

## 2017-12-21 ENCOUNTER — Emergency Department (HOSPITAL_COMMUNITY): Payer: Medicare HMO

## 2017-12-21 ENCOUNTER — Other Ambulatory Visit: Payer: Self-pay

## 2017-12-21 ENCOUNTER — Emergency Department (HOSPITAL_COMMUNITY)
Admission: EM | Admit: 2017-12-21 | Discharge: 2017-12-21 | Disposition: A | Discharge status: Home / Self Care | Payer: Medicare HMO | Attending: Emergency Medicine | Admitting: Emergency Medicine

## 2017-12-21 DIAGNOSIS — Z79899 Other long term (current) drug therapy: Secondary | ICD-10-CM | POA: Diagnosis not present

## 2017-12-21 DIAGNOSIS — Z7982 Long term (current) use of aspirin: Secondary | ICD-10-CM | POA: Insufficient documentation

## 2017-12-21 DIAGNOSIS — I10 Essential (primary) hypertension: Secondary | ICD-10-CM | POA: Diagnosis not present

## 2017-12-21 DIAGNOSIS — G20 Parkinson's disease: Secondary | ICD-10-CM | POA: Insufficient documentation

## 2017-12-21 DIAGNOSIS — Z8673 Personal history of transient ischemic attack (TIA), and cerebral infarction without residual deficits: Secondary | ICD-10-CM | POA: Insufficient documentation

## 2017-12-21 DIAGNOSIS — R5383 Other fatigue: Secondary | ICD-10-CM | POA: Diagnosis not present

## 2017-12-21 DIAGNOSIS — F17210 Nicotine dependence, cigarettes, uncomplicated: Secondary | ICD-10-CM | POA: Insufficient documentation

## 2017-12-21 DIAGNOSIS — R0600 Dyspnea, unspecified: Secondary | ICD-10-CM | POA: Diagnosis not present

## 2017-12-21 DIAGNOSIS — R0609 Other forms of dyspnea: Secondary | ICD-10-CM | POA: Diagnosis not present

## 2017-12-21 DIAGNOSIS — R0602 Shortness of breath: Secondary | ICD-10-CM | POA: Diagnosis not present

## 2017-12-21 DIAGNOSIS — R069 Unspecified abnormalities of breathing: Secondary | ICD-10-CM | POA: Diagnosis not present

## 2017-12-21 DIAGNOSIS — M7989 Other specified soft tissue disorders: Secondary | ICD-10-CM | POA: Diagnosis not present

## 2017-12-21 DIAGNOSIS — R531 Weakness: Secondary | ICD-10-CM | POA: Diagnosis not present

## 2017-12-21 LAB — COMPREHENSIVE METABOLIC PANEL
ALBUMIN: 3.7 g/dL (ref 3.5–5.0)
ALT: 10 U/L — AB (ref 17–63)
AST: 20 U/L (ref 15–41)
Alkaline Phosphatase: 87 U/L (ref 38–126)
Anion gap: 9 (ref 5–15)
BUN: 20 mg/dL (ref 6–20)
CHLORIDE: 105 mmol/L (ref 101–111)
CO2: 26 mmol/L (ref 22–32)
CREATININE: 1.1 mg/dL (ref 0.61–1.24)
Calcium: 8.7 mg/dL — ABNORMAL LOW (ref 8.9–10.3)
GFR calc Af Amer: >60 mL/min (ref >60)
GFR calc non Af Amer: >60 mL/min (ref >60)
GLUCOSE: 95 mg/dL (ref 65–99)
POTASSIUM: 3.8 mmol/L (ref 3.5–5.1)
Sodium: 140 mmol/L (ref 135–145)
Total Bilirubin: 1.1 mg/dL (ref 0.3–1.2)
Total Protein: 6.6 g/dL (ref 6.5–8.1)

## 2017-12-21 LAB — CBC WITH DIFFERENTIAL/PLATELET
BASOS PCT: 1 %
Basophils Absolute: 0 10*3/uL (ref 0.0–0.1)
EOS PCT: 3 %
Eosinophils Absolute: 0.2 10*3/uL (ref 0.0–0.7)
HCT: 42.9 % (ref 39.0–52.0)
Hemoglobin: 14.6 g/dL (ref 13.0–17.0)
LYMPHS PCT: 27 %
Lymphs Abs: 2.1 10*3/uL (ref 0.7–4.0)
MCH: 31.9 pg (ref 26.0–34.0)
MCHC: 34 g/dL (ref 30.0–36.0)
MCV: 93.9 fL (ref 78.0–100.0)
MONO ABS: 0.6 10*3/uL (ref 0.1–1.0)
Monocytes Relative: 8 %
NEUTROS ABS: 5 10*3/uL (ref 1.7–7.7)
Neutrophils Relative %: 63 %
PLATELETS: 179 10*3/uL (ref 150–400)
RBC: 4.57 MIL/uL (ref 4.22–5.81)
RDW: 13.1 % (ref 11.5–15.5)
WBC: 8 10*3/uL (ref 4.0–10.5)

## 2017-12-21 LAB — BLOOD GAS, ARTERIAL
ACID-BASE EXCESS: 1.1 mmol/L (ref 0.0–2.0)
BICARBONATE: 25.5 mmol/L (ref 20.0–28.0)
Drawn by: 317771
FIO2: 21
O2 Saturation: 93.5 %
PH ART: 7.434 (ref 7.350–7.450)
pCO2 arterial: 37.8 mmHg (ref 32.0–48.0)
pO2, Arterial: 72.1 mmHg — ABNORMAL LOW (ref 83.0–108.0)

## 2017-12-21 LAB — TROPONIN I: Troponin I: <0.03 ng/mL (ref <0.03)

## 2017-12-21 LAB — BRAIN NATRIURETIC PEPTIDE: B Natriuretic Peptide: 64 pg/mL (ref 0.0–100.0)

## 2017-12-21 MED ORDER — ALBUTEROL SULFATE (2.5 MG/3ML) 0.083% IN NEBU
5.0000 mg | INHALATION_SOLUTION | Freq: Once | RESPIRATORY_TRACT | Status: AC
  Administered 2017-12-21: 5 mg via RESPIRATORY_TRACT
  Filled 2017-12-21: qty 6

## 2017-12-21 NOTE — ED Triage Notes (Signed)
Pt arrived to ED via ems for evaluation of shortness of breath since yesterday, inability to lay flat, and 3+ pitting edema to lower extremities.

## 2017-12-21 NOTE — ED Notes (Signed)
Pt unable to sign signature pad due to Parkinson's. Pt verbalized understanding of d/c instructions and all questions answered.

## 2017-12-21 NOTE — Discharge Instructions (Addendum)
Please use your albuterol inhaler up to every 4 hours.  Return if you have worsening time.  Recheck with your doctor early next week

## 2017-12-21 NOTE — ED Provider Notes (Addendum)
[facility] EMERGENCY DEPARTMENT Provider Note   CSN: [id number] Arrival date & time: 12/21/17  1533     History   Chief Complaint Chief Complaint  Patient presents with  . Shortness of Breath    HPI Lucas [name] is a 72 y.o. male.  HPI  72 yo male ho cva, parkinson's , copd, smoker presents with increasing dyspnea over one week with worsening dyspnea and weakness for two days.  Patient states increased sob with exertion and lying flat.  Increased leg swelling bilaterally, no ho dvt or pe.  He used inhaler once two days ago but now can't find it.  Sister lives above you and someone comes in.  Patient only had gingerale today due to poor appetite.   pmd Dr. Meda Coffee Past Medical History:  Diagnosis Date  . Acute CVA (cerebrovascular accident) ([place]) 06/06/2015  . Acute ischemic stroke ([place]) 01/19/2015   Left midbrain/thalamus.  . Alcohol use   . Anxiety   . Cataract   . Cerebral ventriculomegaly 01/20/2015  . COPD (chronic obstructive pulmonary disease) ([place])   . Depression   . Depression with anxiety   . Hyperlipidemia   . Hypertension   . Neuromuscular disorder ([place])   . Parkinson's disease ([place])   . Substance abuse ([place])   . Tobacco abuse     Patient Active Problem List   Diagnosis Date Noted  . Stroke ([place]) 01/25/2017  . Essential hypertension 06/06/2015  . Hyperlipidemia 06/06/2015  . COPD (chronic obstructive pulmonary disease) ([place]) 06/06/2015  . Multiple falls 06/05/2015  . Cerebral ventriculomegaly 01/20/2015  . Facial droop 01/19/2015  . Tobacco abuse 01/19/2015  . Obesity 01/19/2015  . Depression with anxiety 01/19/2015  . Hyperglycemia 01/19/2015  . Dysarthria 01/15/2015  . Parkinson's disease [facility])     Past Surgical History:  Procedure Laterality Date  . EYE SURGERY    . SHOULDER SURGERY          Home Medications    Prior to Admission medications   Medication Sig Start Date End Date Taking? Authorizing Provider  albuterol (PROVENTIL  HFA;VENTOLIN HFA) 108 (90 Base) MCG/ACT inhaler Inhale 1 puff into the lungs every 4 (four) hours as needed for wheezing or shortness of breath.    [provider]  aspirin EC 81 MG EC tablet Take 1 tablet (81 mg total) by mouth daily. 01/16/15   Isaac Bliss, [name], MD  atorvastatin (LIPITOR) 10 MG tablet Take 10 mg by mouth every evening.  04/27/15   [provider]  Carbidopa-Levodopa ER (RYTARY) 36.25-145 MG CPCR Take 1 capsule by mouth 3 (three) times daily.     [provider]  clonazePAM (KLONOPIN) 1 MG tablet Take 1 mg by mouth 2 (two) times daily. 05/08/16   [provider]  furosemide (LASIX) 40 MG tablet Take 1 tablet (40 mg total) by mouth daily. 10/07/17   Raylene Everts, MD  gabapentin (NEURONTIN) 300 MG capsule Take 300 mg by mouth 3 (three) times daily.    [provider]  lisinopril (PRINIVIL,ZESTRIL) 5 MG tablet Take 1 tablet (5 mg total) by mouth daily. 01/25/17   Raylene Everts, MD  nystatin-triamcinolone ointment [facility]) Apply 1 application topically 2 (two) times daily. 11/03/17   Orlie Dakin, MD  omeprazole (PRILOSEC) 20 MG capsule Take 1 capsule (20 mg total) by mouth daily. 10/07/17   Raylene Everts, MD  PARoxetine (PAXIL) 40 MG tablet Take 60 mg by mouth every morning.     [provider]  rOPINIRole (REQUIP) 3 MG tablet Take 4.5 mg by mouth every morning.     [provider]  tadalafil (CIALIS) 10 MG tablet TAKE 1 TABLET ORALLY AS NEEDED 10/03/17   [provider]    Family History Family History  Problem Relation Age of Onset  . Cancer Father        lung  . Cancer Brother        lung  . Early death Brother        suicide  . Early death Brother        mva  . COPD Sister   . Diabetes Sister   . Heart disease Sister   . Early death Sister        fire    Social History Social History   Tobacco Use  . Smoking status: Current Every Day Smoker    Packs/day: 1.00    Types:  Cigarettes  . Smokeless tobacco: Never Used  Substance Use Topics  . Alcohol use: No    Comment: former  . Drug use: No     Allergies   Patient has no known allergies.   Review of Systems Review of Systems  Constitutional: Positive for activity change, appetite change and fatigue. Negative for chills and fever.  HENT: Negative for congestion and dental problem.   Eyes: Negative.   Respiratory: Positive for cough and shortness of breath.   Cardiovascular: Positive for leg swelling. Negative for chest pain.  Gastrointestinal: Negative for abdominal pain, diarrhea and vomiting.  Endocrine: Negative.   Genitourinary: Negative.   Musculoskeletal: Negative.   Allergic/Immunologic: Negative.   Neurological: Positive for weakness.  Psychiatric/Behavioral: Negative.      Physical Exam Updated Vital Signs BP 134/68 (BP Location: Left Arm)   Pulse 62   Temp 97.9 F (36.6 C) (Oral)   Resp (!) 24   Ht 1.702 m (5\' 7" )   Wt 90.7 kg (200 lb)   SpO2 95%   BMI 31.32 kg/m   Physical Exam  Constitutional: He is oriented to person, place, and time. He appears well-developed and well-nourished.  HENT:  Head: Normocephalic and atraumatic.  Mouth/Throat: Oropharynx is clear and moist.  Eyes: Pupils are equal, round, and reactive to light. EOM are normal.  Neck: Normal range of motion. Neck supple.  Cardiovascular: Normal rate, regular rhythm and normal heart sounds.  Pulmonary/Chest: Effort normal. He has wheezes in the right lower field and the left lower field.  Abdominal: Soft. Bowel sounds are normal.  Musculoskeletal: Normal range of motion.  Neurological: He is alert and oriented to person, place, and time.  Skin: Skin is warm and dry. Capillary refill takes less than 2 seconds.  Psychiatric: He has a normal mood and affect.  Nursing note and vitals reviewed.    ED Treatments / Results  Labs (all labs ordered are listed, but only abnormal results are displayed) Labs  Reviewed - No data to display  EKG EKG Interpretation  Date/Time:  Saturday December 21 2017 15:44:17 EDT Ventricular Rate:  61 PR Interval:    QRS Duration: 84 QT Interval:  418 QTC Calculation: 421 R Axis:   44 Text Interpretation:  Sinus rhythm Non-specific ST-t changes Poor data quality rhythm today is nsr compared to atrial flutter 18 sept 2017 Confirmed by Pattricia Boss [phone]) on 12/21/2017 3:59:20 PM   Radiology No results found.  Procedures Procedures (including critical care time)  Medications Ordered in ED Medications - No data to display  Initial Impression / Assessment and Plan / ED Course  I have reviewed the triage vital signs and the nursing notes.  Pertinent labs & imaging results that were available during my care of the patient were reviewed by me and considered in my medical decision making (see chart for details).  Clinical Course as of Dec 22 1802  Sat Dec 21, 2017  1802 Reviewed CMT hypocalcemia noted  Comprehensive metabolic panel(!) [DR]  0347 Troponin I [DR]  4259 CBC with Differential/Platelet [DR]  5638 Blood gas, arterial ([facility] & [facility] ONLY)(!) [DR]    Clinical Course User Index [DR] Pattricia Boss, MD    Patient appears stable from prior exams.  Feels improved here after one albuterol nebulizer.  He was unable to find his inhaler at home.  He wishes to be discharged home.  I feel he is stable for discharge.  His son is in the room with him and will take him home.  We discussed return precautions and need for close follow-up and he voices understanding.  Final Clinical Impressions(s) / ED Diagnoses   Final diagnoses:  Dyspnea, unspecified type    ED Discharge Orders    None       Pattricia Boss, MD 12/21/17 1806    Pattricia Boss, MD 12/21/17 [phone]

## 2017-12-21 NOTE — ED Notes (Signed)
Pt states he does not feel well enough to ambulate at this time.

## 2017-12-21 NOTE — ED Notes (Signed)
EDP at bedside  

## 2017-12-24 ENCOUNTER — Ambulatory Visit: Payer: Medicare HMO | Admitting: Vascular Surgery

## 2018-01-06 ENCOUNTER — Ambulatory Visit: Payer: Medicare HMO | Admitting: Family Medicine

## 2018-01-27 ENCOUNTER — Other Ambulatory Visit: Payer: Self-pay | Admitting: Family Medicine

## 2018-02-01 ENCOUNTER — Other Ambulatory Visit: Payer: Self-pay | Admitting: Family Medicine

## 2018-02-07 ENCOUNTER — Other Ambulatory Visit: Payer: Self-pay | Admitting: Family Medicine

## 2018-03-11 DIAGNOSIS — R251 Tremor, unspecified: Secondary | ICD-10-CM | POA: Diagnosis not present

## 2018-03-11 DIAGNOSIS — R2689 Other abnormalities of gait and mobility: Secondary | ICD-10-CM | POA: Diagnosis not present

## 2018-03-11 DIAGNOSIS — R69 Illness, unspecified: Secondary | ICD-10-CM | POA: Diagnosis not present

## 2018-03-11 DIAGNOSIS — I679 Cerebrovascular disease, unspecified: Secondary | ICD-10-CM | POA: Diagnosis not present

## 2018-03-12 DIAGNOSIS — J069 Acute upper respiratory infection, unspecified: Secondary | ICD-10-CM | POA: Diagnosis not present

## 2018-03-12 DIAGNOSIS — I10 Essential (primary) hypertension: Secondary | ICD-10-CM | POA: Diagnosis not present

## 2018-03-12 DIAGNOSIS — E559 Vitamin D deficiency, unspecified: Secondary | ICD-10-CM | POA: Diagnosis not present

## 2018-03-12 DIAGNOSIS — R69 Illness, unspecified: Secondary | ICD-10-CM | POA: Diagnosis not present

## 2018-03-12 DIAGNOSIS — E785 Hyperlipidemia, unspecified: Secondary | ICD-10-CM | POA: Diagnosis not present

## 2018-03-12 DIAGNOSIS — M545 Low back pain: Secondary | ICD-10-CM | POA: Diagnosis not present

## 2018-03-12 DIAGNOSIS — R12 Heartburn: Secondary | ICD-10-CM | POA: Diagnosis not present

## 2018-03-12 DIAGNOSIS — G20 Parkinson's disease: Secondary | ICD-10-CM | POA: Diagnosis not present

## 2018-05-06 DIAGNOSIS — I10 Essential (primary) hypertension: Secondary | ICD-10-CM | POA: Diagnosis not present

## 2018-05-06 DIAGNOSIS — R69 Illness, unspecified: Secondary | ICD-10-CM | POA: Diagnosis not present

## 2018-05-06 DIAGNOSIS — Z803 Family history of malignant neoplasm of breast: Secondary | ICD-10-CM | POA: Diagnosis not present

## 2018-05-06 DIAGNOSIS — E785 Hyperlipidemia, unspecified: Secondary | ICD-10-CM | POA: Diagnosis not present

## 2018-05-06 DIAGNOSIS — R609 Edema, unspecified: Secondary | ICD-10-CM | POA: Diagnosis not present

## 2018-05-06 DIAGNOSIS — E669 Obesity, unspecified: Secondary | ICD-10-CM | POA: Diagnosis not present

## 2018-05-06 DIAGNOSIS — G20 Parkinson's disease: Secondary | ICD-10-CM | POA: Diagnosis not present

## 2018-05-06 DIAGNOSIS — I739 Peripheral vascular disease, unspecified: Secondary | ICD-10-CM | POA: Diagnosis not present

## 2018-06-03 DIAGNOSIS — M545 Low back pain: Secondary | ICD-10-CM | POA: Diagnosis not present

## 2018-06-03 DIAGNOSIS — I10 Essential (primary) hypertension: Secondary | ICD-10-CM | POA: Diagnosis not present

## 2018-06-03 DIAGNOSIS — E785 Hyperlipidemia, unspecified: Secondary | ICD-10-CM | POA: Diagnosis not present

## 2018-06-03 DIAGNOSIS — R062 Wheezing: Secondary | ICD-10-CM | POA: Diagnosis not present

## 2018-06-03 DIAGNOSIS — R12 Heartburn: Secondary | ICD-10-CM | POA: Diagnosis not present

## 2018-06-03 DIAGNOSIS — R69 Illness, unspecified: Secondary | ICD-10-CM | POA: Diagnosis not present

## 2018-06-03 DIAGNOSIS — G20 Parkinson's disease: Secondary | ICD-10-CM | POA: Diagnosis not present

## 2018-06-03 DIAGNOSIS — R0602 Shortness of breath: Secondary | ICD-10-CM | POA: Diagnosis not present

## 2018-06-04 DIAGNOSIS — R69 Illness, unspecified: Secondary | ICD-10-CM | POA: Diagnosis not present

## 2018-06-05 DIAGNOSIS — I6789 Other cerebrovascular disease: Secondary | ICD-10-CM | POA: Diagnosis not present

## 2018-06-05 DIAGNOSIS — G20 Parkinson's disease: Secondary | ICD-10-CM | POA: Diagnosis not present

## 2018-07-01 DIAGNOSIS — M25571 Pain in right ankle and joints of right foot: Secondary | ICD-10-CM | POA: Diagnosis not present

## 2018-07-01 DIAGNOSIS — M1711 Unilateral primary osteoarthritis, right knee: Secondary | ICD-10-CM | POA: Diagnosis not present

## 2018-07-01 DIAGNOSIS — M1712 Unilateral primary osteoarthritis, left knee: Secondary | ICD-10-CM | POA: Diagnosis not present

## 2018-07-01 DIAGNOSIS — M25572 Pain in left ankle and joints of left foot: Secondary | ICD-10-CM | POA: Diagnosis not present

## 2018-07-05 IMAGING — CT CT ABD-PELV W/ CM
2 of 5 series · 15 of 46 positions shown, 17 images · IV contrast (Isovue)
Comparison: None.

CLINICAL DATA: The patient has been having abdominal pain,
especially on the right, for 10 days. No nausea, vomiting, or
diarrhea.

EXAM:
CT ABDOMEN AND PELVIS WITH CONTRAST
TECHNIQUE: Multidetector CT imaging of the abdomen and pelvis was performed
using the standard protocol following bolus administration of
intravenous contrast.
CONTRAST:  100mL B1KLT3-4MM IOPAMIDOL (B1KLT3-4MM) INJECTION 61%

[Series 2: axial st · axial · 0.78mm/px · z∈[+686,+1101]mm · 12 of 93 slices shown, 14 images]
[im 5/93  soft-tissue]
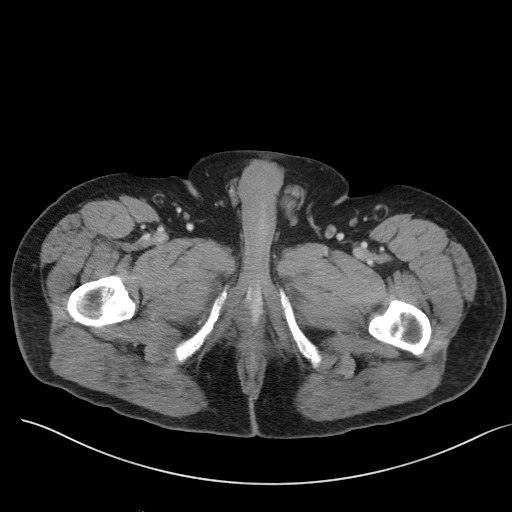
[im 5/93  bone]
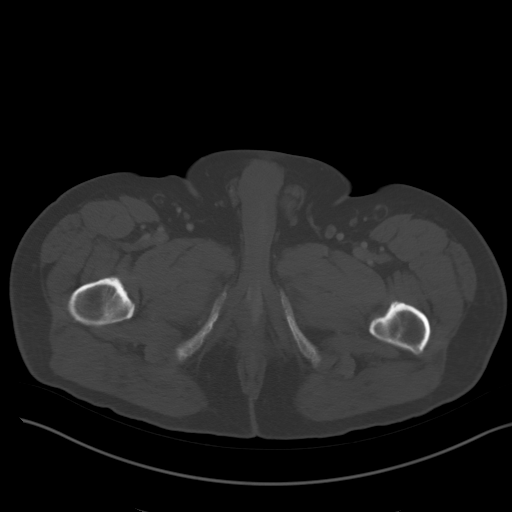
[im 14/93  soft-tissue]
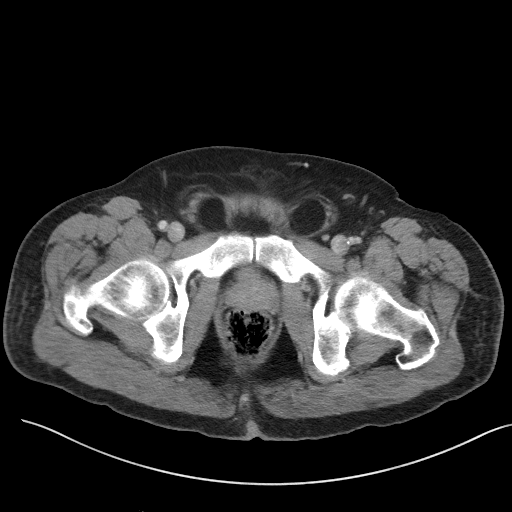
[im 19/93  soft-tissue]
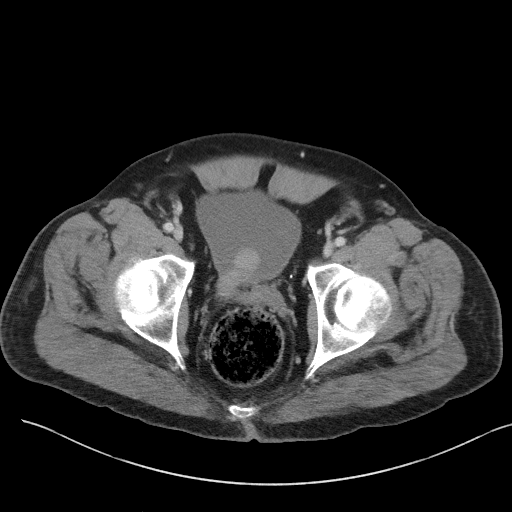
[im 28/93  soft-tissue]
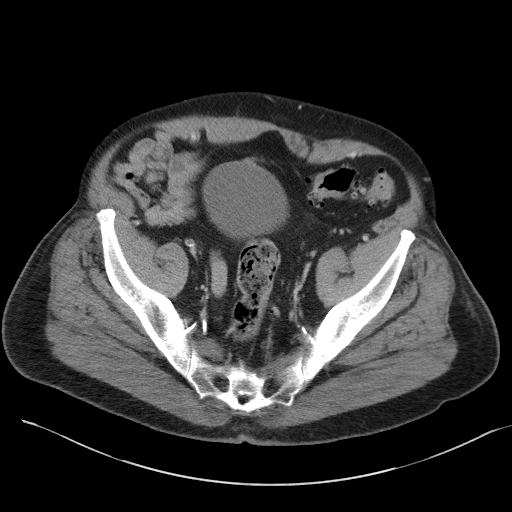
[im 37/93  soft-tissue]
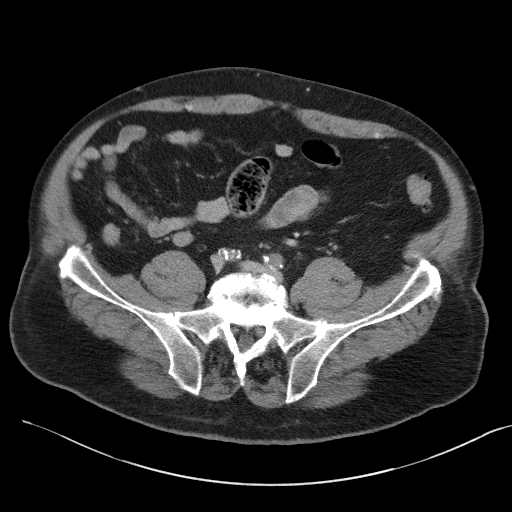
[im 42/93  soft-tissue]
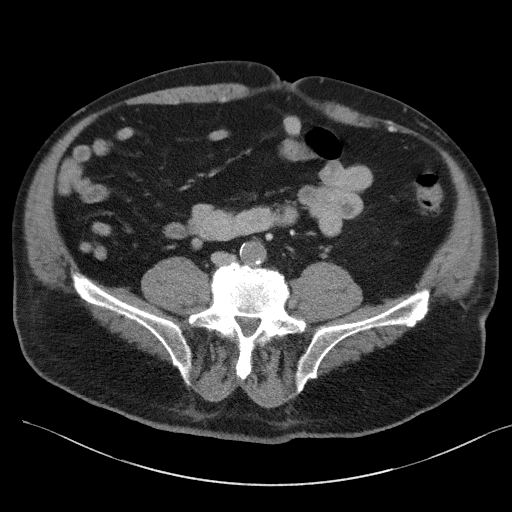
[im 51/93  soft-tissue]
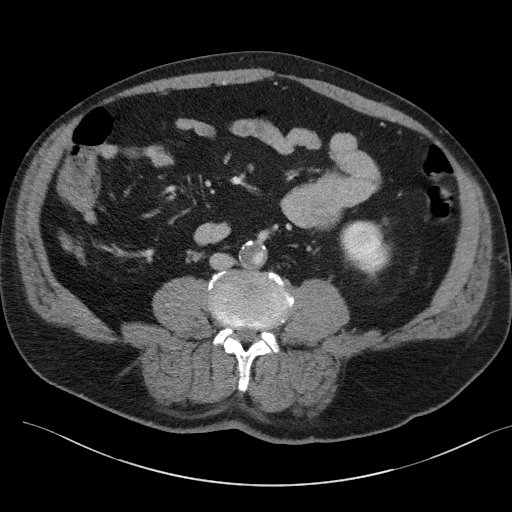
[im 56/93  soft-tissue]
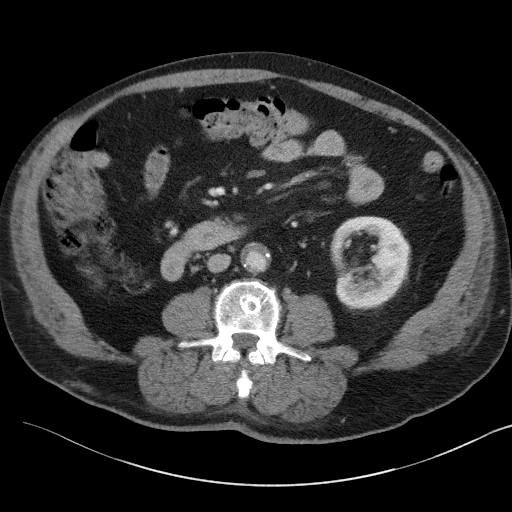
[im 65/93  soft-tissue]
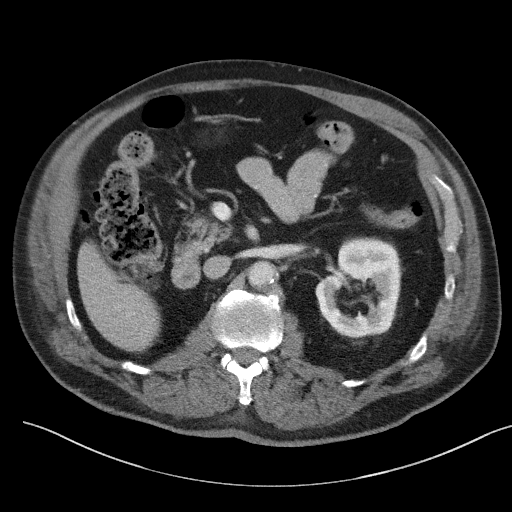
[im 65/93  bone]
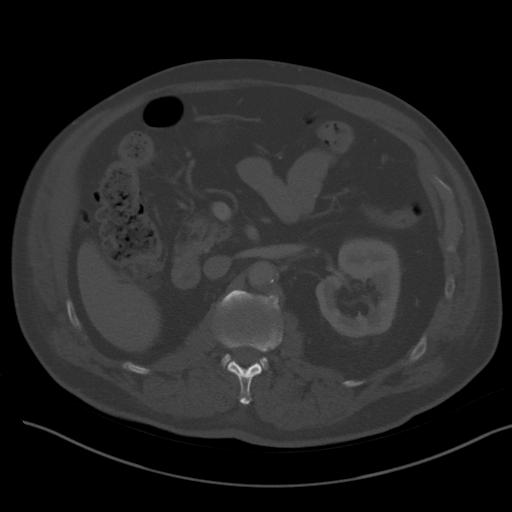
[im 74/93  soft-tissue]
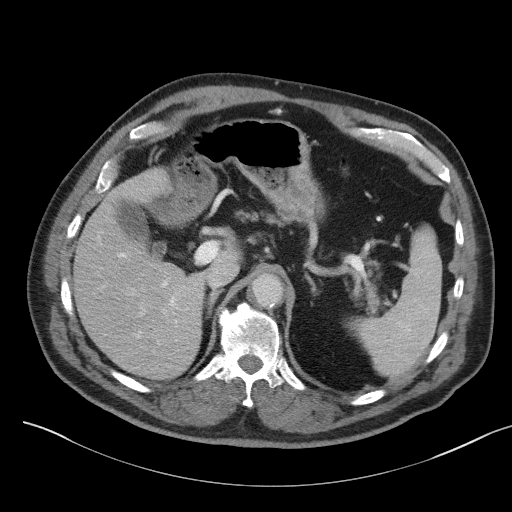
[im 79/93  soft-tissue]
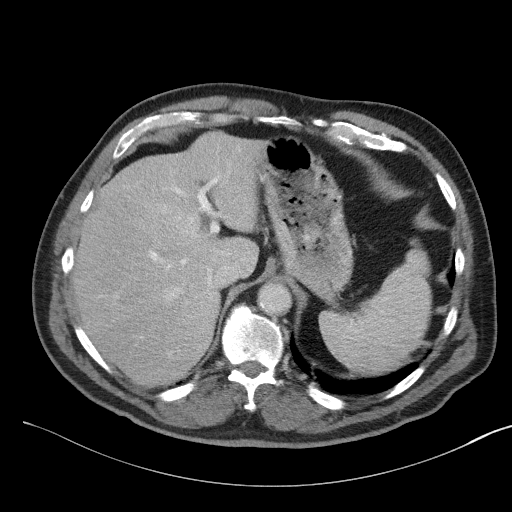
[im 88/93  soft-tissue]
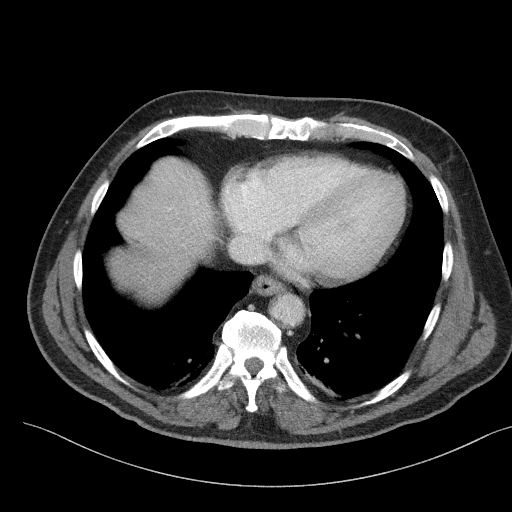

[Series 5: coronal st · coronal · 0.81mm/px · 3 of 100 slices shown]
[im 34/100  soft-tissue]
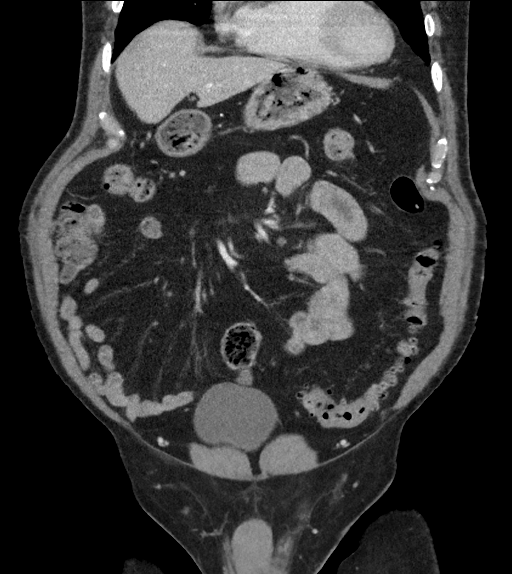
[im 45/100  soft-tissue]
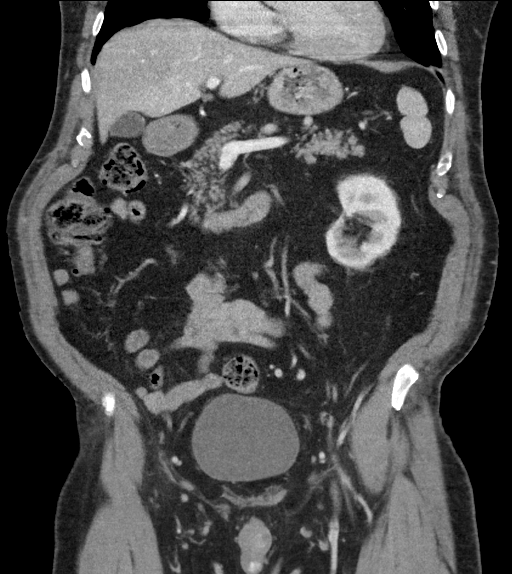
[im 56/100  soft-tissue]
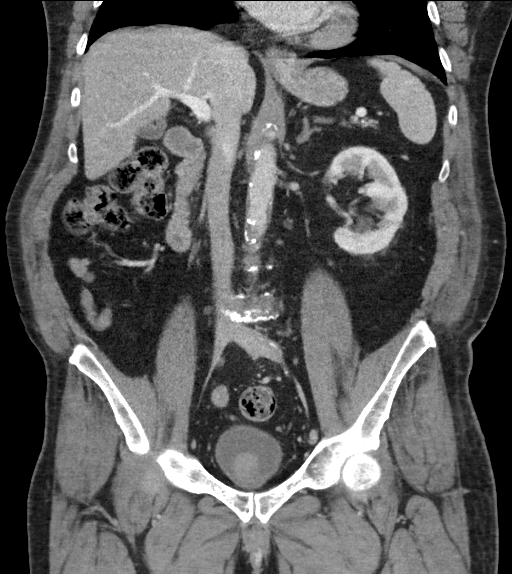

[15 of 46 positions shown; findings below may reference images not displayed]

FINDINGS: Lower chest: No acute abnormality.

Hepatobiliary: No focal liver abnormality is seen. No gallstones,
gallbladder wall thickening, or biliary dilatation.

Pancreas: Unremarkable. No pancreatic ductal dilatation or
surrounding inflammatory changes.

Spleen: Normal in size without focal abnormality.

Adrenals/Urinary Tract: The adrenal glands are normal. The right
kidney is not visualized, potentially congenitally absent. The left
kidney is normal in appearance. The left ureter is normal in caliber
with no stones. There is a filling defect in the bladder as seen on
series 2, image 76 and sagittal image 69. This defect is contiguous
with an enlarged right gonadal vein. The remainder of the bladder is
normal.

Stomach/Bowel: The stomach and small bowel are normal.
Diverticulosis is seen in the colon without diverticulitis. The
remainder of the colon is normal. The appendix is unremarkable.

Vascular/Lymphatic: Atherosclerosis is seen in the nonaneurysmal
aorta. The abdominal aorta is completely opacified from just after
the IMA takeoff on axial image 43 through the bifurcation. The
common iliac arteries are completely opacified and very small in
caliber as well. The distal external iliac arteries reconstitute via
enlarged collaterals. The IMA is also enlarged. The femoral arteries
are well opacified. No adenopathy.

Reproductive: Absence of the right seminal vesicle. The prostate and
left seminal vesicle are normal. The filling defect in the bladder
is most likely associated with the enlarged/dilated gonadal vein
rather than enlargement of the prostate.

Other: Fat containing inguinal hernias identified.

Musculoskeletal: No acute or significant osseous findings.
IMPRESSION: 1. Complete opacification of the abdominal aorta just inferior to
the IMA takeoff. The opacification extends through the common iliac
arteries into a portion of the external iliac arteries. The more
distal external iliac arteries are reconstituted via collaterals.
Given these collaterals, the small caliber of the unopacified common
iliac arteries, and the enlarged caliber of the IMA, the
opacification of the aorta is favored to be nonacute. Recommend
clinical correlation.
2. Filling defect in the bladder contiguous with a dilated right
gonadal vein. The filling defect may represent a varix. A bladder
mass or impression on the bladder from an asymmetrically enlarged
prostate are considered less likely but not completely excluded.
Recommend urologic consultation and cystoscopy.
3. Congenital absence of the right kidney.
4. Atherosclerosis in the abdominal aorta.

## 2018-07-06 DIAGNOSIS — I6789 Other cerebrovascular disease: Secondary | ICD-10-CM | POA: Diagnosis not present

## 2018-07-06 DIAGNOSIS — G20 Parkinson's disease: Secondary | ICD-10-CM | POA: Diagnosis not present

## 2018-07-11 DIAGNOSIS — M549 Dorsalgia, unspecified: Secondary | ICD-10-CM | POA: Diagnosis not present

## 2018-07-11 DIAGNOSIS — M542 Cervicalgia: Secondary | ICD-10-CM | POA: Diagnosis not present

## 2018-07-11 DIAGNOSIS — M25529 Pain in unspecified elbow: Secondary | ICD-10-CM | POA: Diagnosis not present

## 2018-08-05 DIAGNOSIS — I6789 Other cerebrovascular disease: Secondary | ICD-10-CM | POA: Diagnosis not present

## 2018-08-05 DIAGNOSIS — G20 Parkinson's disease: Secondary | ICD-10-CM | POA: Diagnosis not present

## 2018-08-12 DIAGNOSIS — R69 Illness, unspecified: Secondary | ICD-10-CM | POA: Diagnosis not present

## 2018-08-12 DIAGNOSIS — G20 Parkinson's disease: Secondary | ICD-10-CM | POA: Diagnosis not present

## 2018-08-12 DIAGNOSIS — R0602 Shortness of breath: Secondary | ICD-10-CM | POA: Diagnosis not present

## 2018-08-12 DIAGNOSIS — E538 Deficiency of other specified B group vitamins: Secondary | ICD-10-CM | POA: Diagnosis not present

## 2018-08-12 DIAGNOSIS — R12 Heartburn: Secondary | ICD-10-CM | POA: Diagnosis not present

## 2018-08-12 DIAGNOSIS — I10 Essential (primary) hypertension: Secondary | ICD-10-CM | POA: Diagnosis not present

## 2018-08-12 DIAGNOSIS — R062 Wheezing: Secondary | ICD-10-CM | POA: Diagnosis not present

## 2018-08-12 DIAGNOSIS — R600 Localized edema: Secondary | ICD-10-CM | POA: Diagnosis not present

## 2018-08-12 DIAGNOSIS — M545 Low back pain: Secondary | ICD-10-CM | POA: Diagnosis not present

## 2018-08-15 DIAGNOSIS — R69 Illness, unspecified: Secondary | ICD-10-CM | POA: Diagnosis not present

## 2018-08-15 DIAGNOSIS — E538 Deficiency of other specified B group vitamins: Secondary | ICD-10-CM | POA: Diagnosis not present

## 2018-08-15 DIAGNOSIS — G20 Parkinson's disease: Secondary | ICD-10-CM | POA: Diagnosis not present

## 2018-08-15 DIAGNOSIS — M545 Low back pain: Secondary | ICD-10-CM | POA: Diagnosis not present

## 2018-08-15 DIAGNOSIS — E785 Hyperlipidemia, unspecified: Secondary | ICD-10-CM | POA: Diagnosis not present

## 2018-08-15 DIAGNOSIS — I10 Essential (primary) hypertension: Secondary | ICD-10-CM | POA: Diagnosis not present

## 2018-08-15 DIAGNOSIS — Z9181 History of falling: Secondary | ICD-10-CM | POA: Diagnosis not present

## 2018-11-07 ENCOUNTER — Other Ambulatory Visit (HOSPITAL_COMMUNITY): Payer: Self-pay | Admitting: Family Medicine

## 2018-11-07 ENCOUNTER — Other Ambulatory Visit: Payer: Self-pay | Admitting: Family Medicine

## 2018-11-07 DIAGNOSIS — R200 Anesthesia of skin: Secondary | ICD-10-CM

## 2018-11-11 ENCOUNTER — Other Ambulatory Visit: Payer: Self-pay

## 2018-11-11 ENCOUNTER — Ambulatory Visit (HOSPITAL_COMMUNITY)
Admission: RE | Admit: 2018-11-11 | Discharge: 2018-11-11 | Disposition: A | Discharge status: Home / Self Care | Payer: Medicare HMO | Source: Ambulatory Visit | Attending: Family Medicine | Admitting: Family Medicine

## 2018-11-11 DIAGNOSIS — R200 Anesthesia of skin: Secondary | ICD-10-CM | POA: Diagnosis not present

## 2019-12-21 ENCOUNTER — Emergency Department (HOSPITAL_COMMUNITY): Payer: Medicare HMO

## 2019-12-21 ENCOUNTER — Encounter (HOSPITAL_COMMUNITY): Payer: Self-pay | Admitting: *Deleted

## 2019-12-21 ENCOUNTER — Emergency Department (HOSPITAL_COMMUNITY)
Admission: EM | Admit: 2019-12-21 | Discharge: 2019-12-21 | Disposition: A | Discharge status: Home / Self Care | Payer: Medicare HMO | Attending: Emergency Medicine | Admitting: Emergency Medicine

## 2019-12-21 DIAGNOSIS — Z20822 Contact with and (suspected) exposure to covid-19: Secondary | ICD-10-CM | POA: Insufficient documentation

## 2019-12-21 DIAGNOSIS — I5030 Unspecified diastolic (congestive) heart failure: Secondary | ICD-10-CM | POA: Insufficient documentation

## 2019-12-21 DIAGNOSIS — R600 Localized edema: Secondary | ICD-10-CM

## 2019-12-21 DIAGNOSIS — I110 Hypertensive heart disease with heart failure: Secondary | ICD-10-CM | POA: Diagnosis not present

## 2019-12-21 DIAGNOSIS — W1839XA Other fall on same level, initial encounter: Secondary | ICD-10-CM | POA: Diagnosis not present

## 2019-12-21 DIAGNOSIS — F17210 Nicotine dependence, cigarettes, uncomplicated: Secondary | ICD-10-CM | POA: Diagnosis not present

## 2019-12-21 DIAGNOSIS — M7989 Other specified soft tissue disorders: Secondary | ICD-10-CM | POA: Diagnosis present

## 2019-12-21 DIAGNOSIS — W19XXXA Unspecified fall, initial encounter: Secondary | ICD-10-CM

## 2019-12-21 DIAGNOSIS — Z8673 Personal history of transient ischemic attack (TIA), and cerebral infarction without residual deficits: Secondary | ICD-10-CM | POA: Insufficient documentation

## 2019-12-21 DIAGNOSIS — G20 Parkinson's disease: Secondary | ICD-10-CM | POA: Insufficient documentation

## 2019-12-21 LAB — CBC WITH DIFFERENTIAL/PLATELET
Abs Immature Granulocytes: 0.02 10*3/uL (ref 0.00–0.07)
Basophils Absolute: 0.1 10*3/uL (ref 0.0–0.1)
Basophils Relative: 1 %
Eosinophils Absolute: 0.1 10*3/uL (ref 0.0–0.5)
Eosinophils Relative: 2 %
HCT: 42.3 % (ref 39.0–52.0)
Hemoglobin: 13.8 g/dL (ref 13.0–17.0)
Immature Granulocytes: 0 %
Lymphocytes Relative: 21 %
Lymphs Abs: 1.6 10*3/uL (ref 0.7–4.0)
MCH: 31.8 pg (ref 26.0–34.0)
MCHC: 32.6 g/dL (ref 30.0–36.0)
MCV: 97.5 fL (ref 80.0–100.0)
Monocytes Absolute: 0.6 10*3/uL (ref 0.1–1.0)
Monocytes Relative: 8 %
Neutro Abs: 5.2 10*3/uL (ref 1.7–7.7)
Neutrophils Relative %: 68 %
Platelets: 203 10*3/uL (ref 150–400)
RBC: 4.34 MIL/uL (ref 4.22–5.81)
RDW: 14.6 % (ref 11.5–15.5)
WBC: 7.7 10*3/uL (ref 4.0–10.5)
nRBC: 0 % (ref 0.0–0.2)

## 2019-12-21 LAB — COMPREHENSIVE METABOLIC PANEL
ALT: 26 U/L (ref 0–44)
AST: 19 U/L (ref 15–41)
Albumin: 3.7 g/dL (ref 3.5–5.0)
Alkaline Phosphatase: 101 U/L (ref 38–126)
Anion gap: 8 (ref 5–15)
BUN: 27 mg/dL — ABNORMAL HIGH (ref 8–23)
CO2: 25 mmol/L (ref 22–32)
Calcium: 8.5 mg/dL — ABNORMAL LOW (ref 8.9–10.3)
Chloride: 106 mmol/L (ref 98–111)
Creatinine, Ser: 1.03 mg/dL (ref 0.61–1.24)
GFR calc Af Amer: >60 mL/min (ref >60)
GFR calc non Af Amer: >60 mL/min (ref >60)
Glucose, Bld: 100 mg/dL — ABNORMAL HIGH (ref 70–99)
Potassium: 3.9 mmol/L (ref 3.5–5.1)
Sodium: 139 mmol/L (ref 135–145)
Total Bilirubin: 0.9 mg/dL (ref 0.3–1.2)
Total Protein: 6.8 g/dL (ref 6.5–8.1)

## 2019-12-21 LAB — BRAIN NATRIURETIC PEPTIDE: B Natriuretic Peptide: 62 pg/mL (ref 0.0–100.0)

## 2019-12-21 LAB — RESPIRATORY PANEL BY RT PCR (FLU A&B, COVID)
Influenza A by PCR: NEGATIVE
Influenza B by PCR: NEGATIVE
SARS Coronavirus 2 by RT PCR: NEGATIVE

## 2019-12-21 NOTE — ED Provider Notes (Signed)
Emergency Department Provider Note   I have reviewed the triage vital signs and the nursing notes.   HISTORY  Chief Complaint Edema   HPI Lucas [name] is a 74 y.o. male with prior history of stroke, alcohol use, COPD, and Parkinson's presents to the emergency department by EMS after fall at home today.  EMS report that they are out of the home frequently with falls which is not unusual.  Patient is able to stand and transfer with assistance from a wheelchair at baseline.  Today, the patient states that his home health aide had to leave and he was trying to move from his wheelchair to his recliner when he fell backwards.  He denies any head injury or loss of consciousness.  He did call EMS for lift assist and they noted that he had significant swelling in his legs which is new.  Patient has baseline wheezing.  He denies specific shortness of breath or chest pain.  The patient lives alone with family in the area and home health occasionally at his house.  He denies any fevers or chills.  No new medications.  Past Medical History:  Diagnosis Date  . Acute CVA (cerebrovascular accident) ([place]) 06/06/2015  . Acute ischemic stroke ([place]) 01/19/2015   Left midbrain/thalamus.  . Alcohol use   . Anxiety   . Cataract   . Cerebral ventriculomegaly 01/20/2015  . COPD (chronic obstructive pulmonary disease) ([place])   . Depression   . Depression with anxiety   . Hyperlipidemia   . Hypertension   . Neuromuscular disorder ([place])   . Parkinson's disease ([place])   . Substance abuse ([place])   . Tobacco abuse     Patient Active Problem List   Diagnosis Date Noted  . Stroke ([place]) 01/25/2017  . Essential hypertension 06/06/2015  . Hyperlipidemia 06/06/2015  . COPD (chronic obstructive pulmonary disease) ([place]) 06/06/2015  . Multiple falls 06/05/2015  . Cerebral ventriculomegaly 01/20/2015  . Facial droop 01/19/2015  . Tobacco abuse 01/19/2015  . Obesity 01/19/2015  . Depression with anxiety  01/19/2015  . Hyperglycemia 01/19/2015  . Dysarthria 01/15/2015  . Parkinson's disease [facility])     Past Surgical History:  Procedure Laterality Date  . EYE SURGERY    . SHOULDER SURGERY      Allergies Patient has no known allergies.  Family History  Problem Relation Age of Onset  . Cancer Father        lung  . Cancer Brother        lung  . Early death Brother        suicide  . Early death Brother        mva  . COPD Sister   . Diabetes Sister   . Heart disease Sister   . Early death Sister        fire    Social History Social History   Tobacco Use  . Smoking status: Current Every Day Smoker    Packs/day: 1.00    Types: Cigarettes  . Smokeless tobacco: Never Used  Substance Use Topics  . Alcohol use: No    Comment: former  . Drug use: No    Review of Systems  Constitutional: No fever/chills. Positive generalized weakness.  Eyes: No visual changes. ENT: No sore throat. Cardiovascular: Denies chest pain. Respiratory: Denies shortness of breath. Gastrointestinal: No abdominal pain.  No nausea, no vomiting.  No diarrhea.  No constipation. Genitourinary: Negative for dysuria. Musculoskeletal: Chronic back pain. Skin: Negative for rash. Neurological:  Negative for headaches, focal weakness or numbness.  10-point ROS otherwise negative.  ____________________________________________   PHYSICAL EXAM:  VITAL SIGNS: ED Triage Vitals  Enc Vitals Group     BP 12/21/19 1519 (!) 138/54     Pulse Rate 12/21/19 1519 61     Resp 12/21/19 1519 19     Temp 12/21/19 1519 98.9 F (37.2 C)     Temp Source 12/21/19 1519 Oral     SpO2 12/21/19 1519 97 %     Weight 12/21/19 1518 190 lb (86.2 kg)     Height 12/21/19 1518 5\' 7"  (1.702 m)   Constitutional: Alert and oriented. Somewhat somnolent at times but following commands and engaging in conversation.  Eyes: Conjunctivae are normal.  Head: Atraumatic. Nose: No congestion/rhinnorhea. Mouth/Throat: Mucous membranes  are moist.  Neck: No stridor. No c spine tenderness.  Cardiovascular: Normal rate, regular rhythm. Good peripheral circulation. Grossly normal heart sounds.   Respiratory: Normal respiratory effort.  No retractions. Lungs CTAB. Gastrointestinal: Soft and nontender. Positive distention.  Musculoskeletal: No lower extremity tenderness with 3+ pitting edema bilaterally. No gross deformities of extremities. Neurologic:  Normal speech and language. No gross focal neurologic deficits are appreciated.  Skin:  Skin is warm, dry and intact. No rash noted.   ____________________________________________   LABS (all labs ordered are listed, but only abnormal results are displayed)  Labs Reviewed  COMPREHENSIVE METABOLIC PANEL - Abnormal; Notable for the following components:      Result Value   Glucose, Bld 100 (*)    BUN 27 (*)    Calcium 8.5 (*)    All other components within normal limits  RESPIRATORY PANEL BY RT PCR (FLU A&B, COVID)  BRAIN NATRIURETIC PEPTIDE  CBC WITH DIFFERENTIAL/PLATELET   ____________________________________________  EKG   EKG Interpretation  Date/Time:  Monday December 21 2019 16:21:31 EDT Ventricular Rate:  59 PR Interval:  188 QRS Duration: 72 QT Interval:  410 QTC Calculation: 405 R Axis:   63 Text Interpretation: Sinus bradycardia Otherwise normal ECG No STEMI Confirmed by Nanda Quinton [phone]) on 12/21/2019 4:35:32 PM       ____________________________________________  RADIOLOGY  DG Chest 2 View  Result Date: 12/21/2019 CLINICAL DATA:  Wheezing.  Bilateral lower extremity edema. EXAM: CHEST - 2 VIEW COMPARISON:  Chest radiograph 12/21/2017 FINDINGS: Stable cardiomediastinal contours. The lungs are clear. No focal consolidation. No pneumothorax or pleural effusion. No acute finding in the visualized skeleton. IMPRESSION: No active cardiopulmonary disease. Electronically Signed   By: Audie Pinto M.D.   On: 12/21/2019 18:36   CT Head Wo Contrast   Result Date: 12/21/2019 CLINICAL DATA:  Recent falls EXAM: CT HEAD WITHOUT CONTRAST TECHNIQUE: Contiguous axial images were obtained from the base of the skull through the vertex without intravenous contrast. COMPARISON:  MRI from 11/11/2018 FINDINGS: Brain: Ventricular prominence is again identified stable from the prior MRI examination. Mild atrophic changes are noted as well. A few scattered lacunar infarcts are noted within the region of the thalami bilaterally. Mild chronic white matter ischemic changes seen. No findings to suggest acute hemorrhage, acute infarction or space-occupying mass lesion are noted. Vascular: No hyperdense vessel or unexpected calcification. Skull: Normal. Negative for fracture or focal lesion. Sinuses/Orbits: Mucosal thickening is noted within the sphenoid sinus slightly increased when compared with the prior exam. Other: None. IMPRESSION: Stable ventricular prominence. Mild atrophic changes and chronic white matter ischemic change. No acute intracranial abnormality noted. Electronically Signed   By: Linus Mako.D.  On: 12/21/2019 17:28    ____________________________________________   PROCEDURES  Procedure(s) performed:   Procedures  None  ____________________________________________   INITIAL IMPRESSION / ASSESSMENT AND PLAN / ED COURSE  Pertinent labs & imaging results that were available during my care of the patient were reviewed by me and considered in my medical decision making (see chart for details).   Patient presents to the emergency department for evaluation of fall at home.  He arrives by EMS with new onset bilateral lower extremity swelling.  The last echo in our system is from [id number] showing diastolic CHF.  Patient does have Lasix and his medication list to supposed to take as needed for swelling.  Remaining medicines reviewed.  Plan for screening blood work, CT scan of the head with fall, chest x-ray, and reassess.   06:50 PM Lab work reviewed  with no acute findings.  Patient's BNP is within normal limits.  Chest x-ray clear and without pulmonary edema which correlates clinically.  CT head reviewed with no acute findings.  Plan to have the patient continue his Lasix along with compression of the lower extremities and elevation.  Discussed this plan with the patient's family by phone who will be up to take the patient home and will follow with the primary care doctor.    I reviewed all nursing notes, vitals, pertinent old records, EKGs, labs, imaging (as available).  ____________________________________________  FINAL CLINICAL IMPRESSION(S) / ED DIAGNOSES  Final diagnoses:  Fall, initial encounter  Bilateral lower extremity edema    Note:  This document was prepared using Dragon voice recognition software and may include unintentional dictation errors.  Nanda Quinton, MD, [facility] Emergency Medicine    Long, [place], MD 12/21/19 [phone]

## 2019-12-21 NOTE — Discharge Instructions (Signed)
You were seen in the emergency room today with lower extremity swelling and fall.  Your lab work, x-ray, CT scans look normal.  Please continue your Lasix and keep the legs elevated when at rest at home.  Please call your primary care doctor first thing tomorrow morning to schedule the next available follow-up appointment.

## 2019-12-21 NOTE — Progress Notes (Signed)
Patients next scheduled pcp visit is with [facility] on 12/23/2019.  [place] Transitions of Care  Clinical Social Worker  Ph: [phone]

## 2019-12-21 NOTE — ED Triage Notes (Signed)
Pt arrives from home via EMS. Pt has history of Parkinson's and EMS was called for for a lift assist/ per EMS pt has frequent falls. Pt was complaining of back pain at site. Per EMS pt is wheezing and this is normal for him, pt cannot bear weight which is normal for him. Pt has bilateral lower extremity edema which is not normal for him.

## 2019-12-21 NOTE — Progress Notes (Signed)
CSW at bedside to inquire about patients primary care. Patient states that he does have a pcp and his last visit was done virtually 2 weeks ago. Patients states that his primary care doctor is Dr. Earma Reading in [place].   CSW will add pcp to patients chart.

## 2019-12-21 NOTE — Progress Notes (Signed)
CSW has reviewed patients chart and notes that patient is without primary care and/or insurance. CSW will follow up with patient regarding this matter and provide assistance as needed.   Fransisco Messmer M. Chales Pelissier LCSWA Transitions of Care  Clinical Social Worker  Ph: [phone] 

## 2020-03-26 ENCOUNTER — Inpatient Hospital Stay (HOSPITAL_COMMUNITY)
Admission: EM | Admit: 2020-03-26 | Discharge: 2020-03-29 | DRG: 312 | Disposition: A | Discharge status: Home Health | Payer: Medicare HMO | Attending: Internal Medicine | Admitting: Internal Medicine

## 2020-03-26 DIAGNOSIS — E785 Hyperlipidemia, unspecified: Secondary | ICD-10-CM | POA: Diagnosis present

## 2020-03-26 DIAGNOSIS — Z825 Family history of asthma and other chronic lower respiratory diseases: Secondary | ICD-10-CM

## 2020-03-26 DIAGNOSIS — Z79899 Other long term (current) drug therapy: Secondary | ICD-10-CM

## 2020-03-26 DIAGNOSIS — R600 Localized edema: Secondary | ICD-10-CM | POA: Diagnosis present

## 2020-03-26 DIAGNOSIS — R451 Restlessness and agitation: Secondary | ICD-10-CM | POA: Diagnosis present

## 2020-03-26 DIAGNOSIS — Y900 Blood alcohol level of less than 20 mg/100 ml: Secondary | ICD-10-CM | POA: Diagnosis present

## 2020-03-26 DIAGNOSIS — T39395A Adverse effect of other nonsteroidal anti-inflammatory drugs [NSAID], initial encounter: Secondary | ICD-10-CM | POA: Diagnosis present

## 2020-03-26 DIAGNOSIS — I639 Cerebral infarction, unspecified: Secondary | ICD-10-CM | POA: Diagnosis present

## 2020-03-26 DIAGNOSIS — I708 Atherosclerosis of other arteries: Secondary | ICD-10-CM | POA: Diagnosis present

## 2020-03-26 DIAGNOSIS — Z833 Family history of diabetes mellitus: Secondary | ICD-10-CM

## 2020-03-26 DIAGNOSIS — G20A1 Parkinson's disease without dyskinesia, without mention of fluctuations: Secondary | ICD-10-CM | POA: Diagnosis present

## 2020-03-26 DIAGNOSIS — I952 Hypotension due to drugs: Secondary | ICD-10-CM | POA: Diagnosis not present

## 2020-03-26 DIAGNOSIS — R001 Bradycardia, unspecified: Secondary | ICD-10-CM | POA: Diagnosis present

## 2020-03-26 DIAGNOSIS — I82409 Acute embolism and thrombosis of unspecified deep veins of unspecified lower extremity: Secondary | ICD-10-CM

## 2020-03-26 DIAGNOSIS — Z7982 Long term (current) use of aspirin: Secondary | ICD-10-CM

## 2020-03-26 DIAGNOSIS — Z8249 Family history of ischemic heart disease and other diseases of the circulatory system: Secondary | ICD-10-CM

## 2020-03-26 DIAGNOSIS — F17210 Nicotine dependence, cigarettes, uncomplicated: Secondary | ICD-10-CM | POA: Diagnosis present

## 2020-03-26 DIAGNOSIS — T502X5A Adverse effect of carbonic-anhydrase inhibitors, benzothiadiazides and other diuretics, initial encounter: Secondary | ICD-10-CM | POA: Diagnosis present

## 2020-03-26 DIAGNOSIS — E869 Volume depletion, unspecified: Secondary | ICD-10-CM | POA: Diagnosis present

## 2020-03-26 DIAGNOSIS — Z9181 History of falling: Secondary | ICD-10-CM

## 2020-03-26 DIAGNOSIS — Z720 Tobacco use: Secondary | ICD-10-CM | POA: Diagnosis present

## 2020-03-26 DIAGNOSIS — G20 Parkinson's disease: Secondary | ICD-10-CM | POA: Diagnosis present

## 2020-03-26 DIAGNOSIS — M79605 Pain in left leg: Secondary | ICD-10-CM

## 2020-03-26 DIAGNOSIS — I959 Hypotension, unspecified: Secondary | ICD-10-CM | POA: Diagnosis present

## 2020-03-26 DIAGNOSIS — G2581 Restless legs syndrome: Secondary | ICD-10-CM

## 2020-03-26 DIAGNOSIS — I9589 Other hypotension: Secondary | ICD-10-CM | POA: Diagnosis present

## 2020-03-26 DIAGNOSIS — I10 Essential (primary) hypertension: Secondary | ICD-10-CM | POA: Diagnosis present

## 2020-03-26 DIAGNOSIS — F418 Other specified anxiety disorders: Secondary | ICD-10-CM | POA: Diagnosis present

## 2020-03-26 DIAGNOSIS — R296 Repeated falls: Secondary | ICD-10-CM

## 2020-03-26 DIAGNOSIS — J449 Chronic obstructive pulmonary disease, unspecified: Secondary | ICD-10-CM | POA: Diagnosis present

## 2020-03-26 DIAGNOSIS — N179 Acute kidney failure, unspecified: Secondary | ICD-10-CM

## 2020-03-26 DIAGNOSIS — F1010 Alcohol abuse, uncomplicated: Secondary | ICD-10-CM | POA: Diagnosis present

## 2020-03-26 DIAGNOSIS — Z20822 Contact with and (suspected) exposure to covid-19: Secondary | ICD-10-CM | POA: Diagnosis present

## 2020-03-26 DIAGNOSIS — L0390 Cellulitis, unspecified: Secondary | ICD-10-CM

## 2020-03-26 DIAGNOSIS — M79606 Pain in leg, unspecified: Secondary | ICD-10-CM

## 2020-03-26 DIAGNOSIS — Z809 Family history of malignant neoplasm, unspecified: Secondary | ICD-10-CM

## 2020-03-26 DIAGNOSIS — Z8673 Personal history of transient ischemic attack (TIA), and cerebral infarction without residual deficits: Secondary | ICD-10-CM

## 2020-03-26 MED ORDER — OXYCODONE-ACETAMINOPHEN 5-325 MG PO TABS
1.0000 | ORAL_TABLET | Freq: Once | ORAL | Status: AC
  Administered 2020-03-27: 1 via ORAL
  Filled 2020-03-26: qty 1

## 2020-03-26 NOTE — ED Triage Notes (Signed)
Pt. Brought in via EMS for swollen feet. Per EMS Pt. Was hypotensive during the ambulance ride to the hospital.

## 2020-03-27 ENCOUNTER — Other Ambulatory Visit: Payer: Self-pay

## 2020-03-27 ENCOUNTER — Inpatient Hospital Stay (HOSPITAL_COMMUNITY): Payer: Medicare HMO

## 2020-03-27 ENCOUNTER — Observation Stay (HOSPITAL_COMMUNITY): Payer: Medicare HMO

## 2020-03-27 ENCOUNTER — Other Ambulatory Visit (HOSPITAL_COMMUNITY): Payer: Self-pay | Admitting: Radiology

## 2020-03-27 ENCOUNTER — Emergency Department (HOSPITAL_COMMUNITY): Payer: Medicare HMO

## 2020-03-27 ENCOUNTER — Encounter (HOSPITAL_COMMUNITY): Payer: Self-pay | Admitting: Internal Medicine

## 2020-03-27 DIAGNOSIS — F17210 Nicotine dependence, cigarettes, uncomplicated: Secondary | ICD-10-CM | POA: Diagnosis present

## 2020-03-27 DIAGNOSIS — L03119 Cellulitis of unspecified part of limb: Secondary | ICD-10-CM

## 2020-03-27 DIAGNOSIS — I950 Idiopathic hypotension: Secondary | ICD-10-CM | POA: Diagnosis not present

## 2020-03-27 DIAGNOSIS — R451 Restlessness and agitation: Secondary | ICD-10-CM | POA: Diagnosis present

## 2020-03-27 DIAGNOSIS — Y900 Blood alcohol level of less than 20 mg/100 ml: Secondary | ICD-10-CM | POA: Diagnosis present

## 2020-03-27 DIAGNOSIS — R296 Repeated falls: Secondary | ICD-10-CM | POA: Diagnosis present

## 2020-03-27 DIAGNOSIS — R600 Localized edema: Secondary | ICD-10-CM | POA: Diagnosis present

## 2020-03-27 DIAGNOSIS — F418 Other specified anxiety disorders: Secondary | ICD-10-CM | POA: Diagnosis present

## 2020-03-27 DIAGNOSIS — Z825 Family history of asthma and other chronic lower respiratory diseases: Secondary | ICD-10-CM | POA: Diagnosis not present

## 2020-03-27 DIAGNOSIS — I5031 Acute diastolic (congestive) heart failure: Secondary | ICD-10-CM | POA: Diagnosis not present

## 2020-03-27 DIAGNOSIS — T39395A Adverse effect of other nonsteroidal anti-inflammatory drugs [NSAID], initial encounter: Secondary | ICD-10-CM | POA: Diagnosis present

## 2020-03-27 DIAGNOSIS — Z720 Tobacco use: Secondary | ICD-10-CM

## 2020-03-27 DIAGNOSIS — I639 Cerebral infarction, unspecified: Secondary | ICD-10-CM

## 2020-03-27 DIAGNOSIS — E869 Volume depletion, unspecified: Secondary | ICD-10-CM | POA: Diagnosis present

## 2020-03-27 DIAGNOSIS — T502X5A Adverse effect of carbonic-anhydrase inhibitors, benzothiadiazides and other diuretics, initial encounter: Secondary | ICD-10-CM | POA: Diagnosis present

## 2020-03-27 DIAGNOSIS — M79605 Pain in left leg: Secondary | ICD-10-CM

## 2020-03-27 DIAGNOSIS — J449 Chronic obstructive pulmonary disease, unspecified: Secondary | ICD-10-CM | POA: Diagnosis present

## 2020-03-27 DIAGNOSIS — G2581 Restless legs syndrome: Secondary | ICD-10-CM

## 2020-03-27 DIAGNOSIS — I708 Atherosclerosis of other arteries: Secondary | ICD-10-CM | POA: Diagnosis present

## 2020-03-27 DIAGNOSIS — Z20822 Contact with and (suspected) exposure to covid-19: Secondary | ICD-10-CM | POA: Diagnosis present

## 2020-03-27 DIAGNOSIS — E785 Hyperlipidemia, unspecified: Secondary | ICD-10-CM

## 2020-03-27 DIAGNOSIS — I959 Hypotension, unspecified: Secondary | ICD-10-CM

## 2020-03-27 DIAGNOSIS — G20 Parkinson's disease: Secondary | ICD-10-CM

## 2020-03-27 DIAGNOSIS — F1010 Alcohol abuse, uncomplicated: Secondary | ICD-10-CM | POA: Diagnosis present

## 2020-03-27 DIAGNOSIS — N179 Acute kidney failure, unspecified: Secondary | ICD-10-CM

## 2020-03-27 DIAGNOSIS — R001 Bradycardia, unspecified: Secondary | ICD-10-CM | POA: Diagnosis present

## 2020-03-27 DIAGNOSIS — Z8673 Personal history of transient ischemic attack (TIA), and cerebral infarction without residual deficits: Secondary | ICD-10-CM | POA: Diagnosis not present

## 2020-03-27 DIAGNOSIS — I9589 Other hypotension: Secondary | ICD-10-CM | POA: Diagnosis present

## 2020-03-27 DIAGNOSIS — I10 Essential (primary) hypertension: Secondary | ICD-10-CM | POA: Diagnosis present

## 2020-03-27 DIAGNOSIS — L0390 Cellulitis, unspecified: Secondary | ICD-10-CM

## 2020-03-27 DIAGNOSIS — Z809 Family history of malignant neoplasm, unspecified: Secondary | ICD-10-CM | POA: Diagnosis not present

## 2020-03-27 DIAGNOSIS — I952 Hypotension due to drugs: Secondary | ICD-10-CM | POA: Diagnosis present

## 2020-03-27 HISTORY — DX: Acute kidney failure, unspecified: N17.9

## 2020-03-27 LAB — RESPIRATORY PANEL BY RT PCR (FLU A&B, COVID)
Influenza A by PCR: NEGATIVE
Influenza B by PCR: NEGATIVE
SARS Coronavirus 2 by RT PCR: NEGATIVE

## 2020-03-27 LAB — PROTEIN / CREATININE RATIO, URINE
Creatinine, Urine: 80.77 mg/dL
Protein Creatinine Ratio: 0.02 mg/mg{Cre} (ref 0.00–0.15)
Total Protein, Urine: 2 mg/dL

## 2020-03-27 LAB — URINALYSIS, ROUTINE W REFLEX MICROSCOPIC
Bilirubin Urine: NEGATIVE
Glucose, UA: NEGATIVE mg/dL
Hgb urine dipstick: NEGATIVE
Ketones, ur: NEGATIVE mg/dL
Leukocytes,Ua: NEGATIVE
Nitrite: NEGATIVE
Protein, ur: NEGATIVE mg/dL
Specific Gravity, Urine: 1.035 — ABNORMAL HIGH (ref 1.005–1.030)
pH: 5 (ref 5.0–8.0)

## 2020-03-27 LAB — ETHANOL: Alcohol, Ethyl (B): <10 mg/dL (ref <10)

## 2020-03-27 LAB — RAPID URINE DRUG SCREEN, HOSP PERFORMED
Amphetamines: NOT DETECTED
Barbiturates: NOT DETECTED
Benzodiazepines: NOT DETECTED
Cocaine: NOT DETECTED
Opiates: NOT DETECTED
Tetrahydrocannabinol: NOT DETECTED

## 2020-03-27 LAB — BASIC METABOLIC PANEL
Anion gap: 8 (ref 5–15)
BUN: 33 mg/dL — ABNORMAL HIGH (ref 8–23)
CO2: 26 mmol/L (ref 22–32)
Calcium: 8.3 mg/dL — ABNORMAL LOW (ref 8.9–10.3)
Chloride: 107 mmol/L (ref 98–111)
Creatinine, Ser: 1.58 mg/dL — ABNORMAL HIGH (ref 0.61–1.24)
GFR calc Af Amer: 50 mL/min — ABNORMAL LOW (ref >60)
GFR calc non Af Amer: 43 mL/min — ABNORMAL LOW (ref >60)
Glucose, Bld: 116 mg/dL — ABNORMAL HIGH (ref 70–99)
Potassium: 3.6 mmol/L (ref 3.5–5.1)
Sodium: 141 mmol/L (ref 135–145)

## 2020-03-27 LAB — LACTIC ACID, PLASMA
Lactic Acid, Venous: 1.1 mmol/L (ref 0.5–1.9)
Lactic Acid, Venous: 1.3 mmol/L (ref 0.5–1.9)

## 2020-03-27 LAB — TSH: TSH: 1.479 u[IU]/mL (ref 0.350–4.500)

## 2020-03-27 LAB — CBC
HCT: 39.6 % (ref 39.0–52.0)
Hemoglobin: 13.3 g/dL (ref 13.0–17.0)
MCH: 32 pg (ref 26.0–34.0)
MCHC: 33.6 g/dL (ref 30.0–36.0)
MCV: 95.4 fL (ref 80.0–100.0)
Platelets: 190 10*3/uL (ref 150–400)
RBC: 4.15 MIL/uL — ABNORMAL LOW (ref 4.22–5.81)
RDW: 13.9 % (ref 11.5–15.5)
WBC: 7.8 10*3/uL (ref 4.0–10.5)
nRBC: 0 % (ref 0.0–0.2)

## 2020-03-27 LAB — HEPATIC FUNCTION PANEL
ALT: 8 U/L (ref 0–44)
AST: 14 U/L — ABNORMAL LOW (ref 15–41)
Albumin: 3.1 g/dL — ABNORMAL LOW (ref 3.5–5.0)
Alkaline Phosphatase: 64 U/L (ref 38–126)
Bilirubin, Direct: 0.1 mg/dL (ref 0.0–0.2)
Indirect Bilirubin: 0.8 mg/dL (ref 0.3–0.9)
Total Bilirubin: 0.9 mg/dL (ref 0.3–1.2)
Total Protein: 5.5 g/dL — ABNORMAL LOW (ref 6.5–8.1)

## 2020-03-27 LAB — MRSA PCR SCREENING: MRSA by PCR: NEGATIVE

## 2020-03-27 LAB — PROCALCITONIN: Procalcitonin: <0.1 ng/mL

## 2020-03-27 LAB — CORTISOL: Cortisol, Plasma: 9.7 ug/dL

## 2020-03-27 LAB — BRAIN NATRIURETIC PEPTIDE: B Natriuretic Peptide: 30 pg/mL (ref 0.0–100.0)

## 2020-03-27 LAB — D-DIMER, QUANTITATIVE: D-Dimer, Quant: 0.63 ug/mL-FEU — ABNORMAL HIGH (ref 0.00–0.50)

## 2020-03-27 MED ORDER — IPRATROPIUM-ALBUTEROL 0.5-2.5 (3) MG/3ML IN SOLN: 3.0000 mL | RESPIRATORY_TRACT | Status: DC | PRN

## 2020-03-27 MED ORDER — HYDROCORTISONE NA SUCCINATE PF 100 MG IJ SOLR
50.0000 mg | Freq: Three times a day (TID) | INTRAMUSCULAR | Status: DC
  Administered 2020-03-27 – 2020-03-28 (×3): 50 mg via INTRAVENOUS
  Filled 2020-03-27 (×3): qty 2

## 2020-03-27 MED ORDER — ATORVASTATIN CALCIUM 10 MG PO TABS
10.0000 mg | ORAL_TABLET | Freq: Every evening | ORAL | Status: DC
  Administered 2020-03-27: 10 mg via ORAL
  Filled 2020-03-27: qty 1

## 2020-03-27 MED ORDER — SODIUM CHLORIDE 0.9 % IV SOLN
1.0000 g | Freq: Once | INTRAVENOUS | Status: AC
  Administered 2020-03-27: 1 g via INTRAVENOUS
  Filled 2020-03-27: qty 10

## 2020-03-27 MED ORDER — SODIUM CHLORIDE 0.9 % IV SOLN: 250.0000 mL | INTRAVENOUS | Status: DC

## 2020-03-27 MED ORDER — HEPARIN SODIUM (PORCINE) 5000 UNIT/ML IJ SOLN
5000.0000 [IU] | Freq: Three times a day (TID) | INTRAMUSCULAR | Status: DC
  Administered 2020-03-27 – 2020-03-28 (×3): 5000 [IU] via SUBCUTANEOUS
  Filled 2020-03-27 (×4): qty 1

## 2020-03-27 MED ORDER — CHLORHEXIDINE GLUCONATE CLOTH 2 % EX PADS
6.0000 | MEDICATED_PAD | Freq: Every day | CUTANEOUS | Status: DC
  Administered 2020-03-27: 6 via TOPICAL

## 2020-03-27 MED ORDER — HYDROCORTISONE NA SUCCINATE PF 100 MG IJ SOLR
100.0000 mg | Freq: Once | INTRAMUSCULAR | Status: AC
  Administered 2020-03-27: 100 mg via INTRAVENOUS
  Filled 2020-03-27: qty 2

## 2020-03-27 MED ORDER — ALBUTEROL SULFATE (2.5 MG/3ML) 0.083% IN NEBU: 2.5000 mg | INHALATION_SOLUTION | RESPIRATORY_TRACT | Status: DC | PRN

## 2020-03-27 MED ORDER — SODIUM CHLORIDE 0.9 % IV BOLUS
1000.0000 mL | Freq: Once | INTRAVENOUS | Status: AC
  Administered 2020-03-27: 1000 mL via INTRAVENOUS

## 2020-03-27 MED ORDER — NICOTINE 21 MG/24HR TD PT24
21.0000 mg | MEDICATED_PATCH | Freq: Every day | TRANSDERMAL | Status: DC
  Administered 2020-03-27 – 2020-03-29 (×3): 21 mg via TRANSDERMAL
  Filled 2020-03-27 (×4): qty 1

## 2020-03-27 MED ORDER — LACTATED RINGERS IV SOLN: INTRAVENOUS | Status: DC

## 2020-03-27 MED ORDER — IPRATROPIUM-ALBUTEROL 0.5-2.5 (3) MG/3ML IN SOLN: 3.0000 mL | Freq: Four times a day (QID) | RESPIRATORY_TRACT | Status: DC

## 2020-03-27 MED ORDER — MELATONIN 3 MG PO TABS
3.0000 mg | ORAL_TABLET | Freq: Once | ORAL | Status: AC
  Administered 2020-03-27: 3 mg via ORAL
  Filled 2020-03-27: qty 1

## 2020-03-27 MED ORDER — SODIUM CHLORIDE 0.9 % IV SOLN
2.0000 g | Freq: Two times a day (BID) | INTRAVENOUS | Status: DC
  Administered 2020-03-27 – 2020-03-28 (×3): 2 g via INTRAVENOUS
  Filled 2020-03-27 (×4): qty 2

## 2020-03-27 MED ORDER — ACETAMINOPHEN 325 MG PO TABS
650.0000 mg | ORAL_TABLET | Freq: Four times a day (QID) | ORAL | Status: DC | PRN
  Administered 2020-03-27 – 2020-03-28 (×3): 650 mg via ORAL
  Filled 2020-03-27 (×3): qty 2

## 2020-03-27 MED ORDER — NOREPINEPHRINE 4 MG/250ML-% IV SOLN
2.0000 ug/min | INTRAVENOUS | Status: DC
  Administered 2020-03-27 (×2): 2 ug/min via INTRAVENOUS

## 2020-03-27 MED ORDER — ALBUTEROL SULFATE HFA 108 (90 BASE) MCG/ACT IN AERS: 1.0000 | INHALATION_SPRAY | RESPIRATORY_TRACT | Status: DC | PRN

## 2020-03-27 MED ORDER — SODIUM CHLORIDE 0.9 % IV SOLN: 2.0000 g | INTRAVENOUS | Status: DC

## 2020-03-27 MED ORDER — SODIUM CHLORIDE 0.9 % IV SOLN: Freq: Once | INTRAVENOUS | Status: AC

## 2020-03-27 MED ORDER — SODIUM CHLORIDE 0.9 % IV BOLUS
500.0000 mL | Freq: Once | INTRAVENOUS | Status: AC
  Administered 2020-03-27: 500 mL via INTRAVENOUS

## 2020-03-27 MED ORDER — IOHEXOL 350 MG/ML SOLN
80.0000 mL | Freq: Once | INTRAVENOUS | Status: AC | PRN
  Administered 2020-03-27: 80 mL via INTRAVENOUS

## 2020-03-27 MED ORDER — VANCOMYCIN HCL 1500 MG/300ML IV SOLN
1500.0000 mg | Freq: Once | INTRAVENOUS | Status: AC
  Administered 2020-03-27: 1500 mg via INTRAVENOUS
  Filled 2020-03-27: qty 300

## 2020-03-27 MED ORDER — IPRATROPIUM-ALBUTEROL 0.5-2.5 (3) MG/3ML IN SOLN
3.0000 mL | Freq: Four times a day (QID) | RESPIRATORY_TRACT | Status: DC
  Administered 2020-03-27 (×2): 3 mL via RESPIRATORY_TRACT
  Filled 2020-03-27 (×4): qty 3

## 2020-03-27 MED ORDER — ROPINIROLE HCL 1 MG PO TABS
3.0000 mg | ORAL_TABLET | Freq: Three times a day (TID) | ORAL | Status: DC
  Administered 2020-03-27 – 2020-03-29 (×3): 3 mg via ORAL
  Filled 2020-03-27 (×13): qty 3

## 2020-03-27 MED ORDER — HYDROCORTISONE NA SUCCINATE PF 100 MG IJ SOLR
50.0000 mg | Freq: Three times a day (TID) | INTRAMUSCULAR | Status: DC
  Filled 2020-03-27: qty 2

## 2020-03-27 MED ORDER — VANCOMYCIN HCL 750 MG/150ML IV SOLN
750.0000 mg | Freq: Two times a day (BID) | INTRAVENOUS | Status: DC
  Administered 2020-03-27 – 2020-03-28 (×2): 750 mg via INTRAVENOUS
  Filled 2020-03-27 (×4): qty 150

## 2020-03-27 NOTE — Progress Notes (Signed)
Pharmacy Antibiotic Note  Lucas [name] is a 74 y.o. male admitted on 03/26/2020 with sepsis.  Pharmacy has been consulted for Vancomycin and cefepime dosing.  Plan: Vancomycin 1500mg  IV loading dose, then 750mg  IV every 12 hours.  Goal trough 15-20 mcg/mL.  Cefepime 2gm IV q12h F/U cxs and clinical progress Monitor V/S, labs, and levels as indicated  Height: 5\' 8"  (172.7 cm) Weight: 90.7 kg (200 lb) IBW/kg (Calculated) : 68.4  Temp (24hrs), Avg:98.3 F (36.8 C), Min:98.3 F (36.8 C), Max:98.3 F (36.8 C)  Recent Labs  Lab 03/26/20 2354 03/27/20 0144  WBC 7.8  --   CREATININE 1.58*  --   LATICACIDVEN 1.3 1.1    Estimated Creatinine Clearance: 46.2 mL/min (A) (by C-G formula based on SCr of 1.58 mg/dL (H)).    No Known Allergies  Antimicrobials this admission: Vancomycin 8/1 >>  Cefepime 8/1>>  Ceftriaxone 1gm IV x 1 in ED 8/1  Dose adjustments this admission: Vanc/cefepime  Microbiology results: 8/1 BCx: pending 8/1 UCx: pending  MRSA PCR:   Thank you for allowing pharmacy to be a part of this patient's care.  Isac Sarna, BS Pharm D, [place] Clinical Pharmacist Pager [phone] 03/27/2020 8:50 AM

## 2020-03-27 NOTE — ED Provider Notes (Signed)
[facility] EMERGENCY DEPARTMENT Provider Note   CSN: [id number] Arrival date & time: 03/26/20  2218     History Chief Complaint  Patient presents with   Hypotension    Lucas [name] is a 74 y.o. male.  HPI     This is a 74 year old male with a history of stroke, alcohol use, COPD, hypertension, hyperlipidemia, Parkinson's who presents with leg pain.  Patient reports acute onset of left leg pain.  He called EMS.  EMS reports that he called out for swollen feet.  Patient reports that his feet have been swollen for "a long time."  He is reporting 10 out of 10 pain in the posterior aspect of the left leg.  Denies any injury.  Has not noted any chest pain or shortness of breath.  Per EMS was noted to be hypotensive in route.  Patient denies any fevers or other systemic symptoms.  Past Medical History:  Diagnosis Date   Acute CVA (cerebrovascular accident) ([place]) 06/06/2015   Acute ischemic stroke ([place]) 01/19/2015   Left midbrain/thalamus.   Alcohol use    Anxiety    Cataract    Cerebral ventriculomegaly 01/20/2015   COPD (chronic obstructive pulmonary disease) (H[facility])    Depression    Depression with anxiety    Hyperlipidemia    Hypertension    Neuromuscular disorder (H[facility])    Parkinson's disease ([place])    Substance abuse ([address])    Tobacco abuse     Patient Active Problem List   Diagnosis Date Noted   Stroke ([place]) 01/25/2017   Essential hypertension 06/06/2015   Hyperlipidemia 06/06/2015   COPD (chronic obstructive pulmonary disease) ([place]) 06/06/2015   Multiple falls 06/05/2015   Cerebral ventriculomegaly 01/20/2015   Facial droop 01/19/2015   Tobacco abuse 01/19/2015   Obesity 01/19/2015   Depression with anxiety 01/19/2015   Hyperglycemia 01/19/2015   Dysarthria 01/15/2015   Parkinson's disease ([place])     Past Surgical History:  Procedure Laterality Date   EYE SURGERY     SHOULDER SURGERY         Family History  Problem Relation  Age of Onset   Cancer Father        lung   Cancer Brother        lung   Early death Brother        suicide   Early death Brother        mva   COPD Sister    Diabetes Sister    Heart disease Sister    Early death Sister        fire    Social History   Tobacco Use   Smoking status: Current Every Day Smoker    Packs/day: 1.00    Types: Cigarettes   Smokeless tobacco: Never Used  Vaping Use   Vaping Use: Never used  Substance Use Topics   Alcohol use: No    Comment: former   Drug use: No    Home Medications Prior to Admission medications   Medication Sig Start Date End Date Taking? Authorizing Provider  albuterol (PROVENTIL HFA;VENTOLIN HFA) 108 (90 Base) MCG/ACT inhaler Inhale 1 puff into the lungs every 4 (four) hours as needed for wheezing or shortness of breath.    [provider]  aspirin EC 81 MG EC tablet Take 1 tablet (81 mg total) by mouth daily. 01/16/15   Isaac Bliss, [name], MD  atorvastatin (LIPITOR) 10 MG tablet Take 10 mg by mouth every evening.  04/27/15  [provider]  carbidopa-levodopa (SINEMET IR) 25-100 MG tablet Take 1 tablet by mouth 3 (three) times daily. 11/15/19   [provider]  clonazePAM (KLONOPIN) 1 MG tablet Take 1 mg by mouth daily as needed. 12/11/19   [provider]  FAMOTIDINE PO Take 1 tablet by mouth daily.    [provider]  furosemide (LASIX) 20 MG tablet Take 20 mg by mouth daily. 12/14/19   [provider]  gabapentin (NEURONTIN) 300 MG capsule Take 300 mg by mouth 3 (three) times daily.    [provider]  hydrochlorothiazide (HYDRODIURIL) 25 MG tablet Take 25 mg by mouth daily. 11/09/19   [provider]  Infant Care Products (DERMACLOUD) CREA Apply 1 application topically daily.  11/25/19   [provider]  ipratropium-albuterol (DUONEB) 0.5-2.5 (3) MG/3ML SOLN SMARTSIG:3 Milliliter(s) Via Nebulizer Every 6 Hours 12/14/19   [provider]  lisinopril (ZESTRIL) 20 MG tablet Take 20 mg by mouth daily. 11/15/19   [provider]  PARoxetine (PAXIL) 40 MG tablet Take 60 mg by mouth every morning.     [provider]  rOPINIRole (REQUIP) 3 MG tablet Take 3 mg by mouth in the morning, at noon, and at bedtime.     [provider]    Allergies    Patient has no known allergies.  Review of Systems   Review of Systems  Constitutional: Negative for fever.  Respiratory: Negative for shortness of breath.   Cardiovascular: Positive for leg swelling. Negative for chest pain.  Gastrointestinal: Negative for abdominal pain, nausea and vomiting.  Genitourinary: Negative for dysuria.  Musculoskeletal:       Left leg pain  All other systems reviewed and are negative.   Physical Exam Updated Vital Signs BP (!) 69/52    Pulse 50    Temp 98.3 F (36.8 C) (Oral)    Resp (!) 10    Ht 1.727 m (5\' 8" )    Wt 90.7 kg    SpO2 93%    BMI 30.41 kg/m   Physical Exam Vitals and nursing note reviewed.  Constitutional:      Appearance: He is well-developed.     Comments: Chronically ill-appearing but nontoxic  HENT:     Head: Normocephalic and atraumatic.     Nose: Nose normal.     Mouth/Throat:     Mouth: Mucous membranes are moist.  Eyes:     Pupils: Pupils are equal, round, and reactive to light.  Cardiovascular:     Rate and Rhythm: Normal rate and regular rhythm.     Heart sounds: Normal heart sounds. No murmur heard.   Pulmonary:     Effort: Pulmonary effort is normal. No respiratory distress.     Breath sounds: Normal breath sounds. No wheezing.  Abdominal:     General: Bowel sounds are normal.     Palpations: Abdomen is soft.     Tenderness: There is no abdominal tenderness. There is no rebound.  Musculoskeletal:     Cervical back: Neck supple.     Comments: 2+ pitting edema of the bilateral feet, no significant calf tenderness bilaterally or asymmetric swelling  Lymphadenopathy:      Cervical: No cervical adenopathy.  Skin:    General: Skin is warm and dry.     Comments: Erythema noted to the bilateral feet that is blanching and slightly warm  Neurological:     Mental Status: He is alert and oriented to person, place, and time.  Psychiatric:        Mood and Affect: Mood normal.     ED Results / Procedures / Treatments   Labs (all labs ordered are listed, but only abnormal results are displayed) Labs Reviewed  BASIC METABOLIC PANEL - Abnormal; Notable for the following components:      Result Value   Glucose, Bld 116 (*)    BUN 33 (*)    Creatinine, Ser 1.58 (*)    Calcium 8.3 (*)    GFR calc non Af Amer 43 (*)    GFR calc Af Amer 50 (*)    All other components within normal limits  CBC - Abnormal; Notable for the following components:   RBC 4.15 (*)    All other components within normal limits  URINALYSIS, ROUTINE W REFLEX MICROSCOPIC - Abnormal; Notable for the following components:   Specific Gravity, Urine 1.035 (*)    All other components within normal limits  D-DIMER, QUANTITATIVE (NOT AT [facility]) - Abnormal; Notable for the following components:   D-Dimer, Quant 0.63 (*)    All other components within normal limits  RESPIRATORY PANEL BY RT PCR (FLU A&B, COVID)  CULTURE, BLOOD (ROUTINE X 2)  CULTURE, BLOOD (ROUTINE X 2)  LACTIC ACID, PLASMA  LACTIC ACID, PLASMA  BRAIN NATRIURETIC PEPTIDE  ETHANOL  CORTISOL  TSH  RAPID URINE DRUG SCREEN, HOSP PERFORMED  CBG MONITORING, ED    EKG EKG Interpretation  Date/Time:  Sunday March 27 2020 00:02:38 EDT Ventricular Rate:  60 PR Interval:    QRS Duration: 95 QT Interval:  411 QTC Calculation: 411 R Axis:   51 Text Interpretation: Sinus rhythm Confirmed by Thayer Jew [phone]) on 03/27/2020 1:42:38 AM   Radiology DG Chest 2 View  Result Date: 03/27/2020 CLINICAL DATA:  Hypotension and peripheral swelling EXAM: CHEST - 2 VIEW COMPARISON:  12/21/2019 FINDINGS: Cardiac shadow is stable but  enlarged. The lungs are clear. No acute effusion or infiltrate is noted. No bony abnormality is seen. IMPRESSION: No acute abnormality noted. Electronically Signed   By: Inez Catalina M.D.   On: 03/27/2020 02:33   CT Angio Chest PE W and/or Wo Contrast  Result Date: 03/27/2020 CLINICAL DATA:  Elevated D-dimer and swollen feet EXAM: CT ANGIOGRAPHY CHEST WITH CONTRAST TECHNIQUE: Multidetector CT imaging of the chest was performed using the standard protocol during bolus administration of intravenous contrast. Multiplanar CT image reconstructions and MIPs were obtained to evaluate the vascular anatomy. CONTRAST:  97mL OMNIPAQUE IOHEXOL 350 MG/ML SOLN COMPARISON:  None. FINDINGS: Cardiovascular: There is a optimal opacification of the pulmonary arteries. There is no central,segmental, or subsegmental filling defects within the pulmonary arteries. There is unchanged cardiomegaly. No pericardial effusion or thickening. No evidence right heart strain. There is calcifications seen at the origin of the great vessels. There appears to be a non opacification of the left subclavian artery, best seen on series 4, image 19 the distal portion does appear to be partially opacified. Scattered aortic atherosclerosis is noted. Coronary artery calcifications are seen. Mediastinum/Nodes: No hilar, mediastinal, or axillary adenopathy. Thyroid gland, trachea, and esophagus demonstrate no significant findings. Lungs/Pleura: Streaky atelectasis seen at both lung bases. No large airspace consolidation or pleural effusion. No pleural effusion or pneumothorax. No airspace consolidation. Upper Abdomen: Layering calcified gallstones are present. Musculoskeletal: No chest wall abnormality. No acute or significant osseous findings. Review of the MIP images confirms the above findings. IMPRESSION: No central, segmental, or subsegmental pulmonary embolism. Bibasilar subsegmental atelectasis. There is question of a  focal non opacification of the  left subclavian artery, which could be due to a focal thrombus, however the distal portion is opacified. Cholelithiasis Aortic Atherosclerosis (ICD10-I70.0). Electronically Signed   By: Prudencio Pair M.D.   On: 03/27/2020 02:41    Procedures Procedures (including critical care time)  CRITICAL CARE Performed by: Merryl Hacker   Total critical care time: 70 minutes  Critical care time was exclusive of separately billable procedures and treating other patients.  Critical care was necessary to treat or prevent imminent or life-threatening deterioration.  Critical care was time spent personally by me on the following activities: development of treatment plan with patient and/or surrogate as well as nursing, discussions with consultants, evaluation of patient's response to treatment, examination of patient, obtaining history from patient or surrogate, ordering and performing treatments and interventions, ordering and review of laboratory studies, ordering and review of radiographic studies, pulse oximetry and re-evaluation of patient's condition.  Medications Ordered in ED Medications  0.9 %  sodium chloride infusion (has no administration in time range)  norepinephrine (LEVOPHED) 4mg  in 239mL premix infusion (has no administration in time range)  cefTRIAXone (ROCEPHIN) 1 g in sodium chloride 0.9 % 100 mL IVPB (has no administration in time range)  hydrocortisone sodium succinate (SOLU-CORTEF) 100 MG injection 100 mg (has no administration in time range)  oxyCODONE-acetaminophen (PERCOCET/ROXICET) 5-325 MG per tablet 1 tablet (1 tablet Oral Given 03/27/20 0034)  sodium chloride 0.9 % bolus 500 mL (0 mLs Intravenous Stopped 03/27/20 0230)  sodium chloride 0.9 % bolus 1,000 mL (0 mLs Intravenous Stopped 03/27/20 0312)  iohexol (OMNIPAQUE) 350 MG/ML injection 80 mL (80 mLs Intravenous Contrast Given 03/27/20 0208)  0.9 %  sodium chloride infusion ( Intravenous New Bag/Given 03/27/20 9417)    ED Course   I have reviewed the triage vital signs and the nursing notes.  Pertinent labs & imaging results that were available during my care of the patient were reviewed by me and considered in my medical decision making (see chart for details).  Clinical Course as of Mar 27 622  Sun Mar 27, 2020  4081 Patient reports that he feels better and feels "normal."  He has persistently low blood pressures although maps are greater than 70.  Reports he took his blood pressure medication yesterday.  Work-up has been fairly unremarkable with no obvious infectious source.  Lactate is normal.  He has been given 1.5 L of fluid and is now on 125 of normal saline.  He was given 1 Percocet but I would not anticipate his blood pressure to drop with 1 dose of Percocet.  Awaiting urinalysis.   [CH]  4481 Patient transiently responds to fluid resuscitation.  Recent blood pressure 98/60.  However, trending downward.  I have reviewed his medications.  He is not on any beta blockade or significant strong blood pressure medications.  He is on levo dopa carbidopa and has a history of Parkinson's.  This in itself could result in some hypotension.  Additionally he does appear dehydrated.  Will admit for observation.  At this time do not feel he requires vasopressors.   [CH]  0607 Discussed with hospitalist.  Patient has persistent hypotension despite appropriate fluid resuscitation.  No obvious sepsis.  He has some slight erythema of the bilateral lower extremities but no raging cellulitis.  Lactate is normal.  He was scanned for blood clots.  Question whether Parkinson's or his Parkinson's med could contribute to low blood pressure.  Additionally, question adrenal insufficiency.  Will obtain a cortisol level and a thyroid level.  UDS added to work-up as well.  Given persistence of hypotension he was bolused 500 additional cc of fluid and will be started on peripheral Levophed.  Blood cultures were obtained and in consultation with the  hospitalist will give 1 dose of Rocephin to cover for possible cellulitis while investigating further etiologies.   [CH]  [id number] On recheck, patient blood pressure 100/61.  Last fluid bolus is infusing.  Discussed with nursing plan of care.  Hospitalist to evaluate   [CH]    Clinical Course User Index [CH] Han Lysne, Barbette Hair, MD   MDM Rules/Calculators/A&P                           Patient presents initially with lower extremity edema and pain in the left lower extremity.  Noted by EMS to be hypotensive.  No history of the same.  He is overall otherwise nontoxic appearing.  He is afebrile.  He is not symptomatic from his blood pressure.  Clinically he appears somewhat dry.  He has some lower extremity edema to suggest volume overload although he is not hypoxic.  Lab work obtained.  EKG without ischemic changes.  Chest x-ray without pneumothorax or pneumonia.  Lactate is normal.  No significant metabolic derangements.  He has some mild AKI with a creatinine of 1.5 which could indicate dehydration.  He was given a total of 2 L of fluid with transient improvement of his blood pressures while fluids were infusing.  Urinalysis without evidence of UTI.  Unclear etiology of hypotension.  He does not appear septic.  Only source of potential infection may be mild bilateral cellulitis.  Will obtain blood cultures and give 1 dose of Rocephin.  Other considerations include adrenal insufficiency thyroid disease or his known Parkinson's disease/meds.  Had extensive conversation with hospitalist regarding admission and ongoing care.  He request peripheral Levophed temporarily while differentiating his cause of hypotension.  Additionally, cortisol levels were obtained and will give 1 dose of stress dose steroids.  Regarding his lower extremity pain, D-dimer was mildly elevated.  Cannot obtain a DVT study at this time.  However, PE study was obtained given his hypotension and his CT shows no evidence of pulmonary embolism.   There is a questionable abnormality in the left subclavian artery.  This is of unknown clinical significance.  Overall, patient states he feels well, he does not appear symptomatic from his hypotension.  Multiple rechecks he has no complaints.  Hospitalist to evaluate.   Final Clinical Impression(s) / ED Diagnoses Final diagnoses:  Pain of left lower extremity  Hypotension, unspecified hypotension type    Rx / DC Orders ED Discharge Orders    None       Merryl Hacker, MD 03/27/20 [id number]

## 2020-03-27 NOTE — H&P (Signed)
History and Physical  Lucas [name] TDD:[id number] DOB: 07/18/1946 DOA: 03/26/2020  Referring physician: Thayer Jew, MD PCP: Alfonse Flavors, MD  Patient coming from: Home  Chief Complaint: Left leg Pain  HPI: Lucas [name] is a 74 y.o. male with medical history significant for ischemic stroke, COPD, Parkinson's disease, HTN, HLD and tobacco and alcohol abuse who presented to the ED with complaints of left leg pain which started few hours prior to presenting to the emergency department.  Leg pain was 10/10 on pain scale and was in the posterior aspect of the left leg.  He denies any recent fall or injury.  He states that his legs were chronically swollen (>6 months).  He activated EMS, patient was noted to be hypotensive en route to the ED.  He denies chest pain, shortness of breath, fever, chills, nausea, vomiting or abdominal pain.  ED Course:  In the emergency department, BP was 88/55 (MAP 66) on arrival, she was also bradycardic in the 50s.  Work-up in the ED showed normal CBC, BUN to creatinine 33/1.58 (baseline creatinine 1.0-1.1), lactic acid x2 was negative, BNP was 30, alcohol level < 10.  Blood pressure was noted to decrease to SBP of low 70s and high 60s (especially when he goes to sleep), he was provided with IV hydration with improvement in BP, but this quickly goes back down after completing IV hydration, however patient was not in any distress and his leg pain has since improved after being given oxycodone 5-325 mg x 1.  CT angiography chest with contrast showed no central, segmental or subsegmental pulmonary embolism.  Chest x-ray showed no acute abnormality.  Due to unknown cause of patient low blood pressure, cellulitis was suspected as a differential and empiric IV ceftriaxone was given, 100 mg of Solu-Cortef after obtaining blood draw for cortisol level was given, TSH was also checked pending as well.  Hospitalist was asked (for further evaluation and  management.  Review of Systems: Constitutional: Negative for chills and fever.  HENT: Negative for ear pain and sore throat.   Eyes: Negative for pain and visual disturbance.  Respiratory: Negative for cough, chest tightness and shortness of breath.   Cardiovascular: Positive for bilateral leg swelling.  Negative for chest pain and palpitations.  Gastrointestinal: Negative for abdominal pain and vomiting.  Endocrine: Negative for polyphagia and polyuria.  Genitourinary: Negative for decreased urine volume, dysuria Musculoskeletal: Positive for left leg pain.  Negative for arthralgias and back pain.  Skin: Negative for color change and rash.  Allergic/Immunologic: Negative for immunocompromised state.  Neurological: Negative for tremors, syncope, speech difficulty, weakness, light-headedness and headaches.  Hematological: Does not bruise/bleed easily.  All other systems reviewed and are negative   Past Medical History:  Diagnosis Date  . Acute CVA (cerebrovascular accident) ([place]) 06/06/2015  . Acute ischemic stroke ([place]) 01/19/2015   Left midbrain/thalamus.  Marland Kitchen AKI (acute kidney injury) ([place]) 03/27/2020  . Alcohol use   . Anxiety   . Cataract   . Cerebral ventriculomegaly 01/20/2015  . COPD (chronic obstructive pulmonary disease) ([place])   . Depression   . Depression with anxiety   . Hyperlipidemia   . Hypertension   . Neuromuscular disorder ([place])   . Parkinson's disease ([place])   . Substance abuse ([place])   . Tobacco abuse    Past Surgical History:  Procedure Laterality Date  . EYE SURGERY    . SHOULDER SURGERY      Social History:  reports that he has been  smoking cigarettes. He has been smoking about 1.00 pack per day. He has never used smokeless tobacco. He reports that he does not drink alcohol and does not use drugs.   No Known Allergies  Family History  Problem Relation Age of Onset  . Cancer Father        lung  . Cancer Brother        lung  . Early death Brother         suicide  . Early death Brother        mva  . COPD Sister   . Diabetes Sister   . Heart disease Sister   . Early death Sister        fire     Prior to Admission medications   Medication Sig Start Date End Date Taking? Authorizing Provider  albuterol (PROVENTIL HFA;VENTOLIN HFA) 108 (90 Base) MCG/ACT inhaler Inhale 1 puff into the lungs every 4 (four) hours as needed for wheezing or shortness of breath.    [provider]  aspirin EC 81 MG EC tablet Take 1 tablet (81 mg total) by mouth daily. 01/16/15   Isaac Bliss, [name], MD  atorvastatin (LIPITOR) 10 MG tablet Take 10 mg by mouth every evening.  04/27/15   [provider]  carbidopa-levodopa (SINEMET IR) 25-100 MG tablet Take 1 tablet by mouth 3 (three) times daily. 11/15/19   [provider]  clonazePAM (KLONOPIN) 1 MG tablet Take 1 mg by mouth daily as needed. 12/11/19   [provider]  FAMOTIDINE PO Take 1 tablet by mouth daily.    [provider]  furosemide (LASIX) 20 MG tablet Take 20 mg by mouth daily. 12/14/19   [provider]  gabapentin (NEURONTIN) 300 MG capsule Take 300 mg by mouth 3 (three) times daily.    [provider]  hydrochlorothiazide (HYDRODIURIL) 25 MG tablet Take 25 mg by mouth daily. 11/09/19   [provider]  Infant Care Products (DERMACLOUD) CREA Apply 1 application topically daily.  11/25/19   [provider]  ipratropium-albuterol (DUONEB) 0.5-2.5 (3) MG/3ML SOLN SMARTSIG:3 Milliliter(s) Via Nebulizer Every 6 Hours 12/14/19   [provider]  lisinopril (ZESTRIL) 20 MG tablet Take 20 mg by mouth daily. 11/15/19   [provider]  PARoxetine (PAXIL) 40 MG tablet Take 60 mg by mouth every morning.     [provider]  rOPINIRole (REQUIP) 3 MG tablet Take 3 mg by mouth in the morning, at noon, and at bedtime.     [provider]    Physical Exam: BP 99/65   Pulse 56   Temp 98.3 F (36.8  C) (Oral)   Resp (!) 10   Ht 5\' 8"  (1.727 m)   Wt 90.7 kg   SpO2 96%   BMI 30.41 kg/m   . General: 74 y.o. year-old male well developed well nourished in no acute distress.  Alert and oriented x3. Marland Kitchen HEENT: Normocephalic, atraumatic . Neck: Supple, trachea medial . Cardiovascular: Regular rate and rhythm with no rubs or gallops.  No thyromegaly or JVD noted.  2+ bilateral pitting edema with no asymmetric swelling or calf tenderness. 2/4 pulses in all 4 extremities. Marland Kitchen Respiratory: Scattered wheezes bilaterally on auscultation. Good inspiratory effort. . Abdomen: Soft nontender nondistended with normal bowel sounds x4 quadrants. . Muskuloskeletal: No cyanosis, clubbing noted bilaterally . Neuro: CN II-XII intact, strength, sensation, reflexes . Skin: Bilateral lower extremity warmth and swelling noted (chronic per patient). . Psychiatry: Judgement and  insight appear normal. Mood is appropriate for condition and setting          Labs on Admission:  Basic Metabolic Panel: Recent Labs  Lab 03/26/20 2354  NA 141  K 3.6  CL 107  CO2 26  GLUCOSE 116*  BUN 33*  CREATININE 1.58*  CALCIUM 8.3*   Liver Function Tests: No results for input(s): AST, ALT, ALKPHOS, BILITOT, PROT, ALBUMIN in the last 168 hours. No results for input(s): LIPASE, AMYLASE in the last 168 hours. No results for input(s): AMMONIA in the last 168 hours. CBC: Recent Labs  Lab 03/26/20 2354  WBC 7.8  HGB 13.3  HCT 39.6  MCV 95.4  PLT 190   Cardiac Enzymes: No results for input(s): CKTOTAL, CKMB, CKMBINDEX, TROPONINI in the last 168 hours.  BNP (last 3 results) Recent Labs    12/21/19 1605 03/26/20 2354  BNP 62.0 30.0    ProBNP (last 3 results) No results for input(s): PROBNP in the last 8760 hours.  CBG: No results for input(s): GLUCAP in the last 168 hours.  Radiological Exams on Admission: DG Chest 2 View  Result Date: 03/27/2020 CLINICAL DATA:  Hypotension and peripheral swelling EXAM:  CHEST - 2 VIEW COMPARISON:  12/21/2019 FINDINGS: Cardiac shadow is stable but enlarged. The lungs are clear. No acute effusion or infiltrate is noted. No bony abnormality is seen. IMPRESSION: No acute abnormality noted. Electronically Signed   By: Inez Catalina M.D.   On: 03/27/2020 02:33   CT Angio Chest PE W and/or Wo Contrast  Result Date: 03/27/2020 CLINICAL DATA:  Elevated D-dimer and swollen feet EXAM: CT ANGIOGRAPHY CHEST WITH CONTRAST TECHNIQUE: Multidetector CT imaging of the chest was performed using the standard protocol during bolus administration of intravenous contrast. Multiplanar CT image reconstructions and MIPs were obtained to evaluate the vascular anatomy. CONTRAST:  67mL OMNIPAQUE IOHEXOL 350 MG/ML SOLN COMPARISON:  None. FINDINGS: Cardiovascular: There is a optimal opacification of the pulmonary arteries. There is no central,segmental, or subsegmental filling defects within the pulmonary arteries. There is unchanged cardiomegaly. No pericardial effusion or thickening. No evidence right heart strain. There is calcifications seen at the origin of the great vessels. There appears to be a non opacification of the left subclavian artery, best seen on series 4, image 19 the distal portion does appear to be partially opacified. Scattered aortic atherosclerosis is noted. Coronary artery calcifications are seen. Mediastinum/Nodes: No hilar, mediastinal, or axillary adenopathy. Thyroid gland, trachea, and esophagus demonstrate no significant findings. Lungs/Pleura: Streaky atelectasis seen at both lung bases. No large airspace consolidation or pleural effusion. No pleural effusion or pneumothorax. No airspace consolidation. Upper Abdomen: Layering calcified gallstones are present. Musculoskeletal: No chest wall abnormality. No acute or significant osseous findings. Review of the MIP images confirms the above findings. IMPRESSION: No central, segmental, or subsegmental pulmonary embolism. Bibasilar  subsegmental atelectasis. There is question of a focal non opacification of the left subclavian artery, which could be due to a focal thrombus, however the distal portion is opacified. Cholelithiasis Aortic Atherosclerosis (ICD10-I70.0). Electronically Signed   By: Prudencio Pair M.D.   On: 03/27/2020 02:41    EKG: I independently viewed the EKG done and my findings are as followed: EKG shows sinus rhythm at rate of 60 bpm  Assessment/Plan Present on Admission: . Hypotension . Parkinson's disease ([place]) . Tobacco abuse . Essential hypertension . Hyperlipidemia . COPD (chronic obstructive pulmonary disease) ([place]) . Stroke [facility])  Active Problems:   Parkinson's disease ([place])   Tobacco abuse  Multiple falls   Essential hypertension   Hyperlipidemia   COPD (chronic obstructive pulmonary disease) ([facility])   Stroke ([facility])   Hypotension   Left leg pain   AKI (acute kidney injury) ([place])   Cellulitis   Restless leg syndrome  Hypotension No obvious cause of patient's hypotension at this time, differential includes orthostatic hypotension in Parkinson disease, however, patient was hypotensive while lying in bed, so it is unlikely to be due to this. TSH was 1.49 Cortisol level obtained is still pending 100 milligram of IV Solu-Cortef was given in the ED due to suspicion for adrenal insufficiency (though blood glucose, sodium and potassium levels are normal). Patient was also empirically started on IV ceftriaxone due to possibility of cellulitis, this will be  transitorily continued with plan to de-escalate pending blood culture. This could also be due to patient's BP meds including Lasix, hydrochlorothiazide and ACE inhibitor.  These will be temporarily held at this time. Patient was started on IV Levophed due to persistent BP decrease once IV fluid finishes and she will be admitted to ICU. Bilateral lower extremity ultrasound and echocardiogram will be done.  Questionable cellulitis Patient  presents with bilateral chronic lower extremity swelling, slightly warm to touch bilaterally, but due to unknown source of patient's hypotension, he was empirically started on IV ceftriaxone in the ED as described above.  IV antibiotics will be stopped pending blood culture and better clarification regarding patient's hypotensive status. Lactic acid x2 was negative  Left leg pain in the setting of restless leg syndrome-resolved History of multiple falls Continue Tylenol 650 mg p.o. every 6 hours as needed Continue fall precaution neuro checks Continue PT/OT eval and treat Continue home ropinirole when med rec is updated  Acute kidney injury BUN to creatinine 33/1.58 (baseline creatinine 1.0-1.1) Renally adjust medications, avoid nephrotoxic agents/dehydration/hypotension  COPD Scattered wheezes noted on auscultation Continue home Proventil HFA as needed when med rec is updated Continue DuoNeb every 4 hours as needed  Parkinson's disease Temporarily hold home meds pending improved BP  Essential hypertension Hold BP meds at this time due to hypotension  Hyperlipidemia Continue Lipitor when med rec is updated  Tobacco abuse Consulted on tobacco use cessation, continue quitting patch   DVT prophylaxis: Heparin subcu  Code Status: Full code  Family Communication: None at bedside  Disposition Plan:  Patient is from:                        home Anticipated DC to:                   SNF or family members home Anticipated DC date:               1 day Anticipated DC barriers:           Unstable to discharge at this time due to hypotension    Consults called: None  Admission status: Observation  Bernadette Hoit MD [facility]  If 7PM-7AM, please contact night-coverage [email or web address]  03/27/2020, 7:26 AM

## 2020-03-27 NOTE — Progress Notes (Addendum)
PROGRESS NOTE  Lucas [name]:[id number] DOB: 05-27-1946 DOA: 03/26/2020 PCP: Alfonse Flavors, MD  Brief History:  74 year old male with a history of stroke, alcohol abuse, COPD, hypertension, hyperlipidemia, Parkinson's disease, tobacco abuse, depression/anxiety presenting with worsening left lower extremity leg pain and edema. Review of the medical record shows that the patient has had chronic lower extremity/pedal edema dating back to February 2019 when he was evaluated by Dr. Blanchie Serve.  Nevertheless, the patient stated that his left lower extremity pain and edema had worsened in last 24 hours.  EMS was activated.  The patient was noted to be hypotensive by EMS.  He remained hypotensive in the emergency department.  He denies any recent injury or trauma to his extremity.  He last took his anti-HTN on 03/26/20. He denied any fevers, chills, chest pain, shortness breath, cough, hemoptysis, nausea, vomiting or diarrhea, abdominal pain. He continues to smoke 5 cigs/day.  His last beer was one week prior to admission.  He has been taking motrin for his leg pain for the past week.  In the emergency department, the patient was afebrile but was hypotensive.  Patient was given 2 L normal saline with relative improvement of his blood pressure.  WBC was 7.8 with lactic acid peaking at 1.3.  BMP was 30.0.  D-dimer was 0.63.  Serum creatinine 1.58.  Urinalysis was negative for pyuria.  Chest x-ray was negative.  CTA chest was negative for PE but showed a focal nonopacification of the left subclavian artery with distal opacification.  Blood cultures were obtained and the patient was started on empiric ceftriaxone.  Assessment/Plan: Hypotension -Remain concerned about underlying infectious cause although the patient has normal lactic acid -Check procalcitonin -Certainly the patient's antihypertensive medications as well as dysautonomia from his Parkinson's disease may be  contributing -Follow-up TSH and am cortisol -Continue Solu-Cortef for now -Continue IV fluids--change to LR -Holding lisinopril/HCTZ/furosemide -Echocardiogram -Personally reviewed EKG--sinus rhythm, nonspecific T wave changes -wean off levophed--on 2 mcg/kg/min at time of my eval -empiric vanc/cefepime for now  Lower extremity edema and pain -Venous duplex -Urine protein creatinine ratio  Acute kidney injury -Baseline creatinine 0.9-1.1 -Secondary to volume depletion and hemodynamic changes with NSAID contribution -Presented with serum creatinine 1.58  Nonopacification of left subclavian -case discussed with radiologist, Dr. Dolores Lory represents atherosclerotic plaque, not hemodynamically significant  COPD -Stable on room air -Continue bronchodilators  Hyperlipidemia -Continue statin  Alcohol and substance abuse -Alcohol level negative -Urine drug screen  Parkinson's disease -Continue ropinirole  Essential hypertension -Holding antihypertensive medications as discussed above      Status is: Observation  The patient will require care spanning > 2 midnights and should be moved to inpatient because: IV treatments appropriate due to intensity of illness or inability to take PO  Dispo: The patient is from: Home              Anticipated d/c is to: Home              Anticipated d/c date is: 2 days              Patient currently is not medically stable to d/c.     The patient is critically ill with multiple organ systems failure and requires high complexity decision making for assessment and support, frequent evaluation and titration of therapies, application of advanced monitoring technologies and extensive interpretation of multiple databases.  Critical care time - 35 mins.  Family Communication:   No Family at bedside  Consultants:    Code Status:  FULL   DVT Prophylaxis:  [place] Heparin   Procedures: As Listed in Progress Note  Above  Antibiotics: Ceftriaxone 8/1 Vanco 8/1>>> Cefepime 8/1>>>     Subjective: Patient denies fevers, chills, headache, chest pain, dyspnea, nausea, vomiting, diarrhea, abdominal pain, dysuria, hematuria, hematochezia, and melena.   Objective: Vitals:   03/27/20 0547 03/27/20 0549 03/27/20 0600 03/27/20 0630  BP: (!) 57/43 (!) 70/51 100/71 99/65  Pulse: 51 52 61 56  Resp: (!) 10 (!) 11 12 (!) 10  Temp:      TempSrc:      SpO2: 92% 93% 94% 96%  Weight:      Height:        Intake/Output Summary (Last 24 hours) at 03/27/2020 0743 Last data filed at 03/26/2020 2232 Gross per 24 hour  Intake 700 ml  Output --  Net 700 ml   Weight change:  Exam:   General:  Pt is alert, follows commands appropriately, not in acute distress  HEENT: No icterus, No thrush, No neck mass, Early/AT  Cardiovascular: RRR, S1/S2, no rubs, no gallops  Respiratory: bibasilar crackles. Upper airway wheeze  Abdomen: Soft/+BS, non tender, non distended, no guarding  Extremities: 1+LE  edema, No lymphangitis, No petechiae, No rashes, no synovitis   Data Reviewed: I have personally reviewed following labs and imaging studies Basic Metabolic Panel: Recent Labs  Lab 03/26/20 2354  NA 141  K 3.6  CL 107  CO2 26  GLUCOSE 116*  BUN 33*  CREATININE 1.58*  CALCIUM 8.3*   Liver Function Tests: No results for input(s): AST, ALT, ALKPHOS, BILITOT, PROT, ALBUMIN in the last 168 hours. No results for input(s): LIPASE, AMYLASE in the last 168 hours. No results for input(s): AMMONIA in the last 168 hours. Coagulation Profile: No results for input(s): INR, PROTIME in the last 168 hours. CBC: Recent Labs  Lab 03/26/20 2354  WBC 7.8  HGB 13.3  HCT 39.6  MCV 95.4  PLT 190   Cardiac Enzymes: No results for input(s): CKTOTAL, CKMB, CKMBINDEX, TROPONINI in the last 168 hours. BNP: Invalid input(s): POCBNP CBG: No results for input(s): GLUCAP in the last 168 hours. HbA1C: No results for  input(s): HGBA1C in the last 72 hours. Urine analysis:    Component Value Date/Time   COLORURINE YELLOW 03/27/2020 0420   APPEARANCEUR CLEAR 03/27/2020 0420   LABSPEC 1.035 (H) 03/27/2020 0420   PHURINE 5.0 03/27/2020 0420   GLUCOSEU NEGATIVE 03/27/2020 0420   HGBUR NEGATIVE 03/27/2020 0420   BILIRUBINUR NEGATIVE 03/27/2020 0420   KETONESUR NEGATIVE 03/27/2020 0420   PROTEINUR NEGATIVE 03/27/2020 0420   UROBILINOGEN 0.2 06/05/2015 1821   NITRITE NEGATIVE 03/27/2020 0420   LEUKOCYTESUR NEGATIVE 03/27/2020 0420   Sepsis Labs: @LABRCNTIP (procalcitonin:4,lacticidven:4) ) Recent Results (from the past 240 hour(s))  Respiratory Panel by RT PCR (Flu A&B, Covid) - Nasopharyngeal Swab     Status: None   Collection Time: 03/27/20  5:55 AM   Specimen: Nasopharyngeal Swab  Result Value Ref Range Status   SARS Coronavirus 2 by RT PCR NEGATIVE NEGATIVE Final    Comment: (NOTE) SARS-CoV-2 target nucleic acids are NOT DETECTED.  The SARS-CoV-2 RNA is generally detectable in upper respiratoy specimens during the acute phase of infection. The lowest concentration of SARS-CoV-2 viral copies this assay can detect is 131 copies/mL. A negative result does not preclude SARS-Cov-2 infection and should not be used as the sole basis for  treatment or other patient management decisions. A negative result may occur with  improper specimen collection/handling, submission of specimen other than nasopharyngeal swab, presence of viral mutation(s) within the areas targeted by this assay, and inadequate number of viral copies (<131 copies/mL). A negative result must be combined with clinical observations, patient history, and epidemiological information. The expected result is Negative.  Fact Sheet for Patients:  PinkCheek.be  Fact Sheet for Healthcare Providers:  GravelBags.it  This test is no t yet approved or cleared by the [place] FDA  and  has been authorized for detection and/or diagnosis of SARS-CoV-2 by FDA under an Emergency Use Authorization (EUA). This EUA will remain  in effect (meaning this test can be used) for the duration of the COVID-19 declaration under Section 564(b)(1) of the Act, 21 U.S.C. section 360bbb-3(b)(1), unless the authorization is terminated or revoked sooner.     Influenza A by PCR NEGATIVE NEGATIVE Final   Influenza B by PCR NEGATIVE NEGATIVE Final    Comment: (NOTE) The Xpert Xpress SARS-CoV-2/FLU/RSV assay is intended as an aid in  the diagnosis of influenza from Nasopharyngeal swab specimens and  should not be used as a sole basis for treatment. Nasal washings and  aspirates are unacceptable for Xpert Xpress SARS-CoV-2/FLU/RSV  testing.  Fact Sheet for Patients: PinkCheek.be  Fact Sheet for Healthcare Providers: GravelBags.it  This test is not yet approved or cleared by the [place] FDA and  has been authorized for detection and/or diagnosis of SARS-CoV-2 by  FDA under an Emergency Use Authorization (EUA). This EUA will remain  in effect (meaning this test can be used) for the duration of the  Covid-19 declaration under Section 564(b)(1) of the Act, 21  U.S.C. section 360bbb-3(b)(1), unless the authorization is  terminated or revoked. Performed at [facility], [address], [place], [place] [address]      Scheduled Meds: . heparin  5,000 Units Subcutaneous Q8H  . nicotine  21 mg Transdermal Daily   Continuous Infusions: . sodium chloride    . [START ON 03/28/2020] cefTRIAXone (ROCEPHIN)  IV    . norepinephrine (LEVOPHED) Adult infusion 2 mcg/min (03/27/20 0741)    Procedures/Studies: DG Chest 2 View  Result Date: 03/27/2020 CLINICAL DATA:  Hypotension and peripheral swelling EXAM: CHEST - 2 VIEW COMPARISON:  12/21/2019 FINDINGS: Cardiac shadow is stable but enlarged. The lungs are clear. No acute effusion or  infiltrate is noted. No bony abnormality is seen. IMPRESSION: No acute abnormality noted. Electronically Signed   By: Inez Catalina M.D.   On: 03/27/2020 02:33   CT Angio Chest PE W and/or Wo Contrast  Result Date: 03/27/2020 CLINICAL DATA:  Elevated D-dimer and swollen feet EXAM: CT ANGIOGRAPHY CHEST WITH CONTRAST TECHNIQUE: Multidetector CT imaging of the chest was performed using the standard protocol during bolus administration of intravenous contrast. Multiplanar CT image reconstructions and MIPs were obtained to evaluate the vascular anatomy. CONTRAST:  7mL OMNIPAQUE IOHEXOL 350 MG/ML SOLN COMPARISON:  None. FINDINGS: Cardiovascular: There is a optimal opacification of the pulmonary arteries. There is no central,segmental, or subsegmental filling defects within the pulmonary arteries. There is unchanged cardiomegaly. No pericardial effusion or thickening. No evidence right heart strain. There is calcifications seen at the origin of the great vessels. There appears to be a non opacification of the left subclavian artery, best seen on series 4, image 19 the distal portion does appear to be partially opacified. Scattered aortic atherosclerosis is noted. Coronary artery calcifications are seen. Mediastinum/Nodes: No hilar, mediastinal,  or axillary adenopathy. Thyroid gland, trachea, and esophagus demonstrate no significant findings. Lungs/Pleura: Streaky atelectasis seen at both lung bases. No large airspace consolidation or pleural effusion. No pleural effusion or pneumothorax. No airspace consolidation. Upper Abdomen: Layering calcified gallstones are present. Musculoskeletal: No chest wall abnormality. No acute or significant osseous findings. Review of the MIP images confirms the above findings. IMPRESSION: No central, segmental, or subsegmental pulmonary embolism. Bibasilar subsegmental atelectasis. There is question of a focal non opacification of the left subclavian artery, which could be due to a focal  thrombus, however the distal portion is opacified. Cholelithiasis Aortic Atherosclerosis (ICD10-I70.0). Electronically Signed   By: Prudencio Pair M.D.   On: 03/27/2020 02:41    Orson Eva, DO  [facility]  If 7PM-7AM, please contact night-coverage [email or web address] Password [email or web address] 03/27/2020, 7:43 AM   LOS: 0 days

## 2020-03-28 ENCOUNTER — Encounter (HOSPITAL_COMMUNITY): Payer: Self-pay | Admitting: Internal Medicine

## 2020-03-28 ENCOUNTER — Inpatient Hospital Stay (HOSPITAL_COMMUNITY): Payer: Medicare HMO

## 2020-03-28 LAB — COMPREHENSIVE METABOLIC PANEL
ALT: 19 U/L (ref 0–44)
AST: 16 U/L (ref 15–41)
Albumin: 3 g/dL — ABNORMAL LOW (ref 3.5–5.0)
Alkaline Phosphatase: 64 U/L (ref 38–126)
Anion gap: 7 (ref 5–15)
BUN: 22 mg/dL (ref 8–23)
CO2: 25 mmol/L (ref 22–32)
Calcium: 8 mg/dL — ABNORMAL LOW (ref 8.9–10.3)
Chloride: 110 mmol/L (ref 98–111)
Creatinine, Ser: 1.18 mg/dL (ref 0.61–1.24)
GFR calc Af Amer: >60 mL/min (ref >60)
GFR calc non Af Amer: >60 mL/min (ref >60)
Glucose, Bld: 127 mg/dL — ABNORMAL HIGH (ref 70–99)
Potassium: 3.6 mmol/L (ref 3.5–5.1)
Sodium: 142 mmol/L (ref 135–145)
Total Bilirubin: 0.8 mg/dL (ref 0.3–1.2)
Total Protein: 5.5 g/dL — ABNORMAL LOW (ref 6.5–8.1)

## 2020-03-28 LAB — PROTIME-INR
INR: 1.1 (ref 0.8–1.2)
Prothrombin Time: 13.5 seconds (ref 11.4–15.2)

## 2020-03-28 LAB — MAGNESIUM: Magnesium: 1.8 mg/dL (ref 1.7–2.4)

## 2020-03-28 LAB — CBC
HCT: 40 % (ref 39.0–52.0)
Hemoglobin: 13.2 g/dL (ref 13.0–17.0)
MCH: 31.7 pg (ref 26.0–34.0)
MCHC: 33 g/dL (ref 30.0–36.0)
MCV: 96.2 fL (ref 80.0–100.0)
Platelets: 173 10*3/uL (ref 150–400)
RBC: 4.16 MIL/uL — ABNORMAL LOW (ref 4.22–5.81)
RDW: 13.6 % (ref 11.5–15.5)
WBC: 11.2 10*3/uL — ABNORMAL HIGH (ref 4.0–10.5)
nRBC: 0 % (ref 0.0–0.2)

## 2020-03-28 LAB — PHOSPHORUS: Phosphorus: 2.4 mg/dL — ABNORMAL LOW (ref 2.5–4.6)

## 2020-03-28 MED ORDER — LORAZEPAM 2 MG/ML IJ SOLN
0.5000 mg | Freq: Once | INTRAMUSCULAR | Status: AC
  Administered 2020-03-28: 0.5 mg via INTRAVENOUS
  Filled 2020-03-28: qty 1

## 2020-03-28 MED ORDER — LORAZEPAM 2 MG/ML IJ SOLN
0.5000 mg | Freq: Once | INTRAMUSCULAR | Status: DC
  Filled 2020-03-28: qty 1

## 2020-03-28 NOTE — Progress Notes (Signed)
Patient refused AM medication. Patient stated he wasn't going to take the medication because he didn't know if it was the right medicine. Patient also stated he wanted to leave the hospital. MD made aware This nurse attempted multiple times to talk with patient about his medicine stating it was the same dose as he receives at home patient still refused to take his medicine. Patient continues to yell out for someone. The nurse asked the [facility] RN) if she would speak to the patient. AC spoke with patient. Patient still refused to take medication and still wanted to leave the hospital. MD spoke with patient and patients family members. Will continue to monitor throughout shift.

## 2020-03-28 NOTE — Evaluation (Signed)
Physical Therapy Evaluation Patient Details Name: Lucas [name] MRN: [id number] DOB: 1946-01-15 Today's Date: 03/28/2020   History of Present Illness  Lucas [name] is a 74 y.o. male with medical history significant for ischemic stroke, COPD, Parkinson's disease, HTN, HLD and tobacco and alcohol abuse who presented to the ED with complaints of left leg pain which started few hours prior to presenting to the emergency department.  Leg pain was 10/10 on pain scale and was in the posterior aspect of the left leg.  He denies any recent fall or injury.  He states that his legs were chronically swollen (>6 months).  He activated EMS, patient was noted to be hypotensive en route to the ED.  He denies chest pain, shortness of breath, fever, chills, nausea, vomiting or abdominal pain.    Clinical Impression  Patient presents slightly agitated/restless and agreeable for therapy.  Patient demonstrates slow shaky movement with much trembling of BUE when attempting functional tasks, has to hold onto bed rail most of time to maintain sitting balance, very unsteady when standing and limited to couple of slow labored side steps, limited to bedside due to severe fall risk.  Patient put back to bed after therapy.  Patient will benefit from continued physical therapy in hospital and recommended venue below to increase strength, balance, endurance for safe ADLs and gait.    Follow Up Recommendations SNF;Supervision for mobility/OOB;Supervision/Assistance - 24 hour    Equipment Recommendations  None recommended by PT    Recommendations for Other Services       Precautions / Restrictions Precautions Precautions: Fall Restrictions Weight Bearing Restrictions: No      Mobility  Bed Mobility Overal bed mobility: Needs Assistance Bed Mobility: Supine to Sit;Sit to Supine     Supine to sit: Max assist Sit to supine: Mod assist;Max assist   General bed mobility comments: increased time, very shaky  movement  Transfers Overall transfer level: Needs assistance Equipment used: Rolling walker (2 wheeled) Transfers: Sit to/from Stand Sit to Stand: Max assist         General transfer comment: increased time, shakey labored movement  Ambulation/Gait Ambulation/Gait assistance: Max assist Gait Distance (Feet): 2 Feet   Gait Pattern/deviations: Decreased step length - right;Decreased step length - left;Decreased stride length Gait velocity: slow   General Gait Details: limited to 1-2 very unsteady shaky side steps before having to sit due to c/o fatigue  Stairs            Wheelchair Mobility    Modified Rankin (Stroke Patients Only)       Balance Overall balance assessment: Needs assistance Sitting-balance support: Feet supported;No upper extremity supported Sitting balance-Leahy Scale: Poor Sitting balance - Comments: fair/poor seated at EOB, had to hold onto bed rail most of time Postural control: Posterior lean Standing balance support: During functional activity;Bilateral upper extremity supported Standing balance-Leahy Scale: Poor Standing balance comment: using RW                             Pertinent Vitals/Pain Pain Assessment: No/denies pain    Home Living Family/patient expects to be discharged to:: Private residence Living Arrangements: Alone Available Help at Discharge: Family;Available 24 hours/day Type of Home: Mobile home Home Access: Ramped entrance     Home Layout: One level Home Equipment: Grab bars - toilet;Grab bars - tub/shower;Walker - 2 wheels;Bedside commode;Shower seat;Hospital bed      Prior Function Level of Independence: Needs assistance  Gait / Transfers Assistance Needed: short distanced household ambulator using RW or uses wheelchair for mobility  ADL's / Homemaking Assistance Needed: CNA 6 hours/day x 5 days/week        Hand Dominance   Dominant Hand: Right    Extremity/Trunk Assessment   Upper  Extremity Assessment Upper Extremity Assessment: Generalized weakness    Lower Extremity Assessment Lower Extremity Assessment: Generalized weakness    Cervical / Trunk Assessment Cervical / Trunk Assessment: Normal  Communication   Communication: No difficulties  Cognition Arousal/Alertness: Awake/alert Behavior During Therapy: Impulsive;Restless Overall Cognitive Status: No family/caregiver present to determine baseline cognitive functioning                                        General Comments      Exercises     Assessment/Plan    PT Assessment Patient needs continued PT services  PT Problem List Decreased strength;Decreased activity tolerance;Decreased balance;Decreased mobility       PT Treatment Interventions Balance training;Gait training;Stair training;Functional mobility training;Therapeutic activities;Therapeutic exercise;Patient/family education    PT Goals (Current goals can be found in the Care Plan section)  Acute Rehab PT Goals Patient Stated Goal: return home with family to assist PT Goal Formulation: With patient Time For Goal Achievement: 04/11/20 Potential to Achieve Goals: Good    Frequency Min 3X/week   Barriers to discharge        Co-evaluation               AM-PAC PT "6 Clicks" Mobility  Outcome Measure Help needed turning from your back to your side while in a flat bed without using bedrails?: A Lot Help needed moving from lying on your back to sitting on the side of a flat bed without using bedrails?: A Lot Help needed moving to and from a bed to a chair (including a wheelchair)?: A Lot Help needed standing up from a chair using your arms (e.g., wheelchair or bedside chair)?: A Lot Help needed to walk in hospital room?: Total Help needed climbing 3-5 steps with a railing? : Total 6 Click Score: 10    End of Session   Activity Tolerance: Patient tolerated treatment well;Patient limited by fatigue Patient  left: in bed;with call bell/phone within reach;with bed alarm set Nurse Communication: Mobility status PT Visit Diagnosis: Unsteadiness on feet (R26.81);Other abnormalities of gait and mobility (R26.89);Muscle weakness (generalized) (M62.81)    Time: 8889-1694 PT Time Calculation (min) (ACUTE ONLY): 31 min   Charges:   PT Evaluation $PT Eval Moderate Complexity: 1 Mod PT Treatments $Therapeutic Activity: 23-37 mins        3:56 PM, 03/28/20 Lonell Grandchild, MPT Physical Therapist with [facility] 336 [phone] office [phone] mobile phone

## 2020-03-28 NOTE — Progress Notes (Signed)
Patient wanted me to call police/cousin for him. I asked could I do the echo first then we could work on that. He said no. Nurse came in to start new IV and administer ativan. Will attempt echo later.

## 2020-03-28 NOTE — TOC Initial Note (Signed)
Transition of Care [facility]) - Initial/Assessment Note    Patient Details  Name: Lucas [name] MRN: [id number] Date of Birth: [birth year]  [facility]) CM/SW Contact:    Boneta Lucks, RN Phone Number: 03/28/2020, 1:39 PM  Clinical Narrative:       Patient admitted with hypotension. Patient is frustrated, verbal and wanting to leave. TOC spoke both sons. Richard provides most to the information. Patient is at baseline, very stubborn per Delfino Lovett, will fuss to get his way. Patient does have a Charity fundraiser 1 -2 hours a day. Delfino Lovett is working on 24/7 coverage to care for his father. He will refuse SNF and he realizes he needs more care at home. TOC to follow for other discharge plans.             Expected Discharge Plan: [place] Barriers to Discharge: Continued Medical Work up   Patient Goals and CMS Choice Patient states their goals for this hospitalization and ongoing recovery are:: to go home. CMS Medicare.gov Compare Post Acute Care list provided to:: Patient Represenative (must comment) Choice offered to / list presented to : Adult Children  Expected Discharge Plan and Services Expected Discharge Plan: [place]      Living arrangements for the past 2 months: Single Family Home                    Prior Living Arrangements/Services Living arrangements for the past 2 months: Single Family Home Lives with:: Self          Need for Family Participation in Patient Care: Yes (Comment) Care giver support system in place?: Yes (comment)   Criminal Activity/Legal Involvement Pertinent to Current Situation/Hospitalization: No - Comment as needed  Activities of Daily Living Home Assistive Devices/Equipment: Cane (specify quad or straight), Wheelchair ADL Screening (condition at time of admission) Patient's cognitive ability adequate to safely complete daily activities?: Yes Is the patient deaf or have difficulty hearing?: No Does  the patient have difficulty seeing, even when wearing glasses/contacts?: No Does the patient have difficulty concentrating, remembering, or making decisions?: No Patient able to express need for assistance with ADLs?: Yes Does the patient have difficulty dressing or bathing?: No Independently performs ADLs?: Yes (appropriate for developmental age) Does the patient have difficulty walking or climbing stairs?: Yes Weakness of Legs: Both Weakness of Arms/Hands: Both  Permission Sought/Granted    Emotional Assessment   Attitude/Demeanor/Rapport: Aggressive (Verbally and/or physically), Complaining   Orientation: : Oriented to Self, Oriented to Place, Oriented to  Time, Oriented to Situation Alcohol / Substance Use: Not Applicable Psych Involvement: No (comment)  Admission diagnosis:  DVT (deep venous thrombosis) (HCC) [I82.409] Hypotension [I95.9] Pain of left lower extremity [M79.605] Hypotension, unspecified hypotension type [I95.9] Patient Active Problem List   Diagnosis Date Noted  . Hypotension 03/27/2020  . Left leg pain 03/27/2020  . AKI (acute kidney injury) ([place]) 03/27/2020  . Cellulitis 03/27/2020  . Restless leg syndrome 03/27/2020  . Stroke ([place]) 01/25/2017  . Essential hypertension 06/06/2015  . Hyperlipidemia 06/06/2015  . COPD (chronic obstructive pulmonary disease) ([place]) 06/06/2015  . Multiple falls 06/05/2015  . Cerebral ventriculomegaly 01/20/2015  . Facial droop 01/19/2015  . Tobacco abuse 01/19/2015  . Obesity 01/19/2015  . Depression with anxiety 01/19/2015  . Hyperglycemia 01/19/2015  . Dysarthria 01/15/2015  . Parkinson's disease ([place])    PCP:  Zhou-Talbert, [name], MD Pharmacy:   [id number] [place], [place]  [address] [place] [address] Phone: [phone] Fax: [phone]   Readmission Risk Interventions Readmission Risk Prevention Plan 03/28/2020  Transportation Screening Complete  Home Care Screening  Complete  Medication Review (RN CM) Complete  Some recent data might be hidden

## 2020-03-28 NOTE — Progress Notes (Signed)
Patient refusing to PO medications. Patient also refusing to be cleaned up. Will retry in 30 minutes

## 2020-03-28 NOTE — Plan of Care (Signed)
  Problem: Acute Rehab PT Goals(only PT should resolve) Goal: Pt Will Go Supine/Side To Sit Outcome: Progressing Flowsheets (Taken 03/28/2020 1557) Pt will go Supine/Side to Sit:  with minimal assist  with moderate assist Goal: Patient Will Transfer Sit To/From Stand Outcome: Progressing Flowsheets (Taken 03/28/2020 1557) Patient will transfer sit to/from stand:  with minimal assist  with moderate assist Goal: Pt Will Transfer Bed To Chair/Chair To Bed Outcome: Progressing Flowsheets (Taken 03/28/2020 1557) Pt will Transfer Bed to Chair/Chair to Bed: with mod assist Goal: Pt Will Ambulate Outcome: Progressing Flowsheets (Taken 03/28/2020 1557) Pt will Ambulate:  15 feet  with moderate assist  with rolling walker   3:58 PM, 03/28/20 Lonell Grandchild, MPT Physical Therapist with [facility] 336 [phone] office [phone] mobile phone

## 2020-03-28 NOTE — Progress Notes (Signed)
PROGRESS NOTE  Lucas [name]:[id number] DOB: 08-08-1946 DOA: 03/26/2020 PCP: Alfonse Flavors, MD  Brief History:  74 year old male with a history of stroke, alcohol abuse, COPD, hypertension, hyperlipidemia, Parkinson's disease, tobacco abuse, depression/anxiety presenting with worsening left lower extremity leg pain and edema. Review of the medical record shows that the patient has had chronic lower extremity/pedal edema dating back to February 2019 when he was evaluated by Dr. Blanchie Serve.  Nevertheless, the patient stated that his left lower extremity pain and edema had worsened in last 24 hours.  EMS was activated.  The patient was noted to be hypotensive by EMS.  He remained hypotensive in the emergency department.  He denies any recent injury or trauma to his extremity.  He last took his anti-HTN on 03/26/20. He denied any fevers, chills, chest pain, shortness breath, cough, hemoptysis, nausea, vomiting or diarrhea, abdominal pain. He continues to smoke 5 cigs/day.  His last beer was one week prior to admission.  He has been taking motrin for his leg pain for the past week.  In the emergency department, the patient was afebrile but was hypotensive.  Patient was given 2 L normal saline with relative improvement of his blood pressure.  WBC was 7.8 with lactic acid peaking at 1.3.  BMP was 30.0.  D-dimer was 0.63.  Serum creatinine 1.58.  Urinalysis was negative for pyuria.  Chest x-ray was negative.  CTA chest was negative for PE but showed a focal nonopacification of the left subclavian artery with distal opacification.  Blood cultures were obtained and the patient was started on empiric ceftriaxone.  Assessment/Plan: Hypotension -Remain concerned about underlying infectious cause although the patient has normal lactic acid -Check procalcitonin <3.14 -Certainly the patient's antihypertensive medications as well as dysautonomia from his Parkinson's disease may be  contributing -Follow-up TSH and am cortisol--within normal limits -disContinue Solu-Cortef -Continue IV fluids--change to LR>>>saline lock -Holding lisinopril/HCTZ/furosemide -Echocardiogram--pending -Personally reviewed EKG--sinus rhythm, nonspecific T wave changes -weaned off levophed -discontinue empiric vanc/cefepime and monitor  Agitation/Aggressive Behavior -patient want to leave and cursing staff and showing aggressive behavior -He has capacity to make decisions  -I discussed the AMA process, and he refuses to sign AMA -I spoke with both of his sons Ronalee [place] and [place]) regarding medical plan to monitor off abx and steroids x 24 hours and if BP remains stable and cultures neg and he remains afebrile, he may d/c home 8/3 with which they both agreed.  They stated they will not come to hospital to pick him up until pt is medically cleared for d/c -ativan prn agitation  Lower extremity edema and pain -Venous duplex--neg -Urine protein creatinine ratio--0.02  Acute kidney injury -Baseline creatinine 0.9-1.1 -Secondary to volume depletion and hemodynamic changes with NSAID contribution -Presented with serum creatinine 1.58 -improved with IVF  Nonopacification of left subclavian -case discussed with radiologist, Dr. Darene Lamer. [place]-->likely represents atherosclerotic plaque, not hemodynamically significant  COPD -Stable on room air -Continue bronchodilators  Hyperlipidemia -Continue statin  Alcohol and substance abuse -Alcohol level negative -Urine drug screen--neg  Parkinson's disease -Continue ropinirole  Essential hypertension -Holding antihypertensive medications as discussed above      Status is: Inpatient  The patient will require care spanning > 2 midnights and should be moved to inpatient because: IV treatments appropriate due to intensity of illness or inability to take PO  Dispo: The patient is from: Home  Anticipated d/c is to:  Home  Anticipated d/c  date is: 03/28/20  Patient currently is not medically stable to d/c.     Total time spent 35 minutes.  Greater than 50% spent face to face counseling and coordinating care.     Family Communication:   updated sons 8/2  Consultants:  none  Code Status:  FULL   DVT Prophylaxis:  [place] Heparin   Procedures: As Listed in Progress Note Above  Antibiotics: Ceftriaxone 8/1 Vanco 8/1>>>8/2 Cefepime 8/1>>>8/2   Subjective: Patient denies cp, sob, n/v.  He is irate and cursing throughout most of conversation.    Objective: Vitals:   03/28/20 0800 03/28/20 0911 03/28/20 0911 03/28/20 1144  BP: (!) 146/43  (!) 124/107 (!) 121/107  Pulse: 59  56   Resp: 18  21   Temp: 98.1 F (36.7 C) 98 F (36.7 C) 98 F (36.7 C)   TempSrc: Oral Oral Oral   SpO2: 96%  100%   Weight:      Height:        Intake/Output Summary (Last 24 hours) at 03/28/2020 1249 Last data filed at 03/28/2020 9480 Gross per 24 hour  Intake 4476.89 ml  Output 200 ml  Net 4276.89 ml   Weight change: 1.281 kg Exam:   General:  Pt is alert, follows commands appropriately, not in acute distress  HEENT: No icterus,  [place]/AT  Cardiovascular: pt refused exam  Respiratory: pt refused exam  Abdomen: refused exam  Extremities: 1+LE edema, No lymphangitis, No petechiae, No rashes   Data Reviewed: I have personally reviewed following labs and imaging studies Basic Metabolic Panel: Recent Labs  Lab 03/26/20 2354 03/28/20 0451  NA 141 142  K 3.6 3.6  CL 107 110  CO2 26 25  GLUCOSE 116* 127*  BUN 33* 22  CREATININE 1.58* 1.18  CALCIUM 8.3* 8.0*  MG  --  1.8  PHOS  --  2.4*   Liver Function Tests: Recent Labs  Lab 03/27/20 0613 03/28/20 0451  AST 14* 16  ALT 8 19  ALKPHOS 64 64  BILITOT 0.9 0.8  PROT 5.5* 5.5*  ALBUMIN 3.1* 3.0*   No results for input(s): LIPASE, AMYLASE in the last 168 hours. No results for input(s): AMMONIA in the  last 168 hours. Coagulation Profile: Recent Labs  Lab 03/28/20 0451  INR 1.1   CBC: Recent Labs  Lab 03/26/20 2354 03/28/20 0451  WBC 7.8 11.2*  HGB 13.3 13.2  HCT 39.6 40.0  MCV 95.4 96.2  PLT 190 173   Cardiac Enzymes: No results for input(s): CKTOTAL, CKMB, CKMBINDEX, TROPONINI in the last 168 hours. BNP: Invalid input(s): POCBNP CBG: No results for input(s): GLUCAP in the last 168 hours. HbA1C: No results for input(s): HGBA1C in the last 72 hours. Urine analysis:    Component Value Date/Time   COLORURINE YELLOW 03/27/2020 0420   APPEARANCEUR CLEAR 03/27/2020 0420   LABSPEC 1.035 (H) 03/27/2020 0420   PHURINE 5.0 03/27/2020 0420   GLUCOSEU NEGATIVE 03/27/2020 0420   HGBUR NEGATIVE 03/27/2020 0420   BILIRUBINUR NEGATIVE 03/27/2020 0420   KETONESUR NEGATIVE 03/27/2020 0420   PROTEINUR NEGATIVE 03/27/2020 0420   UROBILINOGEN 0.2 06/05/2015 1821   NITRITE NEGATIVE 03/27/2020 0420   LEUKOCYTESUR NEGATIVE 03/27/2020 0420   Sepsis Labs: @LABRCNTIP (procalcitonin:4,lacticidven:4) ) Recent Results (from the past 240 hour(s))  Blood culture (routine x 2)     Status: None (Preliminary result)   Collection Time: 03/26/20 11:54 PM   Specimen: Left Antecubital; Blood  Result Value Ref Range Status   Specimen Description LEFT  ANTECUBITAL  Final   Special Requests NONE  Final   Culture   Final    NO GROWTH 1 DAY Performed at [facility], [address], [place], [place] [address]    Report Status PENDING  Incomplete  Respiratory Panel by RT PCR (Flu A&B, Covid) - Nasopharyngeal Swab     Status: None   Collection Time: 03/27/20  5:55 AM   Specimen: Nasopharyngeal Swab  Result Value Ref Range Status   SARS Coronavirus 2 by RT PCR NEGATIVE NEGATIVE Final    Comment: (NOTE) SARS-CoV-2 target nucleic acids are NOT DETECTED.  The SARS-CoV-2 RNA is generally detectable in upper respiratoy specimens during the acute phase of infection. The lowest concentration of  SARS-CoV-2 viral copies this assay can detect is 131 copies/mL. A negative result does not preclude SARS-Cov-2 infection and should not be used as the sole basis for treatment or other patient management decisions. A negative result may occur with  improper specimen collection/handling, submission of specimen other than nasopharyngeal swab, presence of viral mutation(s) within the areas targeted by this assay, and inadequate number of viral copies (<131 copies/mL). A negative result must be combined with clinical observations, patient history, and epidemiological information. The expected result is Negative.  Fact Sheet for Patients:  PinkCheek.be  Fact Sheet for Healthcare Providers:  GravelBags.it  This test is no t yet approved or cleared by the [place] FDA and  has been authorized for detection and/or diagnosis of SARS-CoV-2 by FDA under an Emergency Use Authorization (EUA). This EUA will remain  in effect (meaning this test can be used) for the duration of the COVID-19 declaration under Section 564(b)(1) of the Act, 21 U.S.C. section 360bbb-3(b)(1), unless the authorization is terminated or revoked sooner.     Influenza A by PCR NEGATIVE NEGATIVE Final   Influenza B by PCR NEGATIVE NEGATIVE Final    Comment: (NOTE) The Xpert Xpress SARS-CoV-2/FLU/RSV assay is intended as an aid in  the diagnosis of influenza from Nasopharyngeal swab specimens and  should not be used as a sole basis for treatment. Nasal washings and  aspirates are unacceptable for Xpert Xpress SARS-CoV-2/FLU/RSV  testing.  Fact Sheet for Patients: PinkCheek.be  Fact Sheet for Healthcare Providers: GravelBags.it  This test is not yet approved or cleared by the [place] FDA and  has been authorized for detection and/or diagnosis of SARS-CoV-2 by  FDA under an Emergency Use  Authorization (EUA). This EUA will remain  in effect (meaning this test can be used) for the duration of the  Covid-19 declaration under Section 564(b)(1) of the Act, 21  U.S.C. section 360bbb-3(b)(1), unless the authorization is  terminated or revoked. Performed at [facility], [address] [place], [place] [address]   Blood culture (routine x 2)     Status: None (Preliminary result)   Collection Time: 03/27/20  6:13 AM   Specimen: BLOOD LEFT ARM  Result Value Ref Range Status   Specimen Description BLOOD LEFT ARM  Final   Special Requests NONE  Final   Culture   Final    NO GROWTH 1 DAY Performed at [facility], [address] [place], [place] [address]    Report Status PENDING  Incomplete  MRSA PCR Screening     Status: None   Collection Time: 03/27/20  9:38 PM   Specimen: Nasal Mucosa; Nasopharyngeal  Result Value Ref Range Status   MRSA by PCR NEGATIVE NEGATIVE Final    Comment:  The GeneXpert MRSA Assay (FDA approved for NASAL specimens only), is one component of a comprehensive MRSA colonization surveillance program. It is not intended to diagnose MRSA infection nor to guide or monitor treatment for MRSA infections. Performed at [facility], [address] [place], [place] [address]      Scheduled Meds: . atorvastatin  10 mg Oral QPM  . Chlorhexidine Gluconate Cloth  6 each Topical Daily  . heparin  5,000 Units Subcutaneous Q8H  . ipratropium-albuterol  3 mL Nebulization Q6H  . LORazepam  0.5 mg Intravenous Once  . nicotine  21 mg Transdermal Daily  . rOPINIRole  3 mg Oral TID   Continuous Infusions: . sodium chloride      Procedures/Studies: DG Chest 2 View  Result Date: 03/27/2020 CLINICAL DATA:  Hypotension and peripheral swelling EXAM: CHEST - 2 VIEW COMPARISON:  12/21/2019 FINDINGS: Cardiac shadow is stable but enlarged. The lungs are clear. No acute effusion or infiltrate is noted. No bony abnormality is seen. IMPRESSION: No acute  abnormality noted. Electronically Signed   By: Inez Catalina M.D.   On: 03/27/2020 02:33   CT Angio Chest PE W and/or Wo Contrast  Result Date: 03/27/2020 CLINICAL DATA:  Elevated D-dimer and swollen feet EXAM: CT ANGIOGRAPHY CHEST WITH CONTRAST TECHNIQUE: Multidetector CT imaging of the chest was performed using the standard protocol during bolus administration of intravenous contrast. Multiplanar CT image reconstructions and MIPs were obtained to evaluate the vascular anatomy. CONTRAST:  24mL OMNIPAQUE IOHEXOL 350 MG/ML SOLN COMPARISON:  None. FINDINGS: Cardiovascular: There is a optimal opacification of the pulmonary arteries. There is no central,segmental, or subsegmental filling defects within the pulmonary arteries. There is unchanged cardiomegaly. No pericardial effusion or thickening. No evidence right heart strain. There is calcifications seen at the origin of the great vessels. There appears to be a non opacification of the left subclavian artery, best seen on series 4, image 19 the distal portion does appear to be partially opacified. Scattered aortic atherosclerosis is noted. Coronary artery calcifications are seen. Mediastinum/Nodes: No hilar, mediastinal, or axillary adenopathy. Thyroid gland, trachea, and esophagus demonstrate no significant findings. Lungs/Pleura: Streaky atelectasis seen at both lung bases. No large airspace consolidation or pleural effusion. No pleural effusion or pneumothorax. No airspace consolidation. Upper Abdomen: Layering calcified gallstones are present. Musculoskeletal: No chest wall abnormality. No acute or significant osseous findings. Review of the MIP images confirms the above findings. IMPRESSION: No central, segmental, or subsegmental pulmonary embolism. Bibasilar subsegmental atelectasis. There is question of a focal non opacification of the left subclavian artery, which could be due to a focal thrombus, however the distal portion is opacified. Cholelithiasis  Aortic Atherosclerosis (ICD10-I70.0). Electronically Signed   By: Prudencio Pair M.D.   On: 03/27/2020 02:41   US Venous Img Lower Bilateral (DVT)  Result Date: 03/27/2020 CLINICAL DATA:  Bilateral lower extremity edema for the past week. Evaluate for DVT. EXAM: BILATERAL LOWER EXTREMITY VENOUS DOPPLER ULTRASOUND TECHNIQUE: Gray-scale sonography with graded compression, as well as color Doppler and duplex ultrasound were performed to evaluate the lower extremity deep venous systems from the level of the common femoral vein and including the common femoral, femoral, profunda femoral, popliteal and calf veins including the posterior tibial, peroneal and gastrocnemius veins when visible. The superficial great saphenous vein was also interrogated. Spectral Doppler was utilized to evaluate flow at rest and with distal augmentation maneuvers in the common femoral, femoral and popliteal veins. COMPARISON:  None. FINDINGS: RIGHT LOWER EXTREMITY Common Femoral Vein: No evidence  of thrombus. Normal compressibility, respiratory phasicity and response to augmentation. Saphenofemoral Junction: No evidence of thrombus. Normal compressibility and flow on color Doppler imaging. Profunda Femoral Vein: No evidence of thrombus. Normal compressibility and flow on color Doppler imaging. Femoral Vein: No evidence of thrombus. Normal compressibility, respiratory phasicity and response to augmentation. Popliteal Vein: No evidence of thrombus. Normal compressibility, respiratory phasicity and response to augmentation. Calf Veins: No evidence of thrombus. Normal compressibility and flow on color Doppler imaging. Superficial Great Saphenous Vein: No evidence of thrombus. Normal compressibility. Venous Reflux:  None. Other Findings: There is a minimal amount of subcutaneous edema at the level the right calf. LEFT LOWER EXTREMITY Common Femoral Vein: No evidence of thrombus. Normal compressibility, respiratory phasicity and response to  augmentation. Saphenofemoral Junction: No evidence of thrombus. Normal compressibility and flow on color Doppler imaging. Profunda Femoral Vein: No evidence of thrombus. Normal compressibility and flow on color Doppler imaging. Femoral Vein: No evidence of thrombus. Normal compressibility, respiratory phasicity and response to augmentation. Popliteal Vein: No evidence of thrombus. Normal compressibility, respiratory phasicity and response to augmentation. Calf Veins: No evidence of thrombus. Normal compressibility and flow on color Doppler imaging. Superficial Great Saphenous Vein: No evidence of thrombus. Normal compressibility. Venous Reflux:  None. Other Findings:  None. IMPRESSION: No evidence of DVT within either lower extremity. Electronically Signed   By: Sandi Mariscal M.D.   On: 03/27/2020 11:20    Orson Eva, DO  [facility]  If 7PM-7AM, please contact night-coverage [email or web address] Password [email or web address] 03/28/2020, 12:49 PM   LOS: 1 day

## 2020-03-28 NOTE — Progress Notes (Signed)
Patient removed IV. AC called to attempt to place IV. AC came to floor went to patients room to start IV. Patient refused to let [facility] start IV. MD made aware.

## 2020-03-28 NOTE — Progress Notes (Signed)
Gave night shift  Nurse (tamika RN)  report at bedside. Patient had spilled drink on himself and covers patient stated he had soiled himself also. This nurse attempted to clean patient up with night shift nurse at bedside. Patient refused stating he wasn't to be seen like this. Educated patient that we need to clean him so he wont get bedsores from sitting on soiled linens. Patient refused. Night shift nurse will attempt to clean patient at a later time.

## 2020-03-29 ENCOUNTER — Inpatient Hospital Stay (HOSPITAL_COMMUNITY): Payer: Medicare HMO

## 2020-03-29 DIAGNOSIS — I5031 Acute diastolic (congestive) heart failure: Secondary | ICD-10-CM

## 2020-03-29 LAB — ECHOCARDIOGRAM COMPLETE
Area-P 1/2: 3.91 cm2
Height: 68 in
S' Lateral: 2.35 cm
Weight: 3361.57 oz

## 2020-03-29 NOTE — Plan of Care (Signed)

## 2020-03-29 NOTE — Discharge Summary (Signed)
Physician Discharge Summary  Lucas [name] NGE:[id number] DOB: 1945-10-19 DOA: 03/26/2020  PCP: Alfonse Flavors, MD  Admit date: 03/26/2020 Discharge date: 03/29/2020  Admitted From: Home Disposition:  Home--refuses SNF  Recommendations for Outpatient Follow-up:  1. Follow up with PCP in 1-2 weeks 2. Please obtain BMP/CBC in one week  Home Health: [facility]   Discharge Condition: Stable CODE STATUS FULL Diet recommendation: Heart Healthy   Brief/Interim Summary: 74 year old male with a history of stroke, alcohol abuse, COPD, hypertension, hyperlipidemia, Parkinson's disease, tobacco abuse, depression/anxiety presenting with worsening left lower extremity leg pain and edema. Review of the medical record shows that the patient has had chronic lower extremity/pedal edema dating back to February 2019 when he was evaluated by Cristal Deer.Nevertheless, the patient stated that his left lower extremity pain and edema had worsened in last 24 hours.EMS was activated. The patient was noted to be hypotensive by EMS. He remained hypotensive in the emergency department. He denies any recent injury or trauma to his extremity. He last took his anti-HTN on 03/26/20. He denied any fevers, chills, chest pain, shortness breath,cough, hemoptysis,nausea, vomiting or diarrhea, abdominal pain. He continues to smoke 5 cigs/day. His last beer was one week prior to admission. He has been taking motrin for his leg pain for the past week.  In the emergency department, the patient was afebrile but was hypotensive. Patient was given 2 L normal saline with relative improvement of his blood pressure. WBC was 7.8 with lactic acid peaking at 1.3. BMP was 30.0. D-dimer was 0.63. Serum creatinine 1.58. Urinalysis was negative for pyuria. Chest x-ray was negative. CTA chest was negative for PE but showed a focal nonopacification of the left subclavian artery with distal opacification. Blood  cultures were obtained and the patient was started on empiric ceftriaxone initially although this was broadened to vancomycin and cefepime.  Once his cultures remain negative, his antibiotics were discontinued.  The patient was monitored off antibiotics and steroids for 24 hours.  He remained hemodynamically stable and afebrile.  During the hospitalization, the patient exhibited progressive, belligerent behavior.  He often refused to take medications until his diabetes.  He also exhibited depressed nursing staff. This was discussed with the patient he cannot multiloculation.  Ultimately, the family agreed that he needed to stay in the hospital until he was medically stable for discharge.   Discharge Diagnoses:  Hypotension -Initially Concerned about possible infectious process -Check procalcitonin <4.01 -Certainly the patient's antihypertensive medications as well as dysautonomia from his Parkinson's disease may be contributing -Follow-up TSH and am cortisol--within normal limits -disContinue Solu-Cortef -Continue IV fluids--change to LR>>>saline lock -Holding lisinopril/HCTZ/furosemide -Echocardiogram--pending -Personally reviewed EKG--sinus rhythm, nonspecific T wave changes -weaned off levophed -discontinue empiric vanc/cefepime and stress steroids and monitor -The patient remained clinically stable off of fluids, antibiotics, steroids.  Agitation/Aggressive Behavior -patient want to leave and cursing staff and showing aggressive behavior -He has capacity to make decisions  -I discussed the AMA process, and he refuses to sign AMA -I spoke with both of his sons Ronalee Belts and [place]) regarding medical plan to monitor off abx and steroids x 24 hours and if BP remains stable and cultures neg and he remains afebrile, he may d/c home 8/3 with which they both agreed.  They stated they will not come to hospital to pick him up until pt is medically cleared for d/c -ativan prn agitation  Lower  extremity edema and pain -Venous duplex--neg -Urine protein creatinine ratio--0.02  Acute kidney injury -Baseline creatinine 0.9-1.1 -Secondary  to volume depletion and hemodynamic changeswith NSAID contribution -Presented with serum creatinine 1.58 -improved with IVF -Serum creatinine 1.18 at time of d/c  Nonopacification of left subclavian -case discussed with radiologist, Dr. Darene Lamer. Lawrence-->likely represents atherosclerotic plaque, not hemodynamically significant  COPD -Stable on room air -Continue bronchodilators  Hyperlipidemia -Continue statin  Alcohol and substance abuse -Alcohol level negative -Urine drug screen--neg  Parkinson's disease -Continue ropinirole  Essential hypertension -Holding antihypertensive medications as discussed above     Discharge Instructions   Allergies as of 03/29/2020   No Known Allergies     Medication List    TAKE these medications   albuterol 108 (90 Base) MCG/ACT inhaler Commonly known as: VENTOLIN HFA Inhale 1 puff into the lungs every 4 (four) hours as needed for wheezing or shortness of breath.   aspirin 81 MG EC tablet Take 1 tablet (81 mg total) by mouth daily.   atorvastatin 10 MG tablet Commonly known as: LIPITOR Take 10 mg by mouth every evening.   carbidopa-levodopa 25-100 MG tablet Commonly known as: SINEMET IR Take 1 tablet by mouth 3 (three) times daily.   clonazePAM 1 MG tablet Commonly known as: KLONOPIN Take 1 mg by mouth daily as needed.   Dermacloud Crea Apply 1 application topically daily.   FAMOTIDINE PO Take 1 tablet by mouth daily.   furosemide 20 MG tablet Commonly known as: LASIX Take 20 mg by mouth daily.   hydrochlorothiazide 25 MG tablet Commonly known as: HYDRODIURIL Take 25 mg by mouth daily.   ipratropium-albuterol 0.5-2.5 (3) MG/3ML Soln Commonly known as: DUONEB SMARTSIG:3 Milliliter(s) Via Nebulizer Every 6 Hours   lisinopril 20 MG tablet Commonly known as:  ZESTRIL Take 20 mg by mouth daily.   PARoxetine 40 MG tablet Commonly known as: PAXIL Take 60 mg by mouth every morning.   rOPINIRole 3 MG tablet Commonly known as: REQUIP Take 3 mg by mouth in the morning, at noon, and at bedtime.       No Known Allergies  Consultations:  none   Procedures/Studies: DG Chest 2 View  Result Date: 03/27/2020 CLINICAL DATA:  Hypotension and peripheral swelling EXAM: CHEST - 2 VIEW COMPARISON:  12/21/2019 FINDINGS: Cardiac shadow is stable but enlarged. The lungs are clear. No acute effusion or infiltrate is noted. No bony abnormality is seen. IMPRESSION: No acute abnormality noted. Electronically Signed   By: Inez Catalina M.D.   On: 03/27/2020 02:33   CT Angio Chest PE W and/or Wo Contrast  Result Date: 03/27/2020 CLINICAL DATA:  Elevated D-dimer and swollen feet EXAM: CT ANGIOGRAPHY CHEST WITH CONTRAST TECHNIQUE: Multidetector CT imaging of the chest was performed using the standard protocol during bolus administration of intravenous contrast. Multiplanar CT image reconstructions and MIPs were obtained to evaluate the vascular anatomy. CONTRAST:  78mL OMNIPAQUE IOHEXOL 350 MG/ML SOLN COMPARISON:  None. FINDINGS: Cardiovascular: There is a optimal opacification of the pulmonary arteries. There is no central,segmental, or subsegmental filling defects within the pulmonary arteries. There is unchanged cardiomegaly. No pericardial effusion or thickening. No evidence right heart strain. There is calcifications seen at the origin of the great vessels. There appears to be a non opacification of the left subclavian artery, best seen on series 4, image 19 the distal portion does appear to be partially opacified. Scattered aortic atherosclerosis is noted. Coronary artery calcifications are seen. Mediastinum/Nodes: No hilar, mediastinal, or axillary adenopathy. Thyroid gland, trachea, and esophagus demonstrate no significant findings. Lungs/Pleura: Streaky atelectasis  seen at both lung bases. No large  airspace consolidation or pleural effusion. No pleural effusion or pneumothorax. No airspace consolidation. Upper Abdomen: Layering calcified gallstones are present. Musculoskeletal: No chest wall abnormality. No acute or significant osseous findings. Review of the MIP images confirms the above findings. IMPRESSION: No central, segmental, or subsegmental pulmonary embolism. Bibasilar subsegmental atelectasis. There is question of a focal non opacification of the left subclavian artery, which could be due to a focal thrombus, however the distal portion is opacified. Cholelithiasis Aortic Atherosclerosis (ICD10-I70.0). Electronically Signed   By: Prudencio Pair M.D.   On: 03/27/2020 02:41   US Venous Img Lower Bilateral (DVT)  Result Date: 03/27/2020 CLINICAL DATA:  Bilateral lower extremity edema for the past week. Evaluate for DVT. EXAM: BILATERAL LOWER EXTREMITY VENOUS DOPPLER ULTRASOUND TECHNIQUE: Gray-scale sonography with graded compression, as well as color Doppler and duplex ultrasound were performed to evaluate the lower extremity deep venous systems from the level of the common femoral vein and including the common femoral, femoral, profunda femoral, popliteal and calf veins including the posterior tibial, peroneal and gastrocnemius veins when visible. The superficial great saphenous vein was also interrogated. Spectral Doppler was utilized to evaluate flow at rest and with distal augmentation maneuvers in the common femoral, femoral and popliteal veins. COMPARISON:  None. FINDINGS: RIGHT LOWER EXTREMITY Common Femoral Vein: No evidence of thrombus. Normal compressibility, respiratory phasicity and response to augmentation. Saphenofemoral Junction: No evidence of thrombus. Normal compressibility and flow on color Doppler imaging. Profunda Femoral Vein: No evidence of thrombus. Normal compressibility and flow on color Doppler imaging. Femoral Vein: No evidence of thrombus.  Normal compressibility, respiratory phasicity and response to augmentation. Popliteal Vein: No evidence of thrombus. Normal compressibility, respiratory phasicity and response to augmentation. Calf Veins: No evidence of thrombus. Normal compressibility and flow on color Doppler imaging. Superficial Great Saphenous Vein: No evidence of thrombus. Normal compressibility. Venous Reflux:  None. Other Findings: There is a minimal amount of subcutaneous edema at the level the right calf. LEFT LOWER EXTREMITY Common Femoral Vein: No evidence of thrombus. Normal compressibility, respiratory phasicity and response to augmentation. Saphenofemoral Junction: No evidence of thrombus. Normal compressibility and flow on color Doppler imaging. Profunda Femoral Vein: No evidence of thrombus. Normal compressibility and flow on color Doppler imaging. Femoral Vein: No evidence of thrombus. Normal compressibility, respiratory phasicity and response to augmentation. Popliteal Vein: No evidence of thrombus. Normal compressibility, respiratory phasicity and response to augmentation. Calf Veins: No evidence of thrombus. Normal compressibility and flow on color Doppler imaging. Superficial Great Saphenous Vein: No evidence of thrombus. Normal compressibility. Venous Reflux:  None. Other Findings:  None. IMPRESSION: No evidence of DVT within either lower extremity. Electronically Signed   By: Sandi Mariscal M.D.   On: 03/27/2020 11:20         Discharge Exam: Vitals:   03/29/20 0031 03/29/20 0612  BP: (!) 142/64 (!) 174/64  Pulse: 73 77  Resp:  18  Temp: 98.9 F (37.2 C) 98.2 F (36.8 C)  SpO2: 100% 99%   Vitals:   03/28/20 1649 03/28/20 2044 03/29/20 0031 03/29/20 0612  BP: (!) 143/54  (!) 142/64 (!) 174/64  Pulse: 66  73 77  Resp: 18   18  Temp: 98.9 F (37.2 C)  98.9 F (37.2 C) 98.2 F (36.8 C)  TempSrc: Oral   Oral  SpO2: 98% 98% 100% 99%  Weight:      Height:        General: Pt is alert, awake, not in acute  distress Cardiovascular: RRR,  S1/S2 +, no rubs, no gallops Respiratory: bibasilar rales. No wheeze Abdominal: Soft, NT, ND, bowel sounds + Extremities: non pitting edema, no cyanosis   The results of significant diagnostics from this hospitalization (including imaging, microbiology, ancillary and laboratory) are listed below for reference.    Significant Diagnostic Studies: DG Chest 2 View  Result Date: 03/27/2020 CLINICAL DATA:  Hypotension and peripheral swelling EXAM: CHEST - 2 VIEW COMPARISON:  12/21/2019 FINDINGS: Cardiac shadow is stable but enlarged. The lungs are clear. No acute effusion or infiltrate is noted. No bony abnormality is seen. IMPRESSION: No acute abnormality noted. Electronically Signed   By: Inez Catalina M.D.   On: 03/27/2020 02:33   CT Angio Chest PE W and/or Wo Contrast  Result Date: 03/27/2020 CLINICAL DATA:  Elevated D-dimer and swollen feet EXAM: CT ANGIOGRAPHY CHEST WITH CONTRAST TECHNIQUE: Multidetector CT imaging of the chest was performed using the standard protocol during bolus administration of intravenous contrast. Multiplanar CT image reconstructions and MIPs were obtained to evaluate the vascular anatomy. CONTRAST:  68mL OMNIPAQUE IOHEXOL 350 MG/ML SOLN COMPARISON:  None. FINDINGS: Cardiovascular: There is a optimal opacification of the pulmonary arteries. There is no central,segmental, or subsegmental filling defects within the pulmonary arteries. There is unchanged cardiomegaly. No pericardial effusion or thickening. No evidence right heart strain. There is calcifications seen at the origin of the great vessels. There appears to be a non opacification of the left subclavian artery, best seen on series 4, image 19 the distal portion does appear to be partially opacified. Scattered aortic atherosclerosis is noted. Coronary artery calcifications are seen. Mediastinum/Nodes: No hilar, mediastinal, or axillary adenopathy. Thyroid gland, trachea, and esophagus  demonstrate no significant findings. Lungs/Pleura: Streaky atelectasis seen at both lung bases. No large airspace consolidation or pleural effusion. No pleural effusion or pneumothorax. No airspace consolidation. Upper Abdomen: Layering calcified gallstones are present. Musculoskeletal: No chest wall abnormality. No acute or significant osseous findings. Review of the MIP images confirms the above findings. IMPRESSION: No central, segmental, or subsegmental pulmonary embolism. Bibasilar subsegmental atelectasis. There is question of a focal non opacification of the left subclavian artery, which could be due to a focal thrombus, however the distal portion is opacified. Cholelithiasis Aortic Atherosclerosis (ICD10-I70.0). Electronically Signed   By: Prudencio Pair M.D.   On: 03/27/2020 02:41   US Venous Img Lower Bilateral (DVT)  Result Date: 03/27/2020 CLINICAL DATA:  Bilateral lower extremity edema for the past week. Evaluate for DVT. EXAM: BILATERAL LOWER EXTREMITY VENOUS DOPPLER ULTRASOUND TECHNIQUE: Gray-scale sonography with graded compression, as well as color Doppler and duplex ultrasound were performed to evaluate the lower extremity deep venous systems from the level of the common femoral vein and including the common femoral, femoral, profunda femoral, popliteal and calf veins including the posterior tibial, peroneal and gastrocnemius veins when visible. The superficial great saphenous vein was also interrogated. Spectral Doppler was utilized to evaluate flow at rest and with distal augmentation maneuvers in the common femoral, femoral and popliteal veins. COMPARISON:  None. FINDINGS: RIGHT LOWER EXTREMITY Common Femoral Vein: No evidence of thrombus. Normal compressibility, respiratory phasicity and response to augmentation. Saphenofemoral Junction: No evidence of thrombus. Normal compressibility and flow on color Doppler imaging. Profunda Femoral Vein: No evidence of thrombus. Normal compressibility and  flow on color Doppler imaging. Femoral Vein: No evidence of thrombus. Normal compressibility, respiratory phasicity and response to augmentation. Popliteal Vein: No evidence of thrombus. Normal compressibility, respiratory phasicity and response to augmentation. Calf Veins: No evidence of thrombus. Normal  compressibility and flow on color Doppler imaging. Superficial Great Saphenous Vein: No evidence of thrombus. Normal compressibility. Venous Reflux:  None. Other Findings: There is a minimal amount of subcutaneous edema at the level the right calf. LEFT LOWER EXTREMITY Common Femoral Vein: No evidence of thrombus. Normal compressibility, respiratory phasicity and response to augmentation. Saphenofemoral Junction: No evidence of thrombus. Normal compressibility and flow on color Doppler imaging. Profunda Femoral Vein: No evidence of thrombus. Normal compressibility and flow on color Doppler imaging. Femoral Vein: No evidence of thrombus. Normal compressibility, respiratory phasicity and response to augmentation. Popliteal Vein: No evidence of thrombus. Normal compressibility, respiratory phasicity and response to augmentation. Calf Veins: No evidence of thrombus. Normal compressibility and flow on color Doppler imaging. Superficial Great Saphenous Vein: No evidence of thrombus. Normal compressibility. Venous Reflux:  None. Other Findings:  None. IMPRESSION: No evidence of DVT within either lower extremity. Electronically Signed   By: Sandi Mariscal M.D.   On: 03/27/2020 11:20     Microbiology: Recent Results (from the past 240 hour(s))  Blood culture (routine x 2)     Status: None (Preliminary result)   Collection Time: 03/26/20 11:54 PM   Specimen: Left Antecubital; Blood  Result Value Ref Range Status   Specimen Description LEFT ANTECUBITAL  Final   Special Requests   Final    BOTTLES DRAWN AEROBIC AND ANAEROBIC Blood Culture adequate volume   Culture   Final    NO GROWTH 2 DAYS Performed at [facility], [address], [place],  [address]    Report Status PENDING  Incomplete  Respiratory Panel by RT PCR (Flu A&B, Covid) - Nasopharyngeal Swab     Status: None   Collection Time: 03/27/20  5:55 AM   Specimen: Nasopharyngeal Swab  Result Value Ref Range Status   SARS Coronavirus 2 by RT PCR NEGATIVE NEGATIVE Final    Comment: (NOTE) SARS-CoV-2 target nucleic acids are NOT DETECTED.  The SARS-CoV-2 RNA is generally detectable in upper respiratoy specimens during the acute phase of infection. The lowest concentration of SARS-CoV-2 viral copies this assay can detect is 131 copies/mL. A negative result does not preclude SARS-Cov-2 infection and should not be used as the sole basis for treatment or other patient management decisions. A negative result may occur with  improper specimen collection/handling, submission of specimen other than nasopharyngeal swab, presence of viral mutation(s) within the areas targeted by this assay, and inadequate number of viral copies (<131 copies/mL). A negative result must be combined with clinical observations, patient history, and epidemiological information. The expected result is Negative.  Fact Sheet for Patients:  PinkCheek.be  Fact Sheet for Healthcare Providers:  GravelBags.it  This test is no t yet approved or cleared by the [place] FDA and  has been authorized for detection and/or diagnosis of SARS-CoV-2 by FDA under an Emergency Use Authorization (EUA). This EUA will remain  in effect (meaning this test can be used) for the duration of the COVID-19 declaration under Section 564(b)(1) of the Act, 21 U.S.C. section 360bbb-3(b)(1), unless the authorization is terminated or revoked sooner.     Influenza A by PCR NEGATIVE NEGATIVE Final   Influenza B by PCR NEGATIVE NEGATIVE Final    Comment: (NOTE) The Xpert Xpress SARS-CoV-2/FLU/RSV assay is intended as an aid in  the  diagnosis of influenza from Nasopharyngeal swab specimens and  should not be used as a sole basis for treatment. Nasal washings and  aspirates are unacceptable for Xpert Xpress SARS-CoV-2/FLU/RSV  testing.  Fact Sheet for Patients: PinkCheek.be  Fact Sheet for Healthcare Providers: GravelBags.it  This test is not yet approved or cleared by the [place] FDA and  has been authorized for detection and/or diagnosis of SARS-CoV-2 by  FDA under an Emergency Use Authorization (EUA). This EUA will remain  in effect (meaning this test can be used) for the duration of the  Covid-19 declaration under Section 564(b)(1) of the Act, 21  U.S.C. section 360bbb-3(b)(1), unless the authorization is  terminated or revoked. Performed at [facility], [address] [place], [place] [address]   Blood culture (routine x 2)     Status: None (Preliminary result)   Collection Time: 03/27/20  6:13 AM   Specimen: BLOOD LEFT ARM  Result Value Ref Range Status   Specimen Description BLOOD LEFT ARM  Final   Special Requests   Final    BOTTLES DRAWN AEROBIC AND ANAEROBIC Blood Culture adequate volume   Culture   Final    NO GROWTH 2 DAYS Performed at [facility], [address] [place], [place] [address]    Report Status PENDING  Incomplete  MRSA PCR Screening     Status: None   Collection Time: 03/27/20  9:38 PM   Specimen: Nasal Mucosa; Nasopharyngeal  Result Value Ref Range Status   MRSA by PCR NEGATIVE NEGATIVE Final    Comment:        The GeneXpert MRSA Assay (FDA approved for NASAL specimens only), is one component of a comprehensive MRSA colonization surveillance program. It is not intended to diagnose MRSA infection nor to guide or monitor treatment for MRSA infections. Performed at [facility], [address], [place],  [address]      Labs: Basic Metabolic Panel: Recent Labs  Lab 03/26/20 2354 03/28/20 0451  NA  141 142  K 3.6 3.6  CL 107 110  CO2 26 25  GLUCOSE 116* 127*  BUN 33* 22  CREATININE 1.58* 1.18  CALCIUM 8.3* 8.0*  MG  --  1.8  PHOS  --  2.4*   Liver Function Tests: Recent Labs  Lab 03/27/20 0613 03/28/20 0451  AST 14* 16  ALT 8 19  ALKPHOS 64 64  BILITOT 0.9 0.8  PROT 5.5* 5.5*  ALBUMIN 3.1* 3.0*   No results for input(s): LIPASE, AMYLASE in the last 168 hours. No results for input(s): AMMONIA in the last 168 hours. CBC: Recent Labs  Lab 03/26/20 2354 03/28/20 0451  WBC 7.8 11.2*  HGB 13.3 13.2  HCT 39.6 40.0  MCV 95.4 96.2  PLT 190 173   Cardiac Enzymes: No results for input(s): CKTOTAL, CKMB, CKMBINDEX, TROPONINI in the last 168 hours. BNP: Invalid input(s): POCBNP CBG: No results for input(s): GLUCAP in the last 168 hours.  Time coordinating discharge:  36 minutes  Signed:  Orson Eva, DO [facility] Pager: [phone] 03/29/2020, 8:32 AM

## 2020-03-29 NOTE — Discharge Instructions (Signed)
Hypotension As your heart beats, it forces blood through your body. This force is called blood pressure. If you have hypotension, you have low blood pressure. When your blood pressure is too low, you may not get enough blood to your brain or other parts of your body. This may cause you to feel weak, light-headed, have a fast heartbeat, or even pass out (faint). Low blood pressure may be harmless, or it may cause serious problems. What are the causes?  Blood loss.  Not enough water in the body (dehydration).  Heart problems.  Hormone problems.  Pregnancy.  A very bad infection.  Not having enough of certain nutrients.  Very bad allergic reactions.  Certain medicines. What increases the risk?  Age. The risk increases as you get older.  Conditions that affect the heart or the brain and spinal cord (central nervous system).  Taking certain medicines.  Being pregnant. What are the signs or symptoms?  Feeling: ? Weak. ? Light-headed. ? Dizzy. ? Tired (fatigued).  Blurred vision.  Fast heartbeat.  Passing out, in very bad cases. How is this treated?  Changing your diet. This may involve eating more salt (sodium) or drinking more water.  Taking medicines to raise your blood pressure.  Changing how much you take (the dosage) of some of your medicines.  Wearing compression stockings. These stockings help to prevent blood clots and reduce swelling in your legs. In some cases, you may need to go to the hospital for:  Fluid replacement. This means you will receive fluids through an IV tube.  Blood replacement. This means you will receive donated blood through an IV tube (transfusion).  Treating an infection or heart problems, if this applies.  Monitoring. You may need to be monitored while medicines that you are taking wear off. Follow these instructions at home: Eating and drinking   Drink enough fluids to keep your pee (urine) pale yellow.  Eat a healthy diet.  Follow instructions from your doctor about what you can eat or drink. A healthy diet includes: ? Fresh fruits and vegetables. ? Whole grains. ? Low-fat (lean) meats. ? Low-fat dairy products.  Eat extra salt only as told. Do not add extra salt to your diet unless your doctor tells you to.  Eat small meals often.  Avoid standing up quickly after you eat. Medicines  Take over-the-counter and prescription medicines only as told by your doctor. ? Follow instructions from your doctor about changing how much you take of your medicines, if this applies. ? Do not stop or change any of your medicines on your own. General instructions   Wear compression stockings as told by your doctor.  Get up slowly from lying down or sitting.  Avoid hot showers and a lot of heat as told by your doctor.  Return to your normal activities as told by your doctor. Ask what activities are safe for you.  Do not use any products that contain nicotine or tobacco, such as cigarettes, e-cigarettes, and chewing tobacco. If you need help quitting, ask your doctor.  Keep all follow-up visits as told by your doctor. This is important. Contact a doctor if:  You throw up (vomit).  You have watery poop (diarrhea).  You have a fever for more than 2-3 days.  You feel more thirsty than normal.  You feel weak and tired. Get help right away if:  You have chest pain.  You have a fast or uneven heartbeat.  You lose feeling (have numbness) in any   part of your body.  You cannot move your arms or your legs.  You have trouble talking.  You get sweaty or feel light-headed.  You pass out.  You have trouble breathing.  You have trouble staying awake.  You feel mixed up (confused). Summary  Hypotension is also called low blood pressure. It is when the force of blood pumping through your arteries is too weak.  Hypotension may be harmless, or it may cause serious problems.  Treatment may include changing  your diet and medicines, and wearing compression stockings.  In very bad cases, you may need to go to the hospital. This information is not intended to replace advice given to you by your health care provider. Make sure you discuss any questions you have with your health care provider. Document Revised: 02/06/2018 Document Reviewed: 02/06/2018 Elsevier Patient Education  2020 Elsevier Inc.  

## 2020-03-29 NOTE — Progress Notes (Signed)
*  PRELIMINARY RESULTS* Echocardiogram 2D Echocardiogram has been performed.  Leavy Cella 03/29/2020, 9:30 AM

## 2020-04-01 LAB — CULTURE, BLOOD (ROUTINE X 2)
Culture: NO GROWTH
Culture: NO GROWTH
Special Requests: ADEQUATE
Special Requests: ADEQUATE

## 2020-05-09 ENCOUNTER — Other Ambulatory Visit: Payer: Self-pay

## 2020-05-09 ENCOUNTER — Emergency Department (HOSPITAL_COMMUNITY)
Admission: EM | Admit: 2020-05-09 | Discharge: 2020-05-09 | Disposition: A | Discharge status: Home / Self Care | Payer: Medicare HMO | Attending: Emergency Medicine | Admitting: Emergency Medicine

## 2020-05-09 ENCOUNTER — Encounter (HOSPITAL_COMMUNITY): Payer: Self-pay | Admitting: Emergency Medicine

## 2020-05-09 DIAGNOSIS — G20 Parkinson's disease: Secondary | ICD-10-CM | POA: Diagnosis not present

## 2020-05-09 DIAGNOSIS — J449 Chronic obstructive pulmonary disease, unspecified: Secondary | ICD-10-CM | POA: Diagnosis not present

## 2020-05-09 DIAGNOSIS — I959 Hypotension, unspecified: Secondary | ICD-10-CM | POA: Diagnosis not present

## 2020-05-09 DIAGNOSIS — Z7982 Long term (current) use of aspirin: Secondary | ICD-10-CM | POA: Insufficient documentation

## 2020-05-09 DIAGNOSIS — W07XXXA Fall from chair, initial encounter: Secondary | ICD-10-CM | POA: Diagnosis not present

## 2020-05-09 DIAGNOSIS — Z79899 Other long term (current) drug therapy: Secondary | ICD-10-CM | POA: Insufficient documentation

## 2020-05-09 DIAGNOSIS — F17210 Nicotine dependence, cigarettes, uncomplicated: Secondary | ICD-10-CM | POA: Insufficient documentation

## 2020-05-09 DIAGNOSIS — W19XXXA Unspecified fall, initial encounter: Secondary | ICD-10-CM

## 2020-05-09 DIAGNOSIS — Y92019 Unspecified place in single-family (private) house as the place of occurrence of the external cause: Secondary | ICD-10-CM | POA: Insufficient documentation

## 2020-05-09 DIAGNOSIS — I10 Essential (primary) hypertension: Secondary | ICD-10-CM | POA: Insufficient documentation

## 2020-05-09 LAB — BASIC METABOLIC PANEL WITH GFR
Anion gap: 6 (ref 5–15)
BUN: 24 mg/dL — ABNORMAL HIGH (ref 8–23)
CO2: 27 mmol/L (ref 22–32)
Calcium: 8.3 mg/dL — ABNORMAL LOW (ref 8.9–10.3)
Chloride: 108 mmol/L (ref 98–111)
Creatinine, Ser: 1.38 mg/dL — ABNORMAL HIGH (ref 0.61–1.24)
GFR calc Af Amer: 59 mL/min — ABNORMAL LOW
GFR calc non Af Amer: 51 mL/min — ABNORMAL LOW
Glucose, Bld: 109 mg/dL — ABNORMAL HIGH (ref 70–99)
Potassium: 3.9 mmol/L (ref 3.5–5.1)
Sodium: 141 mmol/L (ref 135–145)

## 2020-05-09 LAB — CBC WITH DIFFERENTIAL/PLATELET
Abs Immature Granulocytes: 0.03 K/uL (ref 0.00–0.07)
Basophils Absolute: 0.1 K/uL (ref 0.0–0.1)
Basophils Relative: 1 %
Eosinophils Absolute: 0.2 K/uL (ref 0.0–0.5)
Eosinophils Relative: 2 %
HCT: 37.4 % — ABNORMAL LOW (ref 39.0–52.0)
Hemoglobin: 12.3 g/dL — ABNORMAL LOW (ref 13.0–17.0)
Immature Granulocytes: 0 %
Lymphocytes Relative: 21 %
Lymphs Abs: 2 K/uL (ref 0.7–4.0)
MCH: 32.1 pg (ref 26.0–34.0)
MCHC: 32.9 g/dL (ref 30.0–36.0)
MCV: 97.7 fL (ref 80.0–100.0)
Monocytes Absolute: 0.7 K/uL (ref 0.1–1.0)
Monocytes Relative: 8 %
Neutro Abs: 6.6 K/uL (ref 1.7–7.7)
Neutrophils Relative %: 68 %
Platelets: 203 K/uL (ref 150–400)
RBC: 3.83 MIL/uL — ABNORMAL LOW (ref 4.22–5.81)
RDW: 14.2 % (ref 11.5–15.5)
WBC: 9.7 K/uL (ref 4.0–10.5)
nRBC: 0 % (ref 0.0–0.2)

## 2020-05-09 NOTE — ED Triage Notes (Signed)
Pt arrives with [facility] from home. Pt was transferring himself from wheelchair to bed and states his legs gave out. EMS called for lifting assistance but found pts BP to be 74/46. Pt give 1L NS by EMS and arrives with BP 137/61. Pt A&O and at baseline per family.

## 2020-05-16 NOTE — ED Provider Notes (Signed)
[facility] EMERGENCY DEPARTMENT Provider Note   CSN: [id number] Arrival date & time: 05/09/20  2034     History Chief Complaint  Patient presents with  . Hypotension    Lucas [name] is a 74 y.o. male.  HPI   73ym with hypotension. Lost balance from chair and fell. Couldn't get up so called ems. Denies injury from fall though. Noted to be hypotensive and brought to ED. No acute complaints.   Past Medical History:  Diagnosis Date  . Acute CVA (cerebrovascular accident) ([place]) 06/06/2015  . Acute ischemic stroke ([place]) 01/19/2015   Left midbrain/thalamus.  Marland Kitchen AKI (acute kidney injury) ([place]) 03/27/2020  . Alcohol use   . Anxiety   . Cataract   . Cerebral ventriculomegaly 01/20/2015  . COPD (chronic obstructive pulmonary disease) ([place])   . Depression   . Depression with anxiety   . Hyperlipidemia   . Hypertension   . Neuromuscular disorder ([place])   . Parkinson's disease ([place])   . Substance abuse ([place])   . Tobacco abuse     Patient Active Problem List   Diagnosis Date Noted  . Hypotension 03/27/2020  . Left leg pain 03/27/2020  . AKI (acute kidney injury) ([place]) 03/27/2020  . Cellulitis 03/27/2020  . Restless leg syndrome 03/27/2020  . Stroke ([place]) 01/25/2017  . Essential hypertension 06/06/2015  . Hyperlipidemia 06/06/2015  . COPD (chronic obstructive pulmonary disease) ([place]) 06/06/2015  . Multiple falls 06/05/2015  . Cerebral ventriculomegaly 01/20/2015  . Facial droop 01/19/2015  . Tobacco abuse 01/19/2015  . Obesity 01/19/2015  . Depression with anxiety 01/19/2015  . Hyperglycemia 01/19/2015  . Dysarthria 01/15/2015  . Parkinson's disease [facility])    Past Surgical History:  Procedure Laterality Date  . EYE SURGERY    . SHOULDER SURGERY       Family History  Problem Relation Age of Onset  . Cancer Father        lung  . Cancer Brother        lung  . Early death Brother        suicide  . Early death Brother        mva  . COPD Sister   . Diabetes Sister    . Heart disease Sister   . Early death Sister        fire    Social History   Tobacco Use  . Smoking status: Current Every Day Smoker    Packs/day: 1.00    Types: Cigarettes  . Smokeless tobacco: Never Used  Vaping Use  . Vaping Use: Never used  Substance Use Topics  . Alcohol use: No    Comment: former  . Drug use: No    Home Medications Prior to Admission medications   Medication Sig Start Date End Date Taking? Authorizing Provider  albuterol (PROVENTIL HFA;VENTOLIN HFA) 108 (90 Base) MCG/ACT inhaler Inhale 1 puff into the lungs every 4 (four) hours as needed for wheezing or shortness of breath.    [provider]  aspirin EC 81 MG EC tablet Take 1 tablet (81 mg total) by mouth daily. 01/16/15   Isaac Bliss, [name], MD  atorvastatin (LIPITOR) 10 MG tablet Take 10 mg by mouth every evening.  04/27/15   [provider]  carbidopa-levodopa (SINEMET IR) 25-100 MG tablet Take 1 tablet by mouth 3 (three) times daily. 11/15/19   [provider]  clonazePAM (KLONOPIN) 1 MG tablet Take 1 mg by mouth daily as needed. 12/11/19   [provider]  FAMOTIDINE PO Take 1 tablet by mouth daily.    [provider]  furosemide (LASIX) 20 MG tablet Take 20 mg by mouth daily. 12/14/19   [provider]  hydrochlorothiazide (HYDRODIURIL) 25 MG tablet Take 25 mg by mouth daily. 11/09/19   [provider]  Infant Care Products (DERMACLOUD) CREA Apply 1 application topically daily.  11/25/19   [provider]  ipratropium-albuterol (DUONEB) 0.5-2.5 (3) MG/3ML SOLN SMARTSIG:3 Milliliter(s) Via Nebulizer Every 6 Hours 12/14/19   [provider]  lisinopril (ZESTRIL) 20 MG tablet Take 20 mg by mouth daily. 11/15/19   [provider]  PARoxetine (PAXIL) 40 MG tablet Take 60 mg by mouth every morning.     [provider]  rOPINIRole (REQUIP) 3 MG tablet Take 3 mg by mouth in the morning, at noon, and at  bedtime.     [provider]    Allergies    Patient has no known allergies.  Review of Systems   Review of Systems All systems reviewed and negative, other than as noted in HPI.  Physical Exam Updated Vital Signs BP (!) 125/46   Pulse 60   Temp 98.6 F (37 C) (Oral)   Resp 20   Ht 5\' 8"  (1.727 m)   Wt 95.3 kg   SpO2 97%   BMI 31.95 kg/m   Physical Exam Vitals and nursing note reviewed.  Constitutional:      General: He is not in acute distress.    Appearance: He is well-developed.  HENT:     Head: Normocephalic and atraumatic.  Eyes:     General:        Right eye: No discharge.        Left eye: No discharge.     Conjunctiva/sclera: Conjunctivae normal.  Cardiovascular:     Rate and Rhythm: Normal rate and regular rhythm.     Heart sounds: Normal heart sounds. No murmur heard.  No friction rub. No gallop.   Pulmonary:     Effort: Pulmonary effort is normal. No respiratory distress.     Breath sounds: Normal breath sounds.  Abdominal:     General: There is no distension.     Palpations: Abdomen is soft.     Tenderness: There is no abdominal tenderness.  Musculoskeletal:        General: No tenderness.     Cervical back: Neck supple.  Skin:    General: Skin is warm and dry.  Neurological:     Mental Status: He is alert.  Psychiatric:        Behavior: Behavior normal.        Thought Content: Thought content normal.     ED Results / Procedures / Treatments   Labs (all labs ordered are listed, but only abnormal results are displayed) Labs Reviewed  CBC WITH DIFFERENTIAL/PLATELET - Abnormal; Notable for the following components:      Result Value   RBC 3.83 (*)    Hemoglobin 12.3 (*)    HCT 37.4 (*)    All other components within normal limits  BASIC METABOLIC PANEL - Abnormal; Notable for the following components:   Glucose, Bld 109 (*)    BUN 24 (*)    Creatinine, Ser 1.38 (*)    Calcium 8.3 (*)    GFR calc non Af Amer 51 (*)    GFR calc  Af Amer 59 (*)    All other components within normal limits    EKG None  Radiology No results found.  Procedures Procedures (including critical care time)  Medications Ordered in ED Medications - No data to display  ED Course  I have reviewed the triage vital signs and the nursing notes.  Pertinent labs & imaging results that were available during my care of the patient were reviewed by me and considered in my medical decision making (see chart for details).    MDM Rules/Calculators/A&P                          Final Clinical Impression(s) / ED Diagnoses Final diagnoses:  Fall, initial encounter  Hypotension, unspecified hypotension type    Rx / DC Orders ED Discharge Orders    None       Virgel Manifold, MD 05/16/20 [phone]

## 2021-03-04 ENCOUNTER — Emergency Department (HOSPITAL_COMMUNITY)
Admission: EM | Admit: 2021-03-04 | Discharge: 2021-03-05 | Disposition: A | Discharge status: Home / Self Care | Payer: Medicare Other | Attending: Emergency Medicine | Admitting: Emergency Medicine

## 2021-03-04 ENCOUNTER — Encounter (HOSPITAL_COMMUNITY): Payer: Self-pay | Admitting: Emergency Medicine

## 2021-03-04 ENCOUNTER — Other Ambulatory Visit: Payer: Self-pay

## 2021-03-04 DIAGNOSIS — G20 Parkinson's disease: Secondary | ICD-10-CM | POA: Insufficient documentation

## 2021-03-04 DIAGNOSIS — M7989 Other specified soft tissue disorders: Secondary | ICD-10-CM | POA: Diagnosis present

## 2021-03-04 DIAGNOSIS — Z79899 Other long term (current) drug therapy: Secondary | ICD-10-CM | POA: Insufficient documentation

## 2021-03-04 DIAGNOSIS — Z7951 Long term (current) use of inhaled steroids: Secondary | ICD-10-CM | POA: Insufficient documentation

## 2021-03-04 DIAGNOSIS — F17210 Nicotine dependence, cigarettes, uncomplicated: Secondary | ICD-10-CM | POA: Insufficient documentation

## 2021-03-04 DIAGNOSIS — R609 Edema, unspecified: Secondary | ICD-10-CM | POA: Insufficient documentation

## 2021-03-04 DIAGNOSIS — J449 Chronic obstructive pulmonary disease, unspecified: Secondary | ICD-10-CM | POA: Diagnosis not present

## 2021-03-04 DIAGNOSIS — Z7982 Long term (current) use of aspirin: Secondary | ICD-10-CM | POA: Insufficient documentation

## 2021-03-04 DIAGNOSIS — I10 Essential (primary) hypertension: Secondary | ICD-10-CM | POA: Diagnosis not present

## 2021-03-04 DIAGNOSIS — M79604 Pain in right leg: Secondary | ICD-10-CM

## 2021-03-04 LAB — CBC
HCT: 40.3 % (ref 39.0–52.0)
Hemoglobin: 13.5 g/dL (ref 13.0–17.0)
MCH: 32.5 pg (ref 26.0–34.0)
MCHC: 33.5 g/dL (ref 30.0–36.0)
MCV: 96.9 fL (ref 80.0–100.0)
Platelets: 208 10*3/uL (ref 150–400)
RBC: 4.16 MIL/uL — ABNORMAL LOW (ref 4.22–5.81)
RDW: 14.7 % (ref 11.5–15.5)
WBC: 7.9 10*3/uL (ref 4.0–10.5)
nRBC: 0 % (ref 0.0–0.2)

## 2021-03-04 LAB — COMPREHENSIVE METABOLIC PANEL
ALT: 15 U/L (ref 0–44)
AST: 16 U/L (ref 15–41)
Albumin: 3.8 g/dL (ref 3.5–5.0)
Alkaline Phosphatase: 70 U/L (ref 38–126)
Anion gap: 6 (ref 5–15)
BUN: 26 mg/dL — ABNORMAL HIGH (ref 8–23)
CO2: 25 mmol/L (ref 22–32)
Calcium: 8.7 mg/dL — ABNORMAL LOW (ref 8.9–10.3)
Chloride: 109 mmol/L (ref 98–111)
Creatinine, Ser: 1.35 mg/dL — ABNORMAL HIGH (ref 0.61–1.24)
GFR, Estimated: 55 mL/min — ABNORMAL LOW (ref >60)
Glucose, Bld: 127 mg/dL — ABNORMAL HIGH (ref 70–99)
Potassium: 3.9 mmol/L (ref 3.5–5.1)
Sodium: 140 mmol/L (ref 135–145)
Total Bilirubin: 0.9 mg/dL (ref 0.3–1.2)
Total Protein: 7.2 g/dL (ref 6.5–8.1)

## 2021-03-04 MED ORDER — HYDROCODONE-ACETAMINOPHEN 5-325 MG PO TABS
2.0000 | ORAL_TABLET | Freq: Once | ORAL | Status: AC
  Administered 2021-03-04: 2 via ORAL
  Filled 2021-03-04: qty 2

## 2021-03-04 MED ORDER — FUROSEMIDE 10 MG/ML IJ SOLN
40.0000 mg | INTRAMUSCULAR | Status: AC
  Administered 2021-03-04: 40 mg via INTRAVENOUS
  Filled 2021-03-04: qty 4

## 2021-03-04 MED ORDER — FUROSEMIDE 20 MG PO TABS: 40.0000 mg | ORAL_TABLET | Freq: Every day | ORAL | 0 refills | Status: DC

## 2021-03-04 NOTE — Discharge Instructions (Addendum)
I have prescribed an increased dose of your fluid pill to help with the swelling in your legs however I would strongly recommend that you start wearing compression stockings as well.  You will need to see your family doctor within 2 weeks for repeat evaluation.  There is no signs of infection in your legs, this should gradually get better as the swelling goes down however if your pain continues your family doctor will need to address this

## 2021-03-04 NOTE — ED Notes (Signed)
Pt reporting bilateral pain behind both knees traveling down to feet

## 2021-03-04 NOTE — ED Notes (Signed)
ED Provider at bedside. 

## 2021-03-04 NOTE — ED Triage Notes (Signed)
Pt brought in from home by [facility]. Pt c/o bilateral feet swelling for the past few years. States now he has pain with the swelling.

## 2021-03-04 NOTE — ED Provider Notes (Signed)
[facility] EMERGENCY DEPARTMENT Provider Note   CSN: [id number] Arrival date & time: 03/04/21  2119     History Chief Complaint  Patient presents with   Foot Swelling    Lucas [name] is a 75 y.o. male.  HPI  This patient is a 75 year old male, he has a known history of acute stroke in the past, this was a midbrain and thalamic stroke in 2016, he has had alcohol use, he has had COPD, hyperlipidemia, hypertension, Parkinson's disease substance abuse and tobacco abuse.  According to the medical record the patient is taking Lasix, 20 mg by mouth every day.  He is also on multiple other medications including Sinemet, atorvastatin, lisinopril, Paxil, Requip, hydrochlorothiazide, clonazepam and a baby aspirin.  He states that he has doctors both in [place] as well as at the [facility].  He is concerned because his legs have been swelling for several years, the swelling has been persistent, and seems to be gradually getting worse over time, he is now having pain in both legs that goes from his knees down to his feet.  He comes in by ambulance tonight because of increasing swelling in his feet as well as the pain.  He denies any changes in color of the skin, no redness, no warmth, no fever and he has not had any nausea vomiting or diarrhea.  Past Medical History:  Diagnosis Date   Acute CVA (cerebrovascular accident) ([place]) 06/06/2015   Acute ischemic stroke ([place]) 01/19/2015   Left midbrain/thalamus.   AKI (acute kidney injury) ([place]) 03/27/2020   Alcohol use    Anxiety    Cataract    Cerebral ventriculomegaly 01/20/2015   COPD (chronic obstructive pulmonary disease) ([facility])    Depression    Depression with anxiety    Hyperlipidemia    Hypertension    Neuromuscular disorder ([place])    Parkinson's disease ([place])    Substance abuse ([place])    Tobacco abuse     Patient Active Problem List   Diagnosis Date Noted   Hypotension 03/27/2020   Left leg pain 03/27/2020    AKI (acute kidney injury) ([place]) 03/27/2020   Cellulitis 03/27/2020   Restless leg syndrome 03/27/2020   Stroke ([place]) 01/25/2017   Essential hypertension 06/06/2015   Hyperlipidemia 06/06/2015   COPD (chronic obstructive pulmonary disease) ([place]) 06/06/2015   Multiple falls 06/05/2015   Cerebral ventriculomegaly 01/20/2015   Facial droop 01/19/2015   Tobacco abuse 01/19/2015   Obesity 01/19/2015   Depression with anxiety 01/19/2015   Hyperglycemia 01/19/2015   Dysarthria 01/15/2015   Parkinson's disease ([place])     Past Surgical History:  Procedure Laterality Date   APPENDECTOMY     EYE SURGERY     SHOULDER SURGERY         Family History  Problem Relation Age of Onset   Cancer Father        lung   Cancer Brother        lung   Early death Brother        suicide   Early death Brother        mva   COPD Sister    Diabetes Sister    Heart disease Sister    Early death Sister        fire    Social History   Tobacco Use   Smoking status: Every Day    Packs/day: 1.00    Pack years: 0.00    Types: Cigarettes   Smokeless  tobacco: Never  Vaping Use   Vaping Use: Never used  Substance Use Topics   Alcohol use: No    Comment: former   Drug use: No    Home Medications Prior to Admission medications   Medication Sig Start Date End Date Taking? Authorizing Provider  albuterol (PROVENTIL HFA;VENTOLIN HFA) 108 (90 Base) MCG/ACT inhaler Inhale 1 puff into the lungs every 4 (four) hours as needed for wheezing or shortness of breath.    [provider]  aspirin EC 81 MG EC tablet Take 1 tablet (81 mg total) by mouth daily. 01/16/15   Isaac Bliss, [name], MD  atorvastatin (LIPITOR) 10 MG tablet Take 10 mg by mouth every evening.  04/27/15   [provider]  carbidopa-levodopa (SINEMET IR) 25-100 MG tablet Take 1 tablet by mouth 3 (three) times daily. 11/15/19   [provider]  clonazePAM (KLONOPIN) 1 MG tablet Take 1 mg by mouth daily as  needed. 12/11/19   [provider]  FAMOTIDINE PO Take 1 tablet by mouth daily.    [provider]  furosemide (LASIX) 20 MG tablet Take 2 tablets (40 mg total) by mouth daily for 14 days. 03/04/21 03/18/21  Noemi Chapel, MD  hydrochlorothiazide (HYDRODIURIL) 25 MG tablet Take 25 mg by mouth daily. 11/09/19   [provider]  Infant Care Products (DERMACLOUD) CREA Apply 1 application topically daily.  11/25/19   [provider]  ipratropium-albuterol (DUONEB) 0.5-2.5 (3) MG/3ML SOLN SMARTSIG:3 Milliliter(s) Via Nebulizer Every 6 Hours 12/14/19   [provider]  lisinopril (ZESTRIL) 20 MG tablet Take 20 mg by mouth daily. 11/15/19   [provider]  PARoxetine (PAXIL) 40 MG tablet Take 60 mg by mouth every morning.     [provider]  rOPINIRole (REQUIP) 3 MG tablet Take 3 mg by mouth in the morning, at noon, and at bedtime.     [provider]    Allergies    Patient has no known allergies.  Review of Systems   Review of Systems  All other systems reviewed and are negative.  Physical Exam Updated Vital Signs BP 106/65   Pulse (!) 58   Temp 97.8 F (36.6 C) (Oral)   Resp 16   Ht 1.727 m (5\' 8" )   Wt 90.7 kg   SpO2 97%   BMI 30.41 kg/m   Physical Exam Vitals and nursing note reviewed.  Constitutional:      General: He is not in acute distress.    Appearance: He is well-developed.  HENT:     Head: Normocephalic and atraumatic.     Mouth/Throat:     Pharynx: No oropharyngeal exudate.  Eyes:     General: No scleral icterus.       Right eye: No discharge.        Left eye: No discharge.     Conjunctiva/sclera: Conjunctivae normal.     Pupils: Pupils are equal, round, and reactive to light.  Neck:     Thyroid: No thyromegaly.     Vascular: No JVD.  Cardiovascular:     Rate and Rhythm: Normal rate and regular rhythm.     Heart sounds: Normal heart sounds. No murmur heard.   No friction rub. No gallop.   Pulmonary:     Effort: Pulmonary effort is normal. No respiratory distress.     Breath sounds: Normal breath sounds. No wheezing or rales.  Abdominal:     General: Bowel sounds are normal. There is no  distension.     Palpations: Abdomen is soft. There is no mass.     Tenderness: There is no abdominal tenderness.  Musculoskeletal:        General: No tenderness. Normal range of motion.     Cervical back: Normal range of motion and neck supple.     Right lower leg: Edema present.     Left lower leg: Edema present.     Comments: Bilateral symmetrical pitting edema from just below the knees through the feet  Lymphadenopathy:     Cervical: No cervical adenopathy.  Skin:    General: Skin is warm and dry.     Findings: No erythema or rash.     Comments: There is no hyperpigmentation of the feet or the legs, there is no open wounds to the skin, there is no signs of cellulitis it is not warm or indurated and no peau d'orange  Neurological:     General: No focal deficit present.     Mental Status: He is alert.     Coordination: Coordination normal.     Comments: The patient is able to lift both legs, lift both arms, normal grips, his speech is slow and he has stone facies  Psychiatric:        Behavior: Behavior normal.    ED Results / Procedures / Treatments   Labs (all labs ordered are listed, but only abnormal results are displayed) Labs Reviewed  COMPREHENSIVE METABOLIC PANEL - Abnormal; Notable for the following components:      Result Value   Glucose, Bld 127 (*)    BUN 26 (*)    Creatinine, Ser 1.35 (*)    Calcium 8.7 (*)    GFR, Estimated 55 (*)    All other components within normal limits  CBC - Abnormal; Notable for the following components:   RBC 4.16 (*)    All other components within normal limits    EKG EKG Interpretation  Date/Time:  Saturday March 04 2021 21:34:30 EDT Ventricular Rate:  66 PR Interval:  200 QRS Duration: 90 QT Interval:  382 QTC  Calculation: 401 R Axis:   55 Text Interpretation: Sinus rhythm Nonspecific T abnrm, anterolateral leads Baseline wander in lead(s) V3 V4 V6 Confirmed by Noemi Chapel [phone]) on 03/04/2021 9:57:21 PM  Radiology No results found.  Procedures Procedures   Medications Ordered in ED Medications  HYDROcodone-acetaminophen (NORCO/VICODIN) 5-325 MG per tablet 2 tablet (has no administration in time range)  furosemide (LASIX) injection 40 mg (40 mg Intravenous Given 03/04/21 2226)    ED Course  I have reviewed the triage vital signs and the nursing notes.  Pertinent labs & imaging results that were available during my care of the patient were reviewed by me and considered in my medical decision making (see chart for details).    MDM Rules/Calculators/A&P                          The patient symptoms seem to be somewhat chronic, it is unclear why he is having pain in both legs tonight but it does not appear to be related to any signs of infection which would also be abnormal if it was symmetrical.  He is not febrile hypotensive tachycardic nor is he hypoxic.  He will get some IV Lasix we will check a few labs to make sure his renal function is okay given that he is on 2 different diuretic type medications.  The patient was  given Lasix, he was given hydrocodone for pain, he has no acute findings on his laboratory work-up to raise any concern.  He will need to follow-up in the outpatient setting for ongoing chronic pain control but at this time I have increased his Lasix dose.  The patient is agreeable to the plan  Final Clinical Impression(s) / ED Diagnoses Final diagnoses:  Leg pain, bilateral  Peripheral edema    Rx / DC Orders ED Discharge Orders          Ordered    furosemide (LASIX) 20 MG tablet  Daily        03/04/21 2325             Noemi Chapel, MD 03/04/21 2327

## 2021-06-08 ENCOUNTER — Encounter (HOSPITAL_COMMUNITY): Payer: Self-pay | Admitting: Emergency Medicine

## 2021-06-08 ENCOUNTER — Emergency Department (HOSPITAL_COMMUNITY)
Admission: EM | Admit: 2021-06-08 | Discharge: 2021-06-09 | Disposition: A | Discharge status: Home / Self Care | Payer: Medicare Other | Attending: Emergency Medicine | Admitting: Emergency Medicine

## 2021-06-08 DIAGNOSIS — Z20822 Contact with and (suspected) exposure to covid-19: Secondary | ICD-10-CM | POA: Insufficient documentation

## 2021-06-08 DIAGNOSIS — M79662 Pain in left lower leg: Secondary | ICD-10-CM | POA: Diagnosis not present

## 2021-06-08 DIAGNOSIS — Z79899 Other long term (current) drug therapy: Secondary | ICD-10-CM | POA: Insufficient documentation

## 2021-06-08 DIAGNOSIS — R609 Edema, unspecified: Secondary | ICD-10-CM

## 2021-06-08 DIAGNOSIS — F17210 Nicotine dependence, cigarettes, uncomplicated: Secondary | ICD-10-CM | POA: Insufficient documentation

## 2021-06-08 DIAGNOSIS — G20 Parkinson's disease: Secondary | ICD-10-CM | POA: Insufficient documentation

## 2021-06-08 DIAGNOSIS — I10 Essential (primary) hypertension: Secondary | ICD-10-CM | POA: Insufficient documentation

## 2021-06-08 DIAGNOSIS — Z7982 Long term (current) use of aspirin: Secondary | ICD-10-CM | POA: Diagnosis not present

## 2021-06-08 DIAGNOSIS — J441 Chronic obstructive pulmonary disease with (acute) exacerbation: Secondary | ICD-10-CM | POA: Insufficient documentation

## 2021-06-08 DIAGNOSIS — Z7951 Long term (current) use of inhaled steroids: Secondary | ICD-10-CM | POA: Insufficient documentation

## 2021-06-08 DIAGNOSIS — M79661 Pain in right lower leg: Secondary | ICD-10-CM | POA: Diagnosis not present

## 2021-06-08 DIAGNOSIS — R600 Localized edema: Secondary | ICD-10-CM | POA: Diagnosis not present

## 2021-06-08 NOTE — ED Triage Notes (Signed)
Pt brought in by [facility] c/o chronic leg pain and lower leg swelling. Pt called EMS earlier tonight for same and pts son gave him a pain pill at that time.

## 2021-06-09 ENCOUNTER — Emergency Department (HOSPITAL_COMMUNITY): Payer: Medicare Other

## 2021-06-09 LAB — BASIC METABOLIC PANEL
Anion gap: 6 (ref 5–15)
BUN: 20 mg/dL (ref 8–23)
CO2: 25 mmol/L (ref 22–32)
Calcium: 8.3 mg/dL — ABNORMAL LOW (ref 8.9–10.3)
Chloride: 110 mmol/L (ref 98–111)
Creatinine, Ser: 1.24 mg/dL (ref 0.61–1.24)
GFR, Estimated: >60 mL/min (ref >60)
Glucose, Bld: 141 mg/dL — ABNORMAL HIGH (ref 70–99)
Potassium: 3.5 mmol/L (ref 3.5–5.1)
Sodium: 141 mmol/L (ref 135–145)

## 2021-06-09 LAB — CBC WITH DIFFERENTIAL/PLATELET
Abs Immature Granulocytes: 0.01 10*3/uL (ref 0.00–0.07)
Basophils Absolute: 0.1 10*3/uL (ref 0.0–0.1)
Basophils Relative: 1 %
Eosinophils Absolute: 0.2 10*3/uL (ref 0.0–0.5)
Eosinophils Relative: 2 %
HCT: 40.1 % (ref 39.0–52.0)
Hemoglobin: 13.6 g/dL (ref 13.0–17.0)
Immature Granulocytes: 0 %
Lymphocytes Relative: 28 %
Lymphs Abs: 2.2 10*3/uL (ref 0.7–4.0)
MCH: 33.7 pg (ref 26.0–34.0)
MCHC: 33.9 g/dL (ref 30.0–36.0)
MCV: 99.5 fL (ref 80.0–100.0)
Monocytes Absolute: 0.6 10*3/uL (ref 0.1–1.0)
Monocytes Relative: 8 %
Neutro Abs: 4.9 10*3/uL (ref 1.7–7.7)
Neutrophils Relative %: 61 %
Platelets: 182 10*3/uL (ref 150–400)
RBC: 4.03 MIL/uL — ABNORMAL LOW (ref 4.22–5.81)
RDW: 13.5 % (ref 11.5–15.5)
WBC: 7.9 10*3/uL (ref 4.0–10.5)
nRBC: 0 % (ref 0.0–0.2)

## 2021-06-09 LAB — RESP PANEL BY RT-PCR (FLU A&B, COVID) ARPGX2
Influenza A by PCR: NEGATIVE
Influenza B by PCR: NEGATIVE
SARS Coronavirus 2 by RT PCR: NEGATIVE

## 2021-06-09 LAB — BRAIN NATRIURETIC PEPTIDE: B Natriuretic Peptide: 113 pg/mL — ABNORMAL HIGH (ref 0.0–100.0)

## 2021-06-09 MED ORDER — METHYLPREDNISOLONE SODIUM SUCC 125 MG IJ SOLR
125.0000 mg | Freq: Once | INTRAMUSCULAR | Status: AC
  Administered 2021-06-09: 125 mg via INTRAVENOUS
  Filled 2021-06-09: qty 2

## 2021-06-09 MED ORDER — ALBUTEROL (5 MG/ML) CONTINUOUS INHALATION SOLN
10.0000 mg/h | INHALATION_SOLUTION | Freq: Once | RESPIRATORY_TRACT | Status: AC
  Administered 2021-06-09: 10 mg/h via RESPIRATORY_TRACT
  Filled 2021-06-09: qty 20

## 2021-06-09 MED ORDER — HYDROCODONE-ACETAMINOPHEN 5-325 MG PO TABS
1.0000 | ORAL_TABLET | Freq: Once | ORAL | Status: AC
  Administered 2021-06-09: 1 via ORAL
  Filled 2021-06-09: qty 1

## 2021-06-09 MED ORDER — PREDNISONE 50 MG PO TABS: ORAL_TABLET | ORAL | 0 refills | Status: DC

## 2021-06-09 MED ORDER — ALBUTEROL SULFATE (2.5 MG/3ML) 0.083% IN NEBU
5.0000 mg | INHALATION_SOLUTION | Freq: Once | RESPIRATORY_TRACT | Status: AC
  Administered 2021-06-09: 5 mg via RESPIRATORY_TRACT
  Filled 2021-06-09: qty 6

## 2021-06-09 MED ORDER — IPRATROPIUM BROMIDE 0.02 % IN SOLN
0.5000 mg | Freq: Once | RESPIRATORY_TRACT | Status: AC
  Administered 2021-06-09: 0.5 mg via RESPIRATORY_TRACT
  Filled 2021-06-09: qty 2.5

## 2021-06-09 NOTE — ED Provider Notes (Signed)
[facility] EMERGENCY DEPARTMENT Provider Note   CSN: [id number] Arrival date & time: 06/08/21  2348     History Chief Complaint  Patient presents with   Leg Pain    Lucas [name] is a 75 y.o. male.  The history is provided by the patient.  Leg Pain Location:  Leg Leg location:  L lower leg and R lower leg Pain details:    Quality:  Aching   Onset quality:  Gradual   Timing:  Constant Chronicity:  Chronic Relieved by:  Nothing Exacerbated by: Movement. Associated symptoms: no fever   Patient with history of stroke, Parkinson's presents with chronic leg pain.  Patient reports he has had leg pain and swelling for quite some time.  No recent trauma.     Past Medical History:  Diagnosis Date   Acute CVA (cerebrovascular accident) ([place]) 06/06/2015   Acute ischemic stroke ([place]) 01/19/2015   Left midbrain/thalamus.   AKI (acute kidney injury) ([place]) 03/27/2020   Alcohol use    Anxiety    Cataract    Cerebral ventriculomegaly 01/20/2015   COPD (chronic obstructive pulmonary disease) (HCC)    Depression    Depression with anxiety    Hyperlipidemia    Hypertension    Neuromuscular disorder (HCC)    Parkinson's disease ([place])    Substance abuse ([place])    Tobacco abuse     Patient Active Problem List   Diagnosis Date Noted   Hypotension 03/27/2020   Left leg pain 03/27/2020   AKI (acute kidney injury) ([place]) 03/27/2020   Cellulitis 03/27/2020   Restless leg syndrome 03/27/2020   Stroke ([place]) 01/25/2017   Essential hypertension 06/06/2015   Hyperlipidemia 06/06/2015   COPD (chronic obstructive pulmonary disease) ([place]) 06/06/2015   Multiple falls 06/05/2015   Cerebral ventriculomegaly 01/20/2015   Facial droop 01/19/2015   Tobacco abuse 01/19/2015   Obesity 01/19/2015   Depression with anxiety 01/19/2015   Hyperglycemia 01/19/2015   Dysarthria 01/15/2015   Parkinson's disease ([place])     Past Surgical History:  Procedure Laterality Date   APPENDECTOMY     EYE  SURGERY     SHOULDER SURGERY         Family History  Problem Relation Age of Onset   Cancer Father        lung   Cancer Brother        lung   Early death Brother        suicide   Early death Brother        mva   COPD Sister    Diabetes Sister    Heart disease Sister    Early death Sister        fire    Social History   Tobacco Use   Smoking status: Every Day    Packs/day: 1.00    Types: Cigarettes   Smokeless tobacco: Never  Vaping Use   Vaping Use: Never used  Substance Use Topics   Alcohol use: No    Comment: former   Drug use: No    Home Medications Prior to Admission medications   Medication Sig Start Date End Date Taking? Authorizing Provider  predniSONE (DELTASONE) 50 MG tablet 1 tablet PO QD X4 days 06/09/21  Yes Ripley Fraise, MD  albuterol (PROVENTIL HFA;VENTOLIN HFA) 108 (90 Base) MCG/ACT inhaler Inhale 1 puff into the lungs every 4 (four) hours as needed for wheezing or shortness of breath.    [provider]  aspirin EC 81 MG EC  tablet Take 1 tablet (81 mg total) by mouth daily. 01/16/15   Isaac Bliss, [name], MD  atorvastatin (LIPITOR) 10 MG tablet Take 10 mg by mouth every evening.  04/27/15   [provider]  carbidopa-levodopa (SINEMET IR) 25-100 MG tablet Take 1 tablet by mouth 3 (three) times daily. 11/15/19   [provider]  clonazePAM (KLONOPIN) 1 MG tablet Take 1 mg by mouth daily as needed. 12/11/19   [provider]  FAMOTIDINE PO Take 1 tablet by mouth daily.    [provider]  furosemide (LASIX) 20 MG tablet Take 2 tablets (40 mg total) by mouth daily for 14 days. 03/04/21 03/18/21  Noemi Chapel, MD  hydrochlorothiazide (HYDRODIURIL) 25 MG tablet Take 25 mg by mouth daily. 11/09/19   [provider]  Infant Care Products (DERMACLOUD) CREA Apply 1 application topically daily.  11/25/19   [provider]  ipratropium-albuterol (DUONEB) 0.5-2.5 (3) MG/3ML SOLN SMARTSIG:3  Milliliter(s) Via Nebulizer Every 6 Hours 12/14/19   [provider]  lisinopril (ZESTRIL) 20 MG tablet Take 20 mg by mouth daily. 11/15/19   [provider]  PARoxetine (PAXIL) 40 MG tablet Take 60 mg by mouth every morning.     [provider]  rOPINIRole (REQUIP) 3 MG tablet Take 3 mg by mouth in the morning, at noon, and at bedtime.     [provider]    Allergies    Patient has no known allergies.  Review of Systems   Review of Systems  Constitutional:  Negative for fever.  Respiratory:  Positive for shortness of breath.   Cardiovascular:  Positive for leg swelling. Negative for chest pain.  Gastrointestinal:  Negative for vomiting.  All other systems reviewed and are negative.  Physical Exam Updated Vital Signs BP (!) 139/109   Pulse 90   Temp 99.2 F (37.3 C) (Oral)   Resp 15   Ht 1.727 m (5\' 8" )   Wt 90.7 kg   SpO2 94%   BMI 30.40 kg/m   Physical Exam CONSTITUTIONAL: Chronically ill-appearing, disheveled HEAD: Normocephalic/atraumatic EYES: EOMI/PERRL ENMT: Mucous membranes moist NECK: supple no meningeal signs SPINE/BACK:entire spine nontender CV: S1/S2 noted, no murmurs/rubs/gallops noted LUNGS: Wheezing bilaterally ABDOMEN: soft, nontender, no rebound or guarding, bowel sounds noted throughout abdomen GU:no cva tenderness NEURO: Pt is awake/alert/appropriate, moves all extremitiesx4.  No facial droop.   EXTREMITIES: pulses normal/equal, full ROM Distal pulses equal and intact.  Pitting edema noted bilateral lower extremities.  See photo below SKIN: warm, see photo below PSYCH: Flat affect     ED Results / Procedures / Treatments   Labs (all labs ordered are listed, but only abnormal results are displayed) Labs Reviewed  BASIC METABOLIC PANEL - Abnormal; Notable for the following components:      Result Value   Glucose, Bld 141 (*)    Calcium 8.3 (*)    All other components within normal limits  CBC WITH  DIFFERENTIAL/PLATELET - Abnormal; Notable for the following components:   RBC 4.03 (*)    All other components within normal limits  BRAIN NATRIURETIC PEPTIDE - Abnormal; Notable for the following components:   B Natriuretic Peptide 113.0 (*)    All other components within normal limits  RESP PANEL BY RT-PCR (FLU A&B, COVID) ARPGX2    EKG EKG Interpretation  Date/Time:  Friday June 09 2021 02:09:57 EDT Ventricular Rate:  58 PR Interval:  177 QRS Duration: 91 QT Interval:  420 QTC Calculation: 413 R Axis:  71 Text Interpretation: Sinus rhythm Probable left atrial enlargement Interpretation limited secondary to artifact Confirmed by Ripley Fraise ([id number]) on 06/09/2021 2:23:28 AM  Radiology DG Chest Port 1 View  Result Date: 06/09/2021 CLINICAL DATA:  Dyspnea EXAM: PORTABLE CHEST 1 VIEW COMPARISON:  03/27/2020 FINDINGS: The heart size and mediastinal contours are within normal limits. Both lungs are clear. The visualized skeletal structures are unremarkable. IMPRESSION: No active disease. Electronically Signed   By: Fidela Salisbury M.D.   On: 06/09/2021 02:34    Procedures Procedures   Medications Ordered in ED Medications  albuterol (PROVENTIL) (2.5 MG/3ML) 0.083% nebulizer solution 5 mg (5 mg Nebulization Given 06/09/21 0229)  ipratropium (ATROVENT) nebulizer solution 0.5 mg (0.5 mg Nebulization Given 06/09/21 0229)  albuterol (PROVENTIL,VENTOLIN) solution continuous neb (10 mg/hr Nebulization Given 06/09/21 0358)  methylPREDNISolone sodium succinate (SOLU-MEDROL) 125 mg/2 mL injection 125 mg (125 mg Intravenous Given 06/09/21 0436)  HYDROcodone-acetaminophen (NORCO/VICODIN) 5-325 MG per tablet 1 tablet (1 tablet Oral Given 06/09/21 6384)    ED Course  I have reviewed the triage vital signs and the nursing notes.  Pertinent labs & imaging results that were available during my care of the patient were reviewed by me and considered in my medical decision making (see chart  for details).    MDM Rules/Calculators/A&P                           Patient very poor historian.  Reports chronic leg pain and swelling but decided to call EMS tonight.  He denies use of chronic oxygen but has been mildly hypoxic here with wheezing.  Neb therapy has been given.  Labs are pending at this time  I personally reviewed the chest x-ray and is negative 5:42 AM Patient monitored for several hours.  I suspect his peripheral edema is a chronic issue.  He reports compliance with Lasix.  I do not feel that this represents DVT or acute cellulitis.  He denies any recent trauma, there are no deformities to lower extremities Patient did have wheezing and a new oxygen requirement on arrival He was given multiple nebulized therapies and steroids and patient is now improved.  On room air his pulse ox is above 95%.  He still has wheezing but overall it is improving. I offered admission to the patient but he declines.  Patient arrived very disheveled and it was clear he is not receiving adequate home care.  He reports he plans to have someone at his home today to help out, and he still insists on being discharged.  At this point, we will respect his wishes and discharge home.  I will place a home health order to see if that helps with nursing, respiratory care and physical therapy.  Final Clinical Impression(s) / ED Diagnoses Final diagnoses:  Peripheral edema  COPD exacerbation ([place])    Rx / DC Orders ED Discharge Orders          Ordered    predniSONE (DELTASONE) 50 MG tablet        06/09/21 0542             Ripley Fraise, MD 06/09/21 [phone]

## 2021-06-27 ENCOUNTER — Emergency Department (HOSPITAL_COMMUNITY): Payer: Medicare Other

## 2021-06-27 ENCOUNTER — Other Ambulatory Visit: Payer: Self-pay

## 2021-06-27 ENCOUNTER — Encounter (HOSPITAL_COMMUNITY): Payer: Self-pay | Admitting: *Deleted

## 2021-06-27 ENCOUNTER — Inpatient Hospital Stay (HOSPITAL_COMMUNITY)
Admission: EM | Admit: 2021-06-27 | Discharge: 2021-07-21 | DRG: 682 | Disposition: A | Discharge status: Skilled Nursing Facility | Payer: Medicare Other | Attending: Internal Medicine | Admitting: Internal Medicine

## 2021-06-27 DIAGNOSIS — N320 Bladder-neck obstruction: Secondary | ICD-10-CM | POA: Diagnosis present

## 2021-06-27 DIAGNOSIS — D649 Anemia, unspecified: Secondary | ICD-10-CM | POA: Diagnosis present

## 2021-06-27 DIAGNOSIS — E876 Hypokalemia: Secondary | ICD-10-CM | POA: Diagnosis not present

## 2021-06-27 DIAGNOSIS — J449 Chronic obstructive pulmonary disease, unspecified: Secondary | ICD-10-CM

## 2021-06-27 DIAGNOSIS — Z79899 Other long term (current) drug therapy: Secondary | ICD-10-CM

## 2021-06-27 DIAGNOSIS — E875 Hyperkalemia: Secondary | ICD-10-CM | POA: Diagnosis not present

## 2021-06-27 DIAGNOSIS — R627 Adult failure to thrive: Secondary | ICD-10-CM | POA: Diagnosis present

## 2021-06-27 DIAGNOSIS — R0600 Dyspnea, unspecified: Secondary | ICD-10-CM

## 2021-06-27 DIAGNOSIS — R4182 Altered mental status, unspecified: Secondary | ICD-10-CM

## 2021-06-27 DIAGNOSIS — R609 Edema, unspecified: Secondary | ICD-10-CM

## 2021-06-27 DIAGNOSIS — L89892 Pressure ulcer of other site, stage 2: Secondary | ICD-10-CM | POA: Diagnosis present

## 2021-06-27 DIAGNOSIS — R531 Weakness: Secondary | ICD-10-CM

## 2021-06-27 DIAGNOSIS — I129 Hypertensive chronic kidney disease with stage 1 through stage 4 chronic kidney disease, or unspecified chronic kidney disease: Secondary | ICD-10-CM | POA: Diagnosis present

## 2021-06-27 DIAGNOSIS — I10 Essential (primary) hypertension: Secondary | ICD-10-CM | POA: Diagnosis present

## 2021-06-27 DIAGNOSIS — E872 Acidosis, unspecified: Secondary | ICD-10-CM | POA: Diagnosis present

## 2021-06-27 DIAGNOSIS — Z801 Family history of malignant neoplasm of trachea, bronchus and lung: Secondary | ICD-10-CM

## 2021-06-27 DIAGNOSIS — R34 Anuria and oliguria: Secondary | ICD-10-CM | POA: Diagnosis not present

## 2021-06-27 DIAGNOSIS — R0602 Shortness of breath: Secondary | ICD-10-CM

## 2021-06-27 DIAGNOSIS — Z833 Family history of diabetes mellitus: Secondary | ICD-10-CM

## 2021-06-27 DIAGNOSIS — F32A Depression, unspecified: Secondary | ICD-10-CM | POA: Diagnosis present

## 2021-06-27 DIAGNOSIS — M6282 Rhabdomyolysis: Secondary | ICD-10-CM | POA: Diagnosis present

## 2021-06-27 DIAGNOSIS — E8770 Fluid overload, unspecified: Secondary | ICD-10-CM

## 2021-06-27 DIAGNOSIS — N179 Acute kidney failure, unspecified: Principal | ICD-10-CM | POA: Diagnosis present

## 2021-06-27 DIAGNOSIS — G9341 Metabolic encephalopathy: Secondary | ICD-10-CM | POA: Diagnosis not present

## 2021-06-27 DIAGNOSIS — L8990 Pressure ulcer of unspecified site, unspecified stage: Secondary | ICD-10-CM | POA: Insufficient documentation

## 2021-06-27 DIAGNOSIS — Z20822 Contact with and (suspected) exposure to covid-19: Secondary | ICD-10-CM | POA: Diagnosis present

## 2021-06-27 DIAGNOSIS — G20 Parkinson's disease: Secondary | ICD-10-CM | POA: Diagnosis present

## 2021-06-27 DIAGNOSIS — K5792 Diverticulitis of intestine, part unspecified, without perforation or abscess without bleeding: Secondary | ICD-10-CM

## 2021-06-27 DIAGNOSIS — L89626 Pressure-induced deep tissue damage of left heel: Secondary | ICD-10-CM | POA: Diagnosis present

## 2021-06-27 DIAGNOSIS — R7401 Elevation of levels of liver transaminase levels: Secondary | ICD-10-CM | POA: Diagnosis present

## 2021-06-27 DIAGNOSIS — J9602 Acute respiratory failure with hypercapnia: Secondary | ICD-10-CM | POA: Diagnosis not present

## 2021-06-27 DIAGNOSIS — J441 Chronic obstructive pulmonary disease with (acute) exacerbation: Secondary | ICD-10-CM | POA: Diagnosis not present

## 2021-06-27 DIAGNOSIS — J9601 Acute respiratory failure with hypoxia: Secondary | ICD-10-CM | POA: Diagnosis not present

## 2021-06-27 DIAGNOSIS — F17210 Nicotine dependence, cigarettes, uncomplicated: Secondary | ICD-10-CM | POA: Diagnosis present

## 2021-06-27 DIAGNOSIS — L89326 Pressure-induced deep tissue damage of left buttock: Secondary | ICD-10-CM | POA: Diagnosis present

## 2021-06-27 DIAGNOSIS — Z7982 Long term (current) use of aspirin: Secondary | ICD-10-CM

## 2021-06-27 DIAGNOSIS — N329 Bladder disorder, unspecified: Secondary | ICD-10-CM | POA: Diagnosis present

## 2021-06-27 DIAGNOSIS — R0689 Other abnormalities of breathing: Secondary | ICD-10-CM

## 2021-06-27 DIAGNOSIS — F05 Delirium due to known physiological condition: Secondary | ICD-10-CM | POA: Diagnosis not present

## 2021-06-27 DIAGNOSIS — D72829 Elevated white blood cell count, unspecified: Secondary | ICD-10-CM | POA: Diagnosis present

## 2021-06-27 DIAGNOSIS — Z66 Do not resuscitate: Secondary | ICD-10-CM | POA: Diagnosis present

## 2021-06-27 DIAGNOSIS — N3289 Other specified disorders of bladder: Secondary | ICD-10-CM | POA: Diagnosis present

## 2021-06-27 DIAGNOSIS — F0282 Dementia in other diseases classified elsewhere, unspecified severity, with psychotic disturbance: Secondary | ICD-10-CM | POA: Diagnosis present

## 2021-06-27 DIAGNOSIS — Z8249 Family history of ischemic heart disease and other diseases of the circulatory system: Secondary | ICD-10-CM

## 2021-06-27 DIAGNOSIS — F419 Anxiety disorder, unspecified: Secondary | ICD-10-CM | POA: Diagnosis present

## 2021-06-27 DIAGNOSIS — R339 Retention of urine, unspecified: Secondary | ICD-10-CM | POA: Diagnosis present

## 2021-06-27 DIAGNOSIS — I959 Hypotension, unspecified: Secondary | ICD-10-CM | POA: Diagnosis present

## 2021-06-27 DIAGNOSIS — N1831 Chronic kidney disease, stage 3a: Secondary | ICD-10-CM | POA: Diagnosis present

## 2021-06-27 DIAGNOSIS — Z7401 Bed confinement status: Secondary | ICD-10-CM

## 2021-06-27 DIAGNOSIS — Z825 Family history of asthma and other chronic lower respiratory diseases: Secondary | ICD-10-CM

## 2021-06-27 DIAGNOSIS — E785 Hyperlipidemia, unspecified: Secondary | ICD-10-CM | POA: Diagnosis present

## 2021-06-27 LAB — CBC
HCT: 46.6 % (ref 39.0–52.0)
Hemoglobin: 15.6 g/dL (ref 13.0–17.0)
MCH: 32 pg (ref 26.0–34.0)
MCHC: 33.5 g/dL (ref 30.0–36.0)
MCV: 95.7 fL (ref 80.0–100.0)
Platelets: 248 10*3/uL (ref 150–400)
RBC: 4.87 MIL/uL (ref 4.22–5.81)
RDW: 13.2 % (ref 11.5–15.5)
WBC: 13.4 10*3/uL — ABNORMAL HIGH (ref 4.0–10.5)
nRBC: 0 % (ref 0.0–0.2)

## 2021-06-27 LAB — AMMONIA: Ammonia: 26 umol/L (ref 9–35)

## 2021-06-27 LAB — RESP PANEL BY RT-PCR (FLU A&B, COVID) ARPGX2
Influenza A by PCR: NEGATIVE
Influenza B by PCR: NEGATIVE
SARS Coronavirus 2 by RT PCR: NEGATIVE

## 2021-06-27 LAB — COMPREHENSIVE METABOLIC PANEL
ALT: 31 U/L (ref 0–44)
AST: 207 U/L — ABNORMAL HIGH (ref 15–41)
Albumin: 4.4 g/dL (ref 3.5–5.0)
Alkaline Phosphatase: 82 U/L (ref 38–126)
Anion gap: 18 — ABNORMAL HIGH (ref 5–15)
BUN: 67 mg/dL — ABNORMAL HIGH (ref 8–23)
CO2: 25 mmol/L (ref 22–32)
Calcium: 8.7 mg/dL — ABNORMAL LOW (ref 8.9–10.3)
Chloride: 96 mmol/L — ABNORMAL LOW (ref 98–111)
Creatinine, Ser: 4.61 mg/dL — ABNORMAL HIGH (ref 0.61–1.24)
GFR, Estimated: 13 mL/min — ABNORMAL LOW (ref >60)
Glucose, Bld: 87 mg/dL (ref 70–99)
Potassium: 4.5 mmol/L (ref 3.5–5.1)
Sodium: 139 mmol/L (ref 135–145)
Total Bilirubin: 2.4 mg/dL — ABNORMAL HIGH (ref 0.3–1.2)
Total Protein: 8.1 g/dL (ref 6.5–8.1)

## 2021-06-27 LAB — URINALYSIS, ROUTINE W REFLEX MICROSCOPIC
Bilirubin Urine: NEGATIVE
Glucose, UA: NEGATIVE mg/dL
Ketones, ur: 5 mg/dL — AB
Leukocytes,Ua: NEGATIVE
Nitrite: NEGATIVE
Protein, ur: NEGATIVE mg/dL
Specific Gravity, Urine: 1.012 (ref 1.005–1.030)
pH: 5 (ref 5.0–8.0)

## 2021-06-27 LAB — ETHANOL: Alcohol, Ethyl (B): <10 mg/dL (ref <10)

## 2021-06-27 LAB — BLOOD GAS, ARTERIAL
Acid-base deficit: 0.9 mmol/L (ref 0.0–2.0)
Bicarbonate: 23.8 mmol/L (ref 20.0–28.0)
Drawn by: 27733
FIO2: 21
O2 Saturation: 92.5 %
Patient temperature: 36.8
pCO2 arterial: 37.1 mmHg (ref 32.0–48.0)
pH, Arterial: 7.412 (ref 7.350–7.450)
pO2, Arterial: 69.2 mmHg — ABNORMAL LOW (ref 83.0–108.0)

## 2021-06-27 LAB — TSH: TSH: 3.349 u[IU]/mL (ref 0.350–4.500)

## 2021-06-27 LAB — LIPASE, BLOOD: Lipase: 22 U/L (ref 11–51)

## 2021-06-27 MED ORDER — ONDANSETRON HCL 4 MG/2ML IJ SOLN: 4.0000 mg | Freq: Four times a day (QID) | INTRAMUSCULAR | Status: DC | PRN

## 2021-06-27 MED ORDER — ACETAMINOPHEN 325 MG PO TABS
650.0000 mg | ORAL_TABLET | Freq: Four times a day (QID) | ORAL | Status: DC | PRN
  Administered 2021-06-27 – 2021-07-16 (×14): 650 mg via ORAL
  Filled 2021-06-27 (×14): qty 2

## 2021-06-27 MED ORDER — CARBIDOPA-LEVODOPA 25-100 MG PO TABS
1.0000 | ORAL_TABLET | Freq: Three times a day (TID) | ORAL | Status: DC
Start: 2021-06-27 — End: 2021-07-21
  Administered 2021-06-27 – 2021-07-21 (×71): 1 via ORAL
  Filled 2021-06-27 (×71): qty 1

## 2021-06-27 MED ORDER — MORPHINE SULFATE (PF) 4 MG/ML IV SOLN
4.0000 mg | Freq: Once | INTRAVENOUS | Status: AC
  Administered 2021-06-27: 4 mg via INTRAVENOUS
  Filled 2021-06-27: qty 1

## 2021-06-27 MED ORDER — ALBUTEROL SULFATE (2.5 MG/3ML) 0.083% IN NEBU
2.5000 mg | INHALATION_SOLUTION | RESPIRATORY_TRACT | Status: DC | PRN
  Administered 2021-07-03 (×3): 2.5 mg via RESPIRATORY_TRACT
  Filled 2021-06-27 (×3): qty 3

## 2021-06-27 MED ORDER — ONDANSETRON HCL 4 MG PO TABS: 4.0000 mg | ORAL_TABLET | Freq: Four times a day (QID) | ORAL | Status: DC | PRN

## 2021-06-27 MED ORDER — SODIUM CHLORIDE 0.9 % IV SOLN: INTRAVENOUS | Status: DC

## 2021-06-27 MED ORDER — ACETAMINOPHEN 650 MG RE SUPP
650.0000 mg | Freq: Four times a day (QID) | RECTAL | Status: DC | PRN
  Administered 2021-07-09: 650 mg via RECTAL
  Filled 2021-06-27: qty 1

## 2021-06-27 MED ORDER — METRONIDAZOLE 500 MG/100ML IV SOLN
500.0000 mg | Freq: Once | INTRAVENOUS | Status: AC
  Administered 2021-06-27: 500 mg via INTRAVENOUS
  Filled 2021-06-27: qty 100

## 2021-06-27 MED ORDER — ASPIRIN EC 81 MG PO TBEC
81.0000 mg | DELAYED_RELEASE_TABLET | Freq: Every day | ORAL | Status: DC
  Administered 2021-06-28 – 2021-07-21 (×24): 81 mg via ORAL
  Filled 2021-06-27 (×27): qty 1

## 2021-06-27 MED ORDER — CIPROFLOXACIN IN D5W 400 MG/200ML IV SOLN
400.0000 mg | Freq: Once | INTRAVENOUS | Status: AC
  Administered 2021-06-27: 400 mg via INTRAVENOUS
  Filled 2021-06-27: qty 200

## 2021-06-27 MED ORDER — ATORVASTATIN CALCIUM 10 MG PO TABS
10.0000 mg | ORAL_TABLET | Freq: Every evening | ORAL | Status: DC
  Administered 2021-06-27: 10 mg via ORAL
  Filled 2021-06-27: qty 1

## 2021-06-27 MED ORDER — HEPARIN SODIUM (PORCINE) 5000 UNIT/ML IJ SOLN
5000.0000 [IU] | Freq: Three times a day (TID) | INTRAMUSCULAR | Status: DC
  Administered 2021-06-27 – 2021-07-21 (×68): 5000 [IU] via SUBCUTANEOUS
  Filled 2021-06-27 (×70): qty 1

## 2021-06-27 NOTE — ED Triage Notes (Signed)
Pt brought in by [facility] from home with c/o AMS. Son went to check on pt this morning and noticed he was confused. Per EMS-strong urine smell on pt, pt weak with pain to buttocks, CBG 105.

## 2021-06-27 NOTE — ED Notes (Signed)
Upon entering room patient has removed his gown, IV, BP cuff, all ekg leads, and pulse oximeter.

## 2021-06-27 NOTE — ED Notes (Signed)
PT placed in teal mitts due to pulling at IV lines

## 2021-06-27 NOTE — ED Notes (Signed)
Patients shirt, life alert, wallet, and watch all placed in patient belonging bag and sitting at the bedside.

## 2021-06-27 NOTE — ED Provider Notes (Signed)
[facility] EMERGENCY DEPARTMENT Provider Note   CSN: [id number] Arrival date & time: 06/27/21  1102     History Chief Complaint  Patient presents with   Altered Mental Status    Lucas [name] is a 75 y.o. male.  Patient brought in by [facility] from home.  Due to acute mental status change.  Son went to check on patient this morning noticed he was confused.  Patient had a strong urine smell.  Patient's CBG was 105.  Patient clinically appears dehydrated.  Apparently patient lives by himself.  Patient not even able to get out of bed.      Past Medical History:  Diagnosis Date   Acute CVA (cerebrovascular accident) ([place]) 06/06/2015   Acute ischemic stroke ([place]) 01/19/2015   Left midbrain/thalamus.   AKI (acute kidney injury) ([place]) 03/27/2020   Alcohol use    Anxiety    Cataract    Cerebral ventriculomegaly 01/20/2015   COPD (chronic obstructive pulmonary disease) (HCC)    Depression    Depression with anxiety    Hyperlipidemia    Hypertension    Neuromuscular disorder (HCC)    Parkinson's disease ([place])    Substance abuse ([place])    Tobacco abuse     Patient Active Problem List   Diagnosis Date Noted   Hypotension 03/27/2020   Left leg pain 03/27/2020   AKI (acute kidney injury) ([place]) 03/27/2020   Cellulitis 03/27/2020   Restless leg syndrome 03/27/2020   Stroke ([place]) 01/25/2017   Essential hypertension 06/06/2015   Hyperlipidemia 06/06/2015   COPD (chronic obstructive pulmonary disease) ([place]) 06/06/2015   Multiple falls 06/05/2015   Cerebral ventriculomegaly 01/20/2015   Facial droop 01/19/2015   Tobacco abuse 01/19/2015   Obesity 01/19/2015   Depression with anxiety 01/19/2015   Hyperglycemia 01/19/2015   Dysarthria 01/15/2015   Parkinson's disease ([place])     Past Surgical History:  Procedure Laterality Date   APPENDECTOMY     EYE SURGERY     SHOULDER SURGERY         Family History  Problem Relation Age of Onset   Cancer Father        lung    Cancer Brother        lung   Early death Brother        suicide   Early death Brother        mva   COPD Sister    Diabetes Sister    Heart disease Sister    Early death Sister        fire    Social History   Tobacco Use   Smoking status: Every Day    Packs/day: 1.00    Types: Cigarettes   Smokeless tobacco: Never  Vaping Use   Vaping Use: Never used  Substance Use Topics   Alcohol use: No    Comment: former   Drug use: No    Home Medications Prior to Admission medications   Medication Sig Start Date End Date Taking? Authorizing Provider  albuterol (PROVENTIL HFA;VENTOLIN HFA) 108 (90 Base) MCG/ACT inhaler Inhale 1 puff into the lungs every 4 (four) hours as needed for wheezing or shortness of breath.   Yes [provider]  aspirin EC 81 MG EC tablet Take 1 tablet (81 mg total) by mouth daily. 01/16/15  Yes Isaac Bliss, [name], MD  atorvastatin (LIPITOR) 10 MG tablet Take 10 mg by mouth every evening.  04/27/15  Yes [provider]  busPIRone (BUSPAR) 10  MG tablet Take 10 mg by mouth 2 (two) times daily. 06/27/21  Yes [provider]  carbidopa-levodopa (SINEMET IR) 25-100 MG tablet Take 1 tablet by mouth 3 (three) times daily. 11/15/19  Yes [provider]  cholecalciferol (VITAMIN D3) 25 MCG (1000 UNIT) tablet Take 1,000 Units by mouth daily.   Yes [provider]  famotidine (PEPCID) 20 MG tablet Take 20 mg by mouth 2 (two) times daily. 06/27/21  Yes [provider]  furosemide (LASIX) 80 MG tablet Take 80 mg by mouth 2 (two) times daily. 06/27/21  Yes [provider]  gabapentin (NEURONTIN) 300 MG capsule Take 600 mg by mouth 2 (two) times daily. 06/27/21  Yes [provider]  hydrochlorothiazide (HYDRODIURIL) 25 MG tablet Take 25 mg by mouth daily. 11/09/19  Yes [provider]  ipratropium-albuterol (DUONEB) 0.5-2.5 (3) MG/3ML SOLN SMARTSIG:3 Milliliter(s) Via Nebulizer Every 6 Hours 12/14/19   Yes [provider]  lisinopril (ZESTRIL) 20 MG tablet Take 20 mg by mouth daily. 11/15/19  Yes [provider]  morphine (MS CONTIN) 15 MG 12 hr tablet Take 15 mg by mouth 2 (two) times daily as needed. 06/27/21  Yes [provider]  PARoxetine (PAXIL) 40 MG tablet Take 60 mg by mouth every morning.    Yes [provider]  rOPINIRole (REQUIP) 3 MG tablet Take 3 mg by mouth in the morning, at noon, and at bedtime.    Yes [provider]  vitamin B-12 (CYANOCOBALAMIN) 1000 MCG tablet Take 1,000 mcg by mouth daily.   Yes [provider]  clonazePAM (KLONOPIN) 1 MG tablet Take 1 mg by mouth daily as needed. 12/11/19   [provider]  furosemide (LASIX) 20 MG tablet Take 2 tablets (40 mg total) by mouth daily for 14 days. 03/04/21 03/18/21  Noemi Chapel, MD  Infant Care Products [facility]) CREA Apply 1 application topically daily.  11/25/19   [provider]  predniSONE (DELTASONE) 50 MG tablet 1 tablet PO QD X4 days Patient not taking: Reported on 06/27/2021 06/09/21   Ripley Fraise, MD    Allergies    Patient has no known allergies.  Review of Systems   Review of Systems  Unable to perform ROS: Mental status change   Physical Exam Updated Vital Signs BP (!) 161/117   Pulse 74   Temp 98.3 F (36.8 C) (Oral)   Resp 20   Ht 1.727 m (5\' 8" )   Wt 90.7 kg   SpO2 94%   BMI 30.40 kg/m   Physical Exam Vitals and nursing note reviewed.  Constitutional:      Appearance: He is well-developed. He is ill-appearing.  HENT:     Head: Normocephalic and atraumatic.     Mouth/Throat:     Mouth: Mucous membranes are dry.  Eyes:     Conjunctiva/sclera: Conjunctivae normal.  Cardiovascular:     Rate and Rhythm: Normal rate and regular rhythm.     Heart sounds: No murmur heard. Pulmonary:     Effort: Pulmonary effort is normal. No respiratory distress.     Breath sounds: Normal breath sounds.  Abdominal:     Palpations:  Abdomen is soft.     Tenderness: There is no abdominal tenderness.  Musculoskeletal:        General: No swelling.     Cervical back: Neck supple.  Skin:    General: Skin is warm and dry.  Neurological:     Mental Status: He is alert.  Comments: Patient confused.  Will not move left lower extremity very good.  Has some movement of left upper extremity.    ED Results / Procedures / Treatments   Labs (all labs ordered are listed, but only abnormal results are displayed) Labs Reviewed  COMPREHENSIVE METABOLIC PANEL - Abnormal; Notable for the following components:      Result Value   Chloride 96 (*)    BUN 67 (*)    Creatinine, Ser 4.61 (*)    Calcium 8.7 (*)    AST 207 (*)    Total Bilirubin 2.4 (*)    GFR, Estimated 13 (*)    Anion gap 18 (*)    All other components within normal limits  CBC - Abnormal; Notable for the following components:   WBC 13.4 (*)    All other components within normal limits  URINALYSIS, ROUTINE W REFLEX MICROSCOPIC - Abnormal; Notable for the following components:   Hgb urine dipstick LARGE (*)    Ketones, ur 5 (*)    Bacteria, UA RARE (*)    All other components within normal limits  RESP PANEL BY RT-PCR (FLU A&B, COVID) ARPGX2  LIPASE, BLOOD  ETHANOL    EKG EKG Interpretation  Date/Time:  Tuesday June 27 2021 12:01:41 EDT Ventricular Rate:  73 PR Interval:  180 QRS Duration: 100 QT Interval:  440 QTC Calculation: 485 R Axis:   65 Text Interpretation: Sinus rhythm Minimal ST depression, lateral leads Borderline prolonged QT interval No significant change since last tracing Confirmed by Fredia Sorrow [phone]) on 06/27/2021 12:29:43 PM  Radiology CT ABDOMEN PELVIS WO CONTRAST  Result Date: 06/27/2021 CLINICAL DATA:  Abdominal pain. Strong urine smell. Elevated creatinine. EXAM: CT ABDOMEN AND PELVIS WITHOUT CONTRAST TECHNIQUE: Multidetector CT imaging of the abdomen and pelvis was performed following the standard protocol without  IV contrast. COMPARISON:  11/03/2017. FINDINGS: Lower chest: No acute abnormality. Hepatobiliary: No focal liver abnormality is seen. No gallstones, gallbladder wall thickening, or biliary dilatation. Pancreas: Unremarkable. No pancreatic ductal dilatation or surrounding inflammatory changes. Spleen: Normal in size without focal abnormality. Adrenals/Urinary Tract: No adrenal masses. No right kidney. Mild left renal cortical thinning. No renal stone or hydronephrosis. Normal left ureter. Dilated tubular structure extends from just below the right adrenal gland to the right posterior bladder at the level of the ureteral trigone. This dilated ureter is contiguous with a bladder filling defect, also of the same attenuation. This appearance is concerning for a bladder malignancy, but is unchanged from the 11/03/2017 exam strongly favoring a benign etiology. Bladder is minimally distended, otherwise unremarkable. Stomach/Bowel: Normal stomach. Small bowel and colon are normal in caliber. No wall thickening. Subtle hazy opacity lies adjacent to the proximal to mid sigmoid colon where there are also multiple diverticula. This is not evident on the prior CT. No other evidence of pericolonic inflammation. Normal appendix is visualized. Vascular/Lymphatic: Aortic atherosclerosis. No aneurysm. No enlarged lymph nodes. Reproductive: Prostate is normal in size. Other: Small fat containing left inguinal hernia.  No ascites. Musculoskeletal: No fracture or acute finding.  No bone lesion. IMPRESSION: 1. Subtle hazy opacity lies adjacent to the proximal to mid sigmoid colon, where there are multiple diverticula. This is an equivocal finding, but mild uncomplicated diverticulitis should be consider if this correlates clinically. No other evidence of an acute abnormality within the abdomen or pelvis. 2. Dilated tubular structure that is contiguous with a mass projecting within the bladder. There is no visualized right kidney,  presumably congenitally absent. The dilated  tubular structure is likely the right gonadal vein. However, the course parallels the expected course of a right ureter. Both the bladder mass and the dilated tubular structure are stable compared to the 11/03/2017 CT. 3. Aortic atherosclerosis. Electronically Signed   By: Lajean Manes M.D.   On: 06/27/2021 13:49   CT Head Wo Contrast  Result Date: 06/27/2021 CLINICAL DATA:  Altered mental status. EXAM: CT HEAD WITHOUT CONTRAST TECHNIQUE: Contiguous axial images were obtained from the base of the skull through the vertex without intravenous contrast. COMPARISON:  December 21, 2019. FINDINGS: Brain: Mild chronic ischemic white matter disease is noted. Stable mild ventricular prominence is noted which may be due to mild cortical atrophy. No mass effect or midline shift is noted. There is no evidence of mass lesion, hemorrhage or acute infarction. Vascular: No hyperdense vessel or unexpected calcification. Skull: Normal. Negative for fracture or focal lesion. Sinuses/Orbits: No acute finding. Other: None. IMPRESSION: No acute intracranial abnormality seen. Stable mild ventricular dilatation as described above. Electronically Signed   By: Marijo Conception M.D.   On: 06/27/2021 13:26   DG Chest Port 1 View  Result Date: 06/27/2021 CLINICAL DATA:  Altered mental status in a 75 year old male. EXAM: PORTABLE CHEST 1 VIEW COMPARISON:  June 09, 2021. FINDINGS: EKG leads project over the chest. Trachea midline. Cardiomediastinal contours and hilar structures stable. Linear opacity present over the LEFT hemidiaphragm. No visible pneumothorax. No sign of pleural effusion. On limited assessment no acute skeletal process. IMPRESSION: Linear opacity over the LEFT hemidiaphragm may represent atelectasis or scarring. No acute cardiopulmonary process. Electronically Signed   By: Zetta Bills M.D.   On: 06/27/2021 12:10    Procedures Procedures   Medications Ordered in  ED Medications  0.9 %  sodium chloride infusion ( Intravenous New Bag/Given 06/27/21 1201)    ED Course  I have reviewed the triage vital signs and the nursing notes.  Pertinent labs & imaging results that were available during my care of the patient were reviewed by me and considered in my medical decision making (see chart for details).    MDM Rules/Calculators/A&P                         CRITICAL CARE Performed by: Fredia Sorrow Total critical care time: 35 minutes Critical care time was exclusive of separately billable procedures and treating other patients. Critical care was necessary to treat or prevent imminent or life-threatening deterioration. Critical care was time spent personally by me on the following activities: development of treatment plan with patient and/or surrogate as well as nursing, discussions with consultants, evaluation of patient's response to treatment, examination of patient, obtaining history from patient or surrogate, ordering and performing treatments and interventions, ordering and review of laboratory studies, ordering and review of radiographic studies, pulse oximetry and re-evaluation of patient's condition.   Patient's work-up patient clearly has some mental confusion.  Urinalysis not consistent with urinary tract infection we thought it would be.  Chest x-ray without evidence of pneumonia.  CT scan abdomen pelvis pending.  Electrolytes show evidence of an acute kidney injury that significantly goes along with the patient appearing dehydrated.  Will gentle hydration.  COVID testing influenza testing negative.  Patient's BUN was 67 creatinine was 4.61 significant change in his renal function.  She has a leukocytosis with a white count of 13.4  CT abdomen pelvis back subtle hazy opacity lies adjacent to the proximal to mid sigmoid colon where there  are multiple diverticuli this is equivocal finding but mild uncomplicated diverticulitis should be considered.   They are presuming that the right kidney is congenitally absent.  These findings are stable compared to CT in 2019 however.  Based on the questionable diverticulitis.  Will start on antibiotics.  We will adjust for renal function.  Patient will require admission.  In addition patient may require placement following the admission patient probably not able to take care of himself at home properly.  Final Clinical Impression(s) / ED Diagnoses Final diagnoses:  Altered mental status, unspecified altered mental status type  AKI (acute kidney injury) ([place])  Diverticulitis    Rx / DC Orders ED Discharge Orders     None        Fredia Sorrow, MD 06/27/21 1409

## 2021-06-27 NOTE — ED Provider Notes (Signed)
[facility] EMERGENCY DEPARTMENT Provider Note   CSN: [id number] Arrival date & time: 06/27/21  1102     History Chief Complaint  Patient presents with   Altered Mental Status    Lucas [name] is a 75 y.o. male.  Duplicate note      Past Medical History:  Diagnosis Date   Acute CVA (cerebrovascular accident) ([place]) 06/06/2015   Acute ischemic stroke ([place]) 01/19/2015   Left midbrain/thalamus.   AKI (acute kidney injury) ([place]) 03/27/2020   Alcohol use    Anxiety    Cataract    Cerebral ventriculomegaly 01/20/2015   COPD (chronic obstructive pulmonary disease) (HCC)    Depression    Depression with anxiety    Hyperlipidemia    Hypertension    Neuromuscular disorder (HCC)    Parkinson's disease ([place])    Substance abuse ([place])    Tobacco abuse     Patient Active Problem List   Diagnosis Date Noted   Hypotension 03/27/2020   Left leg pain 03/27/2020   AKI (acute kidney injury) ([place]) 03/27/2020   Cellulitis 03/27/2020   Restless leg syndrome 03/27/2020   Stroke ([place]) 01/25/2017   Essential hypertension 06/06/2015   Hyperlipidemia 06/06/2015   COPD (chronic obstructive pulmonary disease) ([place]) 06/06/2015   Multiple falls 06/05/2015   Cerebral ventriculomegaly 01/20/2015   Facial droop 01/19/2015   Tobacco abuse 01/19/2015   Obesity 01/19/2015   Depression with anxiety 01/19/2015   Hyperglycemia 01/19/2015   Dysarthria 01/15/2015   Parkinson's disease ([place])     Past Surgical History:  Procedure Laterality Date   APPENDECTOMY     EYE SURGERY     SHOULDER SURGERY         Family History  Problem Relation Age of Onset   Cancer Father        lung   Cancer Brother        lung   Early death Brother        suicide   Early death Brother        mva   COPD Sister    Diabetes Sister    Heart disease Sister    Early death Sister        fire    Social History   Tobacco Use   Smoking status: Every Day    Packs/day: 1.00    Types: Cigarettes    Smokeless tobacco: Never  Vaping Use   Vaping Use: Never used  Substance Use Topics   Alcohol use: No    Comment: former   Drug use: No    Home Medications Prior to Admission medications   Medication Sig Start Date End Date Taking? Authorizing Provider  albuterol (PROVENTIL HFA;VENTOLIN HFA) 108 (90 Base) MCG/ACT inhaler Inhale 1 puff into the lungs every 4 (four) hours as needed for wheezing or shortness of breath.    [provider]  aspirin EC 81 MG EC tablet Take 1 tablet (81 mg total) by mouth daily. 01/16/15   Isaac Bliss, [name], MD  atorvastatin (LIPITOR) 10 MG tablet Take 10 mg by mouth every evening.  04/27/15   [provider]  carbidopa-levodopa (SINEMET IR) 25-100 MG tablet Take 1 tablet by mouth 3 (three) times daily. 11/15/19   [provider]  clonazePAM (KLONOPIN) 1 MG tablet Take 1 mg by mouth daily as needed. 12/11/19   [provider]  FAMOTIDINE PO Take 1 tablet by mouth daily.    [provider]  furosemide (LASIX) 20 MG tablet  Take 2 tablets (40 mg total) by mouth daily for 14 days. 03/04/21 03/18/21  Noemi Chapel, MD  hydrochlorothiazide (HYDRODIURIL) 25 MG tablet Take 25 mg by mouth daily. 11/09/19   [provider]  Infant Care Products (DERMACLOUD) CREA Apply 1 application topically daily.  11/25/19   [provider]  ipratropium-albuterol (DUONEB) 0.5-2.5 (3) MG/3ML SOLN SMARTSIG:3 Milliliter(s) Via Nebulizer Every 6 Hours 12/14/19   [provider]  lisinopril (ZESTRIL) 20 MG tablet Take 20 mg by mouth daily. 11/15/19   [provider]  PARoxetine (PAXIL) 40 MG tablet Take 60 mg by mouth every morning.     [provider]  predniSONE (DELTASONE) 50 MG tablet 1 tablet PO QD X4 days 06/09/21   Ripley Fraise, MD  rOPINIRole (REQUIP) 3 MG tablet Take 3 mg by mouth in the morning, at noon, and at bedtime.     [provider]    Allergies    Patient has no known  allergies.  Review of Systems   Review of Systems  Physical Exam Updated Vital Signs Ht 1.727 m (5\' 8" )   Wt 90.7 kg   BMI 30.40 kg/m   Physical Exam  ED Results / Procedures / Treatments   Labs (all labs ordered are listed, but only abnormal results are displayed) Labs Reviewed - No data to display  EKG None  Radiology No results found.  Procedures Procedures   Medications Ordered in ED Medications - No data to display  ED Course  I have reviewed the triage vital signs and the nursing notes.  Pertinent labs & imaging results that were available during my care of the patient were reviewed by me and considered in my medical decision making (see chart for details).    MDM Rules/Calculators/A&P                           Duplicate note Final Clinical Impression(s) / ED Diagnoses Final diagnoses:  None    Rx / DC Orders ED Discharge Orders     None        Fredia Sorrow, MD 07/14/21 [phone]

## 2021-06-27 NOTE — H&P (Signed)
History and Physical    MARSH HECKLER KKX:[id number] DOB: 07/29/1946 DOA: 06/27/2021  PCP: Lucas Flavors, MD  Patient coming from: Home  I have personally briefly reviewed patient's old medical records in [place]  Chief Complaint: Altered mental status  HPI: Lucas [name] is a 75 y.o. male with medical history significant of Parkinson's disease, prior CVA, COPD, tobacco use, hypertension, chronic lower extremity edema, hyperlipidemia, normally lives at home.  Patient's son reports that patient is normally at home by himself, and family checks on him frequently.  He normally can transfer from bed/recliner to his wheelchair and is able to propel himself in a wheelchair.  Mentally, he is normally appropriate in his conversation, although he does have intermittent delusions/hallucinations.  He is able to feed himself.  Medications are organized by bubble pack and his son tries to make sure patient takes his meds regularly.  He is still smoking.  He has had a chronic decline.  Family has also mentioned the possibility of going to a skilled facility, the patient has been resistant.  His son reports that for the past few days, he has been increasingly weak.  He has been unable to transfer out of bed and has been pretty consistently in bed.  His son noticed a strong smell to his urine.  He has not had any vomiting, diarrhea.  P.o. intake has been adequate, other than the last 24 hours where it has been decreased.  He has not had any fever, cough, shortness of breath.  He was increasingly lethargic over the past few days, and was not following commands.  Due to his worsening weakness and mental status changes, EMS was called.  ED Course: Patient was brought to the hospital where he was noted to be hemodynamically stable.  He was afebrile.  Oxygen saturations were normal on room air.  Labs showed that he had new acute kidney injury.  WBC count mildly elevated.  CT abdomen showed questionable  early diverticulitis, chronic bladder mass.  No other significant findings.  CT head was also nonacute.  Patient had become agitated in the emergency room and safety mitts were applied.  He has been referred for admission.  Review of Systems: Unable to accurately assess due to mental status   Past Medical History:  Diagnosis Date   Acute CVA (cerebrovascular accident) ([place]) 06/06/2015   Acute ischemic stroke ([place]) 01/19/2015   Left midbrain/thalamus.   AKI (acute kidney injury) ([place]) 03/27/2020   Alcohol use    Anxiety    Cataract    Cerebral ventriculomegaly 01/20/2015   COPD (chronic obstructive pulmonary disease) (HCC)    Depression    Depression with anxiety    Hyperlipidemia    Hypertension    Neuromuscular disorder (HCC)    Parkinson's disease ([place])    Substance abuse ([place])    Tobacco abuse     Past Surgical History:  Procedure Laterality Date   APPENDECTOMY     EYE SURGERY     SHOULDER SURGERY      Social History:  reports that he has been smoking cigarettes. He has been smoking an average of 1 pack per day. He has never used smokeless tobacco. He reports that he does not drink alcohol and does not use drugs.  No Known Allergies  Family History  Problem Relation Age of Onset   Cancer Father        lung   Cancer Brother        lung  Early death Brother        suicide   Early death Brother        mva   COPD Sister    Diabetes Sister    Heart disease Sister    Early death Sister        fire     Prior to Admission medications   Medication Sig Start Date End Date Taking? Authorizing Provider  albuterol (PROVENTIL HFA;VENTOLIN HFA) 108 (90 Base) MCG/ACT inhaler Inhale 1 puff into the lungs every 4 (four) hours as needed for wheezing or shortness of breath.   Yes [provider]  aspirin EC 81 MG EC tablet Take 1 tablet (81 mg total) by mouth daily. 01/16/15  Yes Lucas [name], [name], MD  atorvastatin (LIPITOR) 10 MG tablet Take 10 mg by mouth  every evening.  04/27/15  Yes [provider]  busPIRone (BUSPAR) 10 MG tablet Take 10 mg by mouth 2 (two) times daily. 06/27/21  Yes [provider]  carbidopa-levodopa (SINEMET IR) 25-100 MG tablet Take 1 tablet by mouth 3 (three) times daily. 11/15/19  Yes [provider]  cholecalciferol (VITAMIN D3) 25 MCG (1000 UNIT) tablet Take 1,000 Units by mouth daily.   Yes [provider]  famotidine (PEPCID) 20 MG tablet Take 20 mg by mouth 2 (two) times daily. 06/27/21  Yes [provider]  furosemide (LASIX) 80 MG tablet Take 80 mg by mouth 2 (two) times daily. 06/27/21  Yes [provider]  gabapentin (NEURONTIN) 300 MG capsule Take 600 mg by mouth 2 (two) times daily. 06/27/21  Yes [provider]  hydrochlorothiazide (HYDRODIURIL) 25 MG tablet Take 25 mg by mouth daily. 11/09/19  Yes [provider]  ipratropium-albuterol (DUONEB) 0.5-2.5 (3) MG/3ML SOLN SMARTSIG:3 Milliliter(s) Via Nebulizer Every 6 Hours 12/14/19  Yes [provider]  lisinopril (ZESTRIL) 20 MG tablet Take 20 mg by mouth daily. 11/15/19  Yes [provider]  morphine (MS CONTIN) 15 MG 12 hr tablet Take 15 mg by mouth 2 (two) times daily as needed. 06/27/21  Yes [provider]  PARoxetine (PAXIL) 40 MG tablet Take 60 mg by mouth every morning.    Yes [provider]  rOPINIRole (REQUIP) 3 MG tablet Take 3 mg by mouth in the morning, at noon, and at bedtime.    Yes [provider]  vitamin B-12 (CYANOCOBALAMIN) 1000 MCG tablet Take 1,000 mcg by mouth daily.   Yes [provider]  clonazePAM (KLONOPIN) 1 MG tablet Take 1 mg by mouth daily as needed. 12/11/19   [provider]  furosemide (LASIX) 20 MG tablet Take 2 tablets (40 mg total) by mouth daily for 14 days. 03/04/21 03/18/21  Noemi Chapel, MD  Infant Care Products [facility]) CREA Apply 1 application topically daily.  11/25/19   [provider]     Physical Exam: Vitals:   06/27/21 1230 06/27/21 1330 06/27/21 1508 06/27/21 1546  BP: (!) 140/112 (!) 161/117 (!) 150/96 (!) 106/94  Pulse: 73 74 74 75  Resp: 13 20 18    Temp:   97.7 F (36.5 C) 98.2 F (36.8 C)  TempSrc:   Oral Oral  SpO2: 93% 94% 94% 98%  Weight:    81.2 kg  Height:        Constitutional: NAD, calm, comfortable Eyes: PERRL, lids and conjunctivae normal ENMT: Mucous membranes are dry.  Posterior pharynx clear of any exudate or lesions.Normal dentition.  Neck: normal, supple, no masses, no thyromegaly Respiratory:  clear to auscultation bilaterally, no wheezing, no crackles. Normal respiratory effort. No accessory muscle use.  Cardiovascular: Regular rate and rhythm, no murmurs / rubs / gallops. No extremity edema. 2+ pedal pulses. No carotid bruits.  Abdomen: no tenderness, no masses palpated. No hepatosplenomegaly. Bowel sounds positive.  Musculoskeletal: no clubbing / cyanosis. No joint deformity upper and lower extremities. Good ROM, no contractures. Normal muscle tone.  Skin: no rashes, lesions, ulcers. No induration Neurologic: Difficult exam since patient does not follow commands or participate.  He is moving both upper and lower extremities spontaneously.  No facial asymmetry Psychiatric: Somnolent, confused   Labs on Admission: I have personally reviewed following labs and imaging studies  CBC: Recent Labs  Lab 06/27/21 1140  WBC 13.4*  HGB 15.6  HCT 46.6  MCV 95.7  PLT 034   Basic Metabolic Panel: Recent Labs  Lab 06/27/21 1140  NA 139  K 4.5  CL 96*  CO2 25  GLUCOSE 87  BUN 67*  CREATININE 4.61*  CALCIUM 8.7*   GFR: Estimated Creatinine Clearance: 13.8 mL/min (A) (by C-G formula based on SCr of 4.61 mg/dL (H)). Liver Function Tests: Recent Labs  Lab 06/27/21 1140  AST 207*  ALT 31  ALKPHOS 82  BILITOT 2.4*  PROT 8.1  ALBUMIN 4.4   Recent Labs  Lab 06/27/21 1140  LIPASE 22   No results for input(s): AMMONIA in the  last 168 hours. Coagulation Profile: No results for input(s): INR, PROTIME in the last 168 hours. Cardiac Enzymes: No results for input(s): CKTOTAL, CKMB, CKMBINDEX, TROPONINI in the last 168 hours. BNP (last 3 results) No results for input(s): PROBNP in the last 8760 hours. HbA1C: No results for input(s): HGBA1C in the last 72 hours. CBG: No results for input(s): GLUCAP in the last 168 hours. Lipid Profile: No results for input(s): CHOL, HDL, LDLCALC, TRIG, CHOLHDL, LDLDIRECT in the last 72 hours. Thyroid Function Tests: No results for input(s): TSH, T4TOTAL, FREET4, T3FREE, THYROIDAB in the last 72 hours. Anemia Panel: No results for input(s): VITAMINB12, FOLATE, FERRITIN, TIBC, IRON, RETICCTPCT in the last 72 hours. Urine analysis:    Component Value Date/Time   COLORURINE YELLOW 06/27/2021 1140   APPEARANCEUR CLEAR 06/27/2021 1140   LABSPEC 1.012 06/27/2021 1140   PHURINE 5.0 06/27/2021 1140   GLUCOSEU NEGATIVE 06/27/2021 1140   HGBUR LARGE (A) 06/27/2021 1140   BILIRUBINUR NEGATIVE 06/27/2021 1140   KETONESUR 5 (A) 06/27/2021 1140   PROTEINUR NEGATIVE 06/27/2021 1140   UROBILINOGEN 0.2 06/05/2015 1821   NITRITE NEGATIVE 06/27/2021 1140   LEUKOCYTESUR NEGATIVE 06/27/2021 1140    Radiological Exams on Admission: CT ABDOMEN PELVIS WO CONTRAST  Result Date: 06/27/2021 CLINICAL DATA:  Abdominal pain. Strong urine smell. Elevated creatinine. EXAM: CT ABDOMEN AND PELVIS WITHOUT CONTRAST TECHNIQUE: Multidetector CT imaging of the abdomen and pelvis was performed following the standard protocol without IV contrast. COMPARISON:  11/03/2017. FINDINGS: Lower chest: No acute abnormality. Hepatobiliary: No focal liver abnormality is seen. No gallstones, gallbladder wall thickening, or biliary dilatation. Pancreas: Unremarkable. No pancreatic ductal dilatation or surrounding inflammatory changes. Spleen: Normal in size without focal abnormality. Adrenals/Urinary Tract: No adrenal masses.  No right kidney. Mild left renal cortical thinning. No renal stone or hydronephrosis. Normal left ureter. Dilated tubular structure extends from just below the right adrenal gland to the right posterior bladder at the level of the ureteral trigone. This dilated ureter is contiguous with a bladder filling defect, also of the same attenuation. This appearance is concerning for  a bladder malignancy, but is unchanged from the 11/03/2017 exam strongly favoring a benign etiology. Bladder is minimally distended, otherwise unremarkable. Stomach/Bowel: Normal stomach. Small bowel and colon are normal in caliber. No wall thickening. Subtle hazy opacity lies adjacent to the proximal to mid sigmoid colon where there are also multiple diverticula. This is not evident on the prior CT. No other evidence of pericolonic inflammation. Normal appendix is visualized. Vascular/Lymphatic: Aortic atherosclerosis. No aneurysm. No enlarged lymph nodes. Reproductive: Prostate is normal in size. Other: Small fat containing left inguinal hernia.  No ascites. Musculoskeletal: No fracture or acute finding.  No bone lesion. IMPRESSION: 1. Subtle hazy opacity lies adjacent to the proximal to mid sigmoid colon, where there are multiple diverticula. This is an equivocal finding, but mild uncomplicated diverticulitis should be consider if this correlates clinically. No other evidence of an acute abnormality within the abdomen or pelvis. 2. Dilated tubular structure that is contiguous with a mass projecting within the bladder. There is no visualized right kidney, presumably congenitally absent. The dilated tubular structure is likely the right gonadal vein. However, the course parallels the expected course of a right ureter. Both the bladder mass and the dilated tubular structure are stable compared to the 11/03/2017 CT. 3. Aortic atherosclerosis. Electronically Signed   By: Lajean Manes M.D.   On: 06/27/2021 13:49   CT Head Wo Contrast  Result  Date: 06/27/2021 CLINICAL DATA:  Altered mental status. EXAM: CT HEAD WITHOUT CONTRAST TECHNIQUE: Contiguous axial images were obtained from the base of the skull through the vertex without intravenous contrast. COMPARISON:  December 21, 2019. FINDINGS: Brain: Mild chronic ischemic white matter disease is noted. Stable mild ventricular prominence is noted which may be due to mild cortical atrophy. No mass effect or midline shift is noted. There is no evidence of mass lesion, hemorrhage or acute infarction. Vascular: No hyperdense vessel or unexpected calcification. Skull: Normal. Negative for fracture or focal lesion. Sinuses/Orbits: No acute finding. Other: None. IMPRESSION: No acute intracranial abnormality seen. Stable mild ventricular dilatation as described above. Electronically Signed   By: Marijo Conception M.D.   On: 06/27/2021 13:26   DG Chest Port 1 View  Result Date: 06/27/2021 CLINICAL DATA:  Altered mental status in a 75 year old male. EXAM: PORTABLE CHEST 1 VIEW COMPARISON:  June 09, 2021. FINDINGS: EKG leads project over the chest. Trachea midline. Cardiomediastinal contours and hilar structures stable. Linear opacity present over the LEFT hemidiaphragm. No visible pneumothorax. No sign of pleural effusion. On limited assessment no acute skeletal process. IMPRESSION: Linear opacity over the LEFT hemidiaphragm may represent atelectasis or scarring. No acute cardiopulmonary process. Electronically Signed   By: Zetta Bills M.D.   On: 06/27/2021 12:10    EKG: Independently reviewed.  Sinus rhythm without acute changes  Assessment/Plan Active Problems:   Parkinson's disease (HCC)   Generalized weakness   Essential hypertension   Hyperlipidemia   COPD (chronic obstructive pulmonary disease) (HCC)   AKI (acute kidney injury) (HCC)   Acute metabolic encephalopathy   Bladder mass     Acute kidney injury -Likely related to decreased p.o. intake in the setting of ongoing diuretic use and  lisinopril. -Imaging of the abdomen did not reveal any hydronephrosis -We will start on IV fluids and monitor urine output/renal function  Acute metabolic encephalopathy -I suspect this is related to renal failure -He is also on several sedative medications which are likely have an impact on his mental status due to decreased clearance and renal failure -  Hold all sedating meds. -We will also check TSH, ammonia and ABG -He does not have any obvious focal deficits -If mental status fails to improve with correction of renal issues, could consider MRI brain  Hypertension -Holding antihypertensives now since he was likely hypovolemic -Can consider resuming once volume issues have stabilized  Parkinson's disease -Continue on Sinemet  Hyperlipidemia -Continue statin  Bladder mass -Appears to have been present on CT imaging from 2019 and appears unchanged -This can be followed outpatient by urology  COPD -No wheezing or shortness of breath at this time -Continue as needed bronchodilators  Generalized weakness -Physical therapy evaluation -May need skilled nursing facility placement if agreeable  Goals of care -Discussed with patient's son who reports that currently patient is full code -His son also did mention that patient is being followed by hospice, and may have an aide to help with his bathing -He did stress the patient is currently not on comfort care -We will need further information from participating hospice.  DVT prophylaxis: Heparin Code Status: Full code Family Communication: Discussed with patient's son, Lucas [name] over the phone Disposition Plan: Family is requesting skilled nursing facility placement on discharge.  Would prefer [facility] in [place] called:   Admission status: Observation, telemetry  Kathie Dike MD [facility]   If 7PM-7AM, please contact night-coverage [email or web address]   06/27/2021, 6:23 PM

## 2021-06-28 ENCOUNTER — Encounter (HOSPITAL_COMMUNITY): Payer: Self-pay | Admitting: Internal Medicine

## 2021-06-28 DIAGNOSIS — Z515 Encounter for palliative care: Secondary | ICD-10-CM

## 2021-06-28 DIAGNOSIS — E875 Hyperkalemia: Secondary | ICD-10-CM | POA: Diagnosis not present

## 2021-06-28 DIAGNOSIS — G20 Parkinson's disease: Secondary | ICD-10-CM

## 2021-06-28 DIAGNOSIS — F05 Delirium due to known physiological condition: Secondary | ICD-10-CM | POA: Diagnosis not present

## 2021-06-28 DIAGNOSIS — N170 Acute kidney failure with tubular necrosis: Secondary | ICD-10-CM | POA: Diagnosis not present

## 2021-06-28 DIAGNOSIS — L89326 Pressure-induced deep tissue damage of left buttock: Secondary | ICD-10-CM | POA: Diagnosis present

## 2021-06-28 DIAGNOSIS — F32A Depression, unspecified: Secondary | ICD-10-CM | POA: Diagnosis present

## 2021-06-28 DIAGNOSIS — E872 Acidosis, unspecified: Secondary | ICD-10-CM | POA: Diagnosis present

## 2021-06-28 DIAGNOSIS — F0282 Dementia in other diseases classified elsewhere, unspecified severity, with psychotic disturbance: Secondary | ICD-10-CM | POA: Diagnosis present

## 2021-06-28 DIAGNOSIS — N179 Acute kidney failure, unspecified: Principal | ICD-10-CM

## 2021-06-28 DIAGNOSIS — Z79899 Other long term (current) drug therapy: Secondary | ICD-10-CM | POA: Diagnosis not present

## 2021-06-28 DIAGNOSIS — E876 Hypokalemia: Secondary | ICD-10-CM | POA: Diagnosis not present

## 2021-06-28 DIAGNOSIS — N189 Chronic kidney disease, unspecified: Secondary | ICD-10-CM | POA: Diagnosis not present

## 2021-06-28 DIAGNOSIS — J441 Chronic obstructive pulmonary disease with (acute) exacerbation: Secondary | ICD-10-CM | POA: Diagnosis not present

## 2021-06-28 DIAGNOSIS — M6282 Rhabdomyolysis: Secondary | ICD-10-CM | POA: Diagnosis present

## 2021-06-28 DIAGNOSIS — G9341 Metabolic encephalopathy: Secondary | ICD-10-CM

## 2021-06-28 DIAGNOSIS — R531 Weakness: Secondary | ICD-10-CM | POA: Diagnosis not present

## 2021-06-28 DIAGNOSIS — Z7189 Other specified counseling: Secondary | ICD-10-CM

## 2021-06-28 DIAGNOSIS — J9601 Acute respiratory failure with hypoxia: Secondary | ICD-10-CM | POA: Diagnosis not present

## 2021-06-28 DIAGNOSIS — Z20822 Contact with and (suspected) exposure to covid-19: Secondary | ICD-10-CM | POA: Diagnosis present

## 2021-06-28 DIAGNOSIS — N181 Chronic kidney disease, stage 1: Secondary | ICD-10-CM | POA: Diagnosis not present

## 2021-06-28 DIAGNOSIS — N1831 Chronic kidney disease, stage 3a: Secondary | ICD-10-CM | POA: Diagnosis present

## 2021-06-28 DIAGNOSIS — Z66 Do not resuscitate: Secondary | ICD-10-CM | POA: Diagnosis present

## 2021-06-28 DIAGNOSIS — D649 Anemia, unspecified: Secondary | ICD-10-CM | POA: Diagnosis present

## 2021-06-28 DIAGNOSIS — N3289 Other specified disorders of bladder: Secondary | ICD-10-CM | POA: Diagnosis not present

## 2021-06-28 DIAGNOSIS — K5792 Diverticulitis of intestine, part unspecified, without perforation or abscess without bleeding: Secondary | ICD-10-CM | POA: Diagnosis present

## 2021-06-28 DIAGNOSIS — J9602 Acute respiratory failure with hypercapnia: Secondary | ICD-10-CM | POA: Diagnosis not present

## 2021-06-28 DIAGNOSIS — I129 Hypertensive chronic kidney disease with stage 1 through stage 4 chronic kidney disease, or unspecified chronic kidney disease: Secondary | ICD-10-CM | POA: Diagnosis present

## 2021-06-28 DIAGNOSIS — I10 Essential (primary) hypertension: Secondary | ICD-10-CM | POA: Diagnosis not present

## 2021-06-28 DIAGNOSIS — D72829 Elevated white blood cell count, unspecified: Secondary | ICD-10-CM | POA: Diagnosis present

## 2021-06-28 DIAGNOSIS — J449 Chronic obstructive pulmonary disease, unspecified: Secondary | ICD-10-CM | POA: Diagnosis not present

## 2021-06-28 DIAGNOSIS — F17210 Nicotine dependence, cigarettes, uncomplicated: Secondary | ICD-10-CM | POA: Diagnosis present

## 2021-06-28 DIAGNOSIS — L89626 Pressure-induced deep tissue damage of left heel: Secondary | ICD-10-CM | POA: Diagnosis present

## 2021-06-28 DIAGNOSIS — E785 Hyperlipidemia, unspecified: Secondary | ICD-10-CM | POA: Diagnosis present

## 2021-06-28 LAB — COMPREHENSIVE METABOLIC PANEL
ALT: 20 U/L (ref 0–44)
AST: 711 U/L — ABNORMAL HIGH (ref 15–41)
Albumin: 3.3 g/dL — ABNORMAL LOW (ref 3.5–5.0)
Alkaline Phosphatase: 61 U/L (ref 38–126)
Anion gap: 16 — ABNORMAL HIGH (ref 5–15)
BUN: 84 mg/dL — ABNORMAL HIGH (ref 8–23)
CO2: 21 mmol/L — ABNORMAL LOW (ref 22–32)
Calcium: 7.5 mg/dL — ABNORMAL LOW (ref 8.9–10.3)
Chloride: 102 mmol/L (ref 98–111)
Creatinine, Ser: 5.46 mg/dL — ABNORMAL HIGH (ref 0.61–1.24)
GFR, Estimated: 10 mL/min — ABNORMAL LOW (ref >60)
Glucose, Bld: 111 mg/dL — ABNORMAL HIGH (ref 70–99)
Potassium: 5.3 mmol/L — ABNORMAL HIGH (ref 3.5–5.1)
Sodium: 139 mmol/L (ref 135–145)
Total Bilirubin: 1.7 mg/dL — ABNORMAL HIGH (ref 0.3–1.2)
Total Protein: 6.6 g/dL (ref 6.5–8.1)

## 2021-06-28 LAB — CBC
HCT: 40.6 % (ref 39.0–52.0)
Hemoglobin: 13.4 g/dL (ref 13.0–17.0)
MCH: 32.1 pg (ref 26.0–34.0)
MCHC: 33 g/dL (ref 30.0–36.0)
MCV: 97.1 fL (ref 80.0–100.0)
Platelets: 202 10*3/uL (ref 150–400)
RBC: 4.18 MIL/uL — ABNORMAL LOW (ref 4.22–5.81)
RDW: 13.1 % (ref 11.5–15.5)
WBC: 13.5 10*3/uL — ABNORMAL HIGH (ref 4.0–10.5)
nRBC: 0 % (ref 0.0–0.2)

## 2021-06-28 LAB — BASIC METABOLIC PANEL
Anion gap: 17 — ABNORMAL HIGH (ref 5–15)
BUN: 91 mg/dL — ABNORMAL HIGH (ref 8–23)
CO2: 21 mmol/L — ABNORMAL LOW (ref 22–32)
Calcium: 7.6 mg/dL — ABNORMAL LOW (ref 8.9–10.3)
Chloride: 101 mmol/L (ref 98–111)
Creatinine, Ser: 5.72 mg/dL — ABNORMAL HIGH (ref 0.61–1.24)
GFR, Estimated: 10 mL/min — ABNORMAL LOW (ref >60)
Glucose, Bld: 121 mg/dL — ABNORMAL HIGH (ref 70–99)
Potassium: 4.4 mmol/L (ref 3.5–5.1)
Sodium: 139 mmol/L (ref 135–145)

## 2021-06-28 LAB — CK: Total CK: >50000 U/L — ABNORMAL HIGH (ref 49–397)

## 2021-06-28 MED ORDER — SODIUM CHLORIDE 0.9 % IV BOLUS
1000.0000 mL | Freq: Once | INTRAVENOUS | Status: AC
  Administered 2021-06-28: 1000 mL via INTRAVENOUS

## 2021-06-28 MED ORDER — NYSTATIN 100000 UNIT/GM EX POWD
Freq: Two times a day (BID) | CUTANEOUS | Status: DC
  Filled 2021-06-28 (×6): qty 15

## 2021-06-28 MED ORDER — INSULIN ASPART 100 UNIT/ML IV SOLN
10.0000 [IU] | Freq: Once | INTRAVENOUS | Status: AC
  Administered 2021-06-28: 10 [IU] via INTRAVENOUS

## 2021-06-28 MED ORDER — DEXTROSE 50 % IV SOLN
1.0000 | Freq: Once | INTRAVENOUS | Status: AC
  Administered 2021-06-28: 50 mL via INTRAVENOUS
  Filled 2021-06-28: qty 50

## 2021-06-28 MED ORDER — CALCIUM GLUCONATE-NACL 1-0.675 GM/50ML-% IV SOLN
1.0000 g | Freq: Once | INTRAVENOUS | Status: AC
  Administered 2021-06-28: 1000 mg via INTRAVENOUS
  Filled 2021-06-28: qty 50

## 2021-06-28 MED ORDER — SODIUM ZIRCONIUM CYCLOSILICATE 10 G PO PACK
10.0000 g | PACK | Freq: Once | ORAL | Status: AC
  Administered 2021-06-28: 10 g via ORAL
  Filled 2021-06-28: qty 1

## 2021-06-28 NOTE — Plan of Care (Signed)
  Problem: Elimination: Goal: Will not experience complications related to urinary retention 06/28/2021 2054 by Santa Lighter, RN Outcome: Progressing 06/28/2021 2054 by Santa Lighter, RN Outcome: Progressing

## 2021-06-28 NOTE — Plan of Care (Signed)
  Problem: Education: Goal: Knowledge of General Education information will improve Description: Including pain rating scale, medication(s)/side effects and non-pharmacologic comfort measures Outcome: Not Progressing   Problem: Health Behavior/Discharge Planning: Goal: Ability to manage health-related needs will improve Outcome: Not Progressing   Problem: Clinical Measurements: Goal: Ability to maintain clinical measurements within normal limits will improve Outcome: Not Progressing Goal: Will remain free from infection Outcome: Progressing Goal: Diagnostic test results will improve Outcome: Not Progressing Goal: Respiratory complications will improve Outcome: Progressing Goal: Cardiovascular complication will be avoided Outcome: Progressing   Problem: Activity: Goal: Risk for activity intolerance will decrease Outcome: Not Progressing   Problem: Nutrition: Goal: Adequate nutrition will be maintained Outcome: Progressing   Problem: Coping: Goal: Level of anxiety will decrease Outcome: Progressing   Problem: Elimination: Goal: Will not experience complications related to bowel motility Outcome: Progressing Goal: Will not experience complications related to urinary retention Outcome: Progressing   Problem: Pain Managment: Goal: General experience of comfort will improve Outcome: Progressing   Problem: Safety: Goal: Ability to remain free from injury will improve Outcome: Progressing   Problem: Skin Integrity: Goal: Risk for impaired skin integrity will decrease Outcome: Progressing

## 2021-06-28 NOTE — Consult Note (Signed)
Nephrology Consult   Assessment/Recommendations:  AKI: Likely secondary to hemodynamic insults from prolonged prerenal injury in the context of solitary kidney & lasix + HCTZ + lisinopril use. Likely has ATN at this point -checking CK level--UA positive with large blood but only 0-5 RBCs/HPF. Suspecting he has rhabdo (abnormal LFT's, UA findings, hypocal, unknown down time) -agree with holding lasix, HCTZ, and lisinopril for now -continue with isotonic fluids for now -given underlying parkinsons/chronic decline, would recommend palliative care consult. Would be hesitant on recommending renal replacement therapy given underlying comorbidities and decline in functional status. I expressed these concerns to Ronalee Belts (son) over the phone just now -Continue to monitor daily Cr, Dose meds for GFR<15 -Monitor Daily strict I/Os, Daily weight  -Maintain MAP>65 for optimal renal perfusion.  -avoid further nephrotoxins including NSAIDS, Morphine.  Unless absolutely necessary, avoid CT with contrast and/or MRI with gadolinium.     AMS -per primary. Secondary to morphine in AKI?  Leukocytosis -possible early diverticulitis? Received flagyl and cipro x 1 dose. Per primary  Parkinson's -per primary, recommend palliative care consult  Hypertension: -can consider beta blocker since lisinopril and HCTZ is being held  Hyperkalemia -restrict K in diet, agree with lokelma for today  Metabolic acidosis -bicarb slightly low, watch for now.   Bladder mass -unchanged in appearance since 2019, can be followed as an outpatient. Of note, he still smoking  Recommendations conveyed to primary service.   [facility] 06/28/2021 7:55 AM   _____________________________________________________________________________________   History of Present Illness: Lucas [name] is a/an 75 y.o. male with a past medical history of Parkinson's disease, history of CVA, COPD, smoker, hypertension,  chronic edema, hyperlipidemia who presents to [facility] with AMS/confusion.  Patient normally lives at home by himself, family checks in on him regularly.  Per H&P, has been increasingly weak over the last few days and has not been having much p.o. intake in the last 24 hours or so.  Son did report in the ER that he had a strong smell to his urine.  Has been increasingly lethargic and confused with worsening weakness in this interim.  CT abdomen without contrast showed signs of possible mild uncomplicated diverticulitis, absent right kidney, possible bladder malignancy which is unchanged from 11/03/2017, mild left renal cortical thinning with no renal stone or hydronephrosis, normal left ureter.  Has baseline creatinine around 1.2-1.4.  Presented with a creatinine of 4.6 on 11/1, received IV fluids and creatinine is 5.5 today.  Urine output not measured unfortunately. Apparently is being followed by hospice at home.  Discussed with Ronalee Belts (son) over the phone this AM. Apparently was found down on the floor by patient's sister, unknown down time.  ROS unobtainable--AMS.  Medications:  Current Facility-Administered Medications  Medication Dose Route Frequency Provider Last Rate Last Admin   0.9 %  sodium chloride infusion   Intravenous Continuous Kathie Dike, MD 100 mL/hr at 06/27/21 2232 New Bag at 06/27/21 2232   acetaminophen (TYLENOL) tablet 650 mg  650 mg Oral Q6H PRN Kathie Dike, MD   650 mg at 06/27/21 2021   Or   acetaminophen (TYLENOL) suppository 650 mg  650 mg Rectal Q6H PRN Kathie Dike, MD       albuterol (PROVENTIL) (2.5 MG/3ML) 0.083% nebulizer solution 2.5 mg  2.5 mg Nebulization Q2H PRN Kathie Dike, MD       aspirin EC tablet 81 mg  81 mg Oral Daily Kathie Dike, MD       atorvastatin (LIPITOR) tablet  10 mg  10 mg Oral QPM Kathie Dike, MD   10 mg at 06/27/21 1848   calcium gluconate 1 g/ 50 mL sodium chloride IVPB  1 g Intravenous Once Manuella Ghazi, Pratik D, DO        carbidopa-levodopa (SINEMET IR) 25-100 MG per tablet immediate release 1 tablet  1 tablet Oral TID Kathie Dike, MD   1 tablet at 06/27/21 2100   dextrose 50 % solution 50 mL  1 ampule Intravenous Once Manuella Ghazi, Pratik D, DO       heparin injection 5,000 Units  5,000 Units Subcutaneous Q8H Kathie Dike, MD   5,000 Units at 06/28/21 3419   insulin aspart (novoLOG) injection 10 Units  10 Units Intravenous Once Manuella Ghazi, Pratik D, DO       ondansetron (ZOFRAN) tablet 4 mg  4 mg Oral Q6H PRN Kathie Dike, MD       Or   ondansetron (ZOFRAN) injection 4 mg  4 mg Intravenous Q6H PRN Kathie Dike, MD       sodium zirconium cyclosilicate (LOKELMA) packet 10 g  10 g Oral Once Manuella Ghazi, Pratik D, DO         ALLERGIES Patient has no known allergies.  MEDICAL HISTORY Past Medical History:  Diagnosis Date   Acute CVA (cerebrovascular accident) ([place]) 06/06/2015   Acute ischemic stroke ([place]) 01/19/2015   Left midbrain/thalamus.   AKI (acute kidney injury) ([place]) 03/27/2020   Alcohol use    Anxiety    Cataract    Cerebral ventriculomegaly 01/20/2015   COPD (chronic obstructive pulmonary disease) (H[facility])    Depression    Depression with anxiety    Hyperlipidemia    Hypertension    Neuromuscular disorder (HCC)    Parkinson's disease (HCC)    Substance abuse ([place])    Tobacco abuse      SOCIAL HISTORY Social History   Socioeconomic History   Marital status: Divorced    Spouse name: Not on file   Number of children: 3   Years of education: 10   Highest education level: Not on file  Occupational History   Occupation: disabled/retired    Comment: Games developer  Tobacco Use   Smoking status: Every Day    Packs/day: 1.00    Types: Cigarettes   Smokeless tobacco: Never  Vaping Use   Vaping Use: Never used  Substance and Sexual Activity   Alcohol use: No    Comment: former   Drug use: No   Sexual activity: Never  Other Topics Concern   Not on file  Social History Narrative   Disabled from  [place] and stroke   Previous  Games developer   Lives alone   Has an aid   Family helps   Social Determinants of Health   Financial Resource Strain: Not on file  Food Insecurity: Not on file  Transportation Needs: Not on file  Physical Activity: Not on file  Stress: Not on file  Social Connections: Not on file  Intimate Partner Violence: Not on file     FAMILY HISTORY Family History  Problem Relation Age of Onset   Cancer Father        lung   Cancer Brother        lung   Early death Brother        suicide   Early death Brother        mva   COPD Sister    Diabetes Sister    Heart disease Sister    Early death  Sister        fire     Review of Systems: 12 systems reviewed Otherwise as per HPI, all other systems reviewed and negative  Physical Exam: Vitals:   06/27/21 2337 06/28/21 0628  BP: (!) 94/51 (!) 151/69  Pulse: 73 82  Resp: 16 19  Temp: 98.6 F (37 C) 98.7 F (37.1 C)  SpO2: 92% 92%   No intake/output data recorded.  Intake/Output Summary (Last 24 hours) at 06/28/2021 0755 Last data filed at 06/28/2021 0300 Gross per 24 hour  Intake 1700.06 ml  Output --  Net 1700.06 ml   General: chronically ill appearing, AMS CV: regular rate, normal rhythm, no murmurs, no gallops, no rubs, Lungs: clear to auscultation bilaterally, normal work of breathing Abd: soft, non-tender, non-distended Skin: no visible lesions or rashes Musculoskeletal: no edema Neuro: AAOX1 (to self), tremulous   Test Results Reviewed Lab Results  Component Value Date   NA 139 06/28/2021   K 5.3 (H) 06/28/2021   CL 102 06/28/2021   CO2 21 (L) 06/28/2021   BUN 84 (H) 06/28/2021   CREATININE 5.46 (H) 06/28/2021   CALCIUM 7.5 (L) 06/28/2021   ALBUMIN 3.3 (L) 06/28/2021   PHOS 2.4 (L) 03/28/2020     I have reviewed all relevant outside healthcare records related to the patient's kidney injury.

## 2021-06-28 NOTE — Plan of Care (Signed)
?  Problem: Elimination: ?Goal: Will not experience complications related to urinary retention ?Outcome: Progressing ?  ?

## 2021-06-28 NOTE — Progress Notes (Signed)
PROGRESS NOTE    Lucas [name]  XIP:[id number] DOB: Oct 20, 1945 DOA: 06/27/2021 PCP: Lucas Flavors, MD   Brief Narrative:   Lucas [name] is a 75 y.o. male with medical history significant of Parkinson's disease, prior CVA, COPD, tobacco use, hypertension, chronic lower extremity edema, hyperlipidemia, normally lives at home.  Family had brought him to ED due to what appears to be a chronic decline in his overall condition with confusion.  Patient was admitted for acute metabolic encephalopathy and associated acute kidney injury that appears to be due to rhabdomyolysis.  Palliative consulted with goals of care conversation pending.  Assessment & Plan:   Active Problems:   Parkinson's disease ([place])   Generalized weakness   Essential hypertension   Hyperlipidemia   COPD (chronic obstructive pulmonary disease) (HCC)   AKI (acute kidney injury) (HCC)   Acute metabolic encephalopathy   Bladder mass   Oliguric acute kidney injury secondary to rhabdomyolysis -Also with decreased p.o. intake in the setting of ongoing diuretic use and lisinopril. -Imaging of the abdomen did not reveal any hydronephrosis -Appreciate nephrology recommendations with plans to continue aggressive IV fluid -Patient is not a candidate for dialysis and palliative consultation requested  Hyperkalemia secondary to above -Improved with Lokelma and D50/insulin -Continue to monitor  Transaminitis 2/2 rhabdomyolysis -Continue to monitor   Acute metabolic encephalopathy -I suspect this is related to renal failure along with recent morphine use in the setting of AKI -ABG, TSH, and ammonia within normal limits -Continue to monitor for improvement pending further improvement in renal function -Consider brain MRI as needed -Holding sedating meds   Hypertension -Holding antihypertensives now since he was likely hypovolemic -Can consider resuming once volume issues have stabilized   Parkinson's  disease -Continue on Sinemet   Hyperlipidemia -Continue statin   Bladder mass -Appears to have been present on CT imaging from 2019 and appears unchanged -This can be followed outpatient by urology   COPD -No wheezing or shortness of breath at this time -Continue as needed bronchodilators   Generalized weakness -Physical therapy evaluation -May need skilled nursing facility placement if agreeable   Goals of care -Discussed with patient's son who reports that currently patient is full code -His son also did mention that patient is being followed by hospice, and may have an aide to help with his bathing -He did stress the patient is currently not on comfort care -We will need further information from participating hospice.   DVT prophylaxis: Heparin Code Status: Full Family Communication: Discussed with son on phone 11/2 Disposition Plan:  Status is: Inpatient  Remains inpatient appropriate because: Need for IV fluid.   Skin Assessment:  I have examined the patient's skin and I agree with the wound assessment as performed by the wound care RN as outlined below:  Pressure Injury 03/04/21 Sacrum Mid Stage 2 -  Partial thickness loss of dermis presenting as a shallow open injury with a red, pink wound bed without slough. (Active)  03/04/21 2245  Location: Sacrum  Location Orientation: Mid  Staging: Stage 2 -  Partial thickness loss of dermis presenting as a shallow open injury with a red, pink wound bed without slough.  Wound Description (Comments):   Present on Admission: Yes    Consultants:  Nephrology Palliative care  Procedures:  See below  Antimicrobials:  None   Subjective: Patient seen and evaluated today with no new acute complaints or concerns. No acute concerns or events noted overnight. He continues to remain confused and  has poor UO.  Objective: Vitals:   06/27/21 1546 06/27/21 1946 06/27/21 2337 06/28/21 0628  BP: (!) 106/94 140/88 (!) 94/51 (!)  151/69  Pulse: 75 89 73 82  Resp:  17 16 19   Temp: 98.2 F (36.8 C) 98.6 F (37 C) 98.6 F (37 C) 98.7 F (37.1 C)  TempSrc: Oral Oral Oral Oral  SpO2: 98% 93% 92% 92%  Weight: 81.2 kg     Height:        Intake/Output Summary (Last 24 hours) at 06/28/2021 1250 Last data filed at 06/28/2021 0300 Gross per 24 hour  Intake 1700.06 ml  Output --  Net 1700.06 ml   Filed Weights   06/27/21 1109 06/27/21 1546  Weight: 90.7 kg 81.2 kg    Examination:  General exam: Appears calm and comfortable, confused  Respiratory system: Clear to auscultation. Respiratory effort normal. Cardiovascular system: S1 & S2 heard, RRR.  Gastrointestinal system: Abdomen is soft Central nervous system: Alert and awake Extremities: No edema Skin: No significant lesions noted Psychiatry: Flat affect.    Data Reviewed: I have personally reviewed following labs and imaging studies  CBC: Recent Labs  Lab 06/27/21 1140 06/28/21 0447  WBC 13.4* 13.5*  HGB 15.6 13.4  HCT 46.6 40.6  MCV 95.7 97.1  PLT 248 993   Basic Metabolic Panel: Recent Labs  Lab 06/27/21 1140 06/28/21 0447 06/28/21 1136  NA 139 139 139  K 4.5 5.3* 4.4  CL 96* 102 101  CO2 25 21* 21*  GLUCOSE 87 111* 121*  BUN 67* 84* 91*  CREATININE 4.61* 5.46* 5.72*  CALCIUM 8.7* 7.5* 7.6*   GFR: Estimated Creatinine Clearance: 11.1 mL/min (A) (by C-G formula based on SCr of 5.72 mg/dL (H)). Liver Function Tests: Recent Labs  Lab 06/27/21 1140 06/28/21 0447  AST 207* 711*  ALT 31 20  ALKPHOS 82 61  BILITOT 2.4* 1.7*  PROT 8.1 6.6  ALBUMIN 4.4 3.3*   Recent Labs  Lab 06/27/21 1140  LIPASE 22   Recent Labs  Lab 06/27/21 1845  AMMONIA 26   Coagulation Profile: No results for input(s): INR, PROTIME in the last 168 hours. Cardiac Enzymes: Recent Labs  Lab 06/28/21 0447  CKTOTAL >50,000*   BNP (last 3 results) No results for input(s): PROBNP in the last 8760 hours. HbA1C: No results for input(s): HGBA1C in  the last 72 hours. CBG: No results for input(s): GLUCAP in the last 168 hours. Lipid Profile: No results for input(s): CHOL, HDL, LDLCALC, TRIG, CHOLHDL, LDLDIRECT in the last 72 hours. Thyroid Function Tests: Recent Labs    06/27/21 1845  TSH 3.349   Anemia Panel: No results for input(s): VITAMINB12, FOLATE, FERRITIN, TIBC, IRON, RETICCTPCT in the last 72 hours. Sepsis Labs: No results for input(s): PROCALCITON, LATICACIDVEN in the last 168 hours.  Recent Results (from the past 240 hour(s))  Resp Panel by RT-PCR (Flu A&B, Covid) Nasopharyngeal Swab     Status: None   Collection Time: 06/27/21 12:07 PM   Specimen: Nasopharyngeal Swab; Nasopharyngeal(NP) swabs in vial transport medium  Result Value Ref Range Status   SARS Coronavirus 2 by RT PCR NEGATIVE NEGATIVE Final    Comment: (NOTE) SARS-CoV-2 target nucleic acids are NOT DETECTED.  The SARS-CoV-2 RNA is generally detectable in upper respiratory specimens during the acute phase of infection. The lowest concentration of SARS-CoV-2 viral copies this assay can detect is 138 copies/mL. A negative result does not preclude SARS-Cov-2 infection and should not be used as the sole  basis for treatment or other patient management decisions. A negative result may occur with  improper specimen collection/handling, submission of specimen other than nasopharyngeal swab, presence of viral mutation(s) within the areas targeted by this assay, and inadequate number of viral copies(<138 copies/mL). A negative result must be combined with clinical observations, patient history, and epidemiological information. The expected result is Negative.  Fact Sheet for Patients:  EntrepreneurPulse.com.au  Fact Sheet for Healthcare Providers:  IncredibleEmployment.be  This test is no t yet approved or cleared by the [place] FDA and  has been authorized for detection and/or diagnosis of SARS-CoV-2 by FDA under  an Emergency Use Authorization (EUA). This EUA will remain  in effect (meaning this test can be used) for the duration of the COVID-19 declaration under Section 564(b)(1) of the Act, 21 U.S.C.section 360bbb-3(b)(1), unless the authorization is terminated  or revoked sooner.       Influenza A by PCR NEGATIVE NEGATIVE Final   Influenza B by PCR NEGATIVE NEGATIVE Final    Comment: (NOTE) The Xpert Xpress SARS-CoV-2/FLU/RSV plus assay is intended as an aid in the diagnosis of influenza from Nasopharyngeal swab specimens and should not be used as a sole basis for treatment. Nasal washings and aspirates are unacceptable for Xpert Xpress SARS-CoV-2/FLU/RSV testing.  Fact Sheet for Patients: EntrepreneurPulse.com.au  Fact Sheet for Healthcare Providers: IncredibleEmployment.be  This test is not yet approved or cleared by the [place] FDA and has been authorized for detection and/or diagnosis of SARS-CoV-2 by FDA under an Emergency Use Authorization (EUA). This EUA will remain in effect (meaning this test can be used) for the duration of the COVID-19 declaration under Section 564(b)(1) of the Act, 21 U.S.C. section 360bbb-3(b)(1), unless the authorization is terminated or revoked.  Performed at [facility], [address], [place], [place] [address]          Radiology Studies: CT ABDOMEN PELVIS WO CONTRAST  Result Date: 06/27/2021 CLINICAL DATA:  Abdominal pain. Strong urine smell. Elevated creatinine. EXAM: CT ABDOMEN AND PELVIS WITHOUT CONTRAST TECHNIQUE: Multidetector CT imaging of the abdomen and pelvis was performed following the standard protocol without IV contrast. COMPARISON:  11/03/2017. FINDINGS: Lower chest: No acute abnormality. Hepatobiliary: No focal liver abnormality is seen. No gallstones, gallbladder wall thickening, or biliary dilatation. Pancreas: Unremarkable. No pancreatic ductal dilatation or surrounding inflammatory  changes. Spleen: Normal in size without focal abnormality. Adrenals/Urinary Tract: No adrenal masses. No right kidney. Mild left renal cortical thinning. No renal stone or hydronephrosis. Normal left ureter. Dilated tubular structure extends from just below the right adrenal gland to the right posterior bladder at the level of the ureteral trigone. This dilated ureter is contiguous with a bladder filling defect, also of the same attenuation. This appearance is concerning for a bladder malignancy, but is unchanged from the 11/03/2017 exam strongly favoring a benign etiology. Bladder is minimally distended, otherwise unremarkable. Stomach/Bowel: Normal stomach. Small bowel and colon are normal in caliber. No wall thickening. Subtle hazy opacity lies adjacent to the proximal to mid sigmoid colon where there are also multiple diverticula. This is not evident on the prior CT. No other evidence of pericolonic inflammation. Normal appendix is visualized. Vascular/Lymphatic: Aortic atherosclerosis. No aneurysm. No enlarged lymph nodes. Reproductive: Prostate is normal in size. Other: Small fat containing left inguinal hernia.  No ascites. Musculoskeletal: No fracture or acute finding.  No bone lesion. IMPRESSION: 1. Subtle hazy opacity lies adjacent to the proximal to mid sigmoid colon, where there are multiple diverticula. This is  an equivocal finding, but mild uncomplicated diverticulitis should be consider if this correlates clinically. No other evidence of an acute abnormality within the abdomen or pelvis. 2. Dilated tubular structure that is contiguous with a mass projecting within the bladder. There is no visualized right kidney, presumably congenitally absent. The dilated tubular structure is likely the right gonadal vein. However, the course parallels the expected course of a right ureter. Both the bladder mass and the dilated tubular structure are stable compared to the 11/03/2017 CT. 3. Aortic atherosclerosis.  Electronically Signed   By: Lajean Manes M.D.   On: 06/27/2021 13:49   CT Head Wo Contrast  Result Date: 06/27/2021 CLINICAL DATA:  Altered mental status. EXAM: CT HEAD WITHOUT CONTRAST TECHNIQUE: Contiguous axial images were obtained from the base of the skull through the vertex without intravenous contrast. COMPARISON:  December 21, 2019. FINDINGS: Brain: Mild chronic ischemic white matter disease is noted. Stable mild ventricular prominence is noted which may be due to mild cortical atrophy. No mass effect or midline shift is noted. There is no evidence of mass lesion, hemorrhage or acute infarction. Vascular: No hyperdense vessel or unexpected calcification. Skull: Normal. Negative for fracture or focal lesion. Sinuses/Orbits: No acute finding. Other: None. IMPRESSION: No acute intracranial abnormality seen. Stable mild ventricular dilatation as described above. Electronically Signed   By: Marijo Conception M.D.   On: 06/27/2021 13:26   DG Chest Port 1 View  Result Date: 06/27/2021 CLINICAL DATA:  Altered mental status in a 75 year old male. EXAM: PORTABLE CHEST 1 VIEW COMPARISON:  June 09, 2021. FINDINGS: EKG leads project over the chest. Trachea midline. Cardiomediastinal contours and hilar structures stable. Linear opacity present over the LEFT hemidiaphragm. No visible pneumothorax. No sign of pleural effusion. On limited assessment no acute skeletal process. IMPRESSION: Linear opacity over the LEFT hemidiaphragm may represent atelectasis or scarring. No acute cardiopulmonary process. Electronically Signed   By: Zetta Bills M.D.   On: 06/27/2021 12:10        Scheduled Meds:  aspirin EC  81 mg Oral Daily   atorvastatin  10 mg Oral QPM   carbidopa-levodopa  1 tablet Oral TID   heparin  5,000 Units Subcutaneous Q8H   Continuous Infusions:  sodium chloride 125 mL/hr at 06/28/21 0915   sodium chloride       LOS: 0 days    Time spent: 35 minutes    Kimaya Whitlatch Darleen Crocker, DO [facility]  If 7PM-7AM, please contact night-coverage [email or web address] 06/28/2021, 12:50 PM

## 2021-06-28 NOTE — Consult Note (Signed)
Consultation Note Date: 06/28/2021   Patient Name: Lucas [name]  DOB: [birth year]  MRN: [id number]  Age / Sex: 75 y.o., male  PCP: Alfonse Flavors, MD Referring Physician: Rodena Goldmann, DO  Reason for Consultation: Establishing goals of care  HPI/Patient Profile: 75 y.o. male  with past medical history of Parkinson's disease, prior CVA May and October of 2016, COPD with PPD tobacco use, HTN/HLD, chronic LE edema, anxiety/depression, normally lives at home with family, history of bladder mass unchanged since 2019, follow-up outpatient admitted on 06/27/2021 with acute kidney injury that appears to be due to rhabdomyolysis.   Clinical Assessment and Goals of Care: I have reviewed medical records including EPIC notes, labs and imaging, received report from RN, assessed the patient.  Lucas [name], Lucas [name], is sitting up in bed.  He greets me, making and somewhat keeping eye contact.  He appears acutely/chronically ill and frail.  He is alert and oriented x2, I believe that he can make his basic needs known.  There is no family at bedside at this time.  Although Lucas [name] is alert and oriented, he does have periods of confusion, he is unable to answer some questions.  We talked about his kidney failure.  I ask if he would accept hemodialysis if offered.  He tells me that he would not want to take dialysis.  I asked, "even if you die without it?".  Lucas [name] shares that indeed he would rather pass away than take dialysis.  Call to son, Lucas [name], to discuss diagnosis prognosis, [place], EOL wishes, disposition and options.  I introduced Palliative Medicine as specialized medical care for people living with serious illness. It focuses on providing relief from the symptoms and stress of a serious illness. The goal is to improve quality of life for both the patient and the family.  We discussed a brief life review of the  patient.  Lucas [name] states that Lucas [name] lives alone in his own home.  Lucas [name]'s aunt comes most every morning to help with Lucas [name]'s care.  Sons Lucas [name] and Lucas [name] both usually come daily also.  Lucas [name] shares that Lucas [name] is incontinent and uses depends.  He tells me that at this point, Lucas [name] can only transfer, has limited mobility.  Lucas [name] shares that he feels like his father should have been moved into a nursing home or at least ALF 3 years ago but Keir continues to refuse this.  We then focused on their current illness.  We talked in detail about acute kidney failure and Lucas [name]'s choice to not take hemodialysis.  We talked about Parkinson's disease and dementia, also history of strokes May and October 2016.  The natural disease trajectory, progressive chronic illness burden, and expectations at EOL were discussed.  We talked about time for outcomes, rechecking kidney function tomorrow.  Lucas [name] shares that if Lucas [name] is able to have some recovery, they would want him to have short-term rehab (if qualified) in [place] at [facility] or [facility].  Advanced directives, concepts specific to code status, were considered  and discussed.  Lucas [name] states that family would not want intubation for Lucas [name] but at this point would want attempted resuscitation with chest compressions/defibrillation if needed.  We talk in detail about the realities of CPR.  We talked about the concept of "treat the treatable, but allowing natural death".  I share that Lucas [name] chronic illness burden is heavy and his disease burden is progressive, does not improve over time.  We briefly discussed outpatient palliative services.  PMT to follow-up tomorrow.    Discussed the importance of continued conversation with family and the medical providers regarding overall plan of care and treatment options, ensuring decisions are within the context of the patient's values and GOCs.  Questions and concerns were addressed.  The family was encouraged to call with questions  or concerns.  PMT will continue to support holistically.  Conference with attending, bedside nursing staff, transition of care team related to patient condition, needs, goals of care, disposition.   HCPOA  NEXT OF KIN -Lucas [name]'s son Lucas [name] is primary, son Lucas [name] is secondary.    SUMMARY OF RECOMMENDATIONS   At this point continue to treat the treatable but no intubation Time for outcomes Anticipate need for short-term rehab, possible long-term care  Code Status/Advance Care Planning: Limited code -no intubation/BiPAP, at this point agreeable to chest compressions/defib/ACLS meds  Symptom Management:  Per hospitalist, no additional needs at this time.  Palliative Prophylaxis:  Frequent Pain Assessment and Oral Care  Additional Recommendations (Limitations, Scope, Preferences): Treat the treatable but no intubation/BiPAP  Psycho-social/Spiritual:  Desire for further Chaplaincy support:no Additional Recommendations: Caregiving  Support/Resources and Education on Hospice  Prognosis:  Unable to determine, based on outcomes.  Guarded at this point.  Discharge Planning:  To be determined, based on outcomes and qualifiers.       Primary Diagnoses: Present on Admission:  AKI (acute kidney injury) ([place])  Parkinson's disease ([place])  Essential hypertension  Acute metabolic encephalopathy  COPD (chronic obstructive pulmonary disease) (HCC)  Hyperlipidemia  Bladder mass   I have reviewed the medical record, interviewed the patient and family, and examined the patient. The following aspects are pertinent.  Past Medical History:  Diagnosis Date   Acute CVA (cerebrovascular accident) ([place]) 06/06/2015   Acute ischemic stroke ([place]) 01/19/2015   Left midbrain/thalamus.   AKI (acute kidney injury) ([place]) 03/27/2020   Alcohol use    Anxiety    Cataract    Cerebral ventriculomegaly 01/20/2015   COPD (chronic obstructive pulmonary disease) (HCC)    Depression    Depression with  anxiety    Hyperlipidemia    Hypertension    Neuromuscular disorder (HCC)    Parkinson's disease (HCC)    Substance abuse ([place])    Tobacco abuse    Social History   Socioeconomic History   Marital status: Divorced    Spouse name: Not on file   Number of children: 3   Years of education: 10   Highest education level: Not on file  Occupational History   Occupation: disabled/retired    Comment: Games developer  Tobacco Use   Smoking status: Every Day    Packs/day: 1.00    Types: Cigarettes   Smokeless tobacco: Never  Vaping Use   Vaping Use: Never used  Substance and Sexual Activity   Alcohol use: No    Comment: former   Drug use: No   Sexual activity: Never  Other Topics Concern   Not on file  Social History Narrative   Disabled from [employer]  and stroke   Previous  [employer] alone   Has an aid   Family helps   Social Determinants of Health   Financial Resource Strain: Not on file  Food Insecurity: Not on file  Transportation Needs: Not on file  Physical Activity: Not on file  Stress: Not on file  Social Connections: Not on file   Family History  Problem Relation Age of Onset   Cancer Father        lung   Cancer Brother        lung   Early death Brother        suicide   Early death Brother        mva   COPD Sister    Diabetes Sister    Heart disease Sister    Early death Sister        fire   Scheduled Meds:  aspirin EC  81 mg Oral Daily   carbidopa-levodopa  1 tablet Oral TID   heparin  5,000 Units Subcutaneous Q8H   Continuous Infusions:  sodium chloride 150 mL/hr at 06/28/21 1252   PRN Meds:.acetaminophen **OR** acetaminophen, albuterol, ondansetron **OR** ondansetron (ZOFRAN) IV Medications Prior to Admission:  Prior to Admission medications   Medication Sig Start Date End Date Taking? Authorizing Provider  albuterol (PROVENTIL HFA;VENTOLIN HFA) 108 (90 Base) MCG/ACT inhaler Inhale 1 puff into the lungs every 4 (four) hours as needed  for wheezing or shortness of breath.   Yes [provider]  aspirin EC 81 MG EC tablet Take 1 tablet (81 mg total) by mouth daily. 01/16/15  Yes Isaac Bliss, [name], MD  atorvastatin (LIPITOR) 10 MG tablet Take 10 mg by mouth every evening.  04/27/15  Yes [provider]  busPIRone (BUSPAR) 10 MG tablet Take 10 mg by mouth 2 (two) times daily. 06/27/21  Yes [provider]  carbidopa-levodopa (SINEMET IR) 25-100 MG tablet Take 1 tablet by mouth 3 (three) times daily. 11/15/19  Yes [provider]  cholecalciferol (VITAMIN D3) 25 MCG (1000 UNIT) tablet Take 1,000 Units by mouth daily.   Yes [provider]  famotidine (PEPCID) 20 MG tablet Take 20 mg by mouth 2 (two) times daily. 06/27/21  Yes [provider]  furosemide (LASIX) 80 MG tablet Take 80 mg by mouth 2 (two) times daily. 06/27/21  Yes [provider]  gabapentin (NEURONTIN) 300 MG capsule Take 600 mg by mouth 2 (two) times daily. 06/27/21  Yes [provider]  hydrochlorothiazide (HYDRODIURIL) 25 MG tablet Take 25 mg by mouth daily. 11/09/19  Yes [provider]  ipratropium-albuterol (DUONEB) 0.5-2.5 (3) MG/3ML SOLN SMARTSIG:3 Milliliter(s) Via Nebulizer Every 6 Hours 12/14/19  Yes [provider]  lisinopril (ZESTRIL) 20 MG tablet Take 20 mg by mouth daily. 11/15/19  Yes [provider]  morphine (MS CONTIN) 15 MG 12 hr tablet Take 15 mg by mouth 2 (two) times daily as needed. 06/27/21  Yes [provider]  PARoxetine (PAXIL) 40 MG tablet Take 60 mg by mouth every morning.    Yes [provider]  rOPINIRole (REQUIP) 3 MG tablet Take 3 mg by mouth in the morning, at noon, and at bedtime.    Yes [provider]  vitamin B-12 (CYANOCOBALAMIN) 1000 MCG tablet Take 1,000 mcg by mouth daily.   Yes [provider]  clonazePAM (KLONOPIN) 1 MG tablet Take 1 mg by mouth daily as needed. 12/11/19   [provider]  furosemide (  LASIX) 20 MG tablet Take 2 tablets (40 mg total) by mouth daily for 14 days. 03/04/21 03/18/21  Noemi Chapel, MD  [facility]) CREA Apply 1 application topically daily.  11/25/19   [provider]   No Known Allergies Review of Systems  Unable to perform ROS: Dementia   Physical Exam Vitals and nursing note reviewed.  Constitutional:      General: He is not in acute distress.    Appearance: He is ill-appearing.  HENT:     Mouth/Throat:     Mouth: Mucous membranes are moist.  Cardiovascular:     Rate and Rhythm: Normal rate.  Pulmonary:     Effort: Pulmonary effort is normal. No respiratory distress.  Skin:    General: Skin is warm and dry.  Neurological:     Mental Status: He is alert.     Comments: Oriented to person and place, not month  Psychiatric:     Comments: Calm and cooperative    Vital Signs: BP (!) 120/102 (BP Location: Left Arm)   Pulse 83   Temp 98.7 F (37.1 C) (Oral)   Resp 20   Ht 5\' 8"  (1.727 m)   Wt 81.2 kg   SpO2 94%   BMI 27.22 kg/m  Pain Scale: 0-10   Pain Score: 0-No pain   SpO2: SpO2: 94 % O2 Device:SpO2: 94 % O2 Flow Rate: .O2 Flow Rate (L/min): 1 L/min  IO: Intake/output summary:  Intake/Output Summary (Last 24 hours) at 06/28/2021 1501 Last data filed at 06/28/2021 0300 Gross per 24 hour  Intake 1700.06 ml  Output --  Net 1700.06 ml    LBM: Last BM Date:  (unable to assess) Baseline Weight: Weight: 90.7 kg Most recent weight: Weight: 81.2 kg     Palliative Assessment/Data:   Flowsheet Rows    Flowsheet Row Most Recent Value  Intake Tab   Referral Department Hospitalist  Unit at Time of Referral Cardiac/Telemetry Unit  Palliative Care Primary Diagnosis Nephrology  Date Notified 06/28/21  Palliative Care Type New Palliative care  Reason for referral Clarify Goals of Care  Date of Admission 06/27/21  Date first seen by Palliative Care 06/28/21  # of days Palliative referral  response time 0 Day(s)  # of days IP prior to Palliative referral 1  Clinical Assessment   Palliative Performance Scale Score 40%  Pain Max last 24 hours Not able to report  Pain Min Last 24 hours Not able to report  Dyspnea Max Last 24 Hours Not able to report  Dyspnea Min Last 24 hours Not able to report  Psychosocial & Spiritual Assessment   Palliative Care Outcomes        Time In: 1330 Time Out: 1440 Time Total: 70 minutes  Greater than 50%  of this time was spent counseling and coordinating care related to the above assessment and plan.  Signed by: Drue Novel, NP   Please contact Palliative Medicine Team phone at [phone] for questions and concerns.  For individual provider: See Shea Evans

## 2021-06-29 DIAGNOSIS — G9341 Metabolic encephalopathy: Secondary | ICD-10-CM | POA: Diagnosis not present

## 2021-06-29 DIAGNOSIS — Z7189 Other specified counseling: Secondary | ICD-10-CM | POA: Diagnosis not present

## 2021-06-29 DIAGNOSIS — G20 Parkinson's disease: Secondary | ICD-10-CM | POA: Diagnosis not present

## 2021-06-29 DIAGNOSIS — N179 Acute kidney failure, unspecified: Secondary | ICD-10-CM | POA: Diagnosis not present

## 2021-06-29 DIAGNOSIS — Z515 Encounter for palliative care: Secondary | ICD-10-CM | POA: Diagnosis not present

## 2021-06-29 LAB — COMPREHENSIVE METABOLIC PANEL
ALT: 17 U/L (ref 0–44)
AST: 648 U/L — ABNORMAL HIGH (ref 15–41)
Albumin: 2.4 g/dL — ABNORMAL LOW (ref 3.5–5.0)
Alkaline Phosphatase: 55 U/L (ref 38–126)
Anion gap: 16 — ABNORMAL HIGH (ref 5–15)
BUN: 99 mg/dL — ABNORMAL HIGH (ref 8–23)
CO2: 18 mmol/L — ABNORMAL LOW (ref 22–32)
Calcium: 6.9 mg/dL — ABNORMAL LOW (ref 8.9–10.3)
Chloride: 107 mmol/L (ref 98–111)
Creatinine, Ser: 6.32 mg/dL — ABNORMAL HIGH (ref 0.61–1.24)
GFR, Estimated: 9 mL/min — ABNORMAL LOW (ref >60)
Glucose, Bld: 86 mg/dL (ref 70–99)
Potassium: 4.6 mmol/L (ref 3.5–5.1)
Sodium: 141 mmol/L (ref 135–145)
Total Bilirubin: 1 mg/dL (ref 0.3–1.2)
Total Protein: 5.5 g/dL — ABNORMAL LOW (ref 6.5–8.1)

## 2021-06-29 LAB — CBC
HCT: 34.7 % — ABNORMAL LOW (ref 39.0–52.0)
Hemoglobin: 11.2 g/dL — ABNORMAL LOW (ref 13.0–17.0)
MCH: 31.9 pg (ref 26.0–34.0)
MCHC: 32.3 g/dL (ref 30.0–36.0)
MCV: 98.9 fL (ref 80.0–100.0)
Platelets: 156 10*3/uL (ref 150–400)
RBC: 3.51 MIL/uL — ABNORMAL LOW (ref 4.22–5.81)
RDW: 13.2 % (ref 11.5–15.5)
WBC: 13.9 10*3/uL — ABNORMAL HIGH (ref 4.0–10.5)
nRBC: 0 % (ref 0.0–0.2)

## 2021-06-29 LAB — MAGNESIUM: Magnesium: 2.4 mg/dL (ref 1.7–2.4)

## 2021-06-29 LAB — CK: Total CK: 44762 U/L — ABNORMAL HIGH (ref 49–397)

## 2021-06-29 MED ORDER — LACTATED RINGERS IV SOLN: INTRAVENOUS | Status: DC

## 2021-06-29 MED ORDER — LACTATED RINGERS IV BOLUS
1000.0000 mL | Freq: Once | INTRAVENOUS | Status: AC
  Administered 2021-06-29: 1000 mL via INTRAVENOUS

## 2021-06-29 MED ORDER — SODIUM BICARBONATE 650 MG PO TABS
1300.0000 mg | ORAL_TABLET | Freq: Three times a day (TID) | ORAL | Status: DC
Start: 2021-06-29 — End: 2021-07-08
  Administered 2021-06-29 – 2021-07-08 (×27): 1300 mg via ORAL
  Filled 2021-06-29 (×28): qty 2

## 2021-06-29 NOTE — Progress Notes (Signed)
[name] Progress Note    Assessment/ Plan:   AKI: secondary to rhabdo and hemodynamic insults from prolonged prerenal injury in the context of solitary kidney & lasix + HCTZ + lisinopril use. -worsening kidney function. Overall, he is not an ideal HD candidate given underlying comorbidites (parkinson's, decline in functional status) and risks may outweigh benefits. I have discussed this with Lucas [name] (son) on 11/2. Appreciate palliative consult, addressing goals of care -continue with isotonic fluids for now, bolus 1L followed by 125cc/hr (fluids changed to LR) -lasix, HCTZ, and lisinopril on hold -Continue to monitor daily Cr, Dose meds for GFR<15 -Monitor Daily strict I/Os, Daily weight  -Maintain MAP>65 for optimal renal perfusion.  -avoid further nephrotoxins including NSAIDS, Morphine.  Unless absolutely necessary, avoid CT with contrast and/or MRI with gadolinium.     Rhabdomyolysis -unknown down time, monitor CK, 1L LR bolus today followed by 125cc/hr. Monitor volume status closely -CK >50k 11/1. Slightly better today   AMS -per primary. Possibly secondary to morphine in AKI + uremia   Leukocytosis -possible early diverticulitis? Received flagyl and cipro x 1 dose. Per primary   Parkinson's -per primary, appreciate palliative care's assistance   Hypertension: -holding diuretics and ACEI. Monitor for now   Hyperkalemia -resolved, monitor for now, low K diet   AGMA -starting nahco3 1300mg  tid, changing fluids to LR   Bladder mass -unchanged in appearance since 2019, can be followed as an outpatient. Of note, he still smoking   Recommendations conveyed to primary service.    [facility]  Subjective:   No acute events. Mentation slightly better (aaox2 today, knows where he is). Rec'd 1L BS bolus yesterday followed by 150cc/hr. Sat'in 98% on RA. Seen by palliative yesterday, now DNI.   Objective:   BP (!) 139/42 (BP  Location: Right Arm)   Pulse 71   Temp 99.6 F (37.6 C) (Oral)   Resp 19   Ht 5\' 8"  (1.727 m)   Wt 81.2 kg   SpO2 98%   BMI 27.22 kg/m   Intake/Output Summary (Last 24 hours) at 06/29/2021 0840 Last data filed at 06/28/2021 1800 Gross per 24 hour  Intake 1692.93 ml  Output --  Net 1692.93 ml   Weight change:   Physical Exam: FAO:ZHYQMVHQION ill appearing, NAD CVS:s1s2 Resp:cta bl GEX:BMWU Ext:no edema Neuro: aaox2 (self, location), +resting tremors GU: external foley  Imaging: CT ABDOMEN PELVIS WO CONTRAST  Result Date: 06/27/2021 CLINICAL DATA:  Abdominal pain. Strong urine smell. Elevated creatinine. EXAM: CT ABDOMEN AND PELVIS WITHOUT CONTRAST TECHNIQUE: Multidetector CT imaging of the abdomen and pelvis was performed following the standard protocol without IV contrast. COMPARISON:  11/03/2017. FINDINGS: Lower chest: No acute abnormality. Hepatobiliary: No focal liver abnormality is seen. No gallstones, gallbladder wall thickening, or biliary dilatation. Pancreas: Unremarkable. No pancreatic ductal dilatation or surrounding inflammatory changes. Spleen: Normal in size without focal abnormality. Adrenals/Urinary Tract: No adrenal masses. No right kidney. Mild left renal cortical thinning. No renal stone or hydronephrosis. Normal left ureter. Dilated tubular structure extends from just below the right adrenal gland to the right posterior bladder at the level of the ureteral trigone. This dilated ureter is contiguous with a bladder filling defect, also of the same attenuation. This appearance is concerning for a bladder malignancy, but is unchanged from the 11/03/2017 exam strongly favoring a benign etiology. Bladder is minimally distended, otherwise unremarkable. Stomach/Bowel: Normal stomach. Small bowel and colon are normal in caliber. No wall thickening. Subtle hazy opacity lies  adjacent to the proximal to mid sigmoid colon where there are also multiple diverticula. This is not  evident on the prior CT. No other evidence of pericolonic inflammation. Normal appendix is visualized. Vascular/Lymphatic: Aortic atherosclerosis. No aneurysm. No enlarged lymph nodes. Reproductive: Prostate is normal in size. Other: Small fat containing left inguinal hernia.  No ascites. Musculoskeletal: No fracture or acute finding.  No bone lesion. IMPRESSION: 1. Subtle hazy opacity lies adjacent to the proximal to mid sigmoid colon, where there are multiple diverticula. This is an equivocal finding, but mild uncomplicated diverticulitis should be consider if this correlates clinically. No other evidence of an acute abnormality within the abdomen or pelvis. 2. Dilated tubular structure that is contiguous with a mass projecting within the bladder. There is no visualized right kidney, presumably congenitally absent. The dilated tubular structure is likely the right gonadal vein. However, the course parallels the expected course of a right ureter. Both the bladder mass and the dilated tubular structure are stable compared to the 11/03/2017 CT. 3. Aortic atherosclerosis. Electronically Signed   By: Lajean Manes M.D.   On: 06/27/2021 13:49   CT Head Wo Contrast  Result Date: 06/27/2021 CLINICAL DATA:  Altered mental status. EXAM: CT HEAD WITHOUT CONTRAST TECHNIQUE: Contiguous axial images were obtained from the base of the skull through the vertex without intravenous contrast. COMPARISON:  December 21, 2019. FINDINGS: Brain: Mild chronic ischemic white matter disease is noted. Stable mild ventricular prominence is noted which may be due to mild cortical atrophy. No mass effect or midline shift is noted. There is no evidence of mass lesion, hemorrhage or acute infarction. Vascular: No hyperdense vessel or unexpected calcification. Skull: Normal. Negative for fracture or focal lesion. Sinuses/Orbits: No acute finding. Other: None. IMPRESSION: No acute intracranial abnormality seen. Stable mild ventricular dilatation  as described above. Electronically Signed   By: Marijo Conception M.D.   On: 06/27/2021 13:26   DG Chest Port 1 View  Result Date: 06/27/2021 CLINICAL DATA:  Altered mental status in a 75 year old male. EXAM: PORTABLE CHEST 1 VIEW COMPARISON:  June 09, 2021. FINDINGS: EKG leads project over the chest. Trachea midline. Cardiomediastinal contours and hilar structures stable. Linear opacity present over the LEFT hemidiaphragm. No visible pneumothorax. No sign of pleural effusion. On limited assessment no acute skeletal process. IMPRESSION: Linear opacity over the LEFT hemidiaphragm may represent atelectasis or scarring. No acute cardiopulmonary process. Electronically Signed   By: Zetta Bills M.D.   On: 06/27/2021 12:10    Labs: BMET Recent Labs  Lab 06/27/21 1140 06/28/21 0447 06/28/21 1136 06/29/21 0451  NA 139 139 139 141  K 4.5 5.3* 4.4 4.6  CL 96* 102 101 107  CO2 25 21* 21* 18*  GLUCOSE 87 111* 121* 86  BUN 67* 84* 91* 99*  CREATININE 4.61* 5.46* 5.72* 6.32*  CALCIUM 8.7* 7.5* 7.6* 6.9*   CBC Recent Labs  Lab 06/27/21 1140 06/28/21 0447 06/29/21 0451  WBC 13.4* 13.5* 13.9*  HGB 15.6 13.4 11.2*  HCT 46.6 40.6 34.7*  MCV 95.7 97.1 98.9  PLT 248 202 156    Medications:     aspirin EC  81 mg Oral Daily   carbidopa-levodopa  1 tablet Oral TID   heparin  5,000 Units Subcutaneous Q8H   nystatin   Topical BID   sodium bicarbonate  1,300 mg Oral TID      Gean Quint, MD [facility] 06/29/2021, 8:40 AM

## 2021-06-29 NOTE — Plan of Care (Signed)
  Problem: Education: Goal: Knowledge of General Education information will improve Description: Including pain rating scale, medication(s)/side effects and non-pharmacologic comfort measures Outcome: Not Progressing   Problem: Health Behavior/Discharge Planning: Goal: Ability to manage health-related needs will improve Outcome: Not Progressing   Problem: Clinical Measurements: Goal: Ability to maintain clinical measurements within normal limits will improve Outcome: Not Progressing Goal: Will remain free from infection Outcome: Progressing Goal: Diagnostic test results will improve Outcome: Not Progressing Goal: Respiratory complications will improve Outcome: Progressing Goal: Cardiovascular complication will be avoided Outcome: Progressing   Problem: Activity: Goal: Risk for activity intolerance will decrease Outcome: Progressing   Problem: Nutrition: Goal: Adequate nutrition will be maintained Outcome: Progressing   Problem: Coping: Goal: Level of anxiety will decrease Outcome: Progressing   Problem: Elimination: Goal: Will not experience complications related to bowel motility Outcome: Progressing Goal: Will not experience complications related to urinary retention Outcome: Progressing   Problem: Pain Managment: Goal: General experience of comfort will improve Outcome: Progressing   Problem: Safety: Goal: Ability to remain free from injury will improve Outcome: Progressing   Problem: Skin Integrity: Goal: Risk for impaired skin integrity will decrease Outcome: Not Progressing

## 2021-06-29 NOTE — TOC Initial Note (Signed)
Transition of Care [facility]) - Initial/Assessment Note    Patient Details  Name: Lucas [name] MRN: [id number] Date of Birth: 12-30-1945  Transition of Care [facility]) CM/SW Contact:    Lucas Flood, LCSW Phone Number: 06/29/2021, 10:37 AM  Clinical Narrative:                  Pt admitted from home with AKI. Pt has a history of Parkinson's. He lives alone and has family assistance periodically throughout the day. Pt has been non ambulatory at home but at baseline was able to transfer to w/c. Per MD, if pt does not recover renal function, pt is saying he would not want to have HD. Palliative is consulting.   Spoke with Lucas [name], Lucas [name], [phone]) who states that [name] and family have been trying to convince pt to go to an ALF or long term care NH for some time now. Pt is competent per Lucas [name] and he has not been agreeable to placement. [name] has an aide in the home twice a week for one hour each time strictly for meal prep and light housekeeping. Per Lucas [name], pt is active with [facility] at home and they are providing minimal aide assistance for personal care.   Spoke with staff at [facility] and pt is active with them at home. Lucas [name] at [facility] says pt will remain on hospice services while in the hospital.  Family informed Palliative APNP that if pt needs rehab/placement at [facility], they prefer Lucas [name] or Lucas [name] in [place].   TOC will follow and continue to assess for dc planning needs.  Expected Discharge Plan: Skilled Nursing Facility Barriers to Discharge: Continued Medical Work up   Patient Goals and CMS Choice        Expected Discharge Plan and Services Expected Discharge Plan: [facility] In-house Referral: Clinical Social Work     Living arrangements for the past 2 months: Single Family Home                                      Prior Living Arrangements/Services Living arrangements for the past 2 months: Single Family Home Lives  with:: Self Patient language and need for interpreter reviewed:: Yes        Need for Family Participation in Patient Care: Yes (Comment) Care giver support system in place?: Yes (comment) Current home services: DME, Hospice Criminal Activity/Legal Involvement Pertinent to Current Situation/Hospitalization: No - Comment as needed  Activities of Daily Living Home Assistive Devices/Equipment: None ADL Screening (condition at time of admission) Patient's cognitive ability adequate to safely complete daily activities?: No Is the patient deaf or have difficulty hearing?: No Does the patient have difficulty seeing, even when wearing glasses/contacts?: No Does the patient have difficulty concentrating, remembering, or making decisions?: Yes Patient able to express need for assistance with ADLs?: No Does the patient have difficulty dressing or bathing?: Yes Independently performs ADLs?: No Communication: Independent Dressing (OT): Needs assistance Is this a change from baseline?: Change from baseline, expected to last <3days Grooming: Needs assistance Is this a change from baseline?: Change from baseline, expected to last >3 days Feeding: Needs assistance Is this a change from baseline?: Change from baseline, expected to last >3 days Bathing: Needs assistance Is this a change from baseline?: Change from baseline, expected to last >3 days Toileting: Needs assistance Is this a change from baseline?: Change from baseline, expected to  last <3 days In/Out Bed: Needs assistance Is this a change from baseline?: Change from baseline, expected to last <3 days Does the patient have difficulty walking or climbing stairs?: Yes Weakness of Legs: Both Weakness of Arms/Hands: Both  Permission Sought/Granted                  Emotional Assessment       Orientation: : Oriented to Self, Oriented to Place, Oriented to  Time, Oriented to Situation Alcohol / Substance Use: Not Applicable Psych  Involvement: No (comment)  Admission diagnosis:  Diverticulitis [K57.92] AKI (acute kidney injury) ([place]) [N17.9] Altered mental status, unspecified altered mental status type [R41.82] Patient Active Problem List   Diagnosis Date Noted   Acute metabolic encephalopathy 03/00/9233   Bladder mass 06/27/2021   Hypotension 03/27/2020   Left leg pain 03/27/2020   AKI (acute kidney injury) ([place]) 03/27/2020   Cellulitis 03/27/2020   Restless leg syndrome 03/27/2020   Stroke ([place]) 01/25/2017   Essential hypertension 06/06/2015   Hyperlipidemia 06/06/2015   COPD (chronic obstructive pulmonary disease) ([place]) 06/06/2015   Multiple falls 06/05/2015   Cerebral ventriculomegaly 01/20/2015   Generalized weakness 01/19/2015   Facial droop 01/19/2015   Tobacco abuse 01/19/2015   Obesity 01/19/2015   Depression with anxiety 01/19/2015   Hyperglycemia 01/19/2015   Dysarthria 01/15/2015   Parkinson's disease ([place])    PCP:  Alfonse Flavors, MD Pharmacy:   [id number] [address], [place] [place] [address] Phone: [phone] Fax: [phone]     Social Determinants of Health (SDOH) Interventions    Readmission Risk Interventions Readmission Risk Prevention Plan 06/29/2021 03/28/2020  Transportation Screening Complete Complete  Home Care Screening Complete Complete  Medication Review (RN CM) - Complete  Some recent data might be hidden

## 2021-06-29 NOTE — Progress Notes (Signed)
Palliative: Mr. Sumeet, Geter, is lying quietly in bed.  He is resting comfortably, but wakes when I touch his arm and call his name.  He is oriented to person and situation, but has periods of confusion.  He is clearly not decisional at this point and I consider that he is experiencing the early stages of Parkinson's dementia.  I believe that he is able to make his basic needs known.  There is no family at bedside at this time.  Silus's creatinine continues to increase and is now at 6.3.  Nursing staff shares that patient's sister and son are at bedside.  Face-to-face meeting with son Audry Pili and Florentina Jenny.  Vaughan Basta has been a caregiver for her brother for quite some time now she shares that Lovis has mentioned in the past that he did not want to continue to live dependent on other people.  We talked about the realities of Johnnathan's increasing creatinine, his overall functional status, Parkinson's with what seems to be a early stages of dementia, bladder mass, single kidney, respiratory status as a current smoker and risk for pneumonia.    Audry Pili shares that they have never discussed end-of-life issues or choices.  He states that these are hard decisions to make.  We talked about the next few days has been indicators for Gilmore's ability to recover, again focusing on the big picture of his declining health.    We talked in detail about CODE STATUS, "treat the treatable but allowing natural passing".  Audry Pili states that he needs to speak with his brother Ronalee Belts about these choices.  I share that family will likely be called to make some hard choices over the next few days.  Conference with attending, bedside nursing staff, transition of care team related to patient condition, needs, goals of care, disposition.  Plan:   At this point continue to treat the treatable.  Time for outcomes.  No intubation, considering CODE STATUS.      58 minutes  [name], NP Palliative medicine team Team phone  [phone] Greater than 50% of this time was spent counseling and coordinating care related to the above assessment and plan.

## 2021-06-29 NOTE — Progress Notes (Signed)
PROGRESS NOTE    SLATER MCMANAMAN  DPO:[id number] DOB: 1945/09/03 DOA: 06/27/2021 PCP: Alfonse Flavors, MD   Brief Narrative:   Lucas [name] is a 75 y.o. male with medical history significant of Parkinson's disease, prior CVA, COPD, tobacco use, hypertension, chronic lower extremity edema, hyperlipidemia, normally lives at home.  Family had brought him to ED due to what appears to be a chronic decline in his overall condition with confusion.  Patient was admitted for acute metabolic encephalopathy and associated acute kidney injury that appears to be due to rhabdomyolysis.  Patient continues to require aggressive IV fluids per nephrology.  Of care consulted with ongoing goals of care discussion pending.  Patient and family do not desire hemodialysis.  Assessment & Plan:   Active Problems:   Parkinson's disease ([place])   Generalized weakness   Essential hypertension   Hyperlipidemia   COPD (chronic obstructive pulmonary disease) (H[facility])   AKI (acute kidney injury) (H[facility])   Acute metabolic encephalopathy   Bladder mass   Oliguric acute kidney injury secondary to rhabdomyolysis -Also with decreased p.o. intake in the setting of ongoing diuretic use and lisinopril. -Imaging of the abdomen did not reveal any hydronephrosis -Appreciate nephrology recommendations with plans to continue aggressive IV fluid -Patient is not a candidate for dialysis and palliative consultation requested -IV fluids changed to LR per nephrology with sodium bicarbonate tablets added for AGMA   Transaminitis 2/2 rhabdomyolysis -Continue to monitor   Acute metabolic encephalopathy-improving -I suspect this is related to renal failure along with recent morphine use in the setting of AKI -ABG, TSH, and ammonia within normal limits -Continue to monitor for improvement pending further improvement in renal function -Holding sedating meds   Hypertension -Holding antihypertensives now since he was likely  hypovolemic -Can consider resuming once volume issues have stabilized   Parkinson's disease -Continue on Sinemet   Hyperlipidemia -Continue statin   Bladder mass -Appears to have been present on CT imaging from 2019 and appears unchanged -This can be followed outpatient by urology   COPD -No wheezing or shortness of breath at this time -Continue as needed bronchodilators   Generalized weakness -Physical therapy evaluation -May need skilled nursing facility placement if agreeable   Goals of care -Discussed with patient's son who reports that currently patient is full code -His son also did mention that patient is being followed by hospice, and may have an aide to help with his bathing -He did stress the patient is currently not on comfort care -We will need further information from participating hospice.     DVT prophylaxis: Heparin Code Status: DNI Family Communication: Discussed with son on phone 11/2 Disposition Plan:  Status is: Inpatient   Remains inpatient appropriate because: Need for IV fluid.     Skin Assessment:   I have examined the patient's skin and I agree with the wound assessment as performed by the wound care RN as outlined below:   Pressure Injury 03/04/21 Sacrum Mid Stage 2 -  Partial thickness loss of dermis presenting as a shallow open injury with a red, pink wound bed without slough. (Active)  03/04/21 2245  Location: Sacrum  Location Orientation: Mid  Staging: Stage 2 -  Partial thickness loss of dermis presenting as a shallow open injury with a red, pink wound bed without slough.  Wound Description (Comments):   Present on Admission: Yes      Consultants:  Nephrology Palliative care   Procedures:  See below   Antimicrobials:  None  Subjective: Patient seen and evaluated today with some improvements in mentation today.  He is noted to have minimal, dark urine output this morning.  Objective: Vitals:   06/28/21 1419 06/28/21 1422  06/28/21 2114 06/29/21 0533  BP:  (!) 120/102 134/75 (!) 139/42  Pulse: 83 83 86 71  Resp: 20 20 20 19   Temp:   98.6 F (37 C) 99.6 F (37.6 C)  TempSrc:   Oral Oral  SpO2: 91% 94% 93% 98%  Weight:      Height:        Intake/Output Summary (Last 24 hours) at 06/29/2021 0945 Last data filed at 06/28/2021 1800 Gross per 24 hour  Intake 1692.93 ml  Output --  Net 1692.93 ml   Filed Weights   06/27/21 1109 06/27/21 1546  Weight: 90.7 kg 81.2 kg    Examination:  General exam: Appears calm and comfortable, less confused Respiratory system: Clear to auscultation. Respiratory effort normal. Cardiovascular system: S1 & S2 heard, RRR.  Gastrointestinal system: Abdomen is soft Central nervous system: Alert and awake Extremities: No edema Skin: No significant lesions noted Psychiatry: Flat affect. Foley with scant, dark urine output    Data Reviewed: I have personally reviewed following labs and imaging studies  CBC: Recent Labs  Lab 06/27/21 1140 06/28/21 0447 06/29/21 0451  WBC 13.4* 13.5* 13.9*  HGB 15.6 13.4 11.2*  HCT 46.6 40.6 34.7*  MCV 95.7 97.1 98.9  PLT 248 202 893   Basic Metabolic Panel: Recent Labs  Lab 06/27/21 1140 06/28/21 0447 06/28/21 1136 06/29/21 0451  NA 139 139 139 141  K 4.5 5.3* 4.4 4.6  CL 96* 102 101 107  CO2 25 21* 21* 18*  GLUCOSE 87 111* 121* 86  BUN 67* 84* 91* 99*  CREATININE 4.61* 5.46* 5.72* 6.32*  CALCIUM 8.7* 7.5* 7.6* 6.9*  MG  --   --   --  2.4   GFR: Estimated Creatinine Clearance: 10.1 mL/min (A) (by C-G formula based on SCr of 6.32 mg/dL (H)). Liver Function Tests: Recent Labs  Lab 06/27/21 1140 06/28/21 0447 06/29/21 0451  AST 207* 711* 648*  ALT 31 20 17   ALKPHOS 82 61 55  BILITOT 2.4* 1.7* 1.0  PROT 8.1 6.6 5.5*  ALBUMIN 4.4 3.3* 2.4*   Recent Labs  Lab 06/27/21 1140  LIPASE 22   Recent Labs  Lab 06/27/21 1845  AMMONIA 26   Coagulation Profile: No results for input(s): INR, PROTIME in the last  168 hours. Cardiac Enzymes: Recent Labs  Lab 06/28/21 0447 06/29/21 0451  CKTOTAL >50,000* 44,762*   BNP (last 3 results) No results for input(s): PROBNP in the last 8760 hours. HbA1C: No results for input(s): HGBA1C in the last 72 hours. CBG: No results for input(s): GLUCAP in the last 168 hours. Lipid Profile: No results for input(s): CHOL, HDL, LDLCALC, TRIG, CHOLHDL, LDLDIRECT in the last 72 hours. Thyroid Function Tests: Recent Labs    06/27/21 1845  TSH 3.349   Anemia Panel: No results for input(s): VITAMINB12, FOLATE, FERRITIN, TIBC, IRON, RETICCTPCT in the last 72 hours. Sepsis Labs: No results for input(s): PROCALCITON, LATICACIDVEN in the last 168 hours.  Recent Results (from the past 240 hour(s))  Resp Panel by RT-PCR (Flu A&B, Covid) Nasopharyngeal Swab     Status: None   Collection Time: 06/27/21 12:07 PM   Specimen: Nasopharyngeal Swab; Nasopharyngeal(NP) swabs in vial transport medium  Result Value Ref Range Status   SARS Coronavirus 2 by RT PCR NEGATIVE NEGATIVE Final  Comment: (NOTE) SARS-CoV-2 target nucleic acids are NOT DETECTED.  The SARS-CoV-2 RNA is generally detectable in upper respiratory specimens during the acute phase of infection. The lowest concentration of SARS-CoV-2 viral copies this assay can detect is 138 copies/mL. A negative result does not preclude SARS-Cov-2 infection and should not be used as the sole basis for treatment or other patient management decisions. A negative result may occur with  improper specimen collection/handling, submission of specimen other than nasopharyngeal swab, presence of viral mutation(s) within the areas targeted by this assay, and inadequate number of viral copies(<138 copies/mL). A negative result must be combined with clinical observations, patient history, and epidemiological information. The expected result is Negative.  Fact Sheet for Patients:  EntrepreneurPulse.com.au  Fact  Sheet for Healthcare Providers:  IncredibleEmployment.be  This test is no t yet approved or cleared by the [place] FDA and  has been authorized for detection and/or diagnosis of SARS-CoV-2 by FDA under an Emergency Use Authorization (EUA). This EUA will remain  in effect (meaning this test can be used) for the duration of the COVID-19 declaration under Section 564(b)(1) of the Act, 21 U.S.C.section 360bbb-3(b)(1), unless the authorization is terminated  or revoked sooner.       Influenza A by PCR NEGATIVE NEGATIVE Final   Influenza B by PCR NEGATIVE NEGATIVE Final    Comment: (NOTE) The Xpert Xpress SARS-CoV-2/FLU/RSV plus assay is intended as an aid in the diagnosis of influenza from Nasopharyngeal swab specimens and should not be used as a sole basis for treatment. Nasal washings and aspirates are unacceptable for Xpert Xpress SARS-CoV-2/FLU/RSV testing.  Fact Sheet for Patients: EntrepreneurPulse.com.au  Fact Sheet for Healthcare Providers: IncredibleEmployment.be  This test is not yet approved or cleared by the [place] FDA and has been authorized for detection and/or diagnosis of SARS-CoV-2 by FDA under an Emergency Use Authorization (EUA). This EUA will remain in effect (meaning this test can be used) for the duration of the COVID-19 declaration under Section 564(b)(1) of the Act, 21 U.S.C. section 360bbb-3(b)(1), unless the authorization is terminated or revoked.  Performed at [facility], [address], [place], [place] [address]          Radiology Studies: CT ABDOMEN PELVIS WO CONTRAST  Result Date: 06/27/2021 CLINICAL DATA:  Abdominal pain. Strong urine smell. Elevated creatinine. EXAM: CT ABDOMEN AND PELVIS WITHOUT CONTRAST TECHNIQUE: Multidetector CT imaging of the abdomen and pelvis was performed following the standard protocol without IV contrast. COMPARISON:  11/03/2017. FINDINGS: Lower  chest: No acute abnormality. Hepatobiliary: No focal liver abnormality is seen. No gallstones, gallbladder wall thickening, or biliary dilatation. Pancreas: Unremarkable. No pancreatic ductal dilatation or surrounding inflammatory changes. Spleen: Normal in size without focal abnormality. Adrenals/Urinary Tract: No adrenal masses. No right kidney. Mild left renal cortical thinning. No renal stone or hydronephrosis. Normal left ureter. Dilated tubular structure extends from just below the right adrenal gland to the right posterior bladder at the level of the ureteral trigone. This dilated ureter is contiguous with a bladder filling defect, also of the same attenuation. This appearance is concerning for a bladder malignancy, but is unchanged from the 11/03/2017 exam strongly favoring a benign etiology. Bladder is minimally distended, otherwise unremarkable. Stomach/Bowel: Normal stomach. Small bowel and colon are normal in caliber. No wall thickening. Subtle hazy opacity lies adjacent to the proximal to mid sigmoid colon where there are also multiple diverticula. This is not evident on the prior CT. No other evidence of pericolonic inflammation. Normal appendix is visualized.  Vascular/Lymphatic: Aortic atherosclerosis. No aneurysm. No enlarged lymph nodes. Reproductive: Prostate is normal in size. Other: Small fat containing left inguinal hernia.  No ascites. Musculoskeletal: No fracture or acute finding.  No bone lesion. IMPRESSION: 1. Subtle hazy opacity lies adjacent to the proximal to mid sigmoid colon, where there are multiple diverticula. This is an equivocal finding, but mild uncomplicated diverticulitis should be consider if this correlates clinically. No other evidence of an acute abnormality within the abdomen or pelvis. 2. Dilated tubular structure that is contiguous with a mass projecting within the bladder. There is no visualized right kidney, presumably congenitally absent. The dilated tubular structure  is likely the right gonadal vein. However, the course parallels the expected course of a right ureter. Both the bladder mass and the dilated tubular structure are stable compared to the 11/03/2017 CT. 3. Aortic atherosclerosis. Electronically Signed   By: Lajean Manes M.D.   On: 06/27/2021 13:49   CT Head Wo Contrast  Result Date: 06/27/2021 CLINICAL DATA:  Altered mental status. EXAM: CT HEAD WITHOUT CONTRAST TECHNIQUE: Contiguous axial images were obtained from the base of the skull through the vertex without intravenous contrast. COMPARISON:  December 21, 2019. FINDINGS: Brain: Mild chronic ischemic white matter disease is noted. Stable mild ventricular prominence is noted which may be due to mild cortical atrophy. No mass effect or midline shift is noted. There is no evidence of mass lesion, hemorrhage or acute infarction. Vascular: No hyperdense vessel or unexpected calcification. Skull: Normal. Negative for fracture or focal lesion. Sinuses/Orbits: No acute finding. Other: None. IMPRESSION: No acute intracranial abnormality seen. Stable mild ventricular dilatation as described above. Electronically Signed   By: Marijo Conception M.D.   On: 06/27/2021 13:26   DG Chest Port 1 View  Result Date: 06/27/2021 CLINICAL DATA:  Altered mental status in a 75 year old male. EXAM: PORTABLE CHEST 1 VIEW COMPARISON:  June 09, 2021. FINDINGS: EKG leads project over the chest. Trachea midline. Cardiomediastinal contours and hilar structures stable. Linear opacity present over the LEFT hemidiaphragm. No visible pneumothorax. No sign of pleural effusion. On limited assessment no acute skeletal process. IMPRESSION: Linear opacity over the LEFT hemidiaphragm may represent atelectasis or scarring. No acute cardiopulmonary process. Electronically Signed   By: Zetta Bills M.D.   On: 06/27/2021 12:10        Scheduled Meds:  aspirin EC  81 mg Oral Daily   carbidopa-levodopa  1 tablet Oral TID   heparin  5,000 Units  Subcutaneous Q8H   nystatin   Topical BID   sodium bicarbonate  1,300 mg Oral TID   Continuous Infusions:  lactated ringers       LOS: 1 day    Time spent: 35 minutes    Areta Terwilliger Darleen Crocker, DO [facility]  If 7PM-7AM, please contact night-coverage [email or web address] 06/29/2021, 9:45 AM

## 2021-06-30 DIAGNOSIS — G9341 Metabolic encephalopathy: Secondary | ICD-10-CM | POA: Diagnosis not present

## 2021-06-30 DIAGNOSIS — N179 Acute kidney failure, unspecified: Secondary | ICD-10-CM | POA: Diagnosis not present

## 2021-06-30 LAB — CK: Total CK: 34005 U/L — ABNORMAL HIGH (ref 49–397)

## 2021-06-30 LAB — CBC
HCT: 34.5 % — ABNORMAL LOW (ref 39.0–52.0)
Hemoglobin: 11.3 g/dL — ABNORMAL LOW (ref 13.0–17.0)
MCH: 32.4 pg (ref 26.0–34.0)
MCHC: 32.8 g/dL (ref 30.0–36.0)
MCV: 98.9 fL (ref 80.0–100.0)
Platelets: 158 10*3/uL (ref 150–400)
RBC: 3.49 MIL/uL — ABNORMAL LOW (ref 4.22–5.81)
RDW: 13.1 % (ref 11.5–15.5)
WBC: 12 10*3/uL — ABNORMAL HIGH (ref 4.0–10.5)
nRBC: 0 % (ref 0.0–0.2)

## 2021-06-30 LAB — COMPREHENSIVE METABOLIC PANEL
ALT: 17 U/L (ref 0–44)
AST: 538 U/L — ABNORMAL HIGH (ref 15–41)
Albumin: 2.2 g/dL — ABNORMAL LOW (ref 3.5–5.0)
Alkaline Phosphatase: 56 U/L (ref 38–126)
Anion gap: 14 (ref 5–15)
BUN: 80 mg/dL — ABNORMAL HIGH (ref 8–23)
CO2: 20 mmol/L — ABNORMAL LOW (ref 22–32)
Calcium: 7 mg/dL — ABNORMAL LOW (ref 8.9–10.3)
Chloride: 105 mmol/L (ref 98–111)
Creatinine, Ser: 7.31 mg/dL — ABNORMAL HIGH (ref 0.61–1.24)
GFR, Estimated: 7 mL/min — ABNORMAL LOW (ref >60)
Glucose, Bld: 83 mg/dL (ref 70–99)
Potassium: 4.8 mmol/L (ref 3.5–5.1)
Sodium: 139 mmol/L (ref 135–145)
Total Bilirubin: 1.2 mg/dL (ref 0.3–1.2)
Total Protein: 5.3 g/dL — ABNORMAL LOW (ref 6.5–8.1)

## 2021-06-30 LAB — MAGNESIUM: Magnesium: 2.2 mg/dL (ref 1.7–2.4)

## 2021-06-30 NOTE — Care Management Important Message (Signed)
Important Message  Patient Details  Name: Lucas [name] MRN: [id number] Date of Birth: 08-25-1946   Medicare Important Message Given:  Yes     Tommy Medal 06/30/2021, 10:42 AM

## 2021-06-30 NOTE — Plan of Care (Signed)
  Problem: Education: Goal: Knowledge of General Education information will improve Description: Including pain rating scale, medication(s)/side effects and non-pharmacologic comfort measures Outcome: Not Progressing   Problem: Health Behavior/Discharge Planning: Goal: Ability to manage health-related needs will improve Outcome: Not Progressing   Problem: Clinical Measurements: Goal: Ability to maintain clinical measurements within normal limits will improve Outcome: Not Progressing Goal: Will remain free from infection Outcome: Progressing Goal: Diagnostic test results will improve Outcome: Not Progressing Goal: Respiratory complications will improve Outcome: Progressing Goal: Cardiovascular complication will be avoided Outcome: Progressing   Problem: Activity: Goal: Risk for activity intolerance will decrease Outcome: Not Progressing   Problem: Nutrition: Goal: Adequate nutrition will be maintained Outcome: Progressing   Problem: Coping: Goal: Level of anxiety will decrease Outcome: Progressing   Problem: Elimination: Goal: Will not experience complications related to bowel motility Outcome: Progressing Goal: Will not experience complications related to urinary retention Outcome: Progressing   Problem: Pain Managment: Goal: General experience of comfort will improve Outcome: Progressing   Problem: Safety: Goal: Ability to remain free from injury will improve Outcome: Progressing   Problem: Skin Integrity: Goal: Risk for impaired skin integrity will decrease Outcome: Not Progressing

## 2021-06-30 NOTE — Plan of Care (Signed)
  Problem: Education: Goal: Knowledge of General Education information will improve Description Including pain rating scale, medication(s)/side effects and non-pharmacologic comfort measures Outcome: Progressing   Problem: Health Behavior/Discharge Planning: Goal: Ability to manage health-related needs will improve Outcome: Progressing   

## 2021-06-30 NOTE — Progress Notes (Signed)
PROGRESS NOTE    Lucas [name]  GEZ:[id number] DOB: Apr 24, 1946 DOA: 06/27/2021 PCP: Alfonse Flavors, MD   Brief Narrative:   Lucas [name] is a 75 y.o. male with medical history significant of Parkinson's disease, prior CVA, COPD, tobacco use, hypertension, chronic lower extremity edema, hyperlipidemia, normally lives at home.  Family had brought him to ED due to what appears to be a chronic decline in his overall condition with confusion.  Patient was admitted for acute metabolic encephalopathy and associated acute kidney injury that appears to be due to rhabdomyolysis.  Patient continues to require aggressive IV fluids per nephrology.  Of care consulted with ongoing goals of care discussion pending.  Patient and family do not desire hemodialysis.  Assessment & Plan:   Active Problems:   Parkinson's disease ([place])   Generalized weakness   Essential hypertension   Hyperlipidemia   COPD (chronic obstructive pulmonary disease) (H[facility])   AKI (acute kidney injury) ([place])   Acute metabolic encephalopathy   Bladder mass   Acute kidney injury secondary to rhabdomyolysis -Urine output appears to be improving -Also with decreased p.o. intake in the setting of ongoing diuretic use and lisinopril. -Imaging of the abdomen did not reveal any hydronephrosis -Appreciate nephrology recommendations with plans to continue aggressive IV fluid -Patient is not a candidate for dialysis and palliative consultation requested -IV fluids changed to LR per nephrology with sodium bicarbonate tablets added for AGMA -Continue aggressive IV fluid and repeat monitoring in a.m.   Transaminitis 2/2 rhabdomyolysis -Continue to monitor -Improving CK levels noted   Acute metabolic encephalopathy-improving -I suspect this is related to renal failure along with recent morphine use in the setting of AKI -ABG, TSH, and ammonia within normal limits -Continue to monitor for improvement pending further improvement  in renal function -Holding sedating meds   Hypertension -Holding antihypertensives now since he was likely hypovolemic -Can consider resuming once volume issues have stabilized   Parkinson's disease -Continue on Sinemet   Hyperlipidemia -Continue statin   Bladder mass -Appears to have been present on CT imaging from 2019 and appears unchanged -This can be followed outpatient by urology   COPD -No wheezing or shortness of breath at this time -Continue as needed bronchodilators   Generalized weakness -Physical therapy evaluation -May need skilled nursing facility placement if agreeable   Goals of care -Discussed with patient's son who reports that currently patient is full code -His son also did mention that patient is being followed by hospice, and may have an aide to help with his bathing -He did stress the patient is currently not on comfort care -We will need further information from participating hospice.     DVT prophylaxis: Heparin Code Status: DNI Family Communication: Discussed with son on phone 11/4 Disposition Plan:  Status is: Inpatient   Remains inpatient appropriate because: Need for IV fluid.     Skin Assessment:   I have examined the patient's skin and I agree with the wound assessment as performed by the wound care RN as outlined below:   Pressure Injury 03/04/21 Sacrum Mid Stage 2 -  Partial thickness loss of dermis presenting as a shallow open injury with a red, pink wound bed without slough. (Active)  03/04/21 2245  Location: Sacrum  Location Orientation: Mid  Staging: Stage 2 -  Partial thickness loss of dermis presenting as a shallow open injury with a red, pink wound bed without slough.  Wound Description (Comments):   Present on Admission: Yes  Consultants:  Nephrology Palliative care   Procedures:  See below   Antimicrobials:  None  Subjective: Patient seen and evaluated today with no new acute complaints or concerns. No  acute concerns or events noted overnight.  He seems to be more alert and oriented today.  Objective: Vitals:   06/29/21 0533 06/29/21 1328 06/29/21 2133 06/30/21 0528  BP: (!) 139/42 (!) 136/59 (!) 155/62 (!) 147/62  Pulse: 71 72 73 72  Resp: 19 19 17 18   Temp: 99.6 F (37.6 C) 98.7 F (37.1 C) 98.5 F (36.9 C) 98.6 F (37 C)  TempSrc: Oral Oral Oral   SpO2: 98% 98% 94% 96%  Weight:      Height:        Intake/Output Summary (Last 24 hours) at 06/30/2021 0932 Last data filed at 06/30/2021 0900 Gross per 24 hour  Intake 1372.15 ml  Output 1400 ml  Net -27.85 ml   Filed Weights   06/27/21 1109 06/27/21 1546  Weight: 90.7 kg 81.2 kg    Examination:  General exam: Appears calm and comfortable  Respiratory system: Clear to auscultation. Respiratory effort normal. Cardiovascular system: S1 & S2 heard, RRR.  Gastrointestinal system: Abdomen is soft Central nervous system: Alert and awake Extremities: No edema Skin: No significant lesions noted Psychiatry: Flat affect.    Data Reviewed: I have personally reviewed following labs and imaging studies  CBC: Recent Labs  Lab 06/27/21 1140 06/28/21 0447 06/29/21 0451 06/30/21 0406  WBC 13.4* 13.5* 13.9* 12.0*  HGB 15.6 13.4 11.2* 11.3*  HCT 46.6 40.6 34.7* 34.5*  MCV 95.7 97.1 98.9 98.9  PLT 248 202 156 831   Basic Metabolic Panel: Recent Labs  Lab 06/27/21 1140 06/28/21 0447 06/28/21 1136 06/29/21 0451 06/30/21 0406  NA 139 139 139 141 139  K 4.5 5.3* 4.4 4.6 4.8  CL 96* 102 101 107 105  CO2 25 21* 21* 18* 20*  GLUCOSE 87 111* 121* 86 83  BUN 67* 84* 91* 99* 80*  CREATININE 4.61* 5.46* 5.72* 6.32* 7.31*  CALCIUM 8.7* 7.5* 7.6* 6.9* 7.0*  MG  --   --   --  2.4 2.2   GFR: Estimated Creatinine Clearance: 8.7 mL/min (A) (by C-G formula based on SCr of 7.31 mg/dL (H)). Liver Function Tests: Recent Labs  Lab 06/27/21 1140 06/28/21 0447 06/29/21 0451 06/30/21 0406  AST 207* 711* 648* 538*  ALT 31 20 17  17   ALKPHOS 82 61 55 56  BILITOT 2.4* 1.7* 1.0 1.2  PROT 8.1 6.6 5.5* 5.3*  ALBUMIN 4.4 3.3* 2.4* 2.2*   Recent Labs  Lab 06/27/21 1140  LIPASE 22   Recent Labs  Lab 06/27/21 1845  AMMONIA 26   Coagulation Profile: No results for input(s): INR, PROTIME in the last 168 hours. Cardiac Enzymes: Recent Labs  Lab 06/28/21 0447 06/29/21 0451 06/30/21 0406  CKTOTAL >50,000* 51,761* 34,005*   BNP (last 3 results) No results for input(s): PROBNP in the last 8760 hours. HbA1C: No results for input(s): HGBA1C in the last 72 hours. CBG: No results for input(s): GLUCAP in the last 168 hours. Lipid Profile: No results for input(s): CHOL, HDL, LDLCALC, TRIG, CHOLHDL, LDLDIRECT in the last 72 hours. Thyroid Function Tests: Recent Labs    06/27/21 1845  TSH 3.349   Anemia Panel: No results for input(s): VITAMINB12, FOLATE, FERRITIN, TIBC, IRON, RETICCTPCT in the last 72 hours. Sepsis Labs: No results for input(s): PROCALCITON, LATICACIDVEN in the last 168 hours.  Recent Results (from the  past 240 hour(s))  Resp Panel by RT-PCR (Flu A&B, Covid) Nasopharyngeal Swab     Status: None   Collection Time: 06/27/21 12:07 PM   Specimen: Nasopharyngeal Swab; Nasopharyngeal(NP) swabs in vial transport medium  Result Value Ref Range Status   SARS Coronavirus 2 by RT PCR NEGATIVE NEGATIVE Final    Comment: (NOTE) SARS-CoV-2 target nucleic acids are NOT DETECTED.  The SARS-CoV-2 RNA is generally detectable in upper respiratory specimens during the acute phase of infection. The lowest concentration of SARS-CoV-2 viral copies this assay can detect is 138 copies/mL. A negative result does not preclude SARS-Cov-2 infection and should not be used as the sole basis for treatment or other patient management decisions. A negative result may occur with  improper specimen collection/handling, submission of specimen other than nasopharyngeal swab, presence of viral mutation(s) within the areas  targeted by this assay, and inadequate number of viral copies(<138 copies/mL). A negative result must be combined with clinical observations, patient history, and epidemiological information. The expected result is Negative.  Fact Sheet for Patients:  EntrepreneurPulse.com.au  Fact Sheet for Healthcare Providers:  IncredibleEmployment.be  This test is no t yet approved or cleared by the [place] FDA and  has been authorized for detection and/or diagnosis of SARS-CoV-2 by FDA under an Emergency Use Authorization (EUA). This EUA will remain  in effect (meaning this test can be used) for the duration of the COVID-19 declaration under Section 564(b)(1) of the Act, 21 U.S.C.section 360bbb-3(b)(1), unless the authorization is terminated  or revoked sooner.       Influenza A by PCR NEGATIVE NEGATIVE Final   Influenza B by PCR NEGATIVE NEGATIVE Final    Comment: (NOTE) The Xpert Xpress SARS-CoV-2/FLU/RSV plus assay is intended as an aid in the diagnosis of influenza from Nasopharyngeal swab specimens and should not be used as a sole basis for treatment. Nasal washings and aspirates are unacceptable for Xpert Xpress SARS-CoV-2/FLU/RSV testing.  Fact Sheet for Patients: EntrepreneurPulse.com.au  Fact Sheet for Healthcare Providers: IncredibleEmployment.be  This test is not yet approved or cleared by the [place] FDA and has been authorized for detection and/or diagnosis of SARS-CoV-2 by FDA under an Emergency Use Authorization (EUA). This EUA will remain in effect (meaning this test can be used) for the duration of the COVID-19 declaration under Section 564(b)(1) of the Act, 21 U.S.C. section 360bbb-3(b)(1), unless the authorization is terminated or revoked.  Performed at [facility], [address], [place], [place] [address]          Radiology Studies: No results found.      Scheduled  Meds:  aspirin EC  81 mg Oral Daily   carbidopa-levodopa  1 tablet Oral TID   heparin  5,000 Units Subcutaneous Q8H   nystatin   Topical BID   sodium bicarbonate  1,300 mg Oral TID   Continuous Infusions:  lactated ringers 125 mL/hr at 06/30/21 0537     LOS: 2 days    Time spent: 35 minutes    Keller Bounds Darleen Crocker, DO [facility]  If 7PM-7AM, please contact night-coverage [email or web address] 06/30/2021, 9:32 AM

## 2021-06-30 NOTE — Plan of Care (Signed)
  Problem: Education: Goal: Knowledge of General Education information will improve Description: Including pain rating scale, medication(s)/side effects and non-pharmacologic comfort measures Outcome: Progressing   Problem: Clinical Measurements: Goal: Diagnostic test results will improve Outcome: Progressing Goal: Respiratory complications will improve Outcome: Progressing   Problem: Elimination: Goal: Will not experience complications related to urinary retention Outcome: Progressing

## 2021-06-30 NOTE — Progress Notes (Signed)
[facility] Progress Note    Assessment/ Plan:   AKI: secondary to rhabdo and hemodynamic insults from prolonged prerenal injury in the context of solitary kidney & lasix + HCTZ + lisinopril use. -worsening kidney function. Overall, my recommendation is that he is not an ideal HD candidate given underlying comorbidites (parkinson's, low functional status, concerns for early dementia) and risks may outweigh benefits. I have discussed this with Ronalee Belts (son) on 11/2. Over the last few days, I have discussed this with the patient and each time has said 'no' to dialysis. Appreciate palliative consult, addressing goals of care. If further deterioration of kidney function then may need to consider comfort care -continue with isotonic fluids for now--monitor volume status closely (overall euvolemic today) -lasix, HCTZ, and lisinopril on hold -Continue to monitor daily Cr, Dose meds for GFR<15 -Monitor Daily strict I/Os, Daily weight  -Maintain MAP>65 for optimal renal perfusion.  -avoid further nephrotoxins including NSAIDS, Morphine.  Unless absolutely necessary, avoid CT with contrast and/or MRI with gadolinium.     Rhabdomyolysis -unknown down time at home, monitor CK, 1L LR bolus today followed by 125cc/hr. Monitor volume status closely -CK >50k 11/1. Improving, monitor daily   AMS -per primary. Possibly secondary to morphine in AKI + uremia. There is concern for early dementia as well per palliative   Leukocytosis -possible early diverticulitis? Received flagyl and cipro x 1 dose. Per primary   Parkinson's -per primary, appreciate palliative care's assistance   Hypertension: -holding diuretics and ACEI. Monitor for now   Hyperkalemia -resolved, monitor for now, low K diet   AGMA -on nahco3 1300mg  tid, changing fluids to LR. CO2 improved   Bladder mass -unchanged in appearance since 2019, can be followed as an outpatient. Of note, he still smoking   Recommendations  conveyed to primary service.    [facility]  Subjective:   No acute events. Uop ~1.4L. Remains aaox2 (self, location) but does not know which hospital he is in. Patient does not offer any complaints at this time.   Objective:   BP (!) 147/62 (BP Location: Right Arm)   Pulse 72   Temp 98.6 F (37 C)   Resp 18   Ht 5\' 8"  (1.727 m)   Wt 81.2 kg   SpO2 96%   BMI 27.22 kg/m   Intake/Output Summary (Last 24 hours) at 06/30/2021 0940 Last data filed at 06/30/2021 0900 Gross per 24 hour  Intake 1372.15 ml  Output 1400 ml  Net -27.85 ml   Weight change:   Physical Exam: VHQ:[id number] ill appearing, NAD CVS:s1s2 Resp:cta bl LKG:MWNU Ext:no sig edema Neuro: aaox2 (self, location), +tremors GU: external foley  Imaging: No results found.  Labs: BMET Recent Labs  Lab 06/27/21 1140 06/28/21 0447 06/28/21 1136 06/29/21 0451 06/30/21 0406  NA 139 139 139 141 139  K 4.5 5.3* 4.4 4.6 4.8  CL 96* 102 101 107 105  CO2 25 21* 21* 18* 20*  GLUCOSE 87 111* 121* 86 83  BUN 67* 84* 91* 99* 80*  CREATININE 4.61* 5.46* 5.72* 6.32* 7.31*  CALCIUM 8.7* 7.5* 7.6* 6.9* 7.0*   CBC Recent Labs  Lab 06/27/21 1140 06/28/21 0447 06/29/21 0451 06/30/21 0406  WBC 13.4* 13.5* 13.9* 12.0*  HGB 15.6 13.4 11.2* 11.3*  HCT 46.6 40.6 34.7* 34.5*  MCV 95.7 97.1 98.9 98.9  PLT 248 202 156 158    Medications:     aspirin EC  81 mg Oral Daily   carbidopa-levodopa  1 tablet Oral TID   heparin  5,000 Units Subcutaneous Q8H   nystatin   Topical BID   sodium bicarbonate  1,300 mg Oral TID      Gean Quint, MD [facility] 06/30/2021, 9:40 AM

## 2021-07-01 DIAGNOSIS — G9341 Metabolic encephalopathy: Secondary | ICD-10-CM | POA: Diagnosis not present

## 2021-07-01 DIAGNOSIS — N179 Acute kidney failure, unspecified: Secondary | ICD-10-CM | POA: Diagnosis not present

## 2021-07-01 LAB — COMPREHENSIVE METABOLIC PANEL
ALT: 16 U/L (ref 0–44)
AST: 386 U/L — ABNORMAL HIGH (ref 15–41)
Albumin: 2.2 g/dL — ABNORMAL LOW (ref 3.5–5.0)
Alkaline Phosphatase: 55 U/L (ref 38–126)
Anion gap: 14 (ref 5–15)
BUN: 118 mg/dL — ABNORMAL HIGH (ref 8–23)
CO2: 20 mmol/L — ABNORMAL LOW (ref 22–32)
Calcium: 6.9 mg/dL — ABNORMAL LOW (ref 8.9–10.3)
Chloride: 104 mmol/L (ref 98–111)
Creatinine, Ser: 8.26 mg/dL — ABNORMAL HIGH (ref 0.61–1.24)
GFR, Estimated: 6 mL/min — ABNORMAL LOW (ref >60)
Glucose, Bld: 107 mg/dL — ABNORMAL HIGH (ref 70–99)
Potassium: 5.1 mmol/L (ref 3.5–5.1)
Sodium: 138 mmol/L (ref 135–145)
Total Bilirubin: 0.8 mg/dL (ref 0.3–1.2)
Total Protein: 5.3 g/dL — ABNORMAL LOW (ref 6.5–8.1)

## 2021-07-01 LAB — CBC
HCT: 35.7 % — ABNORMAL LOW (ref 39.0–52.0)
Hemoglobin: 11.5 g/dL — ABNORMAL LOW (ref 13.0–17.0)
MCH: 31.9 pg (ref 26.0–34.0)
MCHC: 32.2 g/dL (ref 30.0–36.0)
MCV: 98.9 fL (ref 80.0–100.0)
Platelets: 170 10*3/uL (ref 150–400)
RBC: 3.61 MIL/uL — ABNORMAL LOW (ref 4.22–5.81)
RDW: 12.6 % (ref 11.5–15.5)
WBC: 12.6 10*3/uL — ABNORMAL HIGH (ref 4.0–10.5)
nRBC: 0 % (ref 0.0–0.2)

## 2021-07-01 LAB — CK: Total CK: 30202 U/L — ABNORMAL HIGH (ref 49–397)

## 2021-07-01 LAB — MAGNESIUM: Magnesium: 2.2 mg/dL (ref 1.7–2.4)

## 2021-07-01 MED ORDER — GABAPENTIN 300 MG PO CAPS
300.0000 mg | ORAL_CAPSULE | Freq: Two times a day (BID) | ORAL | Status: DC
  Administered 2021-07-01 – 2021-07-05 (×9): 300 mg via ORAL
  Filled 2021-07-01 (×10): qty 1

## 2021-07-01 MED ORDER — SODIUM ZIRCONIUM CYCLOSILICATE 10 G PO PACK
10.0000 g | PACK | Freq: Once | ORAL | Status: AC
  Administered 2021-07-01: 10 g via ORAL
  Filled 2021-07-01: qty 1

## 2021-07-01 MED ORDER — LORAZEPAM 2 MG/ML IJ SOLN
2.0000 mg | Freq: Four times a day (QID) | INTRAMUSCULAR | Status: DC | PRN
  Administered 2021-07-01 – 2021-07-12 (×8): 2 mg via INTRAVENOUS
  Filled 2021-07-01 (×8): qty 1

## 2021-07-01 NOTE — Progress Notes (Signed)
PROGRESS NOTE    Lucas [name]  HER:[id number] DOB: 03-Mar-1946 DOA: 06/27/2021 PCP: Alfonse Flavors, MD   Brief Narrative:   Lucas [name] is a 75 y.o. male with medical history significant of Parkinson's disease, prior CVA, COPD, tobacco use, hypertension, chronic lower extremity edema, hyperlipidemia, normally lives at home.  Family had brought him to ED due to what appears to be a chronic decline in his overall condition with confusion.  Patient was admitted for acute metabolic encephalopathy and associated acute kidney injury that appears to be due to rhabdomyolysis.  Patient continues to require aggressive IV fluids per nephrology.  Of care consulted with ongoing goals of care discussion pending.  Patient and family now possibly desiring HD if needed.  Assessment & Plan:   Active Problems:   Parkinson's disease ([place])   Generalized weakness   Essential hypertension   Hyperlipidemia   COPD (chronic obstructive pulmonary disease) (HCC)   AKI (acute kidney injury) (HCC)   Acute metabolic encephalopathy   Bladder mass   Acute kidney injury secondary to rhabdomyolysis -Urine output appears nonoliguric, but Cr continues to increase; UO almost 2 liters -Also with decreased p.o. intake in the setting of ongoing diuretic use and lisinopril. -Imaging of the abdomen did not reveal any hydronephrosis -Appreciate nephrology recommendations with plans to continue aggressive IV fluid -Patient is not a candidate for dialysis and palliative consultation requested -IV fluids changed to LR per nephrology with sodium bicarbonate tablets added for AGMA -Continue aggressive IV fluid and repeat monitoring in a.m. -Potassium trending up, will give Lokelma today and follow   Transaminitis 2/2 rhabdomyolysis -Continue to monitor -Improving CK levels noted   Acute metabolic encephalopathy-improving -I suspect this is related to renal failure along with recent morphine use in the setting of  AKI -ABG, TSH, and ammonia within normal limits -Continue to monitor for improvement pending further improvement in renal function -Holding sedating meds   Hypertension -Holding antihypertensives now since he was likely hypovolemic -Can consider resuming once volume issues have stabilized   Parkinson's disease -Continue on Sinemet   Hyperlipidemia -Continue statin   Bladder mass -Appears to have been present on CT imaging from 2019 and appears unchanged -This can be followed outpatient by urology   COPD -No wheezing or shortness of breath at this time -Continue as needed bronchodilators   Generalized weakness -Physical therapy evaluation -May need skilled nursing facility placement if agreeable   Goals of care -Discussed with patient's son who reports that currently patient is full code -His son also did mention that patient is being followed by hospice, and may have an aide to help with his bathing -He did stress the patient is currently not on comfort care -We will need further information from participating hospice.     DVT prophylaxis: Heparin Code Status: DNI Family Communication: Discussed with sons Audry Pili and Ronalee Belts on phone 11/5 Disposition Plan:  Status is: Inpatient   Remains inpatient appropriate because: Need for IV fluid.     Skin Assessment:   I have examined the patient's skin and I agree with the wound assessment as performed by the wound care RN as outlined below:   Pressure Injury 03/04/21 Sacrum Mid Stage 2 -  Partial thickness loss of dermis presenting as a shallow open injury with a red, pink wound bed without slough. (Active)  03/04/21 2245  Location: Sacrum  Location Orientation: Mid  Staging: Stage 2 -  Partial thickness loss of dermis presenting as a shallow open injury  with a red, pink wound bed without slough.  Wound Description (Comments):   Present on Admission: Yes      Consultants:  Nephrology Palliative care   Procedures:  See  below   Antimicrobials:  None  Subjective: Patient seen and evaluated today with no new acute complaints or concerns. No acute concerns or events noted overnight. His mentation is improving as well as his urine output.  Objective: Vitals:   06/30/21 0528 06/30/21 1238 06/30/21 2058 07/01/21 0458  BP: (!) 147/62 121/60 (!) 151/61 (!) 150/48  Pulse: 72 73 72 67  Resp: 18 20 20 19   Temp: 98.6 F (37 C) 98.8 F (37.1 C) 98.8 F (37.1 C) 98.4 F (36.9 C)  TempSrc:   Oral Oral  SpO2: 96% 94% 94% 95%  Weight:      Height:        Intake/Output Summary (Last 24 hours) at 07/01/2021 1126 Last data filed at 07/01/2021 0500 Gross per 24 hour  Intake 1160 ml  Output 550 ml  Net 610 ml   Filed Weights   06/27/21 1109 06/27/21 1546  Weight: 90.7 kg 81.2 kg    Examination:  General exam: Appears calm and comfortable  Respiratory system: Clear to auscultation. Respiratory effort normal. Cardiovascular system: S1 & S2 heard, RRR.  Gastrointestinal system: Abdomen is soft Central nervous system: Alert and awake Extremities: No edema Skin: No significant lesions noted Psychiatry: Flat affect.    Data Reviewed: I have personally reviewed following labs and imaging studies  CBC: Recent Labs  Lab 06/27/21 1140 06/28/21 0447 06/29/21 0451 06/30/21 0406 07/01/21 0431  WBC 13.4* 13.5* 13.9* 12.0* 12.6*  HGB 15.6 13.4 11.2* 11.3* 11.5*  HCT 46.6 40.6 34.7* 34.5* 35.7*  MCV 95.7 97.1 98.9 98.9 98.9  PLT 248 202 156 158 921   Basic Metabolic Panel: Recent Labs  Lab 06/28/21 0447 06/28/21 1136 06/29/21 0451 06/30/21 0406 07/01/21 0431  NA 139 139 141 139 138  K 5.3* 4.4 4.6 4.8 5.1  CL 102 101 107 105 104  CO2 21* 21* 18* 20* 20*  GLUCOSE 111* 121* 86 83 107*  BUN 84* 91* 99* 80* 118*  CREATININE 5.46* 5.72* 6.32* 7.31* 8.26*  CALCIUM 7.5* 7.6* 6.9* 7.0* 6.9*  MG  --   --  2.4 2.2 2.2   GFR: Estimated Creatinine Clearance: 7.7 mL/min (A) (by C-G formula based on  SCr of 8.26 mg/dL (H)). Liver Function Tests: Recent Labs  Lab 06/27/21 1140 06/28/21 0447 06/29/21 0451 06/30/21 0406 07/01/21 0431  AST 207* 711* 648* 538* 386*  ALT 31 20 17 17 16   ALKPHOS 82 61 55 56 55  BILITOT 2.4* 1.7* 1.0 1.2 0.8  PROT 8.1 6.6 5.5* 5.3* 5.3*  ALBUMIN 4.4 3.3* 2.4* 2.2* 2.2*   Recent Labs  Lab 06/27/21 1140  LIPASE 22   Recent Labs  Lab 06/27/21 1845  AMMONIA 26   Coagulation Profile: No results for input(s): INR, PROTIME in the last 168 hours. Cardiac Enzymes: Recent Labs  Lab 06/28/21 0447 06/29/21 0451 06/30/21 0406 07/01/21 0431  CKTOTAL >50,000* 19,417* 34,005* 30,202*   BNP (last 3 results) No results for input(s): PROBNP in the last 8760 hours. HbA1C: No results for input(s): HGBA1C in the last 72 hours. CBG: No results for input(s): GLUCAP in the last 168 hours. Lipid Profile: No results for input(s): CHOL, HDL, LDLCALC, TRIG, CHOLHDL, LDLDIRECT in the last 72 hours. Thyroid Function Tests: No results for input(s): TSH, T4TOTAL, FREET4, T3FREE, THYROIDAB  in the last 72 hours. Anemia Panel: No results for input(s): VITAMINB12, FOLATE, FERRITIN, TIBC, IRON, RETICCTPCT in the last 72 hours. Sepsis Labs: No results for input(s): PROCALCITON, LATICACIDVEN in the last 168 hours.  Recent Results (from the past 240 hour(s))  Resp Panel by RT-PCR (Flu A&B, Covid) Nasopharyngeal Swab     Status: None   Collection Time: 06/27/21 12:07 PM   Specimen: Nasopharyngeal Swab; Nasopharyngeal(NP) swabs in vial transport medium  Result Value Ref Range Status   SARS Coronavirus 2 by RT PCR NEGATIVE NEGATIVE Final    Comment: (NOTE) SARS-CoV-2 target nucleic acids are NOT DETECTED.  The SARS-CoV-2 RNA is generally detectable in upper respiratory specimens during the acute phase of infection. The lowest concentration of SARS-CoV-2 viral copies this assay can detect is 138 copies/mL. A negative result does not preclude SARS-Cov-2 infection and  should not be used as the sole basis for treatment or other patient management decisions. A negative result may occur with  improper specimen collection/handling, submission of specimen other than nasopharyngeal swab, presence of viral mutation(s) within the areas targeted by this assay, and inadequate number of viral copies(<138 copies/mL). A negative result must be combined with clinical observations, patient history, and epidemiological information. The expected result is Negative.  Fact Sheet for Patients:  EntrepreneurPulse.com.au  Fact Sheet for Healthcare Providers:  IncredibleEmployment.be  This test is no t yet approved or cleared by the [place] FDA and  has been authorized for detection and/or diagnosis of SARS-CoV-2 by FDA under an Emergency Use Authorization (EUA). This EUA will remain  in effect (meaning this test can be used) for the duration of the COVID-19 declaration under Section 564(b)(1) of the Act, 21 U.S.C.section 360bbb-3(b)(1), unless the authorization is terminated  or revoked sooner.       Influenza A by PCR NEGATIVE NEGATIVE Final   Influenza B by PCR NEGATIVE NEGATIVE Final    Comment: (NOTE) The Xpert Xpress SARS-CoV-2/FLU/RSV plus assay is intended as an aid in the diagnosis of influenza from Nasopharyngeal swab specimens and should not be used as a sole basis for treatment. Nasal washings and aspirates are unacceptable for Xpert Xpress SARS-CoV-2/FLU/RSV testing.  Fact Sheet for Patients: EntrepreneurPulse.com.au  Fact Sheet for Healthcare Providers: IncredibleEmployment.be  This test is not yet approved or cleared by the [place] FDA and has been authorized for detection and/or diagnosis of SARS-CoV-2 by FDA under an Emergency Use Authorization (EUA). This EUA will remain in effect (meaning this test can be used) for the duration of the COVID-19 declaration  under Section 564(b)(1) of the Act, 21 U.S.C. section 360bbb-3(b)(1), unless the authorization is terminated or revoked.  Performed at [facility], [address] [place], [place] [address]          Radiology Studies: No results found.      Scheduled Meds:  aspirin EC  81 mg Oral Daily   carbidopa-levodopa  1 tablet Oral TID   gabapentin  300 mg Oral BID   heparin  5,000 Units Subcutaneous Q8H   nystatin   Topical BID   sodium bicarbonate  1,300 mg Oral TID   sodium zirconium cyclosilicate  10 g Oral Once   Continuous Infusions:  lactated ringers 125 mL/hr at 07/01/21 0501     LOS: 3 days    Time spent: 35 minutes    Aleanna Menge Darleen Crocker, DO [facility]  If 7PM-7AM, please contact night-coverage [email or web address] 07/01/2021, 11:26 AM

## 2021-07-01 NOTE — Plan of Care (Signed)
  Problem: Education: Goal: Knowledge of General Education information will improve Description: Including pain rating scale, medication(s)/side effects and non-pharmacologic comfort measures 07/01/2021 1802 by Melony Overly, RN Outcome: Not Progressing 07/01/2021 1801 by Melony Overly, RN Outcome: Not Progressing   Problem: Health Behavior/Discharge Planning: Goal: Ability to manage health-related needs will improve 07/01/2021 1802 by Melony Overly, RN Outcome: Not Progressing 07/01/2021 1801 by Melony Overly, RN Outcome: Not Progressing   Problem: Clinical Measurements: Goal: Ability to maintain clinical measurements within normal limits will improve 07/01/2021 1802 by Melony Overly, RN Outcome: Progressing 07/01/2021 1801 by Melony Overly, RN Outcome: Not Progressing Goal: Will remain free from infection 07/01/2021 1802 by Melony Overly, RN Outcome: Progressing 07/01/2021 1801 by Melony Overly, RN Outcome: Progressing Goal: Diagnostic test results will improve 07/01/2021 1802 by Melony Overly, RN Outcome: Not Progressing 07/01/2021 1801 by Melony Overly, RN Outcome: Not Progressing Goal: Respiratory complications will improve 07/01/2021 1802 by Melony Overly, RN Outcome: Progressing 07/01/2021 1801 by Melony Overly, RN Outcome: Progressing Goal: Cardiovascular complication will be avoided 07/01/2021 1802 by Melony Overly, RN Outcome: Progressing 07/01/2021 1801 by Melony Overly, RN Outcome: Progressing   Problem: Activity: Goal: Risk for activity intolerance will decrease 07/01/2021 1802 by Melony Overly, RN Outcome: Not Progressing 07/01/2021 1801 by Melony Overly, RN Outcome: Not Progressing   Problem: Nutrition: Goal: Adequate nutrition will be maintained 07/01/2021 1802 by Melony Overly, RN Outcome: Progressing 07/01/2021 1801 by Melony Overly, RN Outcome: Progressing   Problem: Coping: Goal: Level of anxiety will decrease 07/01/2021  1802 by Melony Overly, RN Outcome: Progressing 07/01/2021 1801 by Melony Overly, RN Outcome: Not Progressing   Problem: Elimination: Goal: Will not experience complications related to bowel motility 07/01/2021 1802 by Melony Overly, RN Outcome: Progressing 07/01/2021 1801 by Melony Overly, RN Outcome: Progressing Goal: Will not experience complications related to urinary retention 07/01/2021 1802 by Melony Overly, RN Outcome: Progressing 07/01/2021 1801 by Melony Overly, RN Outcome: Progressing   Problem: Pain Managment: Goal: General experience of comfort will improve 07/01/2021 1802 by Melony Overly, RN Outcome: Progressing 07/01/2021 1801 by Melony Overly, RN Outcome: Progressing   Problem: Safety: Goal: Ability to remain free from injury will improve 07/01/2021 1802 by Melony Overly, RN Outcome: Progressing 07/01/2021 1801 by Melony Overly, RN Outcome: Not Progressing   Problem: Skin Integrity: Goal: Risk for impaired skin integrity will decrease 07/01/2021 1802 by Melony Overly, RN Outcome: Not Progressing 07/01/2021 1801 by Melony Overly, RN Outcome: Not Progressing

## 2021-07-01 NOTE — Plan of Care (Signed)
  Problem: Education: Goal: Knowledge of General Education information will improve Description: Including pain rating scale, medication(s)/side effects and non-pharmacologic comfort measures Outcome: Progressing   Problem: Clinical Measurements: Goal: Will remain free from infection Outcome: Progressing Goal: Diagnostic test results will improve Outcome: Progressing   Problem: Elimination: Goal: Will not experience complications related to urinary retention Outcome: Progressing   

## 2021-07-02 DIAGNOSIS — N179 Acute kidney failure, unspecified: Secondary | ICD-10-CM | POA: Diagnosis not present

## 2021-07-02 LAB — CBC
HCT: 36.5 % — ABNORMAL LOW (ref 39.0–52.0)
Hemoglobin: 11.7 g/dL — ABNORMAL LOW (ref 13.0–17.0)
MCH: 31.9 pg (ref 26.0–34.0)
MCHC: 32.1 g/dL (ref 30.0–36.0)
MCV: 99.5 fL (ref 80.0–100.0)
Platelets: 167 10*3/uL (ref 150–400)
RBC: 3.67 MIL/uL — ABNORMAL LOW (ref 4.22–5.81)
RDW: 12.7 % (ref 11.5–15.5)
WBC: 11.2 10*3/uL — ABNORMAL HIGH (ref 4.0–10.5)
nRBC: 0 % (ref 0.0–0.2)

## 2021-07-02 LAB — COMPREHENSIVE METABOLIC PANEL
ALT: 20 U/L (ref 0–44)
AST: 294 U/L — ABNORMAL HIGH (ref 15–41)
Albumin: 2.2 g/dL — ABNORMAL LOW (ref 3.5–5.0)
Alkaline Phosphatase: 66 U/L (ref 38–126)
Anion gap: 15 (ref 5–15)
BUN: 116 mg/dL — ABNORMAL HIGH (ref 8–23)
CO2: 20 mmol/L — ABNORMAL LOW (ref 22–32)
Calcium: 7.1 mg/dL — ABNORMAL LOW (ref 8.9–10.3)
Chloride: 102 mmol/L (ref 98–111)
Creatinine, Ser: 8.17 mg/dL — ABNORMAL HIGH (ref 0.61–1.24)
GFR, Estimated: 6 mL/min — ABNORMAL LOW (ref >60)
Glucose, Bld: 86 mg/dL (ref 70–99)
Potassium: 5.4 mmol/L — ABNORMAL HIGH (ref 3.5–5.1)
Sodium: 137 mmol/L (ref 135–145)
Total Bilirubin: 1 mg/dL (ref 0.3–1.2)
Total Protein: 5.3 g/dL — ABNORMAL LOW (ref 6.5–8.1)

## 2021-07-02 LAB — MAGNESIUM: Magnesium: 2.3 mg/dL (ref 1.7–2.4)

## 2021-07-02 LAB — CK: Total CK: 24350 U/L — ABNORMAL HIGH (ref 49–397)

## 2021-07-02 MED ORDER — SODIUM ZIRCONIUM CYCLOSILICATE 10 G PO PACK
10.0000 g | PACK | Freq: Once | ORAL | Status: AC
  Administered 2021-07-02: 10 g via ORAL
  Filled 2021-07-02: qty 1

## 2021-07-02 NOTE — Progress Notes (Signed)
[facility] Progress Note    Assessment/ Plan:   AKI: secondary to rhabdo and hemodynamic insults from prolonged prerenal injury in the context of solitary kidney & lasix + HCTZ + lisinopril use. -Creatinine essentially stable slightly better today.  Patient may be turning the corner a little bit with increased urine output.  Previously not felt to be a dialysis candidate but sons did tell primary team yesterday that would be more interested in temporary dialysis if it was necessary.  Do not feel it is vital today given he may be improving.  Will readdress tomorrow -continue with isotonic fluids for now--monitor volume status closely (overall euvolemic today) -lasix, HCTZ, and lisinopril on hold -Continue to monitor daily Cr, Dose meds for GFR<15 -Monitor Daily strict I/Os, Daily weight  -Maintain MAP>65 for optimal renal perfusion.  -avoid further nephrotoxins including NSAIDS, Morphine.  Unless absolutely necessary, avoid CT with contrast and/or MRI with gadolinium.     Rhabdomyolysis -unknown down time at home, monitor CK, 1L LR bolus today followed by 125cc/hr. Monitor volume status closely -CK >50k 11/1. Improving   AMS -per primary. Possibly secondary to morphine in AKI + uremia. There is concern for early dementia as well per palliative.  Mental status acceptable today   Parkinson's -per primary, appreciate palliative care's assistance   Hypertension: -holding diuretics and ACEI. Monitor for now   Hyperkalemia -Reduce Lokelma 10 g today.  Hopefully will improve with improving kidney function   AGMA -on nahco3 1300mg  tid, overall improved   Bladder mass -unchanged in appearance since 2019, can be followed as an outpatient. Of note, he still smoking   Recommendations conveyed to primary service.    Subjective:   patient states he is doing well today.  Feels like he is urinating more more.  Kidney function slightly better today   Objective:   BP (!)  150/80 (BP Location: Left Arm)   Pulse 71   Temp 98.5 F (36.9 C) (Oral)   Resp 19   Ht 5\' 8"  (1.727 m)   Wt 81.2 kg   SpO2 100%   BMI 27.22 kg/m   Intake/Output Summary (Last 24 hours) at 07/02/2021 1104 Last data filed at 07/02/2021 8916 Gross per 24 hour  Intake 8406.9 ml  Output 500 ml  Net 7906.9 ml   Weight change:   Physical Exam: XIH:[id number] ill appearing, NAD CVS: Normal rate Resp: Bilateral chest rise with no increased work of breathing JZP:HXTA Ext:no sig edema Neuro: aaox2 (self, location), +tremors   Imaging: No results found.  Labs: BMET Recent Labs  Lab 06/27/21 1140 06/28/21 0447 06/28/21 1136 06/29/21 0451 06/30/21 0406 07/01/21 0431 07/02/21 0413  NA 139 139 139 141 139 138 137  K 4.5 5.3* 4.4 4.6 4.8 5.1 5.4*  CL 96* 102 101 107 105 104 102  CO2 25 21* 21* 18* 20* 20* 20*  GLUCOSE 87 111* 121* 86 83 107* 86  BUN 67* 84* 91* 99* 80* 118* 116*  CREATININE 4.61* 5.46* 5.72* 6.32* 7.31* 8.26* 8.17*  CALCIUM 8.7* 7.5* 7.6* 6.9* 7.0* 6.9* 7.1*   CBC Recent Labs  Lab 06/29/21 0451 06/30/21 0406 07/01/21 0431 07/02/21 0413  WBC 13.9* 12.0* 12.6* 11.2*  HGB 11.2* 11.3* 11.5* 11.7*  HCT 34.7* 34.5* 35.7* 36.5*  MCV 98.9 98.9 98.9 99.5  PLT 156 158 170 167    Medications:     aspirin EC  81 mg Oral Daily   carbidopa-levodopa  1 tablet Oral TID   gabapentin  300 mg Oral BID   heparin  5,000 Units Subcutaneous Q8H   nystatin   Topical BID   sodium bicarbonate  1,300 mg Oral TID   sodium zirconium cyclosilicate  10 g Oral Once      [facility] 07/02/2021, 11:04 AM

## 2021-07-02 NOTE — Progress Notes (Signed)
PROGRESS NOTE    Lucas [name]  WJX:[id number] DOB: 02-May-1946 DOA: 06/27/2021 PCP: Alfonse Flavors, MD   Brief Narrative:   Lucas [name] is a 75 y.o. male with medical history significant of Parkinson's disease, prior CVA, COPD, tobacco use, hypertension, chronic lower extremity edema, hyperlipidemia, normally lives at home.  Family had brought him to ED due to what appears to be a chronic decline in his overall condition with confusion.  Patient was admitted for acute metabolic encephalopathy and associated acute kidney injury that appears to be due to rhabdomyolysis.  Patient continues to require aggressive IV fluids per nephrology.  Of care consulted with ongoing goals of care discussion pending.  Patient and family now possibly desiring HD if needed.  Assessment & Plan:   Active Problems:   Parkinson's disease ([place])   Generalized weakness   Essential hypertension   Hyperlipidemia   COPD (chronic obstructive pulmonary disease) (HCC)   AKI (acute kidney injury) (HCC)   Acute metabolic encephalopathy   Bladder mass   Acute kidney injury secondary to rhabdomyolysis -Urine output appears nonoliguric, but Cr continues to increase; UO almost 2 liters -Also with decreased p.o. intake in the setting of ongoing diuretic use and lisinopril. -Imaging of the abdomen did not reveal any hydronephrosis -Appreciate nephrology recommendations with plans to continue aggressive IV fluid -Patient is not a candidate for dialysis and palliative consultation requested -IV fluids changed to LR per nephrology with sodium bicarbonate tablets added for AGMA -Continue aggressive IV fluid and repeat monitoring in a.m. -Potassium trending up, will give Lokelma today and follow   Transaminitis 2/2 rhabdomyolysis -Continue to monitor -Improving CK levels noted   Acute metabolic encephalopathy-improving -I suspect this is related to renal failure along with recent morphine use in the setting of  AKI -ABG, TSH, and ammonia within normal limits -Continue to monitor for improvement pending further improvement in renal function -Holding sedating meds   Hypertension -Holding antihypertensives now since he was likely hypovolemic -Can consider resuming once volume issues have stabilized   Parkinson's disease -Continue on Sinemet   Hyperlipidemia -Continue statin   Bladder mass -Appears to have been present on CT imaging from 2019 and appears unchanged -This can be followed outpatient by urology   COPD -No wheezing or shortness of breath at this time -Continue as needed bronchodilators   Generalized weakness -Physical therapy evaluation -May need skilled nursing facility placement if agreeable   Goals of care -Discussed with patient's son who reports that currently patient is full code -His son also did mention that patient is being followed by hospice, and may have an aide to help with his bathing -He did stress the patient is currently not on comfort care -We will need further information from participating hospice.     DVT prophylaxis: Heparin Code Status: DNI Family Communication: Discussed with sons Audry Pili and Ronalee Belts on phone 11/5 Disposition Plan:  Status is: Inpatient   Remains inpatient appropriate because: Need for IV fluid.     Skin Assessment:   I have examined the patient's skin and I agree with the wound assessment as performed by the wound care RN as outlined below:   Pressure Injury 03/04/21 Sacrum Mid Stage 2 -  Partial thickness loss of dermis presenting as a shallow open injury with a red, pink wound bed without slough. (Active)  03/04/21 2245  Location: Sacrum  Location Orientation: Mid  Staging: Stage 2 -  Partial thickness loss of dermis presenting as a shallow open injury  with a red, pink wound bed without slough.  Wound Description (Comments):   Present on Admission: Yes      Consultants:  Nephrology Palliative care   Procedures:  See  below   Antimicrobials:  None  Subjective: Patient seen and evaluated today with no new acute complaints or concerns. No acute concerns or events noted overnight. Appears somewhat confused.  Objective: Vitals:   07/01/21 0458 07/01/21 1420 07/01/21 2055 07/02/21 0505  BP: (!) 150/48 (!) 150/55 (!) 180/61 (!) 150/80  Pulse: 67 69 74 71  Resp: 19 18 19 19   Temp: 98.4 F (36.9 C) 98.3 F (36.8 C) 98.4 F (36.9 C) 98.5 F (36.9 C)  TempSrc: Oral Oral Oral Oral  SpO2: 95% 95% 96% 100%  Weight:      Height:        Intake/Output Summary (Last 24 hours) at 07/02/2021 1028 Last data filed at 07/02/2021 0928 Gross per 24 hour  Intake 8406.9 ml  Output 500 ml  Net 7906.9 ml   Filed Weights   06/27/21 1109 06/27/21 1546  Weight: 90.7 kg 81.2 kg    Examination:  General exam: Appears calm and comfortable, mildly confused Respiratory system: Clear to auscultation. Respiratory effort normal. Cardiovascular system: S1 & S2 heard, RRR.  Gastrointestinal system: Abdomen is soft Central nervous system: Alert and awake Extremities: No edema Skin: No significant lesions noted Psychiatry: Flat affect.    Data Reviewed: I have personally reviewed following labs and imaging studies  CBC: Recent Labs  Lab 06/28/21 0447 06/29/21 0451 06/30/21 0406 07/01/21 0431 07/02/21 0413  WBC 13.5* 13.9* 12.0* 12.6* 11.2*  HGB 13.4 11.2* 11.3* 11.5* 11.7*  HCT 40.6 34.7* 34.5* 35.7* 36.5*  MCV 97.1 98.9 98.9 98.9 99.5  PLT 202 156 158 170 601   Basic Metabolic Panel: Recent Labs  Lab 06/28/21 1136 06/29/21 0451 06/30/21 0406 07/01/21 0431 07/02/21 0413  NA 139 141 139 138 137  K 4.4 4.6 4.8 5.1 5.4*  CL 101 107 105 104 102  CO2 21* 18* 20* 20* 20*  GLUCOSE 121* 86 83 107* 86  BUN 91* 99* 80* 118* 116*  CREATININE 5.72* 6.32* 7.31* 8.26* 8.17*  CALCIUM 7.6* 6.9* 7.0* 6.9* 7.1*  MG  --  2.4 2.2 2.2 2.3   GFR: Estimated Creatinine Clearance: 7.8 mL/min (A) (by C-G formula  based on SCr of 8.17 mg/dL (H)). Liver Function Tests: Recent Labs  Lab 06/28/21 0447 06/29/21 0451 06/30/21 0406 07/01/21 0431 07/02/21 0413  AST 711* 648* 538* 386* 294*  ALT 20 17 17 16 20   ALKPHOS 61 55 56 55 66  BILITOT 1.7* 1.0 1.2 0.8 1.0  PROT 6.6 5.5* 5.3* 5.3* 5.3*  ALBUMIN 3.3* 2.4* 2.2* 2.2* 2.2*   Recent Labs  Lab 06/27/21 1140  LIPASE 22   Recent Labs  Lab 06/27/21 1845  AMMONIA 26   Coagulation Profile: No results for input(s): INR, PROTIME in the last 168 hours. Cardiac Enzymes: Recent Labs  Lab 06/28/21 0447 06/29/21 0451 06/30/21 0406 07/01/21 0431 07/02/21 0413  CKTOTAL >50,000* 44,762* 34,005* 30,202* 24,350*   BNP (last 3 results) No results for input(s): PROBNP in the last 8760 hours. HbA1C: No results for input(s): HGBA1C in the last 72 hours. CBG: No results for input(s): GLUCAP in the last 168 hours. Lipid Profile: No results for input(s): CHOL, HDL, LDLCALC, TRIG, CHOLHDL, LDLDIRECT in the last 72 hours. Thyroid Function Tests: No results for input(s): TSH, T4TOTAL, FREET4, T3FREE, THYROIDAB in the last 72  hours. Anemia Panel: No results for input(s): VITAMINB12, FOLATE, FERRITIN, TIBC, IRON, RETICCTPCT in the last 72 hours. Sepsis Labs: No results for input(s): PROCALCITON, LATICACIDVEN in the last 168 hours.  Recent Results (from the past 240 hour(s))  Resp Panel by RT-PCR (Flu A&B, Covid) Nasopharyngeal Swab     Status: None   Collection Time: 06/27/21 12:07 PM   Specimen: Nasopharyngeal Swab; Nasopharyngeal(NP) swabs in vial transport medium  Result Value Ref Range Status   SARS Coronavirus 2 by RT PCR NEGATIVE NEGATIVE Final    Comment: (NOTE) SARS-CoV-2 target nucleic acids are NOT DETECTED.  The SARS-CoV-2 RNA is generally detectable in upper respiratory specimens during the acute phase of infection. The lowest concentration of SARS-CoV-2 viral copies this assay can detect is 138 copies/mL. A negative result does not  preclude SARS-Cov-2 infection and should not be used as the sole basis for treatment or other patient management decisions. A negative result may occur with  improper specimen collection/handling, submission of specimen other than nasopharyngeal swab, presence of viral mutation(s) within the areas targeted by this assay, and inadequate number of viral copies(<138 copies/mL). A negative result must be combined with clinical observations, patient history, and epidemiological information. The expected result is Negative.  Fact Sheet for Patients:  EntrepreneurPulse.com.au  Fact Sheet for Healthcare Providers:  IncredibleEmployment.be  This test is no t yet approved or cleared by the [place] FDA and  has been authorized for detection and/or diagnosis of SARS-CoV-2 by FDA under an Emergency Use Authorization (EUA). This EUA will remain  in effect (meaning this test can be used) for the duration of the COVID-19 declaration under Section 564(b)(1) of the Act, 21 U.S.C.section 360bbb-3(b)(1), unless the authorization is terminated  or revoked sooner.       Influenza A by PCR NEGATIVE NEGATIVE Final   Influenza B by PCR NEGATIVE NEGATIVE Final    Comment: (NOTE) The Xpert Xpress SARS-CoV-2/FLU/RSV plus assay is intended as an aid in the diagnosis of influenza from Nasopharyngeal swab specimens and should not be used as a sole basis for treatment. Nasal washings and aspirates are unacceptable for Xpert Xpress SARS-CoV-2/FLU/RSV testing.  Fact Sheet for Patients: EntrepreneurPulse.com.au  Fact Sheet for Healthcare Providers: IncredibleEmployment.be  This test is not yet approved or cleared by the [place] FDA and has been authorized for detection and/or diagnosis of SARS-CoV-2 by FDA under an Emergency Use Authorization (EUA). This EUA will remain in effect (meaning this test can be used) for the  duration of the COVID-19 declaration under Section 564(b)(1) of the Act, 21 U.S.C. section 360bbb-3(b)(1), unless the authorization is terminated or revoked.  Performed at [facility], [address], [place], [place] [address]        Radiology Studies: No results found.    Scheduled Meds:  aspirin EC  81 mg Oral Daily   carbidopa-levodopa  1 tablet Oral TID   gabapentin  300 mg Oral BID   heparin  5,000 Units Subcutaneous Q8H   nystatin   Topical BID   sodium bicarbonate  1,300 mg Oral TID   sodium zirconium cyclosilicate  10 g Oral Once   Continuous Infusions:  lactated ringers 125 mL/hr at 07/02/21 0928     LOS: 4 days    Time spent: 35 minutes    Thailan Sava Darleen Crocker, DO [facility]  If 7PM-7AM, please contact night-coverage [email or web address] 07/02/2021, 10:28 AM

## 2021-07-03 ENCOUNTER — Inpatient Hospital Stay (HOSPITAL_COMMUNITY): Payer: Medicare Other

## 2021-07-03 DIAGNOSIS — Z515 Encounter for palliative care: Secondary | ICD-10-CM | POA: Diagnosis not present

## 2021-07-03 DIAGNOSIS — N179 Acute kidney failure, unspecified: Secondary | ICD-10-CM | POA: Diagnosis not present

## 2021-07-03 DIAGNOSIS — G20 Parkinson's disease: Secondary | ICD-10-CM | POA: Diagnosis not present

## 2021-07-03 DIAGNOSIS — Z7189 Other specified counseling: Secondary | ICD-10-CM | POA: Diagnosis not present

## 2021-07-03 LAB — COMPREHENSIVE METABOLIC PANEL
ALT: 12 U/L (ref 0–44)
AST: 142 U/L — ABNORMAL HIGH (ref 15–41)
Albumin: 1.8 g/dL — ABNORMAL LOW (ref 3.5–5.0)
Alkaline Phosphatase: 80 U/L (ref 38–126)
Anion gap: 13 (ref 5–15)
BUN: 108 mg/dL — ABNORMAL HIGH (ref 8–23)
CO2: 22 mmol/L (ref 22–32)
Calcium: 6.6 mg/dL — ABNORMAL LOW (ref 8.9–10.3)
Chloride: 102 mmol/L (ref 98–111)
Creatinine, Ser: 8.4 mg/dL — ABNORMAL HIGH (ref 0.61–1.24)
GFR, Estimated: 6 mL/min — ABNORMAL LOW (ref >60)
Glucose, Bld: 86 mg/dL (ref 70–99)
Potassium: 4.8 mmol/L (ref 3.5–5.1)
Sodium: 137 mmol/L (ref 135–145)
Total Bilirubin: 0.8 mg/dL (ref 0.3–1.2)
Total Protein: 4.6 g/dL — ABNORMAL LOW (ref 6.5–8.1)

## 2021-07-03 LAB — MAGNESIUM: Magnesium: 2.1 mg/dL (ref 1.7–2.4)

## 2021-07-03 LAB — CK: Total CK: 13734 U/L — ABNORMAL HIGH (ref 49–397)

## 2021-07-03 MED ORDER — SODIUM ZIRCONIUM CYCLOSILICATE 10 G PO PACK
10.0000 g | PACK | Freq: Once | ORAL | Status: AC
  Administered 2021-07-03: 10 g via ORAL
  Filled 2021-07-03: qty 1

## 2021-07-03 MED ORDER — CHLORHEXIDINE GLUCONATE CLOTH 2 % EX PADS
6.0000 | MEDICATED_PAD | Freq: Every day | CUTANEOUS | Status: DC
  Administered 2021-07-03 – 2021-07-21 (×18): 6 via TOPICAL

## 2021-07-03 NOTE — Progress Notes (Signed)
Palliative: Lucas [name] Lucas [name], Lucas [name], is resting quietly in bed.  He appears acutely/chronically ill and frail.  He will briefly make but not keep eye contact and keeps his eyes closed most of my visit.  He is experiencing periods of confusion, but is oriented to self and situation.  I believe that he is able to make his basic needs known.  His sister, Lucas [name] Lucas [name], is at bedside.  We talked about Lucas [name] Lucas [name]'s acute health concerns including, but not limited to creatinine levels, improving CK, Foley catheter, hemodialysis not offered by 3 nephrologist, time for outcomes.    Call to son, Lucas [name] Lucas [name].  We talked about kidney function, worsening creatinine, improving CK, HD not being offered as [name] is not a candidate for long-term HD.  We talked about CODE STATUS.  Lucas [name] Lucas [name] states that he had this discussion with his father and they have elected DNR status orders updated.  Conference with attending, nephrology, bedside nursing staff, transition of care team related to patient condition, needs, goals of care.  Plan: Family elects DNR status, indwelling Foley catheter placed, time for outcomes.  22 minutes  Lucas Axe, NP Palliative medicine team Team phone [phone] Greater than 50% of this time was spent counseling and coordinating care related to the above assessment and plan.

## 2021-07-03 NOTE — Progress Notes (Signed)
Patient ID: Lucas [name], male   DOB: [birth year], 75 y.o.   MRN: [id number] S: Somnolent but arouseable and without new complaints. O:BP (!) 137/58 (BP Location: Left Arm)   Pulse 73   Temp 98.9 F (37.2 C) (Oral)   Resp 20   Ht 5\' 8"  (1.727 m)   Wt 81.2 kg   SpO2 100%   BMI 27.22 kg/m   Intake/Output Summary (Last 24 hours) at 07/03/2021 0907 Last data filed at 07/03/2021 0500 Gross per 24 hour  Intake 10614.76 ml  Output 200 ml  Net 10414.76 ml   Intake/Output: I/O last 3 completed shifts: In: 11094.8 [P.O.:1260; I.V.:9834.8] Out: 600 [Urine:600]  Intake/Output this shift:  No intake/output data recorded. Weight change:  Gen:NAD, lying flat in bed, somnolent but verbal CVS:RRR no rub Resp: occ rhonchi DGL:OVFIEPPIR, tense, + suprapubic fullness, +BS, nontender  Ext:1+ pretibial edema, erythema  Recent Labs  Lab 06/27/21 1140 06/28/21 0447 06/28/21 1136 06/29/21 0451 06/30/21 0406 07/01/21 0431 07/02/21 0413 07/03/21 0339  NA 139 139 139 141 139 138 137 137  K 4.5 5.3* 4.4 4.6 4.8 5.1 5.4* 4.8  CL 96* 102 101 107 105 104 102 102  CO2 25 21* 21* 18* 20* 20* 20* 22  GLUCOSE 87 111* 121* 86 83 107* 86 86  BUN 67* 84* 91* 99* 80* 118* 116* 108*  CREATININE 4.61* 5.46* 5.72* 6.32* 7.31* 8.26* 8.17* 8.40*  ALBUMIN 4.4 3.3*  --  2.4* 2.2* 2.2* 2.2* 1.8*  CALCIUM 8.7* 7.5* 7.6* 6.9* 7.0* 6.9* 7.1* 6.6*  AST 207* 711*  --  648* 538* 386* 294* 142*  ALT 31 20  --  17 17 16 20 12    Liver Function Tests: Recent Labs  Lab 07/01/21 0431 07/02/21 0413 07/03/21 0339  AST 386* 294* 142*  ALT 16 20 12   ALKPHOS 55 66 80  BILITOT 0.8 1.0 0.8  PROT 5.3* 5.3* 4.6*  ALBUMIN 2.2* 2.2* 1.8*   Recent Labs  Lab 06/27/21 1140  LIPASE 22   Recent Labs  Lab 06/27/21 1845  AMMONIA 26   CBC: Recent Labs  Lab 06/28/21 0447 06/29/21 0451 06/30/21 0406 07/01/21 0431 07/02/21 0413  WBC 13.5* 13.9* 12.0* 12.6* 11.2*  HGB 13.4 11.2* 11.3* 11.5* 11.7*  HCT 40.6 34.7*  34.5* 35.7* 36.5*  MCV 97.1 98.9 98.9 98.9 99.5  PLT 202 156 158 170 167   Cardiac Enzymes: Recent Labs  Lab 06/29/21 0451 06/30/21 0406 07/01/21 0431 07/02/21 0413 07/03/21 0339  CKTOTAL 44,762* 34,005* 30,202* 24,350* 13,734*   CBG: No results for input(s): GLUCAP in the last 168 hours.  Iron Studies: No results for input(s): IRON, TIBC, TRANSFERRIN, FERRITIN in the last 72 hours. Studies/Results: No results found.  aspirin EC  81 mg Oral Daily   carbidopa-levodopa  1 tablet Oral TID   gabapentin  300 mg Oral BID   heparin  5,000 Units Subcutaneous Q8H   nystatin   Topical BID   sodium bicarbonate  1,300 mg Oral TID    BMET    Component Value Date/Time   NA 137 07/03/2021 0339   K 4.8 07/03/2021 0339   CL 102 07/03/2021 0339   CO2 22 07/03/2021 0339   GLUCOSE 86 07/03/2021 0339   BUN 108 (H) 07/03/2021 0339   CREATININE 8.40 (H) 07/03/2021 0339   CREATININE 0.96 06/24/2017 1155   CALCIUM 6.6 (L) 07/03/2021 0339   GFRNONAA 6 (L) 07/03/2021 0339   GFRNONAA 80 06/24/2017 1155   GFRAA 59 (  L) 05/09/2020 2123   GFRAA 93 06/24/2017 1155   CBC    Component Value Date/Time   WBC 11.2 (H) 07/02/2021 0413   RBC 3.67 (L) 07/02/2021 0413   HGB 11.7 (L) 07/02/2021 0413   HCT 36.5 (L) 07/02/2021 0413   PLT 167 07/02/2021 0413   MCV 99.5 07/02/2021 0413   MCH 31.9 07/02/2021 0413   MCHC 32.1 07/02/2021 0413   RDW 12.7 07/02/2021 0413   LYMPHSABS 2.2 06/09/2021 0021   MONOABS 0.6 06/09/2021 0021   EOSABS 0.2 06/09/2021 0021   BASOSABS 0.1 06/09/2021 0021     Assessment/Plan:  AKI/CKD stage IIIa - multifactorial in setting of rhabdomyolysis, prolonged re-renal injury in setting of solitary kidney as well as lasix, HCTZ, and ACE-inhibition.  BUN/Cr up slightly but CK levels falling.  Will order foley catheter as he likely has some urinary retention from his Parkinson's and we need better I's/O's.  He is not a suitable candidate for dialysis given his poor functional  and nutritional status as well as his multiple co-morbidities.  Recommend conservative management for now and appreciate Palliative care's assistance. Rhabdomyolysis - unknown time down at home.  CK levels improving with IVF's.  Will decrease rate to 100 ml/hr of LR.  Foley placement to follow I's/o's.  AMS - likely combination of meds and AKI. Parkinson's - apparently poor functional status and is incontinent at home.  Palliative care following. HTN - off ace and diuretics for now. BP stable. Hyperkalemia - improved with lokelma.  Continue to follow. AGMA - on bicarb and LR. Bladder mass- consider urology evaluation if unable to pass foley catheter.  Donetta Potts, MD [employer] [phone]

## 2021-07-03 NOTE — Progress Notes (Signed)
PROGRESS NOTE    Lucas [name]  AJO:[id number] DOB: 1945/10/08 DOA: 06/27/2021 PCP: Alfonse Flavors, MD   Brief Narrative:   Lucas [name] is a 75 y.o. male with medical history significant of Parkinson's disease, prior CVA, COPD, tobacco use, hypertension, chronic lower extremity edema, hyperlipidemia, normally lives at home.  Family had brought him to ED due to what appears to be a chronic decline in his overall condition with confusion.  Patient was admitted for acute metabolic encephalopathy and associated acute kidney injury that appears to be due to rhabdomyolysis.  Patient continues to require aggressive IV fluids per nephrology.  Of care consulted with ongoing goals of care discussion pending.  Patient and family now possibly desiring HD if needed.  Assessment & Plan:   Active Problems:   Parkinson's disease ([place])   Generalized weakness   Essential hypertension   Hyperlipidemia   COPD (chronic obstructive pulmonary disease) (HCC)   AKI (acute kidney injury) (HCC)   Acute metabolic encephalopathy   Bladder mass   Acute kidney injury secondary to rhabdomyolysis -Urine output appears nonoliguric, but Cr continues to increase; UO almost 2 liters -Also with decreased p.o. intake in the setting of ongoing diuretic use and lisinopril. -Imaging of the abdomen did not reveal any hydronephrosis -Appreciate nephrology recommendations with plans to continue aggressive IV fluid -Patient is not a candidate for dialysis and palliative consultation requested -IV fluids changed to LR per nephrology with sodium bicarbonate tablets added for AGMA -Continue aggressive IV fluid with decreased rate and repeat monitoring in a.m. -Foley per Nephrology to monitor UO   Transaminitis 2/2 rhabdomyolysis -Continue to monitor -Improving CK levels noted   Acute metabolic encephalopathy -I suspect this is related to renal failure along with recent morphine use in the setting of AKI -ABG,  TSH, and ammonia within normal limits -Continue to monitor for improvement pending further improvement in renal function -Holding sedating meds   Hypertension -Holding antihypertensives now since he was likely hypovolemic -Can consider resuming once volume issues have stabilized   Parkinson's disease -Continue on Sinemet   Hyperlipidemia -Continue statin   Bladder mass -Appears to have been present on CT imaging from 2019 and appears unchanged -This can be followed outpatient by urology   COPD -No wheezing or shortness of breath at this time -Continue as needed bronchodilators   Generalized weakness -Physical therapy evaluation -May need skilled nursing facility placement if agreeable   Goals of care -Discussed with patient's son who reports that currently patient is full code -His son also did mention that patient is being followed by hospice, and may have an aide to help with his bathing -He did stress the patient is currently not on comfort care -We will need further information from participating hospice.     DVT prophylaxis: Heparin Code Status: DNI Family Communication: Tried calling with no response 11/7 Disposition Plan:  Status is: Inpatient   Remains inpatient appropriate because: Need for IV fluid.     Skin Assessment:   I have examined the patient's skin and I agree with the wound assessment as performed by the wound care RN as outlined below:   Pressure Injury 03/04/21 Sacrum Mid Stage 2 -  Partial thickness loss of dermis presenting as a shallow open injury with a red, pink wound bed without slough. (Active)  03/04/21 2245  Location: Sacrum  Location Orientation: Mid  Staging: Stage 2 -  Partial thickness loss of dermis presenting as a shallow open injury with a red,  pink wound bed without slough.  Wound Description (Comments):   Present on Admission: Yes      Consultants:  Nephrology Palliative care   Procedures:  See below    Antimicrobials:  None   Subjective: Patient seen and evaluated today and is noted to be somnolent. No acute overnight events noted.  Objective: Vitals:   07/02/21 1312 07/02/21 2001 07/02/21 2121 07/03/21 0549  BP: (!) 162/69  (!) 154/66 (!) 137/58  Pulse: 68  93 73  Resp: 16  19 20   Temp: 98.6 F (37 C)  99.5 F (37.5 C) 98.9 F (37.2 C)  TempSrc: Oral  Oral Oral  SpO2: 100% 99% 90% 100%  Weight:      Height:        Intake/Output Summary (Last 24 hours) at 07/03/2021 0928 Last data filed at 07/03/2021 0500 Gross per 24 hour  Intake 2687.86 ml  Output 200 ml  Net 2487.86 ml   Filed Weights   06/27/21 1109 06/27/21 1546  Weight: 90.7 kg 81.2 kg    Examination:  General exam: Appears calm and comfortable  Respiratory system: Clear to auscultation. Respiratory effort normal. Cardiovascular system: S1 & S2 heard, RRR.  Gastrointestinal system: Abdomen is soft Central nervous system: Somnolent Extremities: No edema Skin: No significant lesions noted Psychiatry: Flat affect.    Data Reviewed: I have personally reviewed following labs and imaging studies  CBC: Recent Labs  Lab 06/28/21 0447 06/29/21 0451 06/30/21 0406 07/01/21 0431 07/02/21 0413  WBC 13.5* 13.9* 12.0* 12.6* 11.2*  HGB 13.4 11.2* 11.3* 11.5* 11.7*  HCT 40.6 34.7* 34.5* 35.7* 36.5*  MCV 97.1 98.9 98.9 98.9 99.5  PLT 202 156 158 170 235   Basic Metabolic Panel: Recent Labs  Lab 06/29/21 0451 06/30/21 0406 07/01/21 0431 07/02/21 0413 07/03/21 0339  NA 141 139 138 137 137  K 4.6 4.8 5.1 5.4* 4.8  CL 107 105 104 102 102  CO2 18* 20* 20* 20* 22  GLUCOSE 86 83 107* 86 86  BUN 99* 80* 118* 116* 108*  CREATININE 6.32* 7.31* 8.26* 8.17* 8.40*  CALCIUM 6.9* 7.0* 6.9* 7.1* 6.6*  MG 2.4 2.2 2.2 2.3 2.1   GFR: Estimated Creatinine Clearance: 7.6 mL/min (A) (by C-G formula based on SCr of 8.4 mg/dL (H)). Liver Function Tests: Recent Labs  Lab 06/29/21 0451 06/30/21 0406 07/01/21 0431  07/02/21 0413 07/03/21 0339  AST 648* 538* 386* 294* 142*  ALT 17 17 16 20 12   ALKPHOS 55 56 55 66 80  BILITOT 1.0 1.2 0.8 1.0 0.8  PROT 5.5* 5.3* 5.3* 5.3* 4.6*  ALBUMIN 2.4* 2.2* 2.2* 2.2* 1.8*   Recent Labs  Lab 06/27/21 1140  LIPASE 22   Recent Labs  Lab 06/27/21 1845  AMMONIA 26   Coagulation Profile: No results for input(s): INR, PROTIME in the last 168 hours. Cardiac Enzymes: Recent Labs  Lab 06/29/21 0451 06/30/21 0406 07/01/21 0431 07/02/21 0413 07/03/21 0339  CKTOTAL 44,762* 34,005* 30,202* 24,350* 13,734*   BNP (last 3 results) No results for input(s): PROBNP in the last 8760 hours. HbA1C: No results for input(s): HGBA1C in the last 72 hours. CBG: No results for input(s): GLUCAP in the last 168 hours. Lipid Profile: No results for input(s): CHOL, HDL, LDLCALC, TRIG, CHOLHDL, LDLDIRECT in the last 72 hours. Thyroid Function Tests: No results for input(s): TSH, T4TOTAL, FREET4, T3FREE, THYROIDAB in the last 72 hours. Anemia Panel: No results for input(s): VITAMINB12, FOLATE, FERRITIN, TIBC, IRON, RETICCTPCT in the last 72  hours. Sepsis Labs: No results for input(s): PROCALCITON, LATICACIDVEN in the last 168 hours.  Recent Results (from the past 240 hour(s))  Resp Panel by RT-PCR (Flu A&B, Covid) Nasopharyngeal Swab     Status: None   Collection Time: 06/27/21 12:07 PM   Specimen: Nasopharyngeal Swab; Nasopharyngeal(NP) swabs in vial transport medium  Result Value Ref Range Status   SARS Coronavirus 2 by RT PCR NEGATIVE NEGATIVE Final    Comment: (NOTE) SARS-CoV-2 target nucleic acids are NOT DETECTED.  The SARS-CoV-2 RNA is generally detectable in upper respiratory specimens during the acute phase of infection. The lowest concentration of SARS-CoV-2 viral copies this assay can detect is 138 copies/mL. A negative result does not preclude SARS-Cov-2 infection and should not be used as the sole basis for treatment or other patient management  decisions. A negative result may occur with  improper specimen collection/handling, submission of specimen other than nasopharyngeal swab, presence of viral mutation(s) within the areas targeted by this assay, and inadequate number of viral copies(<138 copies/mL). A negative result must be combined with clinical observations, patient history, and epidemiological information. The expected result is Negative.  Fact Sheet for Patients:  EntrepreneurPulse.com.au  Fact Sheet for Healthcare Providers:  IncredibleEmployment.be  This test is no t yet approved or cleared by the [place] FDA and  has been authorized for detection and/or diagnosis of SARS-CoV-2 by FDA under an Emergency Use Authorization (EUA). This EUA will remain  in effect (meaning this test can be used) for the duration of the COVID-19 declaration under Section 564(b)(1) of the Act, 21 U.S.C.section 360bbb-3(b)(1), unless the authorization is terminated  or revoked sooner.       Influenza A by PCR NEGATIVE NEGATIVE Final   Influenza B by PCR NEGATIVE NEGATIVE Final    Comment: (NOTE) The Xpert Xpress SARS-CoV-2/FLU/RSV plus assay is intended as an aid in the diagnosis of influenza from Nasopharyngeal swab specimens and should not be used as a sole basis for treatment. Nasal washings and aspirates are unacceptable for Xpert Xpress SARS-CoV-2/FLU/RSV testing.  Fact Sheet for Patients: EntrepreneurPulse.com.au  Fact Sheet for Healthcare Providers: IncredibleEmployment.be  This test is not yet approved or cleared by the [place] FDA and has been authorized for detection and/or diagnosis of SARS-CoV-2 by FDA under an Emergency Use Authorization (EUA). This EUA will remain in effect (meaning this test can be used) for the duration of the COVID-19 declaration under Section 564(b)(1) of the Act, 21 U.S.C. section 360bbb-3(b)(1), unless the  authorization is terminated or revoked.  Performed at [facility], [address], [place], [place] [address]          Radiology Studies: No results found.      Scheduled Meds:  aspirin EC  81 mg Oral Daily   carbidopa-levodopa  1 tablet Oral TID   gabapentin  300 mg Oral BID   heparin  5,000 Units Subcutaneous Q8H   nystatin   Topical BID   sodium bicarbonate  1,300 mg Oral TID   sodium zirconium cyclosilicate  10 g Oral Once   Continuous Infusions:  lactated ringers 125 mL/hr at 07/03/21 0552     LOS: 5 days    Time spent: 35 minutes    Tyrell Brereton Darleen Crocker, DO [facility]  If 7PM-7AM, please contact night-coverage [email or web address] 07/03/2021, 9:28 AM

## 2021-07-04 DIAGNOSIS — Z7189 Other specified counseling: Secondary | ICD-10-CM

## 2021-07-04 DIAGNOSIS — G20 Parkinson's disease: Secondary | ICD-10-CM

## 2021-07-04 DIAGNOSIS — N179 Acute kidney failure, unspecified: Principal | ICD-10-CM

## 2021-07-04 DIAGNOSIS — Z515 Encounter for palliative care: Secondary | ICD-10-CM

## 2021-07-04 LAB — COMPREHENSIVE METABOLIC PANEL
ALT: 13 U/L (ref 0–44)
AST: 96 U/L — ABNORMAL HIGH (ref 15–41)
Albumin: 2 g/dL — ABNORMAL LOW (ref 3.5–5.0)
Alkaline Phosphatase: 87 U/L (ref 38–126)
Anion gap: 12 (ref 5–15)
BUN: 124 mg/dL — ABNORMAL HIGH (ref 8–23)
CO2: 24 mmol/L (ref 22–32)
Calcium: 7 mg/dL — ABNORMAL LOW (ref 8.9–10.3)
Chloride: 101 mmol/L (ref 98–111)
Creatinine, Ser: 7.97 mg/dL — ABNORMAL HIGH (ref 0.61–1.24)
GFR, Estimated: 7 mL/min — ABNORMAL LOW (ref >60)
Glucose, Bld: 99 mg/dL (ref 70–99)
Potassium: 4.8 mmol/L (ref 3.5–5.1)
Sodium: 137 mmol/L (ref 135–145)
Total Bilirubin: 0.8 mg/dL (ref 0.3–1.2)
Total Protein: 5.1 g/dL — ABNORMAL LOW (ref 6.5–8.1)

## 2021-07-04 LAB — MAGNESIUM: Magnesium: 2.1 mg/dL (ref 1.7–2.4)

## 2021-07-04 LAB — CK: Total CK: 10131 U/L — ABNORMAL HIGH (ref 49–397)

## 2021-07-04 MED ORDER — ALBUMIN HUMAN 25 % IV SOLN
12.5000 g | Freq: Once | INTRAVENOUS | Status: AC
  Administered 2021-07-04: 12.5 g via INTRAVENOUS
  Filled 2021-07-04: qty 50

## 2021-07-04 NOTE — Progress Notes (Signed)
Palliative:  Chart reviewed.  Conference with nephrology.  Lucas [name]'s creatinine and CK are continuing to improve.  Palliative to follow.   Conference with attending, bedside nursing staff and TOC related to patient condition, needs, GOC and disposition.   Plan:  Continue to treat the treatable, but no CPR or intubation.  Anticipate need for short term rehab.  Family is requesting [place] facility if qualified and bed available.   No charge  Lucas Axe, NP Palliative Medicine Team  Team phone [phone] Greater than 50% of this time was spent counseling and coordinating care related to the above assessment and plan.

## 2021-07-04 NOTE — Evaluation (Addendum)
Physical Therapy Evaluation Patient Details Name: Lucas [name] MRN: [id number] DOB: 09-25-1945 Today's Date: 07/04/2021  History of Present Illness  Lucas [name] is a 75 y.o. male presents with chronic decline in his overall condition with confusion. Pt admitted for acute metabolic encephalopathy and associated acute kidney injury that appears to be due to rhabdomyolysis. PMH: Parkinson's disease, prior CVA, COPD, tobacco use, HTN, chronic lower extremity edema, hyperlipidemia   Clinical Impression  Pt admitted with above diagnosis. Per chart review, pt from home alone, mobilizing around the home in w/c and transferring into w/c without assist, had family checking on him, recent decline to the point pt unable to get OOB. Pr currently requires total +2 A with rolling supine to R and L, 0/5 ankle hip and knee strength and minimal BUE AROM to assist. Pt denies pain with mobility, limited PROM and noted wound to L great toe near lateral met head. Pt does follow commands, requires increased time and multimodal cues with occasional repeating. Pt on caseload for trial of acute PT to assess tolerance to activity; will continue to assess LTC vs SNF at d/c pending progress. Pt currently with functional limitations due to the deficits listed below (see PT Problem List). Pt will benefit from skilled PT to increase their independence and safety with mobility to allow discharge to the venue listed below.          Recommendations for follow up therapy are one component of a multi-disciplinary discharge planning process, led by the attending physician.  Recommendations may be updated based on patient status, additional functional criteria and insurance authorization.  Follow Up Recommendations Other (comment) (SNF vs LTC pending tolerance to activity)    Assistance Recommended at Discharge None  Functional Status Assessment Patient has had a recent decline in their functional status and/or demonstrates  limited ability to make significant improvements in function in a reasonable and predictable amount of time  Equipment Recommendations  None recommended by PT    Recommendations for Other Services       Precautions / Restrictions Precautions Precautions: Fall Precaution Comments: wounds Restrictions Weight Bearing Restrictions: No      Mobility  Bed Mobility Overal bed mobility: Needs Assistance Bed Mobility: Rolling Rolling: Total assist;+2 for physical assistance  General bed mobility comments: total A rolling R and L, limited UE AROM and inability to grip bedrail, multimodal cues for sequencing    Transfers   Ambulation/Gait   Stairs   Wheelchair Mobility    Modified Rankin (Stroke Patients Only)       Balance        Pertinent Vitals/Pain Pain Assessment: No/denies pain Faces Pain Scale: No hurt    Home Living Family/patient expects to be discharged to:: Private residence Living Arrangements: Alone  Additional Comments: Per chart review, pt lives alone and family checks on him throughout the day.    Prior Function Prior Level of Function : Patient poor historian/Family not available  Mobility Comments: per chart review, states pt transferring self to w/c and mobilizing around home in w/c but hasn't for a few days due to weakness       Hand Dominance        Extremity/Trunk Assessment   Upper Extremity Assessment Upper Extremity Assessment: Defer to OT evaluation    Lower Extremity Assessment Lower Extremity Assessment: Generalized weakness;RLE deficits/detail;LLE deficits/detail RLE Deficits / Details: trace 1/5 toe mobility, 0/5 ankle knee and hip, PROM ~25% LLE Deficits / Details: trace 1/5 toe mobility, 0/5 ankle  knee and hip, PROM ~25%, noted wound to lateral 1st met head- RN notified       Communication   Communication: No difficulties (minimally conversational)  Cognition Arousal/Alertness: Lethargic Behavior During Therapy: Flat  affect Overall Cognitive Status: No family/caregiver present to determine baseline cognitive functioning  General Comments: Pt minimally opens eyes, states name, DOB and age appropriately, states current president appropriately and being in hospital. Minimally conversational, difficulty maintaining arousal, requires max multimodal cues to follow one step commands and occasional repeating of commands        General Comments      Exercises     Assessment/Plan    PT Assessment Patient needs continued PT services  PT Problem List Decreased strength;Decreased range of motion;Decreased activity tolerance;Decreased balance;Decreased mobility;Decreased coordination;Decreased cognition;Decreased knowledge of use of DME;Decreased safety awareness;Cardiopulmonary status limiting activity;Obesity       PT Treatment Interventions DME instruction;Gait training;Functional mobility training;Therapeutic activities;Therapeutic exercise;Balance training;Neuromuscular re-education;Patient/family education;Cognitive remediation;Wheelchair mobility training    PT Goals (Current goals can be found in the Care Plan section)  Acute Rehab PT Goals Patient Stated Goal: none stated PT Goal Formulation: With patient Time For Goal Achievement: 07/18/21 Potential to Achieve Goals: Fair    Frequency Min 2X/week   Barriers to discharge        Co-evaluation               AM-PAC PT "6 Clicks" Mobility  Outcome Measure Help needed turning from your back to your side while in a flat bed without using bedrails?: Total Help needed moving from lying on your back to sitting on the side of a flat bed without using bedrails?: Total Help needed moving to and from a bed to a chair (including a wheelchair)?: Total Help needed standing up from a chair using your arms (e.g., wheelchair or bedside chair)?: Total Help needed to walk in hospital room?: Total Help needed climbing 3-5 steps with a railing? : Total 6  Click Score: 6    End of Session   Activity Tolerance: Patient limited by lethargy Patient left: in bed;with call bell/phone within reach;with bed alarm set Nurse Communication: Mobility status;Other (comment) (wound on L foot lateral met head of great toe) PT Visit Diagnosis: Other abnormalities of gait and mobility (R26.89);Muscle weakness (generalized) (M62.81);Adult, failure to thrive (R62.7)    Time: 9518-8416 PT Time Calculation (min) (ACUTE ONLY): 15 min   Charges:   PT Evaluation $PT Eval Moderate Complexity: 1 Mod           Tori Avannah Decker PT, DPT 07/04/21, 2:12 PM

## 2021-07-04 NOTE — Progress Notes (Signed)
Patient ID: Lucas [name], male   DOB: 12-10-1945, 75 y.o.   MRN: [id number] S:Feels "rough" today and pulled off electrodes for telemetry. O:BP (!) 152/132   Pulse 83   Temp 98.9 F (37.2 C) (Oral)   Resp 20   Ht 5\' 8"  (1.727 m)   Wt 81.2 kg   SpO2 98%   BMI 27.22 kg/m   Intake/Output Summary (Last 24 hours) at 07/04/2021 1043 Last data filed at 07/04/2021 1027 Gross per 24 hour  Intake 600 ml  Output 1700 ml  Net -1100 ml   Intake/Output: I/O last 3 completed shifts: In: 1807.9 [P.O.:900; I.V.:907.9] Out: 1150 [Urine:1150]  Intake/Output this shift:  Total I/O In: -  Out: 750 [Urine:750] Weight change:  ATF:TDDUKG mentation, NAD CVS: RRR Resp:transmitted upper airway sounds Abd:+BS, distended, tense Ext:1+ bilateral pretibial edema with erythema  Recent Labs  Lab 06/28/21 0447 06/28/21 1136 06/29/21 0451 06/30/21 0406 07/01/21 0431 07/02/21 0413 07/03/21 0339 07/04/21 0516  NA 139 139 141 139 138 137 137 137  K 5.3* 4.4 4.6 4.8 5.1 5.4* 4.8 4.8  CL 102 101 107 105 104 102 102 101  CO2 21* 21* 18* 20* 20* 20* 22 24  GLUCOSE 111* 121* 86 83 107* 86 86 99  BUN 84* 91* 99* 80* 118* 116* 108* 124*  CREATININE 5.46* 5.72* 6.32* 7.31* 8.26* 8.17* 8.40* 7.97*  ALBUMIN 3.3*  --  2.4* 2.2* 2.2* 2.2* 1.8* 2.0*  CALCIUM 7.5* 7.6* 6.9* 7.0* 6.9* 7.1* 6.6* 7.0*  AST 711*  --  648* 538* 386* 294* 142* 96*  ALT 20  --  17 17 16 20 12 13    Liver Function Tests: Recent Labs  Lab 07/02/21 0413 07/03/21 0339 07/04/21 0516  AST 294* 142* 96*  ALT 20 12 13   ALKPHOS 66 80 87  BILITOT 1.0 0.8 0.8  PROT 5.3* 4.6* 5.1*  ALBUMIN 2.2* 1.8* 2.0*   No results for input(s): LIPASE, AMYLASE in the last 168 hours. Recent Labs  Lab 06/27/21 1845  AMMONIA 26   CBC: Recent Labs  Lab 06/28/21 0447 06/29/21 0451 06/30/21 0406 07/01/21 0431 07/02/21 0413  WBC 13.5* 13.9* 12.0* 12.6* 11.2*  HGB 13.4 11.2* 11.3* 11.5* 11.7*  HCT 40.6 34.7* 34.5* 35.7* 36.5*  MCV 97.1  98.9 98.9 98.9 99.5  PLT 202 156 158 170 167   Cardiac Enzymes: Recent Labs  Lab 06/30/21 0406 07/01/21 0431 07/02/21 0413 07/03/21 0339 07/04/21 0516  CKTOTAL 34,005* 30,202* 24,350* 13,734* 10,131*   CBG: No results for input(s): GLUCAP in the last 168 hours.  Iron Studies: No results for input(s): IRON, TIBC, TRANSFERRIN, FERRITIN in the last 72 hours. Studies/Results: DG CHEST PORT 1 VIEW  Result Date: 07/03/2021 CLINICAL DATA:  Shortness of breath EXAM: PORTABLE CHEST 1 VIEW COMPARISON:  Chest x-ray 06/27/2021 FINDINGS: Heart size is upper normal. Mediastinum appears stable. No focal consolidation identified. No significant pleural effusion. No pneumothorax. IMPRESSION: No acute intrathoracic process identified. Electronically Signed   By: Ofilia Neas M.D.   On: 07/03/2021 12:43    aspirin EC  81 mg Oral Daily   carbidopa-levodopa  1 tablet Oral TID   Chlorhexidine Gluconate Cloth  6 each Topical Daily   gabapentin  300 mg Oral BID   heparin  5,000 Units Subcutaneous Q8H   nystatin   Topical BID   sodium bicarbonate  1,300 mg Oral TID    BMET    Component Value Date/Time   NA 137 07/04/2021 0516  K 4.8 07/04/2021 0516   CL 101 07/04/2021 0516   CO2 24 07/04/2021 0516   GLUCOSE 99 07/04/2021 0516   BUN 124 (H) 07/04/2021 0516   CREATININE 7.97 (H) 07/04/2021 0516   CREATININE 0.96 06/24/2017 1155   CALCIUM 7.0 (L) 07/04/2021 0516   GFRNONAA 7 (L) 07/04/2021 0516   GFRNONAA 80 06/24/2017 1155   GFRAA 59 (L) 05/09/2020 2123   GFRAA 93 06/24/2017 1155   CBC    Component Value Date/Time   WBC 11.2 (H) 07/02/2021 0413   RBC 3.67 (L) 07/02/2021 0413   HGB 11.7 (L) 07/02/2021 0413   HCT 36.5 (L) 07/02/2021 0413   PLT 167 07/02/2021 0413   MCV 99.5 07/02/2021 0413   MCH 31.9 07/02/2021 0413   MCHC 32.1 07/02/2021 0413   RDW 12.7 07/02/2021 0413   LYMPHSABS 2.2 06/09/2021 0021   MONOABS 0.6 06/09/2021 0021   EOSABS 0.2 06/09/2021 0021   BASOSABS 0.1  06/09/2021 0021    Assessment/Plan:   AKI/CKD stage IIIa - multifactorial in setting of rhabdomyolysis, prolonged re-renal injury in setting of solitary kidney as well as lasix, HCTZ, and ACE-inhibition.  BUN/Cr up slightly but CK levels falling.  Will order foley catheter as he likely has some urinary retention from his Parkinson's and we need better I's/O's.  He is not a suitable candidate for dialysis given his poor functional and nutritional status as well as his multiple co-morbidities.  Recommend conservative management for now and appreciate Palliative care's assistance. Thankfully he is responding to conservative management with continued improvement of CK levels and now with improving Cr, but rising BUN.   Will increase IVF's given negative and rising BUN. Rhabdomyolysis - unknown time down at home.  CK levels improving with IVF's.  Will decrease rate to 100 ml/hr of LR.  Foley placement to follow I's/o's.  AMS - likely combination of meds and AKI. Parkinson's - apparently poor functional status and is incontinent at home.  Palliative care following. HTN - off ace and diuretics for now. BP stable. Hyperkalemia - improved with lokelma.  Continue to follow. AGMA - on bicarb and LR. Bladder mass- consider urology evaluation  BOO- marked UOP after placement of Foley (900 cc's after placement).  Likely due to neurogenic bladder.  Continue with indwelling foley catheter and will need outpatient urologic evaluation. Dispositon - poor overall prognosis given co-morbidities (Parkinson's, dementia), poor functional and nutritional status with FTT.  Appreciate Palliative care's input.  Pt is now DNR.  Donetta Potts, MD [employer] [phone]

## 2021-07-04 NOTE — NC FL2 (Signed)
[place] MEDICAID FL2 LEVEL OF CARE SCREENING TOOL     IDENTIFICATION  Patient Name: Lucas [name] Birthdate: [birth year] Sex: male Admission Date (Current Location): 06/27/2021  [facility] and [place] Number:  [employer] and Address:  [facility] [address] [place]      Provider Number: [id number]  Attending Physician Name and Address:  Rodena Goldmann, DO  Relative Name and Phone Number:       Current Level of Care: Hospital Recommended Level of Care: [place] Prior Approval Number:    Date Approved/Denied:   PASRR Number:    Discharge Plan: SNF    Current Diagnoses: Patient Active Problem List   Diagnosis Date Noted   Acute metabolic encephalopathy 93/26/7124   Bladder mass 06/27/2021   Hypotension 03/27/2020   Left leg pain 03/27/2020   AKI (acute kidney injury) ([place]) 03/27/2020   Cellulitis 03/27/2020   Restless leg syndrome 03/27/2020   Stroke ([place]) 01/25/2017   Essential hypertension 06/06/2015   Hyperlipidemia 06/06/2015   COPD (chronic obstructive pulmonary disease) ([place]) 06/06/2015   Multiple falls 06/05/2015   Cerebral ventriculomegaly 01/20/2015   Generalized weakness 01/19/2015   Facial droop 01/19/2015   Tobacco abuse 01/19/2015   Obesity 01/19/2015   Depression with anxiety 01/19/2015   Hyperglycemia 01/19/2015   Dysarthria 01/15/2015   Parkinson's disease ([place])     Orientation RESPIRATION BLADDER Height & Weight     Self, Place  O2 (see dc summary) Continent Weight: 179 lb 0.2 oz (81.2 kg) Height:  5\' 8"  (172.7 cm)  BEHAVIORAL SYMPTOMS/MOOD NEUROLOGICAL BOWEL NUTRITION STATUS      Continent Diet (see dc summary)  AMBULATORY STATUS COMMUNICATION OF NEEDS Skin   Extensive Assist Verbally Other (Comment), PU Stage and Appropriate Care (L Heel deep tissue injury closed, L buttocks deep tissue injury closed skin, Rectum Mid Stage II pressure sore)   PU Stage 2 Dressing: Daily                    Personal Care Assistance Level of Assistance  Bathing, Feeding, Dressing Bathing Assistance: Maximum assistance Feeding assistance: Independent Dressing Assistance: Maximum assistance     Functional Limitations Info  Sight, Hearing, Speech Sight Info: Adequate Hearing Info: Adequate Speech Info: Adequate    SPECIAL CARE FACTORS FREQUENCY  PT (By licensed PT), OT (By licensed OT)     PT Frequency: 5x week OT Frequency: 3x week            Contractures Contractures Info: Not present    Additional Factors Info  Code Status, Allergies, Psychotropic Code Status Info: DNR Allergies Info: NKA Psychotropic Info: Buspar, Paxil, Klonopin         Current Medications (07/04/2021):  This is the current hospital active medication list Current Facility-Administered Medications  Medication Dose Route Frequency Provider Last Rate Last Admin   acetaminophen (TYLENOL) tablet 650 mg  650 mg Oral Q6H PRN Kathie Dike, MD   650 mg at 07/04/21 0023   Or   acetaminophen (TYLENOL) suppository 650 mg  650 mg Rectal Q6H PRN Kathie Dike, MD       albuterol (PROVENTIL) (2.5 MG/3ML) 0.083% nebulizer solution 2.5 mg  2.5 mg Nebulization Q2H PRN Kathie Dike, MD   2.5 mg at 07/03/21 2157   aspirin EC tablet 81 mg  81 mg Oral Daily Kathie Dike, MD   81 mg at 07/04/21 0807   carbidopa-levodopa (SINEMET IR) 25-100 MG per tablet immediate release  1 tablet  1 tablet Oral TID Kathie Dike, MD   1 tablet at 07/04/21 0807   Chlorhexidine Gluconate Cloth 2 % PADS 6 each  6 each Topical Daily Heath Lark D, DO   6 each at 07/04/21 0808   gabapentin (NEURONTIN) capsule 300 mg  300 mg Oral BID Adefeso, Oladapo, DO   300 mg at 07/04/21 0626   heparin injection 5,000 Units  5,000 Units Subcutaneous Q8H Kathie Dike, MD   5,000 Units at 07/04/21 1445   lactated ringers infusion   Intravenous Continuous Donato Heinz, MD 100 mL/hr at 07/04/21 0200 New Bag at 07/04/21 0200    LORazepam (ATIVAN) injection 2 mg  2 mg Intravenous Q6H PRN Heath Lark D, DO   2 mg at 07/03/21 1819   nystatin (MYCOSTATIN/NYSTOP) topical powder   Topical BID Heath Lark D, DO   Given at 07/04/21 0808   ondansetron (ZOFRAN) tablet 4 mg  4 mg Oral Q6H PRN Kathie Dike, MD       Or   ondansetron (ZOFRAN) injection 4 mg  4 mg Intravenous Q6H PRN Kathie Dike, MD       sodium bicarbonate tablet 1,300 mg  1,300 mg Oral TID Gean Quint, MD   1,300 mg at 07/04/21 9485     Discharge Medications: Please see discharge summary for a list of discharge medications.  Relevant Imaging Results:  Relevant Lab Results:   Additional Information SSN: [id number], LCSW

## 2021-07-04 NOTE — TOC Progression Note (Signed)
Transition of Care (TOC) - Progression Note    Patient Details  Name: Lucas [name] MRN: [id number] Date of Birth: 07/12/1946  Transition of Care (TOC) CM/SW Contact   , LCSW Phone Number: 07/04/2021, 3:13 PM  Clinical Narrative:     TOC following. PT recommending SNF rehab at dc. Met with pt to discuss. Pt agreeable to SNF referrals. Discussed CMS provider options. Will refer to [facility] and [place] snfs as requested. Pt will need insurance auth prior to dc to SNF.  TOC will follow.  Expected Discharge Plan: Skilled Nursing Facility Barriers to Discharge: Continued Medical Work up  Expected Discharge Plan and Services Expected Discharge Plan: Skilled Nursing Facility In-house Referral: Clinical Social Work   Post Acute Care Choice: Skilled Nursing Facility Living arrangements for the past 2 months: Single Family Home                                       Social Determinants of Health (SDOH) Interventions    Readmission Risk Interventions Readmission Risk Prevention Plan 07/04/2021 06/29/2021 03/28/2020  Transportation Screening Complete Complete Complete  Home Care Screening Complete Complete Complete  Medication Review (RN CM) Complete - Complete  Some recent data might be hidden    

## 2021-07-04 NOTE — Progress Notes (Signed)
DO made aware of pts increasing edema in scrotum area, DO decreased fluid order and ordered an [place] of scrotum, and stated he would be consulting with another MD. No new orders for consults to be made at this time. Pt was placed in bed position to allow a "non pooling" position in the pelvic region and fluids were cut back as ordered.

## 2021-07-04 NOTE — Plan of Care (Signed)
  Problem: Acute Rehab PT Goals(only PT should resolve) Goal: Pt will Roll Supine to Side Outcome: Progressing Flowsheets (Taken 07/04/2021 1417) Pt will Roll Supine to Side:  with mod assist  with +2 Goal: Pt Will Go Supine/Side To Sit Outcome: Progressing Flowsheets (Taken 07/04/2021 1417) Pt will go Supine/Side to Sit:  with maximum assist  with +2 Goal: Pt Will Go Sit To Supine/Side Outcome: Progressing Flowsheets (Taken 07/04/2021 1417) Pt will go Sit to Supine/Side:  with moderate assist  with +2 Goal: Pt Will Transfer Bed To Chair/Chair To Bed Outcome: Progressing Flowsheets (Taken 07/04/2021 1417) Pt will Transfer Bed to Chair/Chair to Bed:  with mod assist  with +2   Tori Keirstan Iannello PT, DPT 07/04/21, 2:17 PM

## 2021-07-04 NOTE — Progress Notes (Signed)
Transition of Care (TOC) -30 day Note       Patient Details  Name: Lucas [name] MRN: [id number] Date of Birth: [birth year]   Transition [facility]) CM/SW Contact  Name: Shade Flood, LCSW Phone Number: [phone] Date: 07/04/2021 Time: 1526    To Whom it May Concern:   Please be advised that the above patient will require a short-term nursing home stay, anticipated 30 days or less rehabilitation and strengthening. The plan is for return home.

## 2021-07-04 NOTE — Progress Notes (Signed)
Patient agitated and yelling out. Tried repositioning patient, dimming lights, and redirection. Still anxious and yelling out. Ativan dose given.

## 2021-07-04 NOTE — Progress Notes (Signed)
PROGRESS NOTE    Lucas [name]  CLE:[id number] DOB: 07/13/1946 DOA: 06/27/2021 PCP: Alfonse Flavors, MD   Brief Narrative:   Lucas [name] is a 75 y.o. male with medical history significant of Parkinson's disease, prior CVA, COPD, tobacco use, hypertension, chronic lower extremity edema, hyperlipidemia, normally lives at home.  Family had brought him to ED due to what appears to be a chronic decline in his overall condition with confusion.  Patient was admitted for acute metabolic encephalopathy and associated acute kidney injury that appears to be due to rhabdomyolysis.  Patient continues to require aggressive IV fluids per nephrology.  Continue to monitor creatinine trend and follow for outcomes.  Nephrology and palliative care following.  Assessment & Plan:   Active Problems:   Parkinson's disease ([place])   Generalized weakness   Essential hypertension   Hyperlipidemia   COPD (chronic obstructive pulmonary disease) ([facility])   AKI (acute kidney injury) ([place])   Acute metabolic encephalopathy   Bladder mass   Acute kidney injury secondary to rhabdomyolysis -Continuing with conservative management -Urine output appears nonoliguric, but Cr continues to increase; UO almost 2 liters -Also with decreased p.o. intake in the setting of ongoing diuretic use and lisinopril. -Imaging of the abdomen did not reveal any hydronephrosis -Appreciate nephrology recommendations with plans to continue aggressive IV fluid -Patient is not a candidate for dialysis and palliative consultation requested -IV fluids changed to LR per nephrology with sodium bicarbonate tablets added for AGMA -Continue aggressive IV fluid with decreased rate and repeat monitoring in a.m. -Foley per Nephrology to monitor UO   Transaminitis 2/2 rhabdomyolysis-improving -Continue to monitor -Improving CK levels noted  Bladder outlet obstruction -Status post Foley catheter -Continue for now and will require outpatient  urology evaluation for void trial   Acute metabolic encephalopathy -I suspect this is related to renal failure along with recent morphine use in the setting of AKI -ABG, TSH, and ammonia within normal limits -Continue to monitor for improvement pending further improvement in renal function -Holding sedating meds   Hypertension -Holding antihypertensives now since he was likely hypovolemic -Can consider resuming once volume issues have stabilized   Parkinson's disease -Continue on Sinemet   Hyperlipidemia -Continue statin   Bladder mass -Appears to have been present on CT imaging from 2019 and appears unchanged -This can be followed outpatient by urology   COPD -No wheezing or shortness of breath at this time -Continue as needed bronchodilators   Generalized weakness -Physical therapy evaluation ordered 11/8 -May need skilled nursing facility placement if agreeable   Goals of care -Discussed with patient's son who reports that currently patient is full code -His son also did mention that patient is being followed by hospice, and may have an aide to help with his bathing -He did stress the patient is currently not on comfort care -We will need further information from participating hospice.     DVT prophylaxis: Heparin Code Status: DNR Family Communication: Son, Audry Pili at bedside 11/8 Disposition Plan:  Status is: Inpatient   Remains inpatient appropriate because: Need for IV fluid.     Skin Assessment:   I have examined the patient's skin and I agree with the wound assessment as performed by the wound care RN as outlined below:   Pressure Injury 03/04/21 Sacrum Mid Stage 2 -  Partial thickness loss of dermis presenting as a shallow open injury with a red, pink wound bed without slough. (Active)  03/04/21 2245  Location: Sacrum  Location Orientation: Mid  Staging: Stage 2 -  Partial thickness loss of dermis presenting as a shallow open injury with a red, pink wound  bed without slough.  Wound Description (Comments):   Present on Admission: Yes      Consultants:  Nephrology Palliative care   Procedures:  See below   Antimicrobials:  None   Subjective: Patient seen and evaluated today with no new concerns or complaints noted.  Son is at bedside.  Objective: Vitals:   07/03/21 1832 07/03/21 2111 07/03/21 2158 07/04/21 0432  BP:  (!) 149/123  (!) 152/132  Pulse:  75  83  Resp:  20  20  Temp:  99.1 F (37.3 C)  98.9 F (37.2 C)  TempSrc:  Oral  Oral  SpO2: 97% 96% 95% 98%  Weight:      Height:        Intake/Output Summary (Last 24 hours) at 07/04/2021 1133 Last data filed at 07/04/2021 1027 Gross per 24 hour  Intake 720 ml  Output 1700 ml  Net -980 ml   Filed Weights   06/27/21 1109 06/27/21 1546  Weight: 90.7 kg 81.2 kg    Examination:  General exam: Appears calm and comfortable  Respiratory system: Clear to auscultation. Respiratory effort normal.  Currently on 3-4 L nasal cannula oxygen Cardiovascular system: S1 & S2 heard, RRR.  Gastrointestinal system: Abdomen is soft Central nervous system: Alert and awake Extremities: No edema Skin: No significant lesions noted Psychiatry: Flat affect. Foley catheter with clear, yellow urine output noted    Data Reviewed: I have personally reviewed following labs and imaging studies  CBC: Recent Labs  Lab 06/28/21 0447 06/29/21 0451 06/30/21 0406 07/01/21 0431 07/02/21 0413  WBC 13.5* 13.9* 12.0* 12.6* 11.2*  HGB 13.4 11.2* 11.3* 11.5* 11.7*  HCT 40.6 34.7* 34.5* 35.7* 36.5*  MCV 97.1 98.9 98.9 98.9 99.5  PLT 202 156 158 170 157   Basic Metabolic Panel: Recent Labs  Lab 06/30/21 0406 07/01/21 0431 07/02/21 0413 07/03/21 0339 07/04/21 0516  NA 139 138 137 137 137  K 4.8 5.1 5.4* 4.8 4.8  CL 105 104 102 102 101  CO2 20* 20* 20* 22 24  GLUCOSE 83 107* 86 86 99  BUN 80* 118* 116* 108* 124*  CREATININE 7.31* 8.26* 8.17* 8.40* 7.97*  CALCIUM 7.0* 6.9* 7.1* 6.6*  7.0*  MG 2.2 2.2 2.3 2.1 2.1   GFR: Estimated Creatinine Clearance: 8 mL/min (A) (by C-G formula based on SCr of 7.97 mg/dL (H)). Liver Function Tests: Recent Labs  Lab 06/30/21 0406 07/01/21 0431 07/02/21 0413 07/03/21 0339 07/04/21 0516  AST 538* 386* 294* 142* 96*  ALT 17 16 20 12 13   ALKPHOS 56 55 66 80 87  BILITOT 1.2 0.8 1.0 0.8 0.8  PROT 5.3* 5.3* 5.3* 4.6* 5.1*  ALBUMIN 2.2* 2.2* 2.2* 1.8* 2.0*   No results for input(s): LIPASE, AMYLASE in the last 168 hours. Recent Labs  Lab 06/27/21 1845  AMMONIA 26   Coagulation Profile: No results for input(s): INR, PROTIME in the last 168 hours. Cardiac Enzymes: Recent Labs  Lab 06/30/21 0406 07/01/21 0431 07/02/21 0413 07/03/21 0339 07/04/21 0516  CKTOTAL 34,005* 30,202* 24,350* 13,734* 10,131*   BNP (last 3 results) No results for input(s): PROBNP in the last 8760 hours. HbA1C: No results for input(s): HGBA1C in the last 72 hours. CBG: No results for input(s): GLUCAP in the last 168 hours. Lipid Profile: No results for input(s): CHOL, HDL, LDLCALC, TRIG, CHOLHDL, LDLDIRECT in the last 72  hours. Thyroid Function Tests: No results for input(s): TSH, T4TOTAL, FREET4, T3FREE, THYROIDAB in the last 72 hours. Anemia Panel: No results for input(s): VITAMINB12, FOLATE, FERRITIN, TIBC, IRON, RETICCTPCT in the last 72 hours. Sepsis Labs: No results for input(s): PROCALCITON, LATICACIDVEN in the last 168 hours.  Recent Results (from the past 240 hour(s))  Resp Panel by RT-PCR (Flu A&B, Covid) Nasopharyngeal Swab     Status: None   Collection Time: 06/27/21 12:07 PM   Specimen: Nasopharyngeal Swab; Nasopharyngeal(NP) swabs in vial transport medium  Result Value Ref Range Status   SARS Coronavirus 2 by RT PCR NEGATIVE NEGATIVE Final    Comment: (NOTE) SARS-CoV-2 target nucleic acids are NOT DETECTED.  The SARS-CoV-2 RNA is generally detectable in upper respiratory specimens during the acute phase of infection. The  lowest concentration of SARS-CoV-2 viral copies this assay can detect is 138 copies/mL. A negative result does not preclude SARS-Cov-2 infection and should not be used as the sole basis for treatment or other patient management decisions. A negative result may occur with  improper specimen collection/handling, submission of specimen other than nasopharyngeal swab, presence of viral mutation(s) within the areas targeted by this assay, and inadequate number of viral copies(<138 copies/mL). A negative result must be combined with clinical observations, patient history, and epidemiological information. The expected result is Negative.  Fact Sheet for Patients:  EntrepreneurPulse.com.au  Fact Sheet for Healthcare Providers:  IncredibleEmployment.be  This test is no t yet approved or cleared by the [place] FDA and  has been authorized for detection and/or diagnosis of SARS-CoV-2 by FDA under an Emergency Use Authorization (EUA). This EUA will remain  in effect (meaning this test can be used) for the duration of the COVID-19 declaration under Section 564(b)(1) of the Act, 21 U.S.C.section 360bbb-3(b)(1), unless the authorization is terminated  or revoked sooner.       Influenza A by PCR NEGATIVE NEGATIVE Final   Influenza B by PCR NEGATIVE NEGATIVE Final    Comment: (NOTE) The Xpert Xpress SARS-CoV-2/FLU/RSV plus assay is intended as an aid in the diagnosis of influenza from Nasopharyngeal swab specimens and should not be used as a sole basis for treatment. Nasal washings and aspirates are unacceptable for Xpert Xpress SARS-CoV-2/FLU/RSV testing.  Fact Sheet for Patients: EntrepreneurPulse.com.au  Fact Sheet for Healthcare Providers: IncredibleEmployment.be  This test is not yet approved or cleared by the [place] FDA and has been authorized for detection and/or diagnosis of SARS-CoV-2 by FDA under  an Emergency Use Authorization (EUA). This EUA will remain in effect (meaning this test can be used) for the duration of the COVID-19 declaration under Section 564(b)(1) of the Act, 21 U.S.C. section 360bbb-3(b)(1), unless the authorization is terminated or revoked.  Performed at [facility], [address], [place], [place] [address]          Radiology Studies: DG CHEST PORT 1 VIEW  Result Date: 07/03/2021 CLINICAL DATA:  Shortness of breath EXAM: PORTABLE CHEST 1 VIEW COMPARISON:  Chest x-ray 06/27/2021 FINDINGS: Heart size is upper normal. Mediastinum appears stable. No focal consolidation identified. No significant pleural effusion. No pneumothorax. IMPRESSION: No acute intrathoracic process identified. Electronically Signed   By: Ofilia Neas M.D.   On: 07/03/2021 12:43        Scheduled Meds:  aspirin EC  81 mg Oral Daily   carbidopa-levodopa  1 tablet Oral TID   Chlorhexidine Gluconate Cloth  6 each Topical Daily   gabapentin  300 mg Oral BID   heparin  5,000 Units Subcutaneous Q8H   nystatin   Topical BID   sodium bicarbonate  1,300 mg Oral TID   Continuous Infusions:  lactated ringers 100 mL/hr at 07/04/21 0200     LOS: 6 days    Time spent: 35 minutes    Izack Hoogland Darleen Crocker, DO [facility]  If 7PM-7AM, please contact night-coverage [email or web address] 07/04/2021, 11:33 AM

## 2021-07-05 ENCOUNTER — Inpatient Hospital Stay (HOSPITAL_COMMUNITY): Payer: Medicare Other

## 2021-07-05 DIAGNOSIS — R531 Weakness: Secondary | ICD-10-CM

## 2021-07-05 DIAGNOSIS — N3289 Other specified disorders of bladder: Secondary | ICD-10-CM | POA: Diagnosis not present

## 2021-07-05 DIAGNOSIS — N179 Acute kidney failure, unspecified: Secondary | ICD-10-CM | POA: Diagnosis not present

## 2021-07-05 DIAGNOSIS — G9341 Metabolic encephalopathy: Secondary | ICD-10-CM | POA: Diagnosis not present

## 2021-07-05 DIAGNOSIS — E785 Hyperlipidemia, unspecified: Secondary | ICD-10-CM

## 2021-07-05 DIAGNOSIS — Z7189 Other specified counseling: Secondary | ICD-10-CM | POA: Diagnosis not present

## 2021-07-05 DIAGNOSIS — J449 Chronic obstructive pulmonary disease, unspecified: Secondary | ICD-10-CM | POA: Diagnosis not present

## 2021-07-05 DIAGNOSIS — G20 Parkinson's disease: Secondary | ICD-10-CM | POA: Diagnosis not present

## 2021-07-05 DIAGNOSIS — L89300 Pressure ulcer of unspecified buttock, unstageable: Secondary | ICD-10-CM

## 2021-07-05 DIAGNOSIS — Z515 Encounter for palliative care: Secondary | ICD-10-CM | POA: Diagnosis not present

## 2021-07-05 DIAGNOSIS — L8990 Pressure ulcer of unspecified site, unspecified stage: Secondary | ICD-10-CM | POA: Insufficient documentation

## 2021-07-05 LAB — RENAL FUNCTION PANEL
Albumin: 2 g/dL — ABNORMAL LOW (ref 3.5–5.0)
Anion gap: 16 — ABNORMAL HIGH (ref 5–15)
BUN: 130 mg/dL — ABNORMAL HIGH (ref 8–23)
CO2: 23 mmol/L (ref 22–32)
Calcium: 7.1 mg/dL — ABNORMAL LOW (ref 8.9–10.3)
Chloride: 100 mmol/L (ref 98–111)
Creatinine, Ser: 8.02 mg/dL — ABNORMAL HIGH (ref 0.61–1.24)
GFR, Estimated: 7 mL/min — ABNORMAL LOW (ref >60)
Glucose, Bld: 88 mg/dL (ref 70–99)
Phosphorus: 7.8 mg/dL — ABNORMAL HIGH (ref 2.5–4.6)
Potassium: 4.4 mmol/L (ref 3.5–5.1)
Sodium: 139 mmol/L (ref 135–145)

## 2021-07-05 LAB — CBC
HCT: 29.1 % — ABNORMAL LOW (ref 39.0–52.0)
Hemoglobin: 9.9 g/dL — ABNORMAL LOW (ref 13.0–17.0)
MCH: 32.9 pg (ref 26.0–34.0)
MCHC: 34 g/dL (ref 30.0–36.0)
MCV: 96.7 fL (ref 80.0–100.0)
Platelets: 198 10*3/uL (ref 150–400)
RBC: 3.01 MIL/uL — ABNORMAL LOW (ref 4.22–5.81)
RDW: 12.8 % (ref 11.5–15.5)
WBC: 9.3 10*3/uL (ref 4.0–10.5)
nRBC: 0 % (ref 0.0–0.2)

## 2021-07-05 LAB — CK: Total CK: 6250 U/L — ABNORMAL HIGH (ref 49–397)

## 2021-07-05 LAB — MAGNESIUM: Magnesium: 2.1 mg/dL (ref 1.7–2.4)

## 2021-07-05 MED ORDER — ALBUMIN HUMAN 25 % IV SOLN
12.5000 g | Freq: Two times a day (BID) | INTRAVENOUS | Status: AC
  Administered 2021-07-05 – 2021-07-06 (×2): 12.5 g via INTRAVENOUS
  Filled 2021-07-05 (×2): qty 50

## 2021-07-05 MED ORDER — OXYCODONE HCL 5 MG PO TABS
5.0000 mg | ORAL_TABLET | ORAL | Status: DC | PRN
  Administered 2021-07-07 – 2021-07-21 (×27): 5 mg via ORAL
  Filled 2021-07-05 (×28): qty 1

## 2021-07-05 MED ORDER — ALBUMIN HUMAN 25 % IV SOLN
12.5000 g | Freq: Once | INTRAVENOUS | Status: AC
  Administered 2021-07-05: 12.5 g via INTRAVENOUS
  Filled 2021-07-05: qty 50

## 2021-07-05 MED ORDER — ALPRAZOLAM 1 MG PO TABS
1.0000 mg | ORAL_TABLET | Freq: Every day | ORAL | Status: DC
  Administered 2021-07-05 – 2021-07-12 (×7): 1 mg via ORAL
  Filled 2021-07-05 (×8): qty 1

## 2021-07-05 MED ORDER — GABAPENTIN 100 MG PO CAPS
100.0000 mg | ORAL_CAPSULE | Freq: Two times a day (BID) | ORAL | Status: DC
  Administered 2021-07-05 – 2021-07-15 (×19): 100 mg via ORAL
  Filled 2021-07-05 (×20): qty 1

## 2021-07-05 NOTE — Progress Notes (Addendum)
Patient ID: Lucas [name], male   DOB: February 28, 1946, 75 y.o.   MRN: [id number] S: More lethargic this morning.  Per nursing was agitated this morning. O:BP (!) 96/58 (BP Location: Left Arm) Comment: RN Notified  Pulse 100 Comment: RN notified  Temp 98.1 F (36.7 C)   Resp 20   Ht 5\' 8"  (1.727 m)   Wt 81.2 kg   SpO2 99%   BMI 27.22 kg/m   Intake/Output Summary (Last 24 hours) at 07/05/2021 1041 Last data filed at 07/05/2021 0939 Gross per 24 hour  Intake 886 ml  Output 3000 ml  Net -2114 ml   Intake/Output: I/O last 3 completed shifts: In: 1490 [P.O.:1490] Out: 2947 [Urine:4150]  Intake/Output this shift:  Total I/O In: 116 [P.O.:116] Out: 550 [Urine:550] Weight change:  MLY:YTKPTWSFK, nonverbal CLE:XNTZG at 100 Resp: occ rhonchi Abd: +BS, soft, NT/nD Ext: 1+ BLE edema, + scrotal edema  Recent Labs  Lab 06/29/21 0451 06/30/21 0406 07/01/21 0431 07/02/21 0413 07/03/21 0339 07/04/21 0516 07/05/21 0445  NA 141 139 138 137 137 137 139  K 4.6 4.8 5.1 5.4* 4.8 4.8 4.4  CL 107 105 104 102 102 101 100  CO2 18* 20* 20* 20* 22 24 23   GLUCOSE 86 83 107* 86 86 99 88  BUN 99* 80* 118* 116* 108* 124* 130*  CREATININE 6.32* 7.31* 8.26* 8.17* 8.40* 7.97* 8.02*  ALBUMIN 2.4* 2.2* 2.2* 2.2* 1.8* 2.0* 2.0*  CALCIUM 6.9* 7.0* 6.9* 7.1* 6.6* 7.0* 7.1*  PHOS  --   --   --   --   --   --  7.8*  AST 648* 538* 386* 294* 142* 96*  --   ALT 17 17 16 20 12 13   --    Liver Function Tests: Recent Labs  Lab 07/02/21 0413 07/03/21 0339 07/04/21 0516 07/05/21 0445  AST 294* 142* 96*  --   ALT 20 12 13   --   ALKPHOS 66 80 87  --   BILITOT 1.0 0.8 0.8  --   PROT 5.3* 4.6* 5.1*  --   ALBUMIN 2.2* 1.8* 2.0* 2.0*   No results for input(s): LIPASE, AMYLASE in the last 168 hours. No results for input(s): AMMONIA in the last 168 hours. CBC: Recent Labs  Lab 06/29/21 0451 06/30/21 0406 07/01/21 0431 07/02/21 0413 07/05/21 0445  WBC 13.9* 12.0* 12.6* 11.2* 9.3  HGB 11.2* 11.3* 11.5*  11.7* 9.9*  HCT 34.7* 34.5* 35.7* 36.5* 29.1*  MCV 98.9 98.9 98.9 99.5 96.7  PLT 156 158 170 167 198   Cardiac Enzymes: Recent Labs  Lab 07/01/21 0431 07/02/21 0413 07/03/21 0339 07/04/21 0516 07/05/21 0445  CKTOTAL 30,202* 24,350* 13,734* 10,131* 6,250*   CBG: No results for input(s): GLUCAP in the last 168 hours.  Iron Studies: No results for input(s): IRON, TIBC, TRANSFERRIN, FERRITIN in the last 72 hours. Studies/Results: DG CHEST PORT 1 VIEW  Result Date: 07/03/2021 CLINICAL DATA:  Shortness of breath EXAM: PORTABLE CHEST 1 VIEW COMPARISON:  Chest x-ray 06/27/2021 FINDINGS: Heart size is upper normal. Mediastinum appears stable. No focal consolidation identified. No significant pleural effusion. No pneumothorax. IMPRESSION: No acute intrathoracic process identified. Electronically Signed   By: Ofilia Neas M.D.   On: 07/03/2021 12:43    ALPRAZolam  1 mg Oral QHS   aspirin EC  81 mg Oral Daily   carbidopa-levodopa  1 tablet Oral TID   Chlorhexidine Gluconate Cloth  6 each Topical Daily   gabapentin  300 mg Oral BID  heparin  5,000 Units Subcutaneous Q8H   nystatin   Topical BID   sodium bicarbonate  1,300 mg Oral TID    BMET    Component Value Date/Time   NA 139 07/05/2021 0445   K 4.4 07/05/2021 0445   CL 100 07/05/2021 0445   CO2 23 07/05/2021 0445   GLUCOSE 88 07/05/2021 0445   BUN 130 (H) 07/05/2021 0445   CREATININE 8.02 (H) 07/05/2021 0445   CREATININE 0.96 06/24/2017 1155   CALCIUM 7.1 (L) 07/05/2021 0445   GFRNONAA 7 (L) 07/05/2021 0445   GFRNONAA 80 06/24/2017 1155   GFRAA 59 (L) 05/09/2020 2123   GFRAA 93 06/24/2017 1155   CBC    Component Value Date/Time   WBC 9.3 07/05/2021 0445   RBC 3.01 (L) 07/05/2021 0445   HGB 9.9 (L) 07/05/2021 0445   HCT 29.1 (L) 07/05/2021 0445   PLT 198 07/05/2021 0445   MCV 96.7 07/05/2021 0445   MCH 32.9 07/05/2021 0445   MCHC 34.0 07/05/2021 0445   RDW 12.8 07/05/2021 0445   LYMPHSABS 2.2 06/09/2021 0021    MONOABS 0.6 06/09/2021 0021   EOSABS 0.2 06/09/2021 0021   BASOSABS 0.1 06/09/2021 0021    Assessment/Plan:   AKI/CKD stage IIIa - multifactorial in setting of rhabdomyolysis, prolonged re-renal injury in setting of solitary kidney as well as lasix, HCTZ, and ACE-inhibition.  BUN/Cr up slightly but CK levels falling.  Will order foley catheter as he likely has some urinary retention from his Parkinson's and we need better I's/O's.  He is not a suitable candidate for dialysis given his poor functional and nutritional status as well as his multiple co-morbidities.  Recommend conservative management for now and appreciate Palliative care's assistance. Thankfully he is responding to conservative management with continued improvement of CK levels and now with improving Cr, but rising BUN.   IVF's decreased yesterday due to scrotal edema, however BUN continues to climb and he is negative over 2 liters for the past 2 days, although 12 liters + since admission. Continue with conservative management, however his overall clinical picture is deteriorating with AMS and azotemia.  Appreciate Palliative care's assistance. Rhabdomyolysis - unknown time down at home.  CK levels improving with IVF's.  Will decrease rate to 100 ml/hr of LR.  Foley placement to follow I's/o's.  AMS - likely combination of meds and AKI.  Will decrease gabapentin to 100 mg bid and follow.  Parkinson's - apparently poor functional status and is incontinent at home.  Palliative care following. HTN - off ace and diuretics for now. BP low.  Will give IV albumin to help with intravascular volume. Hyperkalemia - improved with lokelma.  Continue to follow. AGMA - on bicarb and LR. Bladder mass- unclear if this is malignancy or stone.  BOO- marked UOP after placement of Foley (900 cc's after placement).  Likely due to neurogenic bladder.  Continue with indwelling foley catheter and will need outpatient urologic evaluation. Dispositon -  poor overall prognosis given co-morbidities (Parkinson's, dementia), poor functional and nutritional status with FTT.  Appreciate Palliative care's input.  Pt is now DNR.    Donetta Potts, MD [employer] [phone]

## 2021-07-05 NOTE — TOC Progression Note (Signed)
Transition of Care [facility]) - Progression Note    Patient Details  Name: Lucas [name] MRN: [id number] Date of Birth: [birth year]  [facility] CM/SW Contact  Salome Arnt, [place] Phone Number: 07/05/2021, 1:44 PM  Clinical Narrative:  LCSW provided bed offers and son chooses [facility]. Facility notified and will start authorization today. LCSW discussed with son, Audry Pili about back up plan if [facility] denies SNF. Ricky states pt cannot afford private pay SNF. He indicates he and his brother will be having conversation tonight and they also plan to meet with palliative care again tomorrow to further address goals of care. TOC will continue to follow.      Expected Discharge Plan: [place] Barriers to Discharge: Continued Medical Work up  Expected Discharge Plan and Services Expected Discharge Plan: [place] In-house Referral: Clinical Social Work   Post Acute Care Choice: [place] Living arrangements for the past 2 months: Single Family Home                                       Social Determinants of Health (SDOH) Interventions    Readmission Risk Interventions Readmission Risk Prevention Plan 07/04/2021 06/29/2021 03/28/2020  Transportation Screening Complete Complete Complete  Home Care Screening Complete Complete Complete  Medication Review (RN CM) Complete - Complete  Some recent data might be hidden

## 2021-07-05 NOTE — Progress Notes (Addendum)
Palliative: [name], Lucas [name], has been Lucas [name] lifted up to the [employer] chair.  He appears acutely/chronically ill, very frail.  He is somewhat alert, but does not make or keep eye contact.  He is able to tell me his name, and that we are in the hospital.  I am not sure that he can make his basic needs known.  His sister, Lucas [name], is at bedside.  We talked about his acute health concerns, no improvement in kidney function.  I ask Sister Lucas [name] how she thinks he is doing, and she tells me, "about the same".  Lucas [name] and Lucas [name] deny questions at this time.  Lucas [name] shares that [place] came by for visit this morning, and it would be best to call Lucas [name] for updates.  Lucas [name] follows me out of the room.  She asks how long I think Lucas [name] has.  I share that even with aggressive treatment it would not be surprising if he has a few weeks of life left.  I shared that the medical team is recommending hospice care.  Lucas [name] asks about comfort care.  She tells me, "hospice killed my mother and sister".  She tells me that she has shared her thoughts with Lucas [name].  Call to son, Lucas [name].  We talked at length about Lucas [name]'s acute health concerns including, but not limited to, worsening creatinine, improving CK, fluid overload, urine output, poor functional status, poor by mouth intake chronic illness disease burden.  We also talked about unknown bladder mass.  I share that the medical team including nephrology, hospitalist, and palliative medicine are recommending hospice care.  We talked about options such as residential hospice, home with hospice versus continuing to stay in the hospital with aggressive care.  Lucas [name] states that he and Lucas [name] will have a conference tonight about direction of care, but at this point Lucas [name] would prefer for Lucas [name] to continue to stay in the hospital.  Call to son Lucas [name].  We talked at length about chores acute health concerns including, but not limited to, worsening creatinine, improving CK, fluid overload, urine output,  poor functional status, poor by mouth intake, chronic illness disease burden.  I shared that the medical team including nephrology, hospitalist, and palliative medicine are recommending hospice care.  We talked about options such as residential hospice, home with hospice versus continuing to stay in the hospital with aggressive care.  Lucas [name] states that he and Lucas [name] will have a conference tonight for direction of care.  Conference with attending, nephrology, bedside nursing staff, transition of care team related to patient condition, needs, goals of care.  Three nephrologists share that [place] a candidate for hemodialysis.  Plan:   At this point, time for outcomes.  [name] are having family discussions tonight about direction of care.       Prognosis: Discussed with family with permission.  I shared that even with aggressive care, 2 weeks or less would not be surprising.         70 minutes, extended time. [name], NP Palliative medicine team Team phone [phone] Greater than 50% of this time was spent counseling and coordinating care related to the above assessment and plan.

## 2021-07-05 NOTE — Progress Notes (Addendum)
PROGRESS NOTE    HARM JOU  WGY:[id number] DOB: August 19, 1946 DOA: 06/27/2021 PCP: Alfonse Flavors, MD    Subjective: The patient was seen and examined this morning, lethargic, but arousable, following command.  Stating still able to urinate.  But not feeling well.    Brief Narrative:   Lucas [name] is a 75 y.o. male with medical history significant of Parkinson's disease, prior CVA, COPD, tobacco use, hypertension, chronic lower extremity edema, hyperlipidemia, normally lives at home.  Family had brought him to ED due to what appears to be a chronic decline in his overall condition with confusion.  Patient was admitted for acute metabolic encephalopathy and associated acute kidney injury that appears to be due to rhabdomyolysis.  Patient continues to require aggressive IV fluids per nephrology.  Continue to monitor creatinine trend and follow for outcomes.  Nephrology and palliative care following.  Assessment & Plan:   Active Problems:   Parkinson's disease ([place])   Generalized weakness   Essential hypertension   Hyperlipidemia   COPD (chronic obstructive pulmonary disease) ([facility])   AKI (acute kidney injury) ([place])   Acute metabolic encephalopathy   Bladder mass   Pressure injury of skin   Acute kidney injury secondary to rhabdomyolysis -On conservative management, monitoring -BUN 108, 124, 130, creatinine 8.4, 7.97, 8.02 today   Intake/Output Summary (Last 24 hours) at 07/05/2021 1359 Last data filed at 07/05/2021 7793 Gross per 24 hour  Intake 836 ml  Output 3000 ml  Net -2164 ml    -Progressively worsening creatinine -Urine output appears nonoliguric, but Cr continues to increase; monitoring urine output  -Also with decreased p.o. intake in the setting of ongoing diuretic use and lisinopril. -Imaging of the abdomen did not reveal any hydronephrosis -Appreciate nephrology recommendations -patient has been followed closely during this hospitalization, patient  continued decline worsening kidney function Determined the patient is heading towards palliative care, hospice Multiple input from nephrologist determined patient is not a candidate for hemodialysis   -Patient is not a candidate for dialysis and palliative consulted-working with family diligently to pursue with comfort care measures and hospice  -IV fluids changed to LR per nephrology with sodium bicarbonate tablets added for AGMA -Continue aggressive IV fluid with decreased rate and repeat monitoring  -Foley per Nephrology to monitor UO   Transaminitis 2/2 rhabdomyolysis-improving -Monitoring closely, improving -Improving CK levels noted  Bladder outlet obstruction -Status post Foley catheter -Continue for now and will require outpatient urology evaluation for void trial   Acute metabolic encephalopathy -Lethargic today, -Likely related to progressive renal failure, medication including morphine in setting of AKI,  -ABG, TSH, and ammonia within normal limits -Per nephrology renal function would not improve any further and not a dialysis candidate -Holding sedating meds   Hypertension -Holding antihypertensives now since he was likely hypovolemic -BP remained stable   Parkinson's disease-along with dementia -Continue on Sinemet   Hyperlipidemia -Continue statin   Bladder mass -Appears to have been present on CT imaging from 2019 and appears unchanged -This can be followed outpatient by urology   COPD -No wheezing or shortness of breath at this time Stable on 2 L of oxygen, satting> 92% -Continue as needed bronchodilators   Generalized weakness -Physical therapy evaluation ordered 11/8 -Patient has a severe global weakness, per nursing staff able to move with a Harrel Lemon lift Pursuing SNF   Goals of care -Palliative care and transition of care, on board, extensive family discussion regarding patient's goal of care, per nephrology and our  evaluation patient is declining  physically and mentally-poor prognosis, recommending comfort care measures with hospice -Family has agreed to DNR/DNI status -Family currently not on board with hospice -palliative care following diligently   -His son also did mention that patient is being followed by hospice, and may have an aide to help with his bathing -He did stress the patient is currently not on comfort care -We will need further information from participating hospice.     DVT prophylaxis: Heparin Code Status: DNR Family Communication: Discussing with family to determine goal of care Disposition Plan:  Status is: Inpatient   Remains inpatient appropriate because: Need for IV fluid.     Skin Assessment:   I have examined the patient's skin and I agree with the wound assessment as performed by the wound care RN as outlined below:   Pressure Injury 03/04/21 Sacrum Mid Stage 2 -  Partial thickness loss of dermis presenting as a shallow open injury with a red, pink wound bed without slough. (Active)  03/04/21 2245  Location: Sacrum  Location Orientation: Mid  Staging: Stage 2 -  Partial thickness loss of dermis presenting as a shallow open injury with a red, pink wound bed without slough.  Wound Description (Comments):   Present on Admission: Yes      Consultants:  Nephrology Palliative care   Procedures:  See below   Antimicrobials:  None  Objective: Vitals:   07/04/21 2105 07/05/21 0544 07/05/21 0800 07/05/21 1223  BP: (!) 140/50 (!) 81/56 (!) 96/58 (!) 138/51  Pulse: 77 74 100 75  Resp: 19 20 20 17   Temp: 99.6 F (37.6 C) 98.1 F (36.7 C)  98.1 F (36.7 C)  TempSrc: Oral   Oral  SpO2: 96% 94% 99% 94%  Weight:      Height:        Intake/Output Summary (Last 24 hours) at 07/05/2021 1359 Last data filed at 07/05/2021 0939 Gross per 24 hour  Intake 836 ml  Output 3000 ml  Net -2164 ml   Filed Weights   06/27/21 1109 06/27/21 1546  Weight: 90.7 kg 81.2 kg       Physical Exam:    General:  Lethargic but awake, following commands  HEENT:  Normocephalic, PERRL, otherwise with in Normal limits   Neuro:  CNII-XII intact. , normal motor and sensation, reflexes intact   Lungs:   Clear to auscultation BL, Respirations unlabored, no wheezes / crackles  Cardio:    S1/S2, RRR, No murmure, No Rubs or Gallops   Abdomen:   Soft, non-tender, bowel sounds active all four quadrants,  no guarding or peritoneal signs.  Muscular skeletal:  Limited exam - in bed, able to move all 4 extremities, Normal strength,  2+ pulses,  symmetric, No pitting edema  Skin:  Dry, warm to touch, negative for any Rashes,  Wounds: Please see nursing documentation  Pressure Injury 03/04/21 Sacrum Mid Stage 2 -  Partial thickness loss of dermis presenting as a shallow open injury with a red, pink wound bed without slough. (Active)  03/04/21 2245  Location: Sacrum  Location Orientation: Mid  Staging: Stage 2 -  Partial thickness loss of dermis presenting as a shallow open injury with a red, pink wound bed without slough.  Wound Description (Comments):   Present on Admission: Yes     Pressure Injury 06/28/21 Heel Left Deep Tissue Pressure Injury - Purple or maroon localized area of discolored intact skin or blood-filled blister due to damage of underlying soft tissue  from pressure and/or shear. (Active)  06/28/21 0800  Location: Heel  Location Orientation: Left  Staging: Deep Tissue Pressure Injury - Purple or maroon localized area of discolored intact skin or blood-filled blister due to damage of underlying soft tissue from pressure and/or shear.  Wound Description (Comments):   Present on Admission: Yes     Pressure Injury 06/28/21 Buttocks Left Deep Tissue Pressure Injury - Purple or maroon localized area of discolored intact skin or blood-filled blister due to damage of underlying soft tissue from pressure and/or shear. (Active)  06/28/21 0800  Location: Buttocks  Location Orientation: Left   Staging: Deep Tissue Pressure Injury - Purple or maroon localized area of discolored intact skin or blood-filled blister due to damage of underlying soft tissue from pressure and/or shear.  Wound Description (Comments):   Present on Admission: Yes     Pressure Injury 07/02/21 Rectum Mid Stage 2 -  Partial thickness loss of dermis presenting as a shallow open injury with a red, pink wound bed without slough. skin red, open, small amount of drainage (Active)  07/02/21 0845  Location: Rectum  Location Orientation: Mid  Staging: Stage 2 -  Partial thickness loss of dermis presenting as a shallow open injury with a red, pink wound bed without slough.  Wound Description (Comments): skin red, open, small amount of drainage  Present on Admission:           Data Reviewed: I have personally reviewed following labs and imaging studies  CBC: Recent Labs  Lab 06/29/21 0451 06/30/21 0406 07/01/21 0431 07/02/21 0413 07/05/21 0445  WBC 13.9* 12.0* 12.6* 11.2* 9.3  HGB 11.2* 11.3* 11.5* 11.7* 9.9*  HCT 34.7* 34.5* 35.7* 36.5* 29.1*  MCV 98.9 98.9 98.9 99.5 96.7  PLT 156 158 170 167 008   Basic Metabolic Panel: Recent Labs  Lab 07/01/21 0431 07/02/21 0413 07/03/21 0339 07/04/21 0516 07/05/21 0445  NA 138 137 137 137 139  K 5.1 5.4* 4.8 4.8 4.4  CL 104 102 102 101 100  CO2 20* 20* 22 24 23   GLUCOSE 107* 86 86 99 88  BUN 118* 116* 108* 124* 130*  CREATININE 8.26* 8.17* 8.40* 7.97* 8.02*  CALCIUM 6.9* 7.1* 6.6* 7.0* 7.1*  MG 2.2 2.3 2.1 2.1 2.1  PHOS  --   --   --   --  7.8*   GFR: Estimated Creatinine Clearance: 7.9 mL/min (A) (by C-G formula based on SCr of 8.02 mg/dL (H)). Liver Function Tests: Recent Labs  Lab 06/30/21 0406 07/01/21 0431 07/02/21 0413 07/03/21 0339 07/04/21 0516 07/05/21 0445  AST 538* 386* 294* 142* 96*  --   ALT 17 16 20 12 13   --   ALKPHOS 56 55 66 80 87  --   BILITOT 1.2 0.8 1.0 0.8 0.8  --   PROT 5.3* 5.3* 5.3* 4.6* 5.1*  --   ALBUMIN 2.2*  2.2* 2.2* 1.8* 2.0* 2.0*   No results for input(s): LIPASE, AMYLASE in the last 168 hours. No results for input(s): AMMONIA in the last 168 hours.  Coagulation Profile: No results for input(s): INR, PROTIME in the last 168 hours. Cardiac Enzymes: Recent Labs  Lab 07/01/21 0431 07/02/21 0413 07/03/21 0339 07/04/21 0516 07/05/21 0445  CKTOTAL 30,202* 24,350* 13,734* 10,131* 6,250*   BNP (last 3 results) No results for input(s): PROBNP in the last 8760 hours. HbA1C: No results for input(s): HGBA1C in the last 72 hours. CBG: No results for input(s): GLUCAP in the last 168 hours. Lipid Profile:  No results for input(s): CHOL, HDL, LDLCALC, TRIG, CHOLHDL, LDLDIRECT in the last 72 hours. Thyroid Function Tests: No results for input(s): TSH, T4TOTAL, FREET4, T3FREE, THYROIDAB in the last 72 hours. Anemia Panel: No results for input(s): VITAMINB12, FOLATE, FERRITIN, TIBC, IRON, RETICCTPCT in the last 72 hours. Sepsis Labs: No results for input(s): PROCALCITON, LATICACIDVEN in the last 168 hours.  Recent Results (from the past 240 hour(s))  Resp Panel by RT-PCR (Flu A&B, Covid) Nasopharyngeal Swab     Status: None   Collection Time: 06/27/21 12:07 PM   Specimen: Nasopharyngeal Swab; Nasopharyngeal(NP) swabs in vial transport medium  Result Value Ref Range Status   SARS Coronavirus 2 by RT PCR NEGATIVE NEGATIVE Final    Comment: (NOTE) SARS-CoV-2 target nucleic acids are NOT DETECTED.  The SARS-CoV-2 RNA is generally detectable in upper respiratory specimens during the acute phase of infection. The lowest concentration of SARS-CoV-2 viral copies this assay can detect is 138 copies/mL. A negative result does not preclude SARS-Cov-2 infection and should not be used as the sole basis for treatment or other patient management decisions. A negative result may occur with  improper specimen collection/handling, submission of specimen other than nasopharyngeal swab, presence of viral  mutation(s) within the areas targeted by this assay, and inadequate number of viral copies(<138 copies/mL). A negative result must be combined with clinical observations, patient history, and epidemiological information. The expected result is Negative.  Fact Sheet for Patients:  EntrepreneurPulse.com.au  Fact Sheet for Healthcare Providers:  IncredibleEmployment.be  This test is no t yet approved or cleared by the [place] FDA and  has been authorized for detection and/or diagnosis of SARS-CoV-2 by FDA under an Emergency Use Authorization (EUA). This EUA will remain  in effect (meaning this test can be used) for the duration of the COVID-19 declaration under Section 564(b)(1) of the Act, 21 U.S.C.section 360bbb-3(b)(1), unless the authorization is terminated  or revoked sooner.       Influenza A by PCR NEGATIVE NEGATIVE Final   Influenza B by PCR NEGATIVE NEGATIVE Final    Comment: (NOTE) The Xpert Xpress SARS-CoV-2/FLU/RSV plus assay is intended as an aid in the diagnosis of influenza from Nasopharyngeal swab specimens and should not be used as a sole basis for treatment. Nasal washings and aspirates are unacceptable for Xpert Xpress SARS-CoV-2/FLU/RSV testing.  Fact Sheet for Patients: EntrepreneurPulse.com.au  Fact Sheet for Healthcare Providers: IncredibleEmployment.be  This test is not yet approved or cleared by the [place] FDA and has been authorized for detection and/or diagnosis of SARS-CoV-2 by FDA under an Emergency Use Authorization (EUA). This EUA will remain in effect (meaning this test can be used) for the duration of the COVID-19 declaration under Section 564(b)(1) of the Act, 21 U.S.C. section 360bbb-3(b)(1), unless the authorization is terminated or revoked.  Performed at [facility], [address] [place], [place] [address]          Radiology Studies: US  SCROTUM  Result Date: 07/05/2021 CLINICAL DATA:  Edema EXAM: ULTRASOUND OF SCROTUM TECHNIQUE: Complete ultrasound examination of the testicles, epididymis, and other scrotal structures was performed. COMPARISON:  None. FINDINGS: Right testicle Measurements: 4.5 x 2.9 x 2.2 cm. No mass or microlithiasis visualized. Left testicle Measurements: 4 x 2.7 x 3 cm. No mass or microlithiasis visualized. Right epididymis:  Normal size.  7 mm epididymal head cyst. Left epididymis:  Normal in size and appearance. Hydrocele: Moderate right hydrocele. Moderate to large left hydrocele. Varicocele:  None visualized. Diffuse scrotal skin thickening  and subcutaneous edema with no focal collection identified. IMPRESSION: 1. No testicular mass identified. 2. Bilateral moderate to large hydroceles. 3. Diffuse scrotal skin thickening and subcutaneous edema with no focal collection identified. 4. Right epididymal head cyst. Electronically Signed   By: Ofilia Neas M.D.   On: 07/05/2021 11:56        Scheduled Meds:  ALPRAZolam  1 mg Oral QHS   aspirin EC  81 mg Oral Daily   carbidopa-levodopa  1 tablet Oral TID   Chlorhexidine Gluconate Cloth  6 each Topical Daily   gabapentin  100 mg Oral BID   heparin  5,000 Units Subcutaneous Q8H   nystatin   Topical BID   sodium bicarbonate  1,300 mg Oral TID   Continuous Infusions:  albumin human     lactated ringers 50 mL/hr at 07/04/21 1606     LOS: 7 days    Time spent: 35 minutes    Deatra James, MD [facility]  If 7PM-7AM, please contact night-coverage [email or web address] 07/05/2021, 1:59 PM

## 2021-07-05 NOTE — Progress Notes (Signed)
Physical Therapy Treatment Patient Details Name: Lucas [name] MRN: [id number] DOB: 03-Mar-1946 Today's Date: 07/05/2021   History of Present Illness Lucas [name] is a 75 y.o. male presents with chronic decline in his overall condition with confusion. Pt admitted for acute metabolic encephalopathy and associated acute kidney injury that appears to be due to rhabdomyolysis. PMH: Parkinson's disease, prior CVA, COPD, tobacco use, HTN, chronic lower extremity edema, hyperlipidemia    PT Comments    Patient presents with severe swelling in scrotal area - nursing staff aware.  Patient agreeable for therapy after encouragement, demonstrates slow labored movement for sitting up bedside and unable to move legs due to weakness requiring Max assist.  Patient has difficulty maintaining sitting balance with frequent leaning backwards due to poor trunk control, required repeated verbal/tactile to place hands at sides with fair carryover and unable to complete sit to stands due to BLE weakness and pain in feet.  Patient required Max assist to reposition when put back to bed.  Patient will benefit from continued physical therapy in hospital and recommended venue below to increase strength, balance, endurance for safe ADLs and gait.     Recommendations for follow up therapy are one component of a multi-disciplinary discharge planning process, led by the attending physician.  Recommendations may be updated based on patient status, additional functional criteria and insurance authorization.  Follow Up Recommendations  Skilled nursing-short term rehab (<3 hours/day)     Assistance Recommended at Discharge Intermittent Supervision/Assistance  Equipment Recommendations  None recommended by PT    Recommendations for Other Services       Precautions / Restrictions Precautions Precautions: Fall Restrictions Weight Bearing Restrictions: No     Mobility  Bed Mobility Overal bed mobility: Needs  Assistance Bed Mobility: Supine to Sit;Sit to Supine;Rolling Rolling: Max assist   Supine to sit: Max assist Sit to supine: Max assist   General bed mobility comments: slow labored movement, able to help pull self to sitting using LUE    Transfers                        Ambulation/Gait                   Stairs             Wheelchair Mobility    Modified Rankin (Stroke Patients Only)       Balance Overall balance assessment: Needs assistance Sitting-balance support: Feet supported;No upper extremity supported Sitting balance-Leahy Scale: Poor Sitting balance - Comments: frequent leaning backwards due to generalized weakness Postural control: Posterior lean                                  Cognition Arousal/Alertness: Awake/alert;Lethargic Behavior During Therapy: Anxious;Flat affect Overall Cognitive Status: No family/caregiver present to determine baseline cognitive functioning                                 General Comments: required repeated verbal/tactile cueing to follow instructions        Exercises      General Comments        Pertinent Vitals/Pain Pain Assessment: Faces Faces Pain Scale: Hurts little more Pain Location: bilateral feet with pressure Pain Descriptors / Indicators: Sore;Grimacing Pain Intervention(s): Limited activity within patient's tolerance;Monitored during session;Repositioned    Home Living  Prior Function            PT Goals (current goals can now be found in the care plan section) Acute Rehab PT Goals Patient Stated Goal: return home PT Goal Formulation: With patient Time For Goal Achievement: 07/18/21 Potential to Achieve Goals: Fair Progress towards PT goals: Progressing toward goals    Frequency    Min 3X/week      PT Plan Current plan remains appropriate    Co-evaluation              AM-PAC PT "6 Clicks"  Mobility   Outcome Measure  Help needed turning from your back to your side while in a flat bed without using bedrails?: A Lot Help needed moving from lying on your back to sitting on the side of a flat bed without using bedrails?: A Lot Help needed moving to and from a bed to a chair (including a wheelchair)?: Total Help needed standing up from a chair using your arms (e.g., wheelchair or bedside chair)?: Total Help needed to walk in hospital room?: Total Help needed climbing 3-5 steps with a railing? : Total 6 Click Score: 8    End of Session   Activity Tolerance: Patient tolerated treatment well;Patient limited by fatigue Patient left: in bed;with call bell/phone within reach Nurse Communication: Mobility status PT Visit Diagnosis: Other abnormalities of gait and mobility (R26.89);Muscle weakness (generalized) (M62.81);Unsteadiness on feet (R26.81)     Time: 4496-7591 PT Time Calculation (min) (ACUTE ONLY): 24 min  Charges:  $Neuromuscular Re-education: 23-37 mins                     12:04 PM, 07/05/21 Lonell Grandchild, MPT Physical Therapist with [facility] 336 [phone] office [phone] mobile phone

## 2021-07-06 DIAGNOSIS — N179 Acute kidney failure, unspecified: Secondary | ICD-10-CM | POA: Diagnosis not present

## 2021-07-06 DIAGNOSIS — I10 Essential (primary) hypertension: Secondary | ICD-10-CM | POA: Diagnosis not present

## 2021-07-06 DIAGNOSIS — Z7189 Other specified counseling: Secondary | ICD-10-CM | POA: Diagnosis not present

## 2021-07-06 DIAGNOSIS — G9341 Metabolic encephalopathy: Secondary | ICD-10-CM | POA: Diagnosis not present

## 2021-07-06 DIAGNOSIS — G20 Parkinson's disease: Secondary | ICD-10-CM | POA: Diagnosis not present

## 2021-07-06 DIAGNOSIS — N3289 Other specified disorders of bladder: Secondary | ICD-10-CM | POA: Diagnosis not present

## 2021-07-06 DIAGNOSIS — Z515 Encounter for palliative care: Secondary | ICD-10-CM | POA: Diagnosis not present

## 2021-07-06 LAB — RENAL FUNCTION PANEL
Albumin: 2.2 g/dL — ABNORMAL LOW (ref 3.5–5.0)
Anion gap: 15 (ref 5–15)
BUN: 118 mg/dL — ABNORMAL HIGH (ref 8–23)
CO2: 22 mmol/L (ref 22–32)
Calcium: 7.5 mg/dL — ABNORMAL LOW (ref 8.9–10.3)
Chloride: 105 mmol/L (ref 98–111)
Creatinine, Ser: 7.1 mg/dL — ABNORMAL HIGH (ref 0.61–1.24)
GFR, Estimated: 8 mL/min — ABNORMAL LOW (ref >60)
Glucose, Bld: 90 mg/dL (ref 70–99)
Phosphorus: 7.2 mg/dL — ABNORMAL HIGH (ref 2.5–4.6)
Potassium: 4 mmol/L (ref 3.5–5.1)
Sodium: 142 mmol/L (ref 135–145)

## 2021-07-06 LAB — BASIC METABOLIC PANEL
Anion gap: 15 (ref 5–15)
BUN: 120 mg/dL — ABNORMAL HIGH (ref 8–23)
CO2: 22 mmol/L (ref 22–32)
Calcium: 7.4 mg/dL — ABNORMAL LOW (ref 8.9–10.3)
Chloride: 105 mmol/L (ref 98–111)
Creatinine, Ser: 7.26 mg/dL — ABNORMAL HIGH (ref 0.61–1.24)
GFR, Estimated: 7 mL/min — ABNORMAL LOW (ref >60)
Glucose, Bld: 90 mg/dL (ref 70–99)
Potassium: 4 mmol/L (ref 3.5–5.1)
Sodium: 142 mmol/L (ref 135–145)

## 2021-07-06 LAB — CBC
HCT: 27.3 % — ABNORMAL LOW (ref 39.0–52.0)
Hemoglobin: 8.9 g/dL — ABNORMAL LOW (ref 13.0–17.0)
MCH: 32 pg (ref 26.0–34.0)
MCHC: 32.6 g/dL (ref 30.0–36.0)
MCV: 98.2 fL (ref 80.0–100.0)
Platelets: 218 10*3/uL (ref 150–400)
RBC: 2.78 MIL/uL — ABNORMAL LOW (ref 4.22–5.81)
RDW: 12.9 % (ref 11.5–15.5)
WBC: 9.5 10*3/uL (ref 4.0–10.5)
nRBC: 0 % (ref 0.0–0.2)

## 2021-07-06 LAB — CK: Total CK: 4714 U/L — ABNORMAL HIGH (ref 49–397)

## 2021-07-06 LAB — MAGNESIUM: Magnesium: 2.1 mg/dL (ref 1.7–2.4)

## 2021-07-06 NOTE — Progress Notes (Signed)
PROGRESS NOTE    Lucas [name]  YBO:[id number] DOB: 1946/06/04 DOA: 06/27/2021 PCP: Alfonse Flavors, MD    Subjective:  The patient was seen and examined this morning, very lethargic, going in and out of sleep, per nursing was able to take some of his medication today. Patient felt warm, but temp was 98.9, BP soft at 104/74 satting 94% on 2 L of oxygen  Chart review revealed patient was active with hospice at home. Family sisters are reluctant and not pursuing with hospice, comfort care.    Brief Narrative:   Lucas [name] is a 75 y.o. male with medical history significant of Parkinson's disease, prior CVA, COPD, tobacco use, hypertension, chronic lower extremity edema, hyperlipidemia, normally lives at home.  Family had brought him to ED due to what appears to be a chronic decline in his overall condition with confusion.  Patient was admitted for acute metabolic encephalopathy and associated acute kidney injury that appears to be due to rhabdomyolysis.  Patient continues to require aggressive IV fluids per nephrology.  Continue to monitor creatinine trend and follow for outcomes.  Nephrology and palliative care following.      Assessment & Plan:   Active Problems:   Parkinson's disease ([place])   Generalized weakness   Essential hypertension   Hyperlipidemia   COPD (chronic obstructive pulmonary disease) ([facility])   AKI (acute kidney injury) ([place])   Acute metabolic encephalopathy   Bladder mass   Pressure injury of skin   Acute kidney injury secondary to rhabdomyolysis -Continue conservative management -Appreciate nephrology following closely:  -BUN 108, 124, 130, 118 creatinine 8.4, 7.97, 8.02 >>7.26 today    Intake/Output Summary (Last 24 hours) at 07/06/2021 1259 Last data filed at 07/06/2021 1000 Gross per 24 hour  Intake 1162.48 ml  Output 3550 ml  Net -2387.52 ml     -Progressively declining   -Continue to have decreased p.o. intake in the setting  of ongoing diuretic use and lisinopril. -Imaging of the abdomen did not reveal any hydronephrosis  -Appreciate nephrology following; determined that patient is NOT a hemodialysis candidate by multiple nephrology input due to multiple call Reddys, patient is steadily declining in face of comorbidities Parkinson's dementia poor function nutritional status-very poor prognosis  Patient is physically and mentally declining - heading towards palliative care, hospice -Family has not come to terms with comfort care measures yet Anticipating in hospital death at this time    -IV fluids changed to LR per nephrology with sodium bicarbonate tablets added for AGMA -Continue aggressive IV fluid with decreased rate and repeat monitoring  -Foley per Nephrology to monitor UO   Transaminitis 2/2 rhabdomyolysis -Monitor closely, improving -Improving CK levels noted  Bladder outlet obstruction -Status post Foley catheter -monitoring strict I's and O's in setting of renal failure -Continue for now and will require outpatient urology evaluation for void trial   Acute metabolic encephalopathy -Excessively lethargic today -Likely related to progressive renal failure, medication including morphine in setting of AKI,  -ABG, TSH, and ammonia within normal limits -Per nephrology renal function would not improve any further and not a dialysis candidate -Holding sedating meds   Hypertension -Holding antihypertensives now since he was likely hypovolemic -Blood pressure remained soft   Parkinson's disease-along with dementia -On Sinemet --we may hold if unable to swallow   Hyperlipidemia -On statin -we may hold if continue to be encephalopathic unable to swallow   Bladder mass -Appears to have been present on CT imaging from 2019 and appears unchanged -  This can be followed outpatient by urology   COPD -No wheezing or shortness of breath at this time Stable on 2 L of oxygen, satting> 92% -Continue as  needed bronchodilators   Generalized weakness -last Physical therapy evaluation ordered 11/8 -Patient has a severe global weakness, per nursing staff able to move with a [place] SNF-comfort care, hospice, hospice home home   Goals of care -Palliative care and transition of care, on board, extensive family discussion regarding patient's goal of care, per nephrology and our evaluation patient is declining physically and mentally-poor prognosis, recommending comfort caremeasures with hospice -Family has agreed to DNR/DNI status -Family currently not on board with hospice -palliative care following diligently  -Reaching out to patient's son for further discussion  07/06/2021  -I have called his son POA Mr. Hayato Guaman who expresses understanding and agreement to above plan he is reaching out to his family member, brother before determining     DVT prophylaxis: Heparin Code Status: DNR Family Communication: Discussing with family to determine goal of care POA Mr. Belvin Gauss  Disposition Plan:  Status is: Inpatient   Remains inpatient appropriate because: Need for IV fluid.     Skin Assessment:   I have examined the patient's skin and I agree with the wound assessment as performed by the wound care RN as outlined below:   Pressure Injury 03/04/21 Sacrum Mid Stage 2 -  Partial thickness loss of dermis presenting as a shallow open injury with a red, pink wound bed without slough. (Active)  03/04/21 2245  Location: Sacrum  Location Orientation: Mid  Staging: Stage 2 -  Partial thickness loss of dermis presenting as a shallow open injury with a red, pink wound bed without slough.  Wound Description (Comments):   Present on Admission: Yes      Consultants:  Nephrology Palliative care   Procedures:  See below   Antimicrobials:  None  Objective: Vitals:   07/05/21 1223 07/05/21 2150 07/06/21 0440 07/06/21 0938  BP: (!) 138/51 (!) 127/51 (!) 122/43 104/74   Pulse: 75 80 80 84  Resp: 17 20 (!) 21 17  Temp: 98.1 F (36.7 C) 99.6 F (37.6 C) 99 F (37.2 C) 98.9 F (37.2 C)  TempSrc: Oral Oral  Oral  SpO2: 94% 95% 95% 94%  Weight:      Height:        Intake/Output Summary (Last 24 hours) at 07/06/2021 1259 Last data filed at 07/06/2021 1000 Gross per 24 hour  Intake 1162.48 ml  Output 3550 ml  Net -2387.52 ml   Filed Weights   06/27/21 1109 06/27/21 1546  Weight: 90.7 kg 81.2 kg       Physical Exam:   General:  Very lethargic this a.m.,  HEENT:  Normocephalic, PERRL, otherwise with in Normal limits   Neuro:  CNII-XII intact. , normal motor and sensation, reflexes intact   Lungs:   Clear to auscultation BL, Respirations unlabored, no wheezes / crackles  Cardio:    S1/S2, RRR, No murmure, No Rubs or Gallops   Abdomen:   Soft, non-tender, bowel sounds active all four quadrants,  no guarding or peritoneal signs.  Muscular skeletal:  Limited exam - in bed, able to move all 4 extremities, severe global generalized weaknesses 2+ pulses,  symmetric, +1 pitting edema  Skin:  Dry, warm to touch, negative for any Rashes,  Wounds: Please see nursing documentation  Pressure Injury 03/04/21 Sacrum Mid Stage 2 -  Partial thickness loss of dermis  presenting as a shallow open injury with a red, pink wound bed without slough. (Active)  03/04/21 2245  Location: Sacrum  Location Orientation: Mid  Staging: Stage 2 -  Partial thickness loss of dermis presenting as a shallow open injury with a red, pink wound bed without slough.  Wound Description (Comments):   Present on Admission: Yes     Pressure Injury 06/28/21 Heel Left Deep Tissue Pressure Injury - Purple or maroon localized area of discolored intact skin or blood-filled blister due to damage of underlying soft tissue from pressure and/or shear. (Active)  06/28/21 0800  Location: Heel  Location Orientation: Left  Staging: Deep Tissue Pressure Injury - Purple or maroon localized area of  discolored intact skin or blood-filled blister due to damage of underlying soft tissue from pressure and/or shear.  Wound Description (Comments):   Present on Admission: Yes     Pressure Injury 06/28/21 Buttocks Left Deep Tissue Pressure Injury - Purple or maroon localized area of discolored intact skin or blood-filled blister due to damage of underlying soft tissue from pressure and/or shear. (Active)  06/28/21 0800  Location: Buttocks  Location Orientation: Left  Staging: Deep Tissue Pressure Injury - Purple or maroon localized area of discolored intact skin or blood-filled blister due to damage of underlying soft tissue from pressure and/or shear.  Wound Description (Comments):   Present on Admission: Yes     Pressure Injury 07/02/21 Rectum Mid Stage 2 -  Partial thickness loss of dermis presenting as a shallow open injury with a red, pink wound bed without slough. skin red, open, small amount of drainage (Active)  07/02/21 0845  Location: Rectum  Location Orientation: Mid  Staging: Stage 2 -  Partial thickness loss of dermis presenting as a shallow open injury with a red, pink wound bed without slough.  Wound Description (Comments): skin red, open, small amount of drainage  Present on Admission:      Foley catheter required for strict I's and O's, as patient going to renal failure        Data Reviewed: I have personally reviewed following labs and imaging studies  CBC: Recent Labs  Lab 06/30/21 0406 07/01/21 0431 07/02/21 0413 07/05/21 0445 07/06/21 0549  WBC 12.0* 12.6* 11.2* 9.3 9.5  HGB 11.3* 11.5* 11.7* 9.9* 8.9*  HCT 34.5* 35.7* 36.5* 29.1* 27.3*  MCV 98.9 98.9 99.5 96.7 98.2  PLT 158 170 167 198 979   Basic Metabolic Panel: Recent Labs  Lab 07/02/21 0413 07/03/21 0339 07/04/21 0516 07/05/21 0445 07/06/21 0549  NA 137 137 137 139 142  142  K 5.4* 4.8 4.8 4.4 4.0  4.0  CL 102 102 101 100 105  105  CO2 20* 22 24 23 22  22   GLUCOSE 86 86 99 88 90  90   BUN 116* 108* 124* 130* 120*  118*  CREATININE 8.17* 8.40* 7.97* 8.02* 7.26*  7.10*  CALCIUM 7.1* 6.6* 7.0* 7.1* 7.4*  7.5*  MG 2.3 2.1 2.1 2.1 2.1  PHOS  --   --   --  7.8* 7.2*   GFR: Estimated Creatinine Clearance: 9 mL/min (A) (by C-G formula based on SCr of 7.1 mg/dL (H)). Liver Function Tests: Recent Labs  Lab 06/30/21 0406 07/01/21 0431 07/02/21 0413 07/03/21 0339 07/04/21 0516 07/05/21 0445 07/06/21 0549  AST 538* 386* 294* 142* 96*  --   --   ALT 17 16 20 12 13   --   --   ALKPHOS 56 55 66 80 87  --   --  BILITOT 1.2 0.8 1.0 0.8 0.8  --   --   PROT 5.3* 5.3* 5.3* 4.6* 5.1*  --   --   ALBUMIN 2.2* 2.2* 2.2* 1.8* 2.0* 2.0* 2.2*   No results for input(s): LIPASE, AMYLASE in the last 168 hours. No results for input(s): AMMONIA in the last 168 hours.  Coagulation Profile: No results for input(s): INR, PROTIME in the last 168 hours. Cardiac Enzymes: Recent Labs  Lab 07/02/21 0413 07/03/21 0339 07/04/21 0516 07/05/21 0445 07/06/21 0549  CKTOTAL 24,350* 13,734* 10,131* 6,250* 4,714*   BNP (last 3 results) No results for input(s): PROBNP in the last 8760 hours. HbA1C: No results for input(s): HGBA1C in the last 72 hours. CBG: No results for input(s): GLUCAP in the last 168 hours. Lipid Profile: No results for input(s): CHOL, HDL, LDLCALC, TRIG, CHOLHDL, LDLDIRECT in the last 72 hours. Thyroid Function Tests: No results for input(s): TSH, T4TOTAL, FREET4, T3FREE, THYROIDAB in the last 72 hours. Anemia Panel: No results for input(s): VITAMINB12, FOLATE, FERRITIN, TIBC, IRON, RETICCTPCT in the last 72 hours. Sepsis Labs: No results for input(s): PROCALCITON, LATICACIDVEN in the last 168 hours.  Recent Results (from the past 240 hour(s))  Resp Panel by RT-PCR (Flu A&B, Covid) Nasopharyngeal Swab     Status: None   Collection Time: 06/27/21 12:07 PM   Specimen: Nasopharyngeal Swab; Nasopharyngeal(NP) swabs in vial transport medium  Result Value Ref Range  Status   SARS Coronavirus 2 by RT PCR NEGATIVE NEGATIVE Final    Comment: (NOTE) SARS-CoV-2 target nucleic acids are NOT DETECTED.  The SARS-CoV-2 RNA is generally detectable in upper respiratory specimens during the acute phase of infection. The lowest concentration of SARS-CoV-2 viral copies this assay can detect is 138 copies/mL. A negative result does not preclude SARS-Cov-2 infection and should not be used as the sole basis for treatment or other patient management decisions. A negative result may occur with  improper specimen collection/handling, submission of specimen other than nasopharyngeal swab, presence of viral mutation(s) within the areas targeted by this assay, and inadequate number of viral copies(<138 copies/mL). A negative result must be combined with clinical observations, patient history, and epidemiological information. The expected result is Negative.  Fact Sheet for Patients:  EntrepreneurPulse.com.au  Fact Sheet for Healthcare Providers:  IncredibleEmployment.be  This test is no t yet approved or cleared by the [place] FDA and  has been authorized for detection and/or diagnosis of SARS-CoV-2 by FDA under an Emergency Use Authorization (EUA). This EUA will remain  in effect (meaning this test can be used) for the duration of the COVID-19 declaration under Section 564(b)(1) of the Act, 21 U.S.C.section 360bbb-3(b)(1), unless the authorization is terminated  or revoked sooner.       Influenza A by PCR NEGATIVE NEGATIVE Final   Influenza B by PCR NEGATIVE NEGATIVE Final    Comment: (NOTE) The Xpert Xpress SARS-CoV-2/FLU/RSV plus assay is intended as an aid in the diagnosis of influenza from Nasopharyngeal swab specimens and should not be used as a sole basis for treatment. Nasal washings and aspirates are unacceptable for Xpert Xpress SARS-CoV-2/FLU/RSV testing.  Fact Sheet for  Patients: EntrepreneurPulse.com.au  Fact Sheet for Healthcare Providers: IncredibleEmployment.be  This test is not yet approved or cleared by the [place] FDA and has been authorized for detection and/or diagnosis of SARS-CoV-2 by FDA under an Emergency Use Authorization (EUA). This EUA will remain in effect (meaning this test can be used) for the duration of the COVID-19 declaration under Section  564(b)(1) of the Act, 21 U.S.C. section 360bbb-3(b)(1), unless the authorization is terminated or revoked.  Performed at [facility], [address] [place],  [address]          Radiology Studies: US SCROTUM  Result Date: 07/05/2021 CLINICAL DATA:  Edema EXAM: ULTRASOUND OF SCROTUM TECHNIQUE: Complete ultrasound examination of the testicles, epididymis, and other scrotal structures was performed. COMPARISON:  None. FINDINGS: Right testicle Measurements: 4.5 x 2.9 x 2.2 cm. No mass or microlithiasis visualized. Left testicle Measurements: 4 x 2.7 x 3 cm. No mass or microlithiasis visualized. Right epididymis:  Normal size.  7 mm epididymal head cyst. Left epididymis:  Normal in size and appearance. Hydrocele: Moderate right hydrocele. Moderate to large left hydrocele. Varicocele:  None visualized. Diffuse scrotal skin thickening and subcutaneous edema with no focal collection identified. IMPRESSION: 1. No testicular mass identified. 2. Bilateral moderate to large hydroceles. 3. Diffuse scrotal skin thickening and subcutaneous edema with no focal collection identified. 4. Right epididymal head cyst. Electronically Signed   By: Ofilia Neas M.D.   On: 07/05/2021 11:56        Scheduled Meds:  ALPRAZolam  1 mg Oral QHS   aspirin EC  81 mg Oral Daily   carbidopa-levodopa  1 tablet Oral TID   Chlorhexidine Gluconate Cloth  6 each Topical Daily   gabapentin  100 mg Oral BID   heparin  5,000 Units Subcutaneous Q8H   nystatin   Topical BID    sodium bicarbonate  1,300 mg Oral TID   Continuous Infusions:  albumin human 12.5 g (07/06/21 1010)     LOS: 8 days    Time spent: 35 minutes    Deatra James, MD [facility]  If 7PM-7AM, please contact night-coverage [email or web address] 07/06/2021, 12:59 PM

## 2021-07-06 NOTE — Plan of Care (Signed)
  Problem: Acute Rehab OT Goals (only OT should resolve) Goal: Pt. Will Perform Eating Flowsheets (Taken 07/06/2021 0911) Pt Will Perform Eating:  with mod assist  with adaptive utensils  bed level Goal: Pt. Will Perform Grooming Flowsheets (Taken 07/06/2021 0911) Pt Will Perform Grooming:  with max assist  with mod assist  bed level  with adaptive equipment Goal: Pt. Will Perform Upper Body Dressing Flowsheets (Taken 07/06/2021 0911) Pt Will Perform Upper Body Dressing:  with mod assist  with max assist  bed level Goal: Pt. Will Perform Lower Body Dressing Flowsheets (Taken 07/06/2021 0911) Pt Will Perform Lower Body Dressing:  with mod assist  with max assist  bed level  sitting/lateral leans  with adaptive equipment Goal: Pt. Will Transfer To Toilet [place] (Taken 07/06/2021 0911) Pt Will Transfer to Toilet:  with max assist  with mod assist  squat pivot transfer  stand pivot transfer  bedside commode Goal: Pt/Caregiver Will Perform Home Exercise Program Flowsheets (Taken 07/06/2021 0911) Pt/caregiver will Perform Home Exercise Program:  Increased ROM  Both right and left upper extremity  With minimal assist  Brookelyn Gaynor OT, MOT

## 2021-07-06 NOTE — Progress Notes (Signed)
Made visit today with patient sister as patient was in/out of sleep. Chaplain engaged her in reflection around her brothers' decline and reflected on his life and relationships. She stated that she understands that her brother is dying but is still hesitant for comfort care or Hospice and states that patients sons are the decision makers. Chaplain will remain available in order to provide spiritual support and to assess for spiritual need.   Rev. Bennie Pierini, M.Div [phone]

## 2021-07-06 NOTE — Progress Notes (Signed)
Palliative: Thorough chart review completed.  [name] remains acutely ill.  Although alert at times, I am not sure that he can make his basic needs known.  Call to son, Lucas [name].  At this point, Lucas [name] shares that he and his brother have had a discussion, and they would like for Lucas [name] to continue to stay in the hospital receiving care with a goal of recovery.  He shares that the family would like short-term rehab if insurance authorization can be obtained.  Transition of care team is working for this.  Conference with attending, bedside nursing staff, chaplain, transition of care team related to patient condition, needs, goals of care, disposition.  Plan: Continue to treat the treatable but no CPR or intubation.  Family would like to continue maximal medical treatment with a goal of recovery if possible.  The goal is short-term rehab with the possibility of transitioning to long-term care.  54 minutes Lucas Axe, NP Palliative medicine team Team phone [phone] Greater than 50% of this time was spent counseling and coordinating care related to the above assessment and plan.

## 2021-07-06 NOTE — Progress Notes (Signed)
Patient ID: Lucas [name], male   DOB: Aug 13, 1946, 75 y.o.   MRN: [id number] S: remains drowsy but easily arousable. Urine output ~3.1L. no acute events O:BP (!) 122/43 (BP Location: Right Arm)   Pulse 80   Temp 99 F (37.2 C)   Resp (!) 21   Ht 5\' 8"  (1.727 m)   Wt 81.2 kg   SpO2 95%   BMI 27.22 kg/m   Intake/Output Summary (Last 24 hours) at 07/06/2021 0936 Last data filed at 07/06/2021 0500 Gross per 24 hour  Intake 1042.48 ml  Output 3150 ml  Net -2107.52 ml   Intake/Output: I/O last 3 completed shifts: In: 1638.5 [P.O.:956; I.V.:643.1; IV Piggyback:39.4] Out: 4650 [Urine:4650]  Intake/Output this shift:  No intake/output data recorded. Weight change:  Gen: drowsy but arousable CVS: rrr Resp: occ rhonchi, 2LNC, no iwob Abd: +BS, soft, NT/ND Ext: 1+ BLE edema, + scrotal edema  Recent Labs  Lab 06/30/21 0406 07/01/21 0431 07/02/21 0413 07/03/21 0339 07/04/21 0516 07/05/21 0445 07/06/21 0549  NA 139 138 137 137 137 139 142  142  K 4.8 5.1 5.4* 4.8 4.8 4.4 4.0  4.0  CL 105 104 102 102 101 100 105  105  CO2 20* 20* 20* 22 24 23 22  22   GLUCOSE 83 107* 86 86 99 88 90  90  BUN 80* 118* 116* 108* 124* 130* 120*  118*  CREATININE 7.31* 8.26* 8.17* 8.40* 7.97* 8.02* 7.26*  7.10*  ALBUMIN 2.2* 2.2* 2.2* 1.8* 2.0* 2.0* 2.2*  CALCIUM 7.0* 6.9* 7.1* 6.6* 7.0* 7.1* 7.4*  7.5*  PHOS  --   --   --   --   --  7.8* 7.2*  AST 538* 386* 294* 142* 96*  --   --   ALT 17 16 20 12 13   --   --    Liver Function Tests: Recent Labs  Lab 07/02/21 0413 07/03/21 0339 07/04/21 0516 07/05/21 0445 07/06/21 0549  AST 294* 142* 96*  --   --   ALT 20 12 13   --   --   ALKPHOS 66 80 87  --   --   BILITOT 1.0 0.8 0.8  --   --   PROT 5.3* 4.6* 5.1*  --   --   ALBUMIN 2.2* 1.8* 2.0* 2.0* 2.2*   No results for input(s): LIPASE, AMYLASE in the last 168 hours. No results for input(s): AMMONIA in the last 168 hours. CBC: Recent Labs  Lab 06/30/21 0406 07/01/21 0431  07/02/21 0413 07/05/21 0445 07/06/21 0549  WBC 12.0* 12.6* 11.2* 9.3 9.5  HGB 11.3* 11.5* 11.7* 9.9* 8.9*  HCT 34.5* 35.7* 36.5* 29.1* 27.3*  MCV 98.9 98.9 99.5 96.7 98.2  PLT 158 170 167 198 218   Cardiac Enzymes: Recent Labs  Lab 07/02/21 0413 07/03/21 0339 07/04/21 0516 07/05/21 0445 07/06/21 0549  CKTOTAL 24,350* 13,734* 10,131* 6,250* 4,714*   CBG: No results for input(s): GLUCAP in the last 168 hours.  Iron Studies: No results for input(s): IRON, TIBC, TRANSFERRIN, FERRITIN in the last 72 hours. Studies/Results: US SCROTUM  Result Date: 07/05/2021 CLINICAL DATA:  Edema EXAM: ULTRASOUND OF SCROTUM TECHNIQUE: Complete ultrasound examination of the testicles, epididymis, and other scrotal structures was performed. COMPARISON:  None. FINDINGS: Right testicle Measurements: 4.5 x 2.9 x 2.2 cm. No mass or microlithiasis visualized. Left testicle Measurements: 4 x 2.7 x 3 cm. No mass or microlithiasis visualized. Right epididymis:  Normal size.  7 mm epididymal head cyst. Left epididymis:  Normal in size and appearance. Hydrocele: Moderate right hydrocele. Moderate to large left hydrocele. Varicocele:  None visualized. Diffuse scrotal skin thickening and subcutaneous edema with no focal collection identified. IMPRESSION: 1. No testicular mass identified. 2. Bilateral moderate to large hydroceles. 3. Diffuse scrotal skin thickening and subcutaneous edema with no focal collection identified. 4. Right epididymal head cyst. Electronically Signed   By: Ofilia Neas M.D.   On: 07/05/2021 11:56    ALPRAZolam  1 mg Oral QHS   aspirin EC  81 mg Oral Daily   carbidopa-levodopa  1 tablet Oral TID   Chlorhexidine Gluconate Cloth  6 each Topical Daily   gabapentin  100 mg Oral BID   heparin  5,000 Units Subcutaneous Q8H   nystatin   Topical BID   sodium bicarbonate  1,300 mg Oral TID    BMET    Component Value Date/Time   NA 142 07/06/2021 0549   NA 142 07/06/2021 0549   K 4.0  07/06/2021 0549   K 4.0 07/06/2021 0549   CL 105 07/06/2021 0549   CL 105 07/06/2021 0549   CO2 22 07/06/2021 0549   CO2 22 07/06/2021 0549   GLUCOSE 90 07/06/2021 0549   GLUCOSE 90 07/06/2021 0549   BUN 118 (H) 07/06/2021 0549   BUN 120 (H) 07/06/2021 0549   CREATININE 7.10 (H) 07/06/2021 0549   CREATININE 7.26 (H) 07/06/2021 0549   CREATININE 0.96 06/24/2017 1155   CALCIUM 7.5 (L) 07/06/2021 0549   CALCIUM 7.4 (L) 07/06/2021 0549   GFRNONAA 8 (L) 07/06/2021 0549   GFRNONAA 7 (L) 07/06/2021 0549   GFRNONAA 80 06/24/2017 1155   GFRAA 59 (L) 05/09/2020 2123   GFRAA 93 06/24/2017 1155   CBC    Component Value Date/Time   WBC 9.5 07/06/2021 0549   RBC 2.78 (L) 07/06/2021 0549   HGB 8.9 (L) 07/06/2021 0549   HCT 27.3 (L) 07/06/2021 0549   PLT 218 07/06/2021 0549   MCV 98.2 07/06/2021 0549   MCH 32.0 07/06/2021 0549   MCHC 32.6 07/06/2021 0549   RDW 12.9 07/06/2021 0549   LYMPHSABS 2.2 06/09/2021 0021   MONOABS 0.6 06/09/2021 0021   EOSABS 0.2 06/09/2021 0021   BASOSABS 0.1 06/09/2021 0021    Assessment/Plan:   AKI/CKD stage IIIa - multifactorial in setting of rhabdomyolysis, prolonged re-renal injury in setting of solitary kidney as well as lasix, HCTZ, and ACE-inhibition.  Also with urinary retention therefore foley placed (likely related to Parkinsons). He is not a suitable candidate for dialysis given his poor functional and nutritional status as well as his multiple co-morbidities.  Recommend conservative management for now and appreciate Palliative care's assistance Slight improvement in kidney function  IVF's decreased due to scrotal edema, however BUN continues to climb and he is negative around 2L today, net pos ~10.1L since admit. Since he is on minimal fluids and still has swelling, will stop fluids for today. Encourage PO intake/hydration Continue with conservative management.  Appreciate Palliative care's assistance. Continue GOC discussions Rhabdomyolysis -  unknown time down at home.  CK levels improving with IVF's. Stopping IVF as above AMS - likely combination of meds and AKI. gabapentin dec'd to 100 mg bid and follow. Per primary Parkinson's - apparently poor functional status and is incontinent at home.  Palliative care following. HTN - off ace and diuretics for now. BP low.  Can try for IV albumin if worsening Hyperkalemia - improved with lokelma.  Continue to follow. AGMA - on bicarb-stable/improved Bladder  mass- unclear if this is malignancy or stone. Will need outpatient folley BOO- marked UOP after placement of Foley (900 cc's after placement).  Likely due to neurogenic bladder.  Continue with indwelling foley catheter and will need outpatient urologic evaluation. Dispositon - poor overall prognosis given co-morbidities (Parkinson's, dementia), poor functional and nutritional status with FTT.  Appreciate Palliative care's input.  Pt is now DNR.   Gean Quint, MD [facility]

## 2021-07-06 NOTE — Evaluation (Signed)
Occupational Therapy Evaluation Patient Details Name: Lucas [name] MRN: [id number] DOB: 1946-04-12 Today's Date: 07/06/2021   History of Present Illness Lucas [name] is a 75 y.o. male presents with chronic decline in his overall condition with confusion. Pt admitted for acute metabolic encephalopathy and associated acute kidney injury that appears to be due to rhabdomyolysis. PMH: Parkinson's disease, prior CVA, COPD, tobacco use, HTN, chronic lower extremity edema, hyperlipidemia   Clinical Impression   Pt lethargic with eyes closed for duration of session. Pt communicated minimally with slurred speech when pt did communicate. Pt required max A for supine to sit bed mobility with HOB raised. Pt demonstrates poor sitting balance with constant posterior lean. Little to no functional use demonstrated from bilateral UE with the exception of grip and mild movement at elbow. Pt left in bed with call bell within reach. Pt will benefit from continued OT in the hospital and recommended venue below to increase strength, balance, and endurance for safe ADL's.        Recommendations for follow up therapy are one component of a multi-disciplinary discharge planning process, led by the attending physician.  Recommendations may be updated based on patient status, additional functional criteria and insurance authorization.   Follow Up Recommendations  Skilled nursing-short term rehab (<3 hours/day)    Assistance Recommended at Discharge Frequent or constant Supervision/Assistance  Functional Status Assessment  Patient has had a recent decline in their functional status and/or demonstrates limited ability to make significant improvements in function in a reasonable and predictable amount of time  Equipment Recommendations       Recommendations for Other Services       Precautions / Restrictions Precautions Precautions: Fall Restrictions Weight Bearing Restrictions: No      Mobility Bed  Mobility Overal bed mobility: Needs Assistance Bed Mobility: Supine to Sit;Sit to Supine;Rolling Rolling: Max assist   Supine to sit: Max assist Sit to supine: Max assist   General bed mobility comments: labored movement with little pt assist for mobility    Transfers                          Balance Overall balance assessment: Needs assistance Sitting-balance support: Feet supported;No upper extremity supported Sitting balance-Leahy Scale: Poor Sitting balance - Comments: frequent leaning backwards due to generalized weakness Postural control: Posterior lean                                 ADL either performed or assessed with clinical judgement   ADL Overall ADL's : Needs assistance/impaired                     Lower Body Dressing: Total assistance;Bed level Lower Body Dressing Details (indicate cue type and reason): total assist to don socks               General ADL Comments: Pt demonstrates little functional use of B UE. Pt likely needing max to total assist at this time for ADL's.     Vision Patient Visual Report: Other (comment) (Unsure at this time due to pt cognitive status. Eyes closed majority of session.) Vision Assessment?:  (Eyes closed majority of session.)                Pertinent Vitals/Pain Pain Assessment: Faces Faces Pain Scale: Hurts little more Pain Location: Bilateral LE with bed mobility Pain Descriptors /  Indicators: Sore;Grimacing Pain Intervention(s): Limited activity within patient's tolerance;Monitored during session;Repositioned     Hand Dominance     Extremity/Trunk Assessment Upper Extremity Assessment Upper Extremity Assessment: RUE deficits/detail;LUE deficits/detail RUE Deficits / Details: 3+/5 grip; no active use at shoulder; P/ROM of shoulder flexion and abduction WFL. Difficulty to fully assess, possible tone vs pt resisting R elbow flexion and extension. RUE Coordination: decreased fine  motor;decreased gross motor LUE Deficits / Details: 4+/5 grip MMT;  P/ROM of shoulder flexion and abduction WFL. Minimal limitaions in elbow flexion. None noted in extension. LUE Coordination: decreased fine motor;decreased gross motor   Lower Extremity Assessment Lower Extremity Assessment: Defer to PT evaluation       Communication Communication Communication: Expressive difficulties (slurred speech, minimal communication)   Cognition Arousal/Alertness: Lethargic Behavior During Therapy: Anxious;Flat affect Overall Cognitive Status: No family/caregiver present to determine baseline cognitive functioning                                 General Comments: reported fear of falling                      [place] expects to be discharged to:: Skilled nursing facility Living Arrangements: Alone                               Additional Comments: Per review of PT note, pt lives alone and family checks on him throughout the day.      Prior Functioning/Environment Prior Level of Function : Patient poor historian/Family not available             Mobility Comments: per chart review, states pt transferring self to w/c and mobilizing around home in w/c but hasn't for a few days due to weakness          OT Problem List: Decreased strength;Decreased range of motion;Impaired balance (sitting and/or standing);Decreased activity tolerance;Impaired vision/perception;Decreased cognition;Decreased coordination;Impaired tone;Obesity;Impaired UE functional use      OT Treatment/Interventions: Self-care/ADL training;Therapeutic exercise;Therapeutic activities;Neuromuscular education;Energy conservation;Manual therapy;Cognitive remediation/compensation;Visual/perceptual remediation/compensation;Patient/family education;Balance training    OT Goals(Current goals can be found in the care plan section) Acute Rehab OT Goals Patient Stated Goal:  none stated; pt reported "ok" when informed of rehab recommendation. OT Goal Formulation: Patient unable to participate in goal setting Time For Goal Achievement: 07/20/21 Potential to Achieve Goals: Fair  OT Frequency: Min 2X/week    End of Session Equipment Utilized During Treatment: Oxygen (2 LPM)  Activity Tolerance: Patient limited by lethargy Patient left: in bed;with call bell/phone within reach  OT Visit Diagnosis: Unsteadiness on feet (R26.81);Other abnormalities of gait and mobility (R26.89);Muscle weakness (generalized) (M62.81);Other symptoms and signs involving the nervous system (R29.898);Other symptoms and signs involving cognitive function                Time: 1572-6203 OT Time Calculation (min): 14 min Charges:  OT General Charges $OT Visit: 1 Visit OT Evaluation $OT Eval Moderate Complexity: 1 Mod  Zaccai Chavarin OT, MOT  Larey Seat 07/06/2021, 9:09 AM

## 2021-07-07 ENCOUNTER — Inpatient Hospital Stay (HOSPITAL_COMMUNITY): Payer: Medicare Other

## 2021-07-07 DIAGNOSIS — N179 Acute kidney failure, unspecified: Secondary | ICD-10-CM | POA: Diagnosis not present

## 2021-07-07 DIAGNOSIS — G9341 Metabolic encephalopathy: Secondary | ICD-10-CM | POA: Diagnosis not present

## 2021-07-07 DIAGNOSIS — I10 Essential (primary) hypertension: Secondary | ICD-10-CM

## 2021-07-07 DIAGNOSIS — R531 Weakness: Secondary | ICD-10-CM | POA: Diagnosis not present

## 2021-07-07 LAB — BASIC METABOLIC PANEL
Anion gap: 13 (ref 5–15)
BUN: 116 mg/dL — ABNORMAL HIGH (ref 8–23)
CO2: 24 mmol/L (ref 22–32)
Calcium: 7.9 mg/dL — ABNORMAL LOW (ref 8.9–10.3)
Chloride: 109 mmol/L (ref 98–111)
Creatinine, Ser: 6.13 mg/dL — ABNORMAL HIGH (ref 0.61–1.24)
GFR, Estimated: 9 mL/min — ABNORMAL LOW (ref >60)
Glucose, Bld: 93 mg/dL (ref 70–99)
Potassium: 3.7 mmol/L (ref 3.5–5.1)
Sodium: 146 mmol/L — ABNORMAL HIGH (ref 135–145)

## 2021-07-07 LAB — CBC
HCT: 28.3 % — ABNORMAL LOW (ref 39.0–52.0)
Hemoglobin: 9.2 g/dL — ABNORMAL LOW (ref 13.0–17.0)
MCH: 32.3 pg (ref 26.0–34.0)
MCHC: 32.5 g/dL (ref 30.0–36.0)
MCV: 99.3 fL (ref 80.0–100.0)
Platelets: 243 10*3/uL (ref 150–400)
RBC: 2.85 MIL/uL — ABNORMAL LOW (ref 4.22–5.81)
RDW: 12.8 % (ref 11.5–15.5)
WBC: 10.4 10*3/uL (ref 4.0–10.5)
nRBC: 0 % (ref 0.0–0.2)

## 2021-07-07 LAB — MAGNESIUM: Magnesium: 2.1 mg/dL (ref 1.7–2.4)

## 2021-07-07 LAB — CK: Total CK: 3759 U/L — ABNORMAL HIGH (ref 49–397)

## 2021-07-07 MED ORDER — FUROSEMIDE 10 MG/ML IJ SOLN: 40.0000 mg | Freq: Once | INTRAMUSCULAR | Status: DC

## 2021-07-07 NOTE — Progress Notes (Signed)
Patient ID: Lucas [name], male   DOB: Apr 07, 1946, 75 y.o.   MRN: [id number] S: remains delirious. Repeating 'let's go'. Discussed with son Audry Pili at bedside. Has been eating a little more, drinking adequate amount of water (confirmed with RN). UOP ~3.5L. no acute events O:BP (!) 140/49 (BP Location: Right Arm)   Pulse 76   Temp 99 F (37.2 C)   Resp 20   Ht 5\' 8"  (1.727 m)   Wt 81.2 kg   SpO2 92%   BMI 27.22 kg/m   Intake/Output Summary (Last 24 hours) at 07/07/2021 1005 Last data filed at 07/07/2021 0606 Gross per 24 hour  Intake 180 ml  Output 2550 ml  Net -2370 ml   Intake/Output: I/O last 3 completed shifts: In: 982.5 [P.O.:300; I.V.:643.1; IV Piggyback:39.4] Out: 5100 [Urine:5100]  Intake/Output this shift:  No intake/output data recorded. Weight change:  Gen: drowsy, delirious CVS: rrr Resp: cta bl, on 2LNC Abd: +BS, soft, NT/ND Ext: trace to 1+ BLE edema, + scrotal edema  Recent Labs  Lab 07/01/21 0431 07/02/21 0413 07/03/21 0339 07/04/21 0516 07/05/21 0445 07/06/21 0549 07/07/21 0431  NA 138 137 137 137 139 142  142 146*  K 5.1 5.4* 4.8 4.8 4.4 4.0  4.0 3.7  CL 104 102 102 101 100 105  105 109  CO2 20* 20* 22 24 23 22  22 24   GLUCOSE 107* 86 86 99 88 90  90 93  BUN 118* 116* 108* 124* 130* 120*  118* 116*  CREATININE 8.26* 8.17* 8.40* 7.97* 8.02* 7.26*  7.10* 6.13*  ALBUMIN 2.2* 2.2* 1.8* 2.0* 2.0* 2.2*  --   CALCIUM 6.9* 7.1* 6.6* 7.0* 7.1* 7.4*  7.5* 7.9*  PHOS  --   --   --   --  7.8* 7.2*  --   AST 386* 294* 142* 96*  --   --   --   ALT 16 20 12 13   --   --   --    Liver Function Tests: Recent Labs  Lab 07/02/21 0413 07/03/21 0339 07/04/21 0516 07/05/21 0445 07/06/21 0549  AST 294* 142* 96*  --   --   ALT 20 12 13   --   --   ALKPHOS 66 80 87  --   --   BILITOT 1.0 0.8 0.8  --   --   PROT 5.3* 4.6* 5.1*  --   --   ALBUMIN 2.2* 1.8* 2.0* 2.0* 2.2*   No results for input(s): LIPASE, AMYLASE in the last 168 hours. No results for  input(s): AMMONIA in the last 168 hours. CBC: Recent Labs  Lab 07/01/21 0431 07/02/21 0413 07/05/21 0445 07/06/21 0549 07/07/21 0431  WBC 12.6* 11.2* 9.3 9.5 10.4  HGB 11.5* 11.7* 9.9* 8.9* 9.2*  HCT 35.7* 36.5* 29.1* 27.3* 28.3*  MCV 98.9 99.5 96.7 98.2 99.3  PLT 170 167 198 218 243   Cardiac Enzymes: Recent Labs  Lab 07/03/21 0339 07/04/21 0516 07/05/21 0445 07/06/21 0549 07/07/21 0431  CKTOTAL 13,734* 10,131* 6,250* 4,714* 3,759*   CBG: No results for input(s): GLUCAP in the last 168 hours.  Iron Studies: No results for input(s): IRON, TIBC, TRANSFERRIN, FERRITIN in the last 72 hours. Studies/Results: US SCROTUM  Result Date: 07/05/2021 CLINICAL DATA:  Edema EXAM: ULTRASOUND OF SCROTUM TECHNIQUE: Complete ultrasound examination of the testicles, epididymis, and other scrotal structures was performed. COMPARISON:  None. FINDINGS: Right testicle Measurements: 4.5 x 2.9 x 2.2 cm. No mass or microlithiasis visualized. Left testicle Measurements:  4 x 2.7 x 3 cm. No mass or microlithiasis visualized. Right epididymis:  Normal size.  7 mm epididymal head cyst. Left epididymis:  Normal in size and appearance. Hydrocele: Moderate right hydrocele. Moderate to large left hydrocele. Varicocele:  None visualized. Diffuse scrotal skin thickening and subcutaneous edema with no focal collection identified. IMPRESSION: 1. No testicular mass identified. 2. Bilateral moderate to large hydroceles. 3. Diffuse scrotal skin thickening and subcutaneous edema with no focal collection identified. 4. Right epididymal head cyst. Electronically Signed   By: Ofilia Neas M.D.   On: 07/05/2021 11:56    ALPRAZolam  1 mg Oral QHS   aspirin EC  81 mg Oral Daily   carbidopa-levodopa  1 tablet Oral TID   Chlorhexidine Gluconate Cloth  6 each Topical Daily   gabapentin  100 mg Oral BID   heparin  5,000 Units Subcutaneous Q8H   nystatin   Topical BID   sodium bicarbonate  1,300 mg Oral TID    BMET     Component Value Date/Time   NA 146 (H) 07/07/2021 0431   K 3.7 07/07/2021 0431   CL 109 07/07/2021 0431   CO2 24 07/07/2021 0431   GLUCOSE 93 07/07/2021 0431   BUN 116 (H) 07/07/2021 0431   CREATININE 6.13 (H) 07/07/2021 0431   CREATININE 0.96 06/24/2017 1155   CALCIUM 7.9 (L) 07/07/2021 0431   GFRNONAA 9 (L) 07/07/2021 0431   GFRNONAA 80 06/24/2017 1155   GFRAA 59 (L) 05/09/2020 2123   GFRAA 93 06/24/2017 1155   CBC    Component Value Date/Time   WBC 10.4 07/07/2021 0431   RBC 2.85 (L) 07/07/2021 0431   HGB 9.2 (L) 07/07/2021 0431   HCT 28.3 (L) 07/07/2021 0431   PLT 243 07/07/2021 0431   MCV 99.3 07/07/2021 0431   MCH 32.3 07/07/2021 0431   MCHC 32.5 07/07/2021 0431   RDW 12.8 07/07/2021 0431   LYMPHSABS 2.2 06/09/2021 0021   MONOABS 0.6 06/09/2021 0021   EOSABS 0.2 06/09/2021 0021   BASOSABS 0.1 06/09/2021 0021    Assessment/Plan:   AKI/CKD stage IIIa - multifactorial in setting of rhabdomyolysis, prolonged pre-renal injury in setting of solitary kidney as well as lasix, HCTZ, and ACE-inhibition.  Also with urinary retention therefore foley placed (likely related to Parkinsons). He is not a suitable candidate for dialysis given his poor functional and nutritional status as well as his multiple co-morbidities.  Recommend conservative management for now and appreciate Palliative care's assistance IVF stopped 11/10 given scrotal edema and b/l LE edema. Cr improved to 6.1 today, adequate urine ouput. BUN remains elevated. Can diurese intermittently if needed, do not feel as if he needs any diuresis today. Discussed at length with Audry Pili (son) with above info. Continue to encourage PO intake/hydration Continue with conservative management.  Appreciate Palliative care's assistance. Continue GOC discussions Rhabdomyolysis - unknown time down at home.  CK levels improving with IVF's. Stopped IVF as above AMS - likely combination of meds and AKI. gabapentin dec'd to 100 mg bid and  follow. Per primary Parkinson's - apparently poor functional status and is incontinent at home.  Palliative care following. HTN - off ace and diuretics for now. BP low.  Can try for IV albumin if worsening Hyperkalemia - improved with lokelma.  Continue to follow. AGMA - on bicarb-stable/improved Bladder mass- unclear if this is malignancy or stone. Will need outpatient folley BOO- marked UOP after placement of Foley (900 cc's after placement).  Likely due to neurogenic bladder.  Continue with indwelling foley catheter and will need outpatient urologic evaluation. Dispositon - poor overall prognosis given co-morbidities (Parkinson's, dementia), poor functional and nutritional status with FTT.  Appreciate Palliative care's input.  Pt is now DNR.   Gean Quint, MD [facility]

## 2021-07-07 NOTE — Care Management Important Message (Signed)
Important Message  Patient Details  Name: Lucas [name] MRN: [id number] Date of Birth: 03-12-1946   Medicare Important Message Given:  Yes     Tommy Medal 07/07/2021, 12:53 PM

## 2021-07-07 NOTE — Progress Notes (Signed)
PROGRESS NOTE    [name]  PXT:[id number] DOB: 01/16/1946 DOA: 06/27/2021 PCP: Alfonse Flavors, MD    Subjective:  The patient was seen and examined this morning, lethargic, but arousable with stimuli, follows some command.   POA, son present at bedside discussed plan of care, poor prognosis options regarding hospice home versus home with hospice --He will discuss with his other brother and family members today     Brief Narrative:   Lucas [name] is a 75 y.o. male with medical history significant of Parkinson's disease, prior CVA, COPD, tobacco use, hypertension, chronic lower extremity edema, hyperlipidemia, normally lives at home.  Family had brought him to ED due to what appears to be a chronic decline in his overall condition with confusion.  Patient was admitted for acute metabolic encephalopathy and associated acute kidney injury that appears to be due to rhabdomyolysis.  Patient continues to require aggressive IV fluids per nephrology.  Continue to monitor creatinine trend and follow for outcomes.  Nephrology and palliative care following.      Assessment & Plan:   Active Problems:   Parkinson's disease ([place])   Generalized weakness   Essential hypertension   Hyperlipidemia   COPD (chronic obstructive pulmonary disease) (HCC)   AKI (acute kidney injury) ([place])   Acute metabolic encephalopathy   Bladder mass   Pressure injury of skin   Acute kidney injury secondary to rhabdomyolysis -Conservative management, some improvement noted    Intake/Output Summary (Last 24 hours) at 07/07/2021 1005 Last data filed at 07/07/2021 0606    Gross per 24 hour  Intake 180 ml  Output 2550 ml  Net -2370 ml      -Progressively declining physically and mentally   -Continue to have decreased p.o. intake in the setting of ongoing diuretic use and lisinopril. -Imaging of the abdomen did not reveal any hydronephrosis  -Appreciate nephrology following;  determined that patient is NOT a hemodialysis candidate by multiple nephrology input due to multiple call Reddys, patient is steadily declining in face of comorbidities Parkinson's dementia poor function nutritional status-very poor prognosis  Patient is physically and mentally declining - heading towards palliative care, hospice -Family has not come to terms with comfort care measures yet Anticipating in hospital death at this time    -Status post IV fluid resuscitation, subsequently discontinued -Nephrology following, monitoring I's and O's, Foley catheter was placed planning to discontinue   Transaminitis 2/2 rhabdomyolysis -improving -Trending down CK  Bladder outlet obstruction -Status post Foley catheter -monitoring strict I's and O's in setting of renal failure -Continue for now and will require outpatient urology evaluation for void trial   Acute metabolic encephalopathy -Excessively lethargic today -Likely related to progressive renal failure, medication including morphine in setting of AKI,  -ABG, TSH, and ammonia within normal limits -Per multiple nephrologist patient is not a candidate for hemodialysis -Holding sedating meds   Hypertension -Holding antihypertensives now since he was likely hypovolemic -Blood pressure remained soft   Parkinson's disease-along with dementia -On Sinemet --we may hold if unable to swallow   Hyperlipidemia -On statin   Bladder mass -Appears to have been present on CT imaging from 2019 and appears unchanged -This can be followed outpatient by urology   COPD -No wheezing or shortness of breath at this time Stable on 2 L of oxygen, satting> 92% -Continue as needed bronchodilators   Sever Generalized weakness -last Physical therapy evaluation ordered 11/8 -Patient is currently bedbound, can only be moved with [facility] lift -Patient  has a severe global weakness, per nursing staff able to move with a [place] SNF-comfort  care, hospice, hospice home home   Goals of care -Palliative care and transition of care, on board, extensive family discussion regarding patient's goal of care, per nephrology and our evaluation patient is declining physically and mentally-poor prognosis, recommending comfort caremeasures with hospice -Family has agreed to DNR/DNI status -Family currently not on board with hospice -palliative care following diligently  -Reaching out to patient's son for further discussion  07/07/2021  -POA Mr. [name] was present at bedside, discussed double finding, plan of care options for disposition: Hospice home versus home with hospice services as before  He is to discuss the options with his family members including his brother-leaning towards hospice home if is close enough to where he lives in a family members.     DVT prophylaxis: Heparin Code Status: DNR Family Communication: Continue discussing with family to determine goal of care POA Mr. [name] -updated 07/07/2021  Disposition Plan:  Status is: Inpatient   Remains inpatient appropriate because: To determine goal of care, final disposition, home with hospice services, versus hospice home     Skin Assessment:   I have examined the patient's skin and I agree with the wound assessment as performed by the wound care RN as outlined below:   Pressure Injury 03/04/21 Sacrum Mid Stage 2 -  Partial thickness loss of dermis presenting as a shallow open injury with a red, pink wound bed without slough. (Active)  03/04/21 2245  Location: Sacrum  Location Orientation: Mid  Staging: Stage 2 -  Partial thickness loss of dermis presenting as a shallow open injury with a red, pink wound bed without slough.  Wound Description (Comments):   Present on Admission: Yes      Consultants:  Nephrology Palliative care   Procedures:  See below   Antimicrobials:  None  Objective: Vitals:   07/06/21 0938 07/06/21 1344 07/06/21 2048  07/07/21 0423  BP: 104/74 100/64 (!) 160/53 (!) 140/49  Pulse: 84 73 85 76  Resp: 17 20 (!) 21 20  Temp: 98.9 F (37.2 C) 98.7 F (37.1 C) 98 F (36.7 C) 99 F (37.2 C)  TempSrc: Oral Oral Oral   SpO2: 94% 94% 93% 92%  Weight:      Height:        Intake/Output Summary (Last 24 hours) at 07/07/2021 1221 Last data filed at 07/07/2021 1000 Gross per 24 hour  Intake 660 ml  Output 3350 ml  Net -2690 ml   Filed Weights   06/27/21 1109 06/27/21 1546  Weight: 90.7 kg 81.2 kg        Physical Exam:   General:  Lethargic but arousable  HEENT:  Normocephalic, PERRL, otherwise with in Normal limits   Neuro:  Poor cognition - CNII-XII intact. , normal motor and sensation, reflexes intact   Lungs:   Clear to auscultation BL, Respirations unlabored, no wheezes / crackles  Cardio:    S1/S2, RRR, No murmure, No Rubs or Gallops   Abdomen:   Soft, non-tender, bowel sounds active all four quadrants,  no guarding or peritoneal signs.  Muscular skeletal:  Limited exam -severe global generalized weaknesses, can be mobilized only with assist ([place] lift) 2+ pulses,  symmetric, No pitting edema  Skin:  Dry, warm to touch, negative for any Rashes,  Wounds: Please see nursing documentation  Pressure Injury 03/04/21 Sacrum Mid Stage 2 -  Partial thickness loss of dermis presenting  as a shallow open injury with a red, pink wound bed without slough. (Active)  03/04/21 2245  Location: Sacrum  Location Orientation: Mid  Staging: Stage 2 -  Partial thickness loss of dermis presenting as a shallow open injury with a red, pink wound bed without slough.  Wound Description (Comments):   Present on Admission: Yes     Pressure Injury 06/28/21 Heel Left Deep Tissue Pressure Injury - Purple or maroon localized area of discolored intact skin or blood-filled blister due to damage of underlying soft tissue from pressure and/or shear. (Active)  06/28/21 0800  Location: Heel  Location Orientation: Left   Staging: Deep Tissue Pressure Injury - Purple or maroon localized area of discolored intact skin or blood-filled blister due to damage of underlying soft tissue from pressure and/or shear.  Wound Description (Comments):   Present on Admission: Yes     Pressure Injury 06/28/21 Buttocks Left Deep Tissue Pressure Injury - Purple or maroon localized area of discolored intact skin or blood-filled blister due to damage of underlying soft tissue from pressure and/or shear. (Active)  06/28/21 0800  Location: Buttocks  Location Orientation: Left  Staging: Deep Tissue Pressure Injury - Purple or maroon localized area of discolored intact skin or blood-filled blister due to damage of underlying soft tissue from pressure and/or shear.  Wound Description (Comments):   Present on Admission: Yes     Pressure Injury 07/02/21 Rectum Mid Stage 2 -  Partial thickness loss of dermis presenting as a shallow open injury with a red, pink wound bed without slough. skin red, open, small amount of drainage (Active)  07/02/21 0845  Location: Rectum  Location Orientation: Mid  Staging: Stage 2 -  Partial thickness loss of dermis presenting as a shallow open injury with a red, pink wound bed without slough.  Wound Description (Comments): skin red, open, small amount of drainage  Present on Admission:          Foley catheter required for strict I's and O's, as patient going to renal failure        Data Reviewed: I have personally reviewed following labs and imaging studies  CBC: Recent Labs  Lab 07/01/21 0431 07/02/21 0413 07/05/21 0445 07/06/21 0549 07/07/21 0431  WBC 12.6* 11.2* 9.3 9.5 10.4  HGB 11.5* 11.7* 9.9* 8.9* 9.2*  HCT 35.7* 36.5* 29.1* 27.3* 28.3*  MCV 98.9 99.5 96.7 98.2 99.3  PLT 170 167 198 218 081   Basic Metabolic Panel: Recent Labs  Lab 07/03/21 0339 07/04/21 0516 07/05/21 0445 07/06/21 0549 07/07/21 0431  NA 137 137 139 142  142 146*  K 4.8 4.8 4.4 4.0  4.0 3.7  CL  102 101 100 105  105 109  CO2 22 24 23 22  22 24   GLUCOSE 86 99 88 90  90 93  BUN 108* 124* 130* 120*  118* 116*  CREATININE 8.40* 7.97* 8.02* 7.26*  7.10* 6.13*  CALCIUM 6.6* 7.0* 7.1* 7.4*  7.5* 7.9*  MG 2.1 2.1 2.1 2.1 2.1  PHOS  --   --  7.8* 7.2*  --    GFR: Estimated Creatinine Clearance: 10.4 mL/min (A) (by C-G formula based on SCr of 6.13 mg/dL (H)). Liver Function Tests: Recent Labs  Lab 07/01/21 0431 07/02/21 0413 07/03/21 0339 07/04/21 0516 07/05/21 0445 07/06/21 0549  AST 386* 294* 142* 96*  --   --   ALT 16 20 12 13   --   --   ALKPHOS 55 66 80 87  --   --  BILITOT 0.8 1.0 0.8 0.8  --   --   PROT 5.3* 5.3* 4.6* 5.1*  --   --   ALBUMIN 2.2* 2.2* 1.8* 2.0* 2.0* 2.2*   No results for input(s): LIPASE, AMYLASE in the last 168 hours. No results for input(s): AMMONIA in the last 168 hours.  Coagulation Profile: No results for input(s): INR, PROTIME in the last 168 hours. Cardiac Enzymes: Recent Labs  Lab 07/03/21 0339 07/04/21 0516 07/05/21 0445 07/06/21 0549 07/07/21 0431  CKTOTAL 13,734* 10,131* 6,250* 4,714* 3,759*       Radiology Studies: No results found.      Scheduled Meds:  ALPRAZolam  1 mg Oral QHS   aspirin EC  81 mg Oral Daily   carbidopa-levodopa  1 tablet Oral TID   Chlorhexidine Gluconate Cloth  6 each Topical Daily   gabapentin  100 mg Oral BID   heparin  5,000 Units Subcutaneous Q8H   nystatin   Topical BID   sodium bicarbonate  1,300 mg Oral TID   Continuous Infusions:     LOS: 9 days    Time spent: 35 minutes    Deatra James, MD [facility]  If 7PM-7AM, please contact night-coverage [email or web address] 07/07/2021, 12:21 PM

## 2021-07-07 NOTE — Consult Note (Signed)
[place] Nurse wound consult note Consultation was completed by review of records, images and assistance from the bedside nurse/clinical staff.   Reason for Consult: pressure injury care Wound type: Deep Tissue Pressure Injury; left heel Deep Tissue Pressure Injury: left buttock  Stage 2 Pressure Injury: near rectum; unclear etiology unless patient has had medical device to apply pressure in this area Pressure Injury POA: Yes Measurement: Left heel: 5cm x 3cm x 0 Left buttock: 5.5cm x 5cm x 0cm  Rectum: unclear; no measurements noted on flowsheet  Wound XHF:SFSELTR and heel: dark purple, non blanchable Area near rectum: pink Drainage (amount, consistency, odor) no drainage from wounds Periwound:intact  Dressing procedure/placement/frequency:  Continue silicone foam dressings for protection Offload heels with Prevalon boots Air mattress for moisture management and pressure redistribution.   Discussed POC with bedside nurse.  Re consult if needed, will not follow at this time. Thanks  Simya Tercero [employer], RN,CWOCN, CNS, [place] [phone])

## 2021-07-07 NOTE — Progress Notes (Signed)
CNA advised me to assess Sacrum wound. Assessed and foam placed. The wound is stage 2 comfirmed with charge nurse along with advised charge that patients breath sounds wheezing on inspiration and expiration along with trouble coughing that is nonproductive. Called Respiratory to administer nebulizer.

## 2021-07-07 NOTE — Progress Notes (Signed)
Was called earlier to patient's room to check on patient.  Was told by RN that patient had been coughing more and did not sound good.  Assessed patient, BS were Coarse and reviewed labs that revealed elevated BNP.  RN notified, MD notified by RN, X-ray called for stat CXR.  Had to raise patient's Flow rate to 5L due to increasing lower sat readings.  Waiting for CXR results.

## 2021-07-07 NOTE — TOC Progression Note (Signed)
Transition of Care [facility]) - Progression Note    Patient Details  Name: Lucas [name] MRN: [id number] Date of Birth: Oct 30, 1945  Transition of Care [facility]) CM/SW Contact  Shade Flood, LCSW Phone Number: 07/07/2021, 3:01 PM  Clinical Narrative:     TOC following. Per MD, he spoke with pt's son, Audry Pili, this AM to explain pt's condition and recommendation for hospice. This LCSW spoke with Audry Pili this afternoon to follow up. Per Audry Pili, he and his family would like to continue with care at the hospital and do not feel ready for a hospice referral at this time. He states that he would like to see if pt's insurance approves rehab. Audry Pili states that he would like to see how pt does over the weekend and then discuss on Monday which direction they want to go. He is hoping we will have an answer from the insurance by Monday.   TOC will follow.  Expected Discharge Plan: [place] Barriers to Discharge: Continued Medical Work up  Expected Discharge Plan and Services Expected Discharge Plan: [place] In-house Referral: Clinical Social Work   Post Acute Care Choice: [place] Living arrangements for the past 2 months: Single Family Home                                       Social Determinants of Health (SDOH) Interventions    Readmission Risk Interventions Readmission Risk Prevention Plan 07/04/2021 06/29/2021 03/28/2020  Transportation Screening Complete Complete Complete  Home Care Screening Complete Complete Complete  Medication Review (RN CM) Complete - Complete  Some recent data might be hidden

## 2021-07-08 DIAGNOSIS — N3289 Other specified disorders of bladder: Secondary | ICD-10-CM | POA: Diagnosis not present

## 2021-07-08 DIAGNOSIS — N179 Acute kidney failure, unspecified: Secondary | ICD-10-CM | POA: Diagnosis present

## 2021-07-08 DIAGNOSIS — G9341 Metabolic encephalopathy: Secondary | ICD-10-CM | POA: Diagnosis not present

## 2021-07-08 DIAGNOSIS — N189 Chronic kidney disease, unspecified: Secondary | ICD-10-CM | POA: Diagnosis present

## 2021-07-08 DIAGNOSIS — J9602 Acute respiratory failure with hypercapnia: Secondary | ICD-10-CM | POA: Diagnosis not present

## 2021-07-08 LAB — LACTIC ACID, PLASMA: Lactic Acid, Venous: 0.9 mmol/L (ref 0.5–1.9)

## 2021-07-08 LAB — BASIC METABOLIC PANEL
Anion gap: 12 (ref 5–15)
BUN: 88 mg/dL — ABNORMAL HIGH (ref 8–23)
CO2: 23 mmol/L (ref 22–32)
Calcium: 8 mg/dL — ABNORMAL LOW (ref 8.9–10.3)
Chloride: 111 mmol/L (ref 98–111)
Creatinine, Ser: 4.5 mg/dL — ABNORMAL HIGH (ref 0.61–1.24)
GFR, Estimated: 13 mL/min — ABNORMAL LOW (ref >60)
Glucose, Bld: 111 mg/dL — ABNORMAL HIGH (ref 70–99)
Potassium: 3.2 mmol/L — ABNORMAL LOW (ref 3.5–5.1)
Sodium: 146 mmol/L — ABNORMAL HIGH (ref 135–145)

## 2021-07-08 LAB — CBC
HCT: 29.2 % — ABNORMAL LOW (ref 39.0–52.0)
Hemoglobin: 9.7 g/dL — ABNORMAL LOW (ref 13.0–17.0)
MCH: 32.9 pg (ref 26.0–34.0)
MCHC: 33.2 g/dL (ref 30.0–36.0)
MCV: 99 fL (ref 80.0–100.0)
Platelets: 264 10*3/uL (ref 150–400)
RBC: 2.95 MIL/uL — ABNORMAL LOW (ref 4.22–5.81)
RDW: 13 % (ref 11.5–15.5)
WBC: 10.6 10*3/uL — ABNORMAL HIGH (ref 4.0–10.5)
nRBC: 0 % (ref 0.0–0.2)

## 2021-07-08 LAB — PREALBUMIN: Prealbumin: 10.1 mg/dL — ABNORMAL LOW (ref 18–38)

## 2021-07-08 LAB — MAGNESIUM: Magnesium: 2 mg/dL (ref 1.7–2.4)

## 2021-07-08 LAB — PROCALCITONIN: Procalcitonin: 0.62 ng/mL

## 2021-07-08 LAB — CK: Total CK: 2647 U/L — ABNORMAL HIGH (ref 49–397)

## 2021-07-08 MED ORDER — SODIUM BICARBONATE 650 MG PO TABS
650.0000 mg | ORAL_TABLET | Freq: Two times a day (BID) | ORAL | Status: DC
  Administered 2021-07-09 – 2021-07-19 (×20): 650 mg via ORAL
  Filled 2021-07-08 (×21): qty 1

## 2021-07-08 MED ORDER — FUROSEMIDE 10 MG/ML IJ SOLN
40.0000 mg | Freq: Once | INTRAMUSCULAR | Status: AC
  Administered 2021-07-08: 40 mg via INTRAVENOUS
  Filled 2021-07-08: qty 4

## 2021-07-08 MED ORDER — ALBUTEROL SULFATE (2.5 MG/3ML) 0.083% IN NEBU
2.5000 mg | INHALATION_SOLUTION | RESPIRATORY_TRACT | Status: DC
  Administered 2021-07-08 – 2021-07-10 (×14): 2.5 mg via RESPIRATORY_TRACT
  Filled 2021-07-08 (×14): qty 3

## 2021-07-08 MED ORDER — POTASSIUM CHLORIDE CRYS ER 20 MEQ PO TBCR
40.0000 meq | EXTENDED_RELEASE_TABLET | Freq: Once | ORAL | Status: AC
  Administered 2021-07-08: 40 meq via ORAL
  Filled 2021-07-08: qty 2

## 2021-07-08 NOTE — Progress Notes (Signed)
Patient refusing to keep nasal cannula on. He keeps pulling [place] off stating "it is irritating him."

## 2021-07-08 NOTE — TOC Progression Note (Signed)
Transition of Care [facility]) - Progression Note    Patient Details  Name: Lucas [name] MRN: [id number] Date of Birth: 10/06/1945  Transition of Care [facility]) CM/SW [place], LCSW Phone Number: 07/08/2021, 1:25 PM  Clinical Narrative:    CSW followed up with patient's son to inquire about families decision for discharge planning. Patient's son reported that he is still discussing options with his sister and has not made a decision. TOC to follow.   Expected Discharge Plan: [place] Barriers to Discharge: Continued Medical Work up  Expected Discharge Plan and Services Expected Discharge Plan: [place] In-house Referral: Clinical Social Work   Post Acute Care Choice: [place] Living arrangements for the past 2 months: Single Family Home                                       Social Determinants of Health (SDOH) Interventions    Readmission Risk Interventions Readmission Risk Prevention Plan 07/04/2021 06/29/2021 03/28/2020  Transportation Screening Complete Complete Complete  Home Care Screening Complete Complete Complete  Medication Review (RN CM) Complete - Complete  Some recent data might be hidden

## 2021-07-08 NOTE — Progress Notes (Signed)
PROGRESS NOTE    Lucas [name]  SWF:[id number] DOB: [birth year] DOA: 06/27/2021 PCP: Alfonse Flavors, MD    Subjective: The patient was seen and examined this morning, on 5 L of oxygen satting 95%, blood pressure 90/67, respiratory rate 22 lethargic but arousable follows some command  Son present at bedside-once again reminded of patient condition declining  Brief Narrative:   Lucas [name] is a 75 y.o. male with medical history significant of Parkinson's disease, prior CVA, COPD, tobacco use, hypertension, chronic lower extremity edema, hyperlipidemia, normally lives at home.  Family had brought him to ED due to what appears to be a chronic decline in his overall condition with confusion.  Patient was admitted for acute metabolic encephalopathy and associated acute kidney injury that appears to be due to rhabdomyolysis.  Patient continues to require aggressive IV fluids per nephrology.  Continue to monitor creatinine trend and follow for outcomes.  Nephrology and palliative care following.     Assessment & Plan:   Principal Problem:   Acute on chronic renal failure (HCC) Active Problems:   Parkinson's disease (HCC)   Generalized weakness   Essential hypertension   Hyperlipidemia   COPD (chronic obstructive pulmonary disease) (HCC)   Acute metabolic encephalopathy   Bladder mass   Pressure injury of skin   Acute respiratory failure with hypercapnia (HCC)    Acute respiratory failure  -Multifactorial, patient is bedbound, h/o COPD, likely due to fluid retaining, -Patient currently requiring 5 L of oxygen, maintaining saturation of 95% -We will initiate 1 dose of Lasix 40 mg IV -DuoNeb bronchodilators as needed--RT consulted -We will not obtain ABGs as we do not anticipate intubation or change patient status-goals of care at this point DNR-DNI  Acute kidney injury/failure multifactorial exacerbated by rhabdomyolysis -Continue conservative management -Some  improvement in BUN/creatinine noted (With fluid resuscitation patient goes into respiratory failure due to organ dysfunction including longs and Heart..H/o COPD,      -Progressively declining physically and mentally   -Continue to have decreased p.o. intake in the setting of ongoing diuretic use and lisinopril. -Imaging of the abdomen did not reveal any hydronephrosis  -Multiple nephrologist has been following closely--determined that patient is not a candidate for hemodialysis.  Due to his multiple comorbidities..  Low function, including bedbound.. Prognosis deemed poor  -Family has not chosen hospice, comfort care measures at this point At this rate of progressively physical and mental decline anticipating in-hospital death  Note: Records indicate the patient was under hospice care at home prior to admission due to multiple comorbidities - Bedbound, poor function at baseline      -Status post IV fluid resuscitation, subsequently discontinued -Nephrology following, monitoring I's and O's, Foley catheter was placed planning to discontinue   Transaminitis 2/2 rhabdomyolysis --noted improvement -Trending down CK  Bladder outlet obstruction -Status post Foley catheter -monitoring strict I's and O's in setting of renal failure -Continue for now and will require outpatient urology evaluation for void trial   Acute metabolic encephalopathy -Worsening mental status -likely related to hypoxia, acute respiratory failure, ongoing renal failure-superimposed by meds         -TSH, and ammonia within normal limits  -Holding sedating meds   Hypertension -Holding BP meds, blood pressure remained soft -Blood pressure remained soft   Parkinson's disease-along with dementia -On Sinemet --progressively worsening mental status  Hyperlipidemia -On statin, anticipating to hold   Bladder mass -Appears to have been present on CT imaging from 2019 and appears unchanged -This can  be followed  outpatient by urology   COPD -No wheezing, mild crackles or rhonchi's noted, O2 demand has increased to 5 L of oxygen by nasal cannula--currently satting 95% -Continue as needed bronchodilators   Sever Generalized weakness -bedbound -last Physical therapy evaluation ordered 11/8 -Patient is currently bedbound, can only be moved with Harrel Lemon lift -Patient has a severe global weakness, per nursing staff able to move with a [facility]-comfort care, hospice, hospice home home   Developed pressure ulcers on the heels..  Please see nurses note for details Continue wound care, canary bed provided  Goals of care -Palliative care following, extensive family meeting with both sons Last family meeting 07/08/2021 -Family not on board with comfort care, hospice yet  Willing to entertain hospice home idea for now -Palliative care and transition of care --following  -Family has agreed to DNR/DNI status     11/10 11/11 - 11/12  -POA Mr. Milbern Doescher  (his second son Ronalee Belts and his wife ) was present at bedside, discussed the above finding, plan of care options for disposition: Hospice home versus home with hospice services as before  He is to discuss the options with his other family members -not on board with comfort care measures or hospice yet   DVT prophylaxis: Heparin Code Status: DNR Family Communication: Continue discussing with family to determine goal of care POA Mr. Tedrick Port -updated 07/08/2021  Disposition Plan:  Status is: Inpatient   Remains inpatient appropriate because: To determine goal of care, final disposition, home with hospice services, versus hospice home     Skin Assessment:   I have examined the patient's skin and I agree with the wound assessment as performed by the wound care RN as outlined below:   Pressure Injury 03/04/21 Sacrum Mid Stage 2 -  Partial thickness loss of dermis presenting as a shallow open injury with a red, pink wound bed  without slough. (Active)  03/04/21 2245  Location: Sacrum  Location Orientation: Mid  Staging: Stage 2 -  Partial thickness loss of dermis presenting as a shallow open injury with a red, pink wound bed without slough.  Wound Description (Comments):   Present on Admission: Yes      Consultants:  Nephrology Palliative care   Procedures:  See below   Antimicrobials:  None  Objective: Vitals:   07/08/21 0140 07/08/21 0430 07/08/21 0444 07/08/21 0835  BP:   90/67   Pulse:   75   Resp:      Temp:   97.7 F (36.5 C)   TempSrc:   Oral   SpO2: 97% 93% 98% 95%  Weight:      Height:        Intake/Output Summary (Last 24 hours) at 07/08/2021 1202 Last data filed at 07/08/2021 1020 Gross per 24 hour  Intake 480 ml  Output 2900 ml  Net -2420 ml   Filed Weights   06/27/21 1109 06/27/21 1546  Weight: 90.7 kg 81.2 kg       Physical Exam:   General:  Lethargic but arousable  HEENT:  Normocephalic, PERRL, otherwise with in Normal limits   Neuro:  Limited exam in bed-bedbound CNII-XII intact. ,  Lungs:   Positive breath sounds in mid to upper lobes, diffuse rhonchi, negative any wheezing, mild crackles bilateral lower lobes  Cardio:    S1/S2, RRR, No murmure, No Rubs or Gallops   Abdomen:   Obese, soft, non-tender, bowel sounds active all four quadrants,  no guarding or  peritoneal signs.  Muscular skeletal:  Limited exam - in bed, extensively diminished lower extremity strength 2+ pulses,  symmetric, ++2 pitting edema  Skin:  Dry, warm to touch, negative for any Rashes,  Wounds: Please see nursing documentation  Pressure Injury 03/04/21 Sacrum Mid Stage 2 -  Partial thickness loss of dermis presenting as a shallow open injury with a red, pink wound bed without slough. (Active)  03/04/21 2245  Location: Sacrum  Location Orientation: Mid  Staging: Stage 2 -  Partial thickness loss of dermis presenting as a shallow open injury with a red, pink wound bed without slough.   Wound Description (Comments):   Present on Admission: Yes     Pressure Injury 06/28/21 Heel Left Deep Tissue Pressure Injury - Purple or maroon localized area of discolored intact skin or blood-filled blister due to damage of underlying soft tissue from pressure and/or shear. (Active)  06/28/21 0800  Location: Heel  Location Orientation: Left  Staging: Deep Tissue Pressure Injury - Purple or maroon localized area of discolored intact skin or blood-filled blister due to damage of underlying soft tissue from pressure and/or shear.  Wound Description (Comments):   Present on Admission: Yes     Pressure Injury 06/28/21 Buttocks Left Deep Tissue Pressure Injury - Purple or maroon localized area of discolored intact skin or blood-filled blister due to damage of underlying soft tissue from pressure and/or shear. (Active)  06/28/21 0800  Location: Buttocks  Location Orientation: Left  Staging: Deep Tissue Pressure Injury - Purple or maroon localized area of discolored intact skin or blood-filled blister due to damage of underlying soft tissue from pressure and/or shear.  Wound Description (Comments):   Present on Admission: Yes     Pressure Injury 07/02/21 Rectum Mid Stage 2 -  Partial thickness loss of dermis presenting as a shallow open injury with a red, pink wound bed without slough. skin red, open, small amount of drainage (Active)  07/02/21 0845  Location: Rectum  Location Orientation: Mid  Staging: Stage 2 -  Partial thickness loss of dermis presenting as a shallow open injury with a red, pink wound bed without slough.  Wound Description (Comments): skin red, open, small amount of drainage  Present on Admission:              Data Reviewed: I have personally reviewed following labs and imaging studies  CBC: Recent Labs  Lab 07/02/21 0413 07/05/21 0445 07/06/21 0549 07/07/21 0431 07/08/21 0357  WBC 11.2* 9.3 9.5 10.4 10.6*  HGB 11.7* 9.9* 8.9* 9.2* 9.7*  HCT 36.5* 29.1*  27.3* 28.3* 29.2*  MCV 99.5 96.7 98.2 99.3 99.0  PLT 167 198 218 243 622   Basic Metabolic Panel: Recent Labs  Lab 07/04/21 0516 07/05/21 0445 07/06/21 0549 07/07/21 0431 07/08/21 0357  NA 137 139 142  142 146* 146*  K 4.8 4.4 4.0  4.0 3.7 3.2*  CL 101 100 105  105 109 111  CO2 24 23 22  22 24 23   GLUCOSE 99 88 90  90 93 111*  BUN 124* 130* 120*  118* 116* 88*  CREATININE 7.97* 8.02* 7.26*  7.10* 6.13* 4.50*  CALCIUM 7.0* 7.1* 7.4*  7.5* 7.9* 8.0*  MG 2.1 2.1 2.1 2.1 2.0  PHOS  --  7.8* 7.2*  --   --    GFR: Estimated Creatinine Clearance: 14.1 mL/min (A) (by C-G formula based on SCr of 4.5 mg/dL (H)). Liver Function Tests: Recent Labs  Lab 07/02/21 0413 07/03/21 0339 07/04/21  1443 07/05/21 0445 07/06/21 0549  AST 294* 142* 96*  --   --   ALT 20 12 13   --   --   ALKPHOS 66 80 87  --   --   BILITOT 1.0 0.8 0.8  --   --   PROT 5.3* 4.6* 5.1*  --   --   ALBUMIN 2.2* 1.8* 2.0* 2.0* 2.2*   No results for input(s): LIPASE, AMYLASE in the last 168 hours. No results for input(s): AMMONIA in the last 168 hours.  Coagulation Profile: No results for input(s): INR, PROTIME in the last 168 hours. Cardiac Enzymes: Recent Labs  Lab 07/04/21 0516 07/05/21 0445 07/06/21 0549 07/07/21 0431 07/08/21 0357  CKTOTAL 10,131* 6,250* 4,714* 3,759* 2,647*       Radiology Studies: DG CHEST PORT 1 VIEW  Result Date: 07/07/2021 CLINICAL DATA:  Fluid overload EXAM: PORTABLE CHEST 1 VIEW COMPARISON:  07/03/2021, 06/27/2021, 12/21/2019 FINDINGS: Low lung volumes. Borderline cardiomegaly. Mild bronchovascular crowding due to low lung volume. No pleural effusion or pneumothorax. IMPRESSION: Hypoventilatory changes with borderline cardiomegaly Electronically Signed   By: Donavan Foil M.D.   On: 07/07/2021 23:09        Scheduled Meds:  albuterol  2.5 mg Nebulization Q4H   ALPRAZolam  1 mg Oral QHS   aspirin EC  81 mg Oral Daily   carbidopa-levodopa  1 tablet Oral TID    Chlorhexidine Gluconate Cloth  6 each Topical Daily   gabapentin  100 mg Oral BID   heparin  5,000 Units Subcutaneous Q8H   nystatin   Topical BID   sodium bicarbonate  1,300 mg Oral TID   Continuous Infusions:     LOS: 10 days    Time spent: 55 minutes    Deatra James, MD [facility]  If 7PM-7AM, please contact night-coverage [email or web address] 07/08/2021, 12:02 PM

## 2021-07-09 ENCOUNTER — Inpatient Hospital Stay (HOSPITAL_COMMUNITY): Payer: Medicare Other

## 2021-07-09 DIAGNOSIS — N3289 Other specified disorders of bladder: Secondary | ICD-10-CM | POA: Diagnosis not present

## 2021-07-09 DIAGNOSIS — G9341 Metabolic encephalopathy: Secondary | ICD-10-CM | POA: Diagnosis not present

## 2021-07-09 DIAGNOSIS — J9602 Acute respiratory failure with hypercapnia: Secondary | ICD-10-CM | POA: Diagnosis not present

## 2021-07-09 DIAGNOSIS — N179 Acute kidney failure, unspecified: Secondary | ICD-10-CM | POA: Diagnosis not present

## 2021-07-09 LAB — BASIC METABOLIC PANEL
Anion gap: 12 (ref 5–15)
BUN: 62 mg/dL — ABNORMAL HIGH (ref 8–23)
CO2: 24 mmol/L (ref 22–32)
Calcium: 7.9 mg/dL — ABNORMAL LOW (ref 8.9–10.3)
Chloride: 106 mmol/L (ref 98–111)
Creatinine, Ser: 3 mg/dL — ABNORMAL HIGH (ref 0.61–1.24)
GFR, Estimated: 21 mL/min — ABNORMAL LOW (ref >60)
Glucose, Bld: 120 mg/dL — ABNORMAL HIGH (ref 70–99)
Potassium: 3.3 mmol/L — ABNORMAL LOW (ref 3.5–5.1)
Sodium: 142 mmol/L (ref 135–145)

## 2021-07-09 LAB — URINALYSIS, ROUTINE W REFLEX MICROSCOPIC
Bilirubin Urine: NEGATIVE
Glucose, UA: NEGATIVE mg/dL
Ketones, ur: NEGATIVE mg/dL
Leukocytes,Ua: NEGATIVE
Nitrite: NEGATIVE
Protein, ur: NEGATIVE mg/dL
Specific Gravity, Urine: 1.008 (ref 1.005–1.030)
pH: 8 (ref 5.0–8.0)

## 2021-07-09 LAB — CBC
HCT: 30.4 % — ABNORMAL LOW (ref 39.0–52.0)
Hemoglobin: 10.2 g/dL — ABNORMAL LOW (ref 13.0–17.0)
MCH: 32.9 pg (ref 26.0–34.0)
MCHC: 33.6 g/dL (ref 30.0–36.0)
MCV: 98.1 fL (ref 80.0–100.0)
Platelets: 316 10*3/uL (ref 150–400)
RBC: 3.1 MIL/uL — ABNORMAL LOW (ref 4.22–5.81)
RDW: 13 % (ref 11.5–15.5)
WBC: 14.8 10*3/uL — ABNORMAL HIGH (ref 4.0–10.5)
nRBC: 0 % (ref 0.0–0.2)

## 2021-07-09 LAB — MAGNESIUM: Magnesium: 1.7 mg/dL (ref 1.7–2.4)

## 2021-07-09 LAB — CK: Total CK: 1848 U/L — ABNORMAL HIGH (ref 49–397)

## 2021-07-09 MED ORDER — SODIUM CHLORIDE 0.9 % IV BOLUS
500.0000 mL | Freq: Once | INTRAVENOUS | Status: AC
  Administered 2021-07-10: 500 mL via INTRAVENOUS

## 2021-07-09 MED ORDER — PIPERACILLIN-TAZOBACTAM 3.375 G IVPB
3.3750 g | Freq: Three times a day (TID) | INTRAVENOUS | Status: DC
  Administered 2021-07-09 – 2021-07-11 (×6): 3.375 g via INTRAVENOUS
  Filled 2021-07-09 (×6): qty 50

## 2021-07-09 MED ORDER — SODIUM CHLORIDE 0.9 % IV BOLUS
500.0000 mL | Freq: Once | INTRAVENOUS | Status: AC
  Administered 2021-07-09: 500 mL via INTRAVENOUS

## 2021-07-09 MED ORDER — POTASSIUM CHLORIDE CRYS ER 20 MEQ PO TBCR
40.0000 meq | EXTENDED_RELEASE_TABLET | Freq: Once | ORAL | Status: AC
  Administered 2021-07-09: 40 meq via ORAL
  Filled 2021-07-09: qty 2

## 2021-07-09 MED ORDER — FUROSEMIDE 10 MG/ML IJ SOLN
40.0000 mg | Freq: Once | INTRAMUSCULAR | Status: AC
  Administered 2021-07-09: 40 mg via INTRAVENOUS
  Filled 2021-07-09: qty 4

## 2021-07-09 NOTE — Progress Notes (Signed)
PROGRESS NOTE    Lucas [name]  FIE:[id number] DOB: [birth year] DOA: 06/27/2021 PCP: Alfonse Flavors, MD    Subjective: The patient was seen and examined this morning, stable, per nursing staff  Tmax 101.5, 100.2 this AM  Tylenol p.o. was given Patient remains lethargic, but arousable requiring 4 L of oxygen maintaining O2 sat of 93%  Son present at bedside-once again reminded of patient condition declining  Brief Narrative:   Lucas [name] is a 75 y.o. male with medical history significant of Parkinson's disease, prior CVA, COPD, tobacco use, hypertension, chronic lower extremity edema, hyperlipidemia, normally lives at home.  Family had brought him to ED due to what appears to be a chronic decline in his overall condition with confusion.  Patient was admitted for acute metabolic encephalopathy and associated acute kidney injury that appears to be due to rhabdomyolysis.  Patient continues to require aggressive IV fluids per nephrology.  Continue to monitor creatinine trend and follow for outcomes.  Nephrology and palliative care following.     Assessment & Plan:   Principal Problem:   Acute on chronic renal failure (HCC) Active Problems:   Parkinson's disease (HCC)   Generalized weakness   Essential hypertension   Hyperlipidemia   COPD (chronic obstructive pulmonary disease) (HCC)   Acute metabolic encephalopathy   Bladder mass   Pressure injury of skin   Acute respiratory failure with hypercapnia (HCC)    Acute respiratory failure/T-max 101.5,/mild leukocytosis -Multifactorial, patient is bedbound, h/o COPD, possible aspiration pneumonia -Requiring 5-40s of oxygen, currently maintaining O2 sat of 93% -Possible aspiration pneumonia, chest x-ray inconsistent due to body habitus and patient is bedbound -Since family has not come to conclusion comfort care, will initiate full spectrum antibiotics of Zosyn -We will obtain blood cultures  -We will initiate 1 dose of  Lasix 40 mg IV -DuoNeb bronchodilators as needed--RT consulted -We will not obtain ABGs as we do not anticipate intubation or change patient status-goals of care at this point DNR-DNI  Acute kidney injury/failure multifactorial exacerbated by rhabdomyolysis -Continue conservative management -Somewhat improvement BUN/creatinine,  -Creatinine as high as > 8.02  >>>> 7.1, 6.13, 4.5, 3.0 today -BUN 124, 130, 116, 88, 62 today,  GFR 8, 9, 13, 21 today (With fluid resuscitation patient goes into respiratory failure due to organ dysfunction including Longs and Heart..H/o COPD)   -Progressively declining physically and mentally   -Continue to have decreased p.o. intake in the setting of ongoing diuretic use and lisinopril. -Imaging of the abdomen did not reveal any hydronephrosis  -Multiple nephrologist has been following closely--determined that patient is not a candidate for hemodialysis.  Due to his multiple comorbidities..  Low function, including bedbound.. Prognosis deemed poor  -Family has not chosen hospice, comfort care measures at this point At this rate of progressively physical and mental decline anticipating in-hospital death  Note: Records indicate the patient was under hospice care at home prior to admission due to multiple comorbidities - Bedbound, poor function at baseline      -Status post IV fluid resuscitation, subsequently discontinued -Nephrology following, monitoring I's and O's, Foley catheter was  discontinue   Transaminitis 2/2 rhabdomyolysis --improving -Trending down CK  Bladder outlet obstruction -Status post Foley catheter -monitoring strict I's and O's in setting of renal failure -Continue for now and will require outpatient urology evaluation for void trial   Acute toxic-metabolic encephalopathy -Worsening mental status-likely related to hypoxia, acute respiratory failure, ongoing renal failure, infection -superimposed by meds         -  TSH, and  ammonia within normal limits  -Holding sedating meds   Hypertension -Holding BP meds, blood pressure remained soft -Blood pressure remained soft   Parkinson's disease-along with dementia -On Sinemet --progressively worsening mental status  Hyperlipidemia -On statin, anticipating to hold   Bladder mass -Appears to have been present on CT imaging from 2019 and appears unchanged -This can be followed outpatient by urology   COPD -No wheezing, mild crackles or rhonchi's noted, O2 demand has increased to 5-4 L of oxygen by nasal cannula--currently satting 93% -Continue as needed bronchodilators   Sever Generalized weakness -bedbound -last Physical therapy evaluation ordered 11/8 -Patient is currently bedbound, can only be moved with Harrel Lemon lift -Patient has a severe global weakness, per nursing staff able to move with a [facility]-comfort care, hospice, hospice home home   Developed pressure ulcers on the heels, ..  Please see nurses note for details Continue wound care, canary bed provided, continuing daily wound care  Goals of care -Palliative care following, extensive family meeting with both sons Last family meeting 07/09/2021 -Family not on board with comfort care, hospice yet  Willing to entertain hospice home idea for now -Palliative care and transition of care --following  -Family has agreed to DNR/DNI status     11/10 11/11 - 11/12, 11/13 -POA Mr. Matas Burrows was present at bedside, discussed the above finding, plan of care options for disposition: Hospice home versus home with hospice services as before  He is to discuss the options with his other family members -not on board with comfort care measures or hospice yet   DVT prophylaxis: Heparin Code Status: DNR Family Communication: Continue discussing with family to determine goal of care POA Mr. Gumecindo Hopkin -updated 07/08/2021  Disposition Plan:  Status is: Inpatient   Remains inpatient  appropriate because: To determine goal of care, final disposition, home with hospice services, versus hospice home     Skin Assessment:   I have examined the patient's skin and I agree with the wound assessment as performed by the wound care RN as outlined below:   Pressure Injury 03/04/21 Sacrum Mid Stage 2 -  Partial thickness loss of dermis presenting as a shallow open injury with a red, pink wound bed without slough. (Active)  03/04/21 2245  Location: Sacrum  Location Orientation: Mid  Staging: Stage 2 -  Partial thickness loss of dermis presenting as a shallow open injury with a red, pink wound bed without slough.  Wound Description (Comments):   Present on Admission: Yes      Consultants:  Nephrology Palliative care   Procedures:  See below   Antimicrobials:  None  Objective: Vitals:   07/09/21 0504 07/09/21 0609 07/09/21 0619 07/09/21 0712  BP: 105/72  (!) 133/49   Pulse: 95  83   Resp: 20  20   Temp: (!) 100.6 F (38.1 C) (!) 101.5 F (38.6 C) 100.2 F (37.9 C)   TempSrc: Oral Axillary Oral   SpO2: 92%  95% 93%  Weight:      Height:        Intake/Output Summary (Last 24 hours) at 07/09/2021 1055 Last data filed at 07/09/2021 0148 Gross per 24 hour  Intake 1280 ml  Output 3100 ml  Net -1820 ml   Filed Weights   06/27/21 1109 06/27/21 1546  Weight: 90.7 kg 81.2 kg      Physical Exam:   General:  Lethargic, arousable, follows some command,  HEENT:  Normocephalic, PERRL, otherwise  with in Normal limits   Neuro:  CNII-XII intact. , normal motor and sensation, reflexes intact   Lungs:   Clear to auscultation BL, Respirations unlabored, no wheezes / crackles  Cardio:    S1/S2, RRR, No murmure, No Rubs or Gallops   Abdomen:   Soft, non-tender, bowel sounds active all four quadrants,  no guarding or peritoneal signs.  Muscular skeletal:  Limited exam - in bed, able to move all 4 extremities, severe global generalized weaknesses, bedbound unable to move  his lower extremities 2+ pulses,  symmetric, No pitting edema  Skin:  Dry, warm to touch, negative for any Rashes, positive for pressure ulcers as mentioned below  Wounds: Please see nursing documentation  Pressure Injury 03/04/21 Sacrum Mid Stage 2 -  Partial thickness loss of dermis presenting as a shallow open injury with a red, pink wound bed without slough. (Active)  03/04/21 2245  Location: Sacrum  Location Orientation: Mid  Staging: Stage 2 -  Partial thickness loss of dermis presenting as a shallow open injury with a red, pink wound bed without slough.  Wound Description (Comments):   Present on Admission: Yes     Pressure Injury 06/28/21 Heel Left Deep Tissue Pressure Injury - Purple or maroon localized area of discolored intact skin or blood-filled blister due to damage of underlying soft tissue from pressure and/or shear. (Active)  06/28/21 0800  Location: Heel  Location Orientation: Left  Staging: Deep Tissue Pressure Injury - Purple or maroon localized area of discolored intact skin or blood-filled blister due to damage of underlying soft tissue from pressure and/or shear.  Wound Description (Comments):   Present on Admission: Yes     Pressure Injury 06/28/21 Buttocks Left Deep Tissue Pressure Injury - Purple or maroon localized area of discolored intact skin or blood-filled blister due to damage of underlying soft tissue from pressure and/or shear. (Active)  06/28/21 0800  Location: Buttocks  Location Orientation: Left  Staging: Deep Tissue Pressure Injury - Purple or maroon localized area of discolored intact skin or blood-filled blister due to damage of underlying soft tissue from pressure and/or shear.  Wound Description (Comments):   Present on Admission: Yes     Pressure Injury 07/02/21 Rectum Mid Stage 2 -  Partial thickness loss of dermis presenting as a shallow open injury with a red, pink wound bed without slough. skin red, open, small amount of drainage (Active)   07/02/21 0845  Location: Rectum  Location Orientation: Mid  Staging: Stage 2 -  Partial thickness loss of dermis presenting as a shallow open injury with a red, pink wound bed without slough.  Wound Description (Comments): skin red, open, small amount of drainage  Present on Admission:                 Data Reviewed: I have personally reviewed following labs and imaging studies  CBC: Recent Labs  Lab 07/05/21 0445 07/06/21 0549 07/07/21 0431 07/08/21 0357 07/09/21 0438  WBC 9.3 9.5 10.4 10.6* 14.8*  HGB 9.9* 8.9* 9.2* 9.7* 10.2*  HCT 29.1* 27.3* 28.3* 29.2* 30.4*  MCV 96.7 98.2 99.3 99.0 98.1  PLT 198 218 243 264 621   Basic Metabolic Panel: Recent Labs  Lab 07/05/21 0445 07/06/21 0549 07/07/21 0431 07/08/21 0357 07/09/21 0438  NA 139 142  142 146* 146* 142  K 4.4 4.0  4.0 3.7 3.2* 3.3*  CL 100 105  105 109 111 106  CO2 23 22  22 24 23 24   GLUCOSE 88  90  90 93 111* 120*  BUN 130* 120*  118* 116* 88* 62*  CREATININE 8.02* 7.26*  7.10* 6.13* 4.50* 3.00*  CALCIUM 7.1* 7.4*  7.5* 7.9* 8.0* 7.9*  MG 2.1 2.1 2.1 2.0 1.7  PHOS 7.8* 7.2*  --   --   --    GFR: Estimated Creatinine Clearance: 21.2 mL/min (A) (by C-G formula based on SCr of 3 mg/dL (H)). Liver Function Tests: Recent Labs  Lab 07/03/21 0339 07/04/21 0516 07/05/21 0445 07/06/21 0549  AST 142* 96*  --   --   ALT 12 13  --   --   ALKPHOS 80 87  --   --   BILITOT 0.8 0.8  --   --   PROT 4.6* 5.1*  --   --   ALBUMIN 1.8* 2.0* 2.0* 2.2*   No results for input(s): LIPASE, AMYLASE in the last 168 hours. No results for input(s): AMMONIA in the last 168 hours.  Coagulation Profile: No results for input(s): INR, PROTIME in the last 168 hours. Cardiac Enzymes: Recent Labs  Lab 07/05/21 0445 07/06/21 0549 07/07/21 0431 07/08/21 0357 07/09/21 0438  CKTOTAL 6,250* 4,714* 3,759* 2,647* 1,848*       Radiology Studies: [facility] Chest Port 1 View  Result Date: 07/09/2021 CLINICAL DATA:   75 year old male with shortness of breath. EXAM: PORTABLE CHEST - 1 VIEW COMPARISON:  07/07/2021 FINDINGS: The mediastinal contours are within normal limits. Unchanged mild cardiomegaly. Low lung volumes. Bibasilar subsegmental atelectasis, slightly more conspicuous than comparison. No pleural effusion or pneumothorax. No acute osseous abnormality. IMPRESSION: Low lung volumes bibasilar subsegmental atelectasis. Electronically Signed   By: Ruthann Cancer M.D.   On: 07/09/2021 10:10   DG CHEST PORT 1 VIEW  Result Date: 07/07/2021 CLINICAL DATA:  Fluid overload EXAM: PORTABLE CHEST 1 VIEW COMPARISON:  07/03/2021, 06/27/2021, 12/21/2019 FINDINGS: Low lung volumes. Borderline cardiomegaly. Mild bronchovascular crowding due to low lung volume. No pleural effusion or pneumothorax. IMPRESSION: Hypoventilatory changes with borderline cardiomegaly Electronically Signed   By: Donavan Foil M.D.   On: 07/07/2021 23:09        Scheduled Meds:  albuterol  2.5 mg Nebulization Q4H   ALPRAZolam  1 mg Oral QHS   aspirin EC  81 mg Oral Daily   carbidopa-levodopa  1 tablet Oral TID   Chlorhexidine Gluconate Cloth  6 each Topical Daily   gabapentin  100 mg Oral BID   heparin  5,000 Units Subcutaneous Q8H   nystatin   Topical BID   sodium bicarbonate  650 mg Oral BID   Continuous Infusions:   LOS: 11 days    Time spent: 55 minutes    Deatra James, MD [facility]  If 7PM-7AM, please contact night-coverage [email or web address] 07/09/2021, 10:55 AM

## 2021-07-09 NOTE — Progress Notes (Addendum)
BP 87/59 temp 100.7. Patient more responsive and asked for water, consumed majority of cup. Notified MD, awaiting orders.   2329-New orders given for another 500cc bolus. Will continue to monitor

## 2021-07-09 NOTE — Progress Notes (Addendum)
Notified by NT of low BP and fever, re-checked with below readings. Patient also more lethargic and less responsive than previous assessment. Notified charge nurse Grace Isaac to make aware and MD A. Zierle-Ghosh. New orders for 55ml bolus and tylenol. Initiated bolus and suppository administered. Placed in trendelenberg position and BP improved. Will continue to monitor.   07/09/21 2112  Assess: MEWS Score  Temp (!) 103.2 F (39.6 C)  BP (!) 81/46  Pulse Rate 78  Resp 20  SpO2 94 %  O2 Device Nasal Cannula  O2 Flow Rate (L/min) 4.5 L/min  Assess: MEWS Score  MEWS Temp 2  MEWS Systolic 1  MEWS Pulse 0  MEWS RR 0  MEWS LOC 0  MEWS Score 3  MEWS Score Color Yellow  Assess: if the MEWS score is Yellow or Red  Were vital signs taken at a resting state? Yes  Focused Assessment Change from prior assessment (see assessment flowsheet)  Early Detection of Sepsis Score *See Row Information* High  MEWS guidelines implemented *See Row Information* Yes  Treat  MEWS Interventions Escalated (See documentation below);Administered prn meds/treatments  Take Vital Signs  Increase Vital Sign Frequency  Yellow: Q 2hr X 2 then Q 4hr X 2, if remains yellow, continue Q 4hrs  Escalate  MEWS: Escalate Yellow: discuss with charge nurse/RN and consider discussing with provider and RRT  Notify: Charge Nurse/RN  Name of Charge Nurse/RN Notified Grace Isaac, RN  Date Charge Nurse/RN Notified 07/09/21  Time Charge Nurse/RN Notified 2113  Notify: Provider  Provider Name/Title Ander Slade  Date Provider Notified 07/09/21  Time Provider Notified 2114  Notification Type Page  Notification Reason Change in status  Provider response See new orders  Date of Provider Response 07/09/21  Time of Provider Response 2114  Document  Patient Outcome Not stable and remains on department

## 2021-07-09 NOTE — Progress Notes (Signed)
At 05:04 patient's oral temp was 100.6 degrees Fehrenheit,  gave patient 650mg  of tylenol PO. Checked orally temperature 100.2 degrees Fahrenheit at 06:19. Catering manager.

## 2021-07-09 NOTE — Progress Notes (Signed)
Pharmacy Antibiotic Note  Lucas [name] is a 75 y.o. male admitted on 06/27/2021 with    .  Pharmacy has been consulted for zosyn dosing. Cxray, no changes( atelectasis). AKI is improving. Tmax 101.5. Possible Aspiration  PNA Plan: Zosyn 3.375g IV q8h (4 hour infusion). F/u clinical progress Monitor V/S, labs  Height: 5\' 8"  (172.7 cm) Weight: 81.2 kg (179 lb 0.2 oz) IBW/kg (Calculated) : 68.4  Temp (24hrs), Avg:100 F (37.8 C), Min:97.7 F (36.5 C), Max:101.5 F (38.6 C)  Recent Labs  Lab 07/05/21 0445 07/06/21 0549 07/07/21 0431 07/08/21 0357 07/08/21 0822 07/09/21 0438  WBC 9.3 9.5 10.4 10.6*  --  14.8*  CREATININE 8.02* 7.26*  7.10* 6.13* 4.50*  --  3.00*  LATICACIDVEN  --   --   --   --  0.9  --     Estimated Creatinine Clearance: 21.2 mL/min (A) (by C-G formula based on SCr of 3 mg/dL (H)).    No Known Allergies  Antimicrobials this admission: zosyn 11/13 >>   Microbiology results: [facility] cultures  Thank you for allowing pharmacy to be a part of this patient's care.  Isac Sarna, BS [place], [place] Clinical Pharmacist Pager [phone] 07/09/2021 11:37 AM

## 2021-07-10 DIAGNOSIS — N189 Chronic kidney disease, unspecified: Secondary | ICD-10-CM | POA: Diagnosis not present

## 2021-07-10 DIAGNOSIS — N3289 Other specified disorders of bladder: Secondary | ICD-10-CM | POA: Diagnosis not present

## 2021-07-10 DIAGNOSIS — G20 Parkinson's disease: Secondary | ICD-10-CM | POA: Diagnosis not present

## 2021-07-10 DIAGNOSIS — Z7189 Other specified counseling: Secondary | ICD-10-CM | POA: Diagnosis not present

## 2021-07-10 DIAGNOSIS — N179 Acute kidney failure, unspecified: Secondary | ICD-10-CM | POA: Diagnosis not present

## 2021-07-10 LAB — BLOOD CULTURE ID PANEL (REFLEXED) - BCID2

## 2021-07-10 LAB — BASIC METABOLIC PANEL
Anion gap: 9 (ref 5–15)
BUN: 49 mg/dL — ABNORMAL HIGH (ref 8–23)
CO2: 23 mmol/L (ref 22–32)
Calcium: 7.7 mg/dL — ABNORMAL LOW (ref 8.9–10.3)
Chloride: 111 mmol/L (ref 98–111)
Creatinine, Ser: 2.32 mg/dL — ABNORMAL HIGH (ref 0.61–1.24)
GFR, Estimated: 29 mL/min — ABNORMAL LOW (ref >60)
Glucose, Bld: 106 mg/dL — ABNORMAL HIGH (ref 70–99)
Potassium: 3.2 mmol/L — ABNORMAL LOW (ref 3.5–5.1)
Sodium: 143 mmol/L (ref 135–145)

## 2021-07-10 LAB — CBC
HCT: 29.1 % — ABNORMAL LOW (ref 39.0–52.0)
Hemoglobin: 9.3 g/dL — ABNORMAL LOW (ref 13.0–17.0)
MCH: 32.5 pg (ref 26.0–34.0)
MCHC: 32 g/dL (ref 30.0–36.0)
MCV: 101.7 fL — ABNORMAL HIGH (ref 80.0–100.0)
Platelets: 290 10*3/uL (ref 150–400)
RBC: 2.86 MIL/uL — ABNORMAL LOW (ref 4.22–5.81)
RDW: 13.4 % (ref 11.5–15.5)
WBC: 11.6 10*3/uL — ABNORMAL HIGH (ref 4.0–10.5)
nRBC: 0 % (ref 0.0–0.2)

## 2021-07-10 LAB — MAGNESIUM: Magnesium: 1.8 mg/dL (ref 1.7–2.4)

## 2021-07-10 LAB — CK: Total CK: 1231 U/L — ABNORMAL HIGH (ref 49–397)

## 2021-07-10 MED ORDER — POTASSIUM CHLORIDE CRYS ER 20 MEQ PO TBCR
40.0000 meq | EXTENDED_RELEASE_TABLET | Freq: Once | ORAL | Status: AC
  Administered 2021-07-10: 40 meq via ORAL
  Filled 2021-07-10: qty 2

## 2021-07-10 MED ORDER — VANCOMYCIN HCL 1000 MG/200ML IV SOLN: 1000.0000 mg | INTRAVENOUS | Status: DC

## 2021-07-10 MED ORDER — ALUM & MAG HYDROXIDE-SIMETH 200-200-20 MG/5ML PO SUSP
15.0000 mL | ORAL | Status: DC | PRN
  Administered 2021-07-10: 15 mL via ORAL
  Filled 2021-07-10: qty 30

## 2021-07-10 MED ORDER — VANCOMYCIN HCL 1750 MG/350ML IV SOLN
1750.0000 mg | Freq: Once | INTRAVENOUS | Status: AC
  Administered 2021-07-10: 1750 mg via INTRAVENOUS
  Filled 2021-07-10 (×2): qty 350

## 2021-07-10 MED ORDER — HYDROMORPHONE HCL 1 MG/ML IJ SOLN
0.5000 mg | INTRAMUSCULAR | Status: DC | PRN
  Administered 2021-07-10 – 2021-07-20 (×8): 0.5 mg via INTRAVENOUS
  Filled 2021-07-10 (×8): qty 0.5

## 2021-07-10 NOTE — Progress Notes (Signed)
Palliative: [name], Lucas [name], is lying quietly in bed.  He greets me, making and mostly keeping eye contact.  He appears acutely/chronically ill and frail.  Although he is alert and oriented to person and situation, he is unable to tell me the month.  I do not believe that he has a true grasp/understanding of the severity of his illnesses.  He continues to state that he is able to return home and care for himself which is clearly not so.  Call to son Lucas [name] who tells me that family is electing residential hospice.  Provider choice offered.  Lucas [name] states that their preference is [facility] [place] [facility].  We talked about what is and is not provided at residential hospice.  Conference with attending, bedside nursing staff, transition of care team related to patient condition, needs, goals of care, disposition. [place] form completed.  Plan:   Family is requesting residential hospice with [facility].  We talked about what is and is not provided in residential hospice.  TOC working for disposition.         99 minutes  Lucas Axe, NP Palliative medicine team Team phone [phone] Greater than 50% of this time was spent counseling and coordinating care related to the above assessment and plan.

## 2021-07-10 NOTE — Progress Notes (Signed)
PROGRESS NOTE    Lucas [name]  ERD:[id number] DOB: Jul 16, 1946 DOA: 06/27/2021 PCP: Alfonse Flavors, MD    Subjective: The patient was seen and examined this morning, lethargic but easily arousable... Mentation better than yesterday, following commands. Still 4 L of oxygen, satting 96%   Brief Narrative:   Lucas [name] is a 75 y.o. male with medical history significant of Parkinson's disease, prior CVA, COPD, tobacco use, hypertension, chronic lower extremity edema, hyperlipidemia, normally lives at home.  Family had brought him to ED due to what appears to be a chronic decline in his overall condition with confusion.  Patient was admitted for acute metabolic encephalopathy and associated acute kidney injury that appears to be due to rhabdomyolysis.  Patient continues to require aggressive IV fluids per nephrology.  Continue to monitor creatinine trend and follow for outcomes.  Nephrology and palliative care following.     Assessment & Plan:   Principal Problem:   Acute on chronic renal failure (HCC) Active Problems:   Parkinson's disease (HCC)   Generalized weakness   Essential hypertension   Hyperlipidemia   COPD (chronic obstructive pulmonary disease) (HCC)   Acute metabolic encephalopathy   Bladder mass   Pressure injury of skin   Acute respiratory failure with hypercapnia (HCC)    Acute respiratory failure/T-max 101.5,/mild leukocytosis -Afebrile normotensive improved leukocytosis  -Multifactorial, patient is bedbound, h/o COPD, possible aspiration pneumonia -Still requiring 4-5 L of oxygen, maintaining O2 sat of 96%, -Possible aspiration pneumonia, chest x-ray inconsistent due to body habitus and patient is bedbound -Since family has not come to conclusion comfort care, continue current IV antibiotics of Zosyn (initiated 07/09/2021) -We will obtain blood cultures  -Last dose of Lasix 40 mg IV 07/09/2021 -DuoNeb bronchodilators as needed--RT  consulted -We will not obtain ABGs as we do not anticipate intubation or change patient status-goals of care at this point DNR-DNI  Acute kidney injury/failure multifactorial exacerbated by rhabdomyolysis -Continue conservative management -Somewhat improvement BUN/creatinine,  -Creatinine as high as > 8.02  >>>> 7.1, 6.13, 4.5, 3.0  >> 2.3 today -BUN 124, 130, 116, 88, 62, 49 GFR 8, 9, 13, 21, 29  today (With fluid resuscitation patient goes into respiratory failure due to organ dysfunction including Lung and Heart..H/o COPD)   -Progressively declining physically and mentally   -Lisinopril has been discontinued, -Imaging of the abdomen did not reveal any hydronephrosis  -Multiple nephrologist has been following closely--determined that patient is not a candidate for hemodialysis.  Due to his multiple comorbidities..  Low function, including bedbound.. Prognosis deemed poor -Nephrologist has signed off 07/10/2021   -Family has not chosen hospice, comfort care measures at this point At this rate of progressively physical and mental decline anticipating in-hospital death  Note: Records indicate the patient was under hospice care at home prior to admission due to multiple comorbidities - Bedbound, poor function at baseline      -Status post IV fluid resuscitation, subsequently discontinued -Monitoring I's and O's, Foley catheter was  discontinue -I's and O's not accurate   Transaminitis 2/2 rhabdomyolysis --continue to improve -Trending down CK  Bladder outlet obstruction -Status post Foley catheter -monitoring strict I's and O's in setting of renal failure -Continue for now and will require outpatient urology evaluation for void trial   Acute toxic-metabolic encephalopathy -Worsening mental status-likely related to hypoxia, acute respiratory failure, ongoing renal failure, infection -superimposed by meds         -TSH, and ammonia within normal limits  -Holding sedating  meds   Hypertension -Holding BP meds, blood pressure remained soft,  -Discontinue lisinopril due to hypotension elevated creatinine -Blood pressure remained soft   Parkinson's disease-along with dementia -On Sinemet --  Hyperlipidemia -On statin, anticipating to hold   Bladder mass -Appears to have been present on CT imaging from 2019 and appears unchanged -This can be followed outpatient by urology   COPD -Mild bilateral rhonchi with minimal lower lobes crackles, poor air exchange mid to lower lobes -We will continue supplemental oxygen, maintain O2 sat greater 90% -Continue as needed bronchodilators   Sever Generalized weakness -bedbound -last Physical therapy evaluation ordered 11/8 -Patient is currently bedbound, can only be moved with Harrel Lemon lift -Patient has a severe global weakness, per nursing staff able to move with a [facility] SNF-comfort care, hospice, hospice home home   Pressure ulcers on the heels, buttocks area..  Please see nurses note for details Continue wound care, canary bed provided, continuing daily wound care  Goals of care -Palliative care following, extensive family meeting with both sons Last family meeting 07/09/2021 -Family not on board with comfort care, hospice yet  Willing to entertain hospice home idea for now -Palliative care and transition of care --following  -Family has agreed to DNR/DNI status     11/10 11/11 - 11/12, 11/13 -POA [name] was present at bedside, discussed the above finding, plan of care options for disposition: Hospice home versus home with hospice services as before  He is to discuss the options with his other family members -not on board with comfort care measures or hospice yet   DVT prophylaxis: Heparin Code Status: DNR Family Communication: Continue discussing with family to determine goal of care POA [name] -updated 07/08/2021  Disposition Plan:  Status is: Inpatient    Remains inpatient appropriate because: To determine goal of care, final disposition, home with hospice services, versus hospice home     Skin Assessment:   I have examined the patient's skin and I agree with the wound assessment as performed by the wound care RN as outlined below:   Pressure Injury 03/04/21 Sacrum Mid Stage 2 -  Partial thickness loss of dermis presenting as a shallow open injury with a red, pink wound bed without slough. (Active)  03/04/21 2245  Location: Sacrum  Location Orientation: Mid  Staging: Stage 2 -  Partial thickness loss of dermis presenting as a shallow open injury with a red, pink wound bed without slough.  Wound Description (Comments):   Present on Admission: Yes      Consultants:  Nephrology Palliative care   Procedures:  See below   Antimicrobials:  None  Objective: Vitals:   07/10/21 0235 07/10/21 0458 07/10/21 0535 07/10/21 0844  BP: (!) 99/58 114/82    Pulse: 78 76  81  Resp: 20 18  20   Temp: 99.1 F (37.3 C) 99.5 F (37.5 C)    TempSrc: Oral Oral    SpO2: 96% 98% 96% 96%  Weight:      Height:        Intake/Output Summary (Last 24 hours) at 07/10/2021 1053 Last data filed at 07/10/2021 0300 Gross per 24 hour  Intake 1494.77 ml  Output --  Net 1494.77 ml   Filed Weights   06/27/21 1109 06/27/21 1546  Weight: 90.7 kg 81.2 kg       Physical Exam:   General:  Lethargic but arousable, no distress;   HEENT:  Normocephalic, PERRL, otherwise with in Normal limits  Neuro:  CNII-XII intact. ,  Significant reduction of bilateral lower motor function  Lungs:   Clear to auscultation BL, Respirations unlabored, no wheezes / crackles  Cardio:    S1/S2, RRR, No murmure, No Rubs or Gallops   Abdomen:   Soft, non-tender, bowel sounds active all four quadrants,  no guarding or peritoneal signs.  Muscular skeletal:  Limited exam - in bed, able moving upper extremities, no motor function noted on lower extremities, 2+ pulses,   symmetric, ++2  pitting edema  Skin:  Dry, warm to touch, negative for any Rashes,  Wounds: Please see nursing documentation  Pressure Injury 03/04/21 Sacrum Mid Stage 2 -  Partial thickness loss of dermis presenting as a shallow open injury with a red, pink wound bed without slough. (Active)  03/04/21 2245  Location: Sacrum  Location Orientation: Mid  Staging: Stage 2 -  Partial thickness loss of dermis presenting as a shallow open injury with a red, pink wound bed without slough.  Wound Description (Comments):   Present on Admission: Yes     Pressure Injury 06/28/21 Heel Left Deep Tissue Pressure Injury - Purple or maroon localized area of discolored intact skin or blood-filled blister due to damage of underlying soft tissue from pressure and/or shear. (Active)  06/28/21 0800  Location: Heel  Location Orientation: Left  Staging: Deep Tissue Pressure Injury - Purple or maroon localized area of discolored intact skin or blood-filled blister due to damage of underlying soft tissue from pressure and/or shear.  Wound Description (Comments):   Present on Admission: Yes     Pressure Injury 06/28/21 Buttocks Left Deep Tissue Pressure Injury - Purple or maroon localized area of discolored intact skin or blood-filled blister due to damage of underlying soft tissue from pressure and/or shear. (Active)  06/28/21 0800  Location: Buttocks  Location Orientation: Left  Staging: Deep Tissue Pressure Injury - Purple or maroon localized area of discolored intact skin or blood-filled blister due to damage of underlying soft tissue from pressure and/or shear.  Wound Description (Comments):   Present on Admission: Yes     Pressure Injury 07/02/21 Rectum Mid Stage 2 -  Partial thickness loss of dermis presenting as a shallow open injury with a red, pink wound bed without slough. skin red, open, small amount of drainage (Active)  07/02/21 0845  Location: Rectum  Location Orientation: Mid  Staging: Stage 2  -  Partial thickness loss of dermis presenting as a shallow open injury with a red, pink wound bed without slough.  Wound Description (Comments): skin red, open, small amount of drainage  Present on Admission:                  Data Reviewed: I have personally reviewed following labs and imaging studies  CBC: Recent Labs  Lab 07/06/21 0549 07/07/21 0431 07/08/21 0357 07/09/21 0438 07/10/21 0345  WBC 9.5 10.4 10.6* 14.8* 11.6*  HGB 8.9* 9.2* 9.7* 10.2* 9.3*  HCT 27.3* 28.3* 29.2* 30.4* 29.1*  MCV 98.2 99.3 99.0 98.1 101.7*  PLT 218 243 264 316 397   Basic Metabolic Panel: Recent Labs  Lab 07/05/21 0445 07/06/21 0549 07/07/21 0431 07/08/21 0357 07/09/21 0438 07/10/21 0345  NA 139 142  142 146* 146* 142 143  K 4.4 4.0  4.0 3.7 3.2* 3.3* 3.2*  CL 100 105  105 109 111 106 111  CO2 23 22  22 24 23 24 23   GLUCOSE 88 90  90 93 111* 120* 106*  BUN 130*  120*  118* 116* 88* 62* 49*  CREATININE 8.02* 7.26*  7.10* 6.13* 4.50* 3.00* 2.32*  CALCIUM 7.1* 7.4*  7.5* 7.9* 8.0* 7.9* 7.7*  MG 2.1 2.1 2.1 2.0 1.7 1.8  PHOS 7.8* 7.2*  --   --   --   --    GFR: Estimated Creatinine Clearance: 27.4 mL/min (A) (by C-G formula based on SCr of 2.32 mg/dL (H)). Liver Function Tests: Recent Labs  Lab 07/04/21 0516 07/05/21 0445 07/06/21 0549  AST 96*  --   --   ALT 13  --   --   ALKPHOS 87  --   --   BILITOT 0.8  --   --   PROT 5.1*  --   --   ALBUMIN 2.0* 2.0* 2.2*   No results for input(s): LIPASE, AMYLASE in the last 168 hours. No results for input(s): AMMONIA in the last 168 hours.  Coagulation Profile: No results for input(s): INR, PROTIME in the last 168 hours. Cardiac Enzymes: Recent Labs  Lab 07/06/21 0549 07/07/21 0431 07/08/21 0357 07/09/21 0438 07/10/21 0345  CKTOTAL 4,714* 3,759* 2,647* 1,848* 1,231*       Radiology Studies: DG Chest Port 1 View  Result Date: 07/09/2021 CLINICAL DATA:  75 year old male with shortness of breath. EXAM:  PORTABLE CHEST - 1 VIEW COMPARISON:  07/07/2021 FINDINGS: The mediastinal contours are within normal limits. Unchanged mild cardiomegaly. Low lung volumes. Bibasilar subsegmental atelectasis, slightly more conspicuous than comparison. No pleural effusion or pneumothorax. No acute osseous abnormality. IMPRESSION: Low lung volumes bibasilar subsegmental atelectasis. Electronically Signed   By: Ruthann Cancer M.D.   On: 07/09/2021 10:10        Scheduled Meds:  albuterol  2.5 mg Nebulization Q4H   ALPRAZolam  1 mg Oral QHS   aspirin EC  81 mg Oral Daily   carbidopa-levodopa  1 tablet Oral TID   Chlorhexidine Gluconate Cloth  6 each Topical Daily   gabapentin  100 mg Oral BID   heparin  5,000 Units Subcutaneous Q8H   nystatin   Topical BID   sodium bicarbonate  650 mg Oral BID   Continuous Infusions:  piperacillin-tazobactam (ZOSYN)  IV 3.375 g (07/10/21 0608)    LOS: 12 days    Time spent: 53 minutes    Deatra James, MD [facility]  If 7PM-7AM, please contact night-coverage [email or web address] 07/10/2021, 10:53 AM

## 2021-07-10 NOTE — Progress Notes (Signed)
Subjective:  UOP not recorded today but was 4700 from 11/12-  crt down to 2.3 -  he is more alert, asks me to turn up his TV and says he is thirsty   Objective Vital signs in last 24 hours: Vitals:   07/10/21 0235 07/10/21 0458 07/10/21 0535 07/10/21 0844  BP: (!) 99/58 114/82    Pulse: 78 76  81  Resp: 20 18  20   Temp: 99.1 F (37.3 C) 99.5 F (37.5 C)    TempSrc: Oral Oral    SpO2: 96% 98% 96% 96%  Weight:      Height:       Weight change:   Intake/Output Summary (Last 24 hours) at 07/10/2021 0948 Last data filed at 07/10/2021 0300 Gross per 24 hour  Intake 1494.77 ml  Output --  Net 1494.77 ml    Assessment/ Plan: Pt is a 75 y.o. yo male with Parkinsons who was admitted on 06/27/2021 with FTT - also had AKI thought due to rhabdo Assessment/Plan: 1. AKI-  ( crt 06/09/21 1.24) in the setting of solitary kidney/rhabdo/pre renal injury/urinary retention- resolving-  IVF stopped on 11/10-  giving diuretics PRN- last given Sunday-  none needed today - is non oliguric.  determined to not be a dialysis candidate due to chronic comorbid conditions and decreased mobility but has not been a major issue as there are no indications for HD at present and kidney function is overall improved 2. HTN/volume-  hypotensive-  no BP meds-  likely still a little overloaded but think he will autodiurese   3. Anemia-  hgb fairly stable in the 9's-  have not treated  4. Dispo-  is DNR-  is stabilizing medically -  sounds as if family not on board with comfort care just yet 5. Hypokalemia- on repletion PRN 6. Metabolic acidosis-  cont sodium bicarb- can stop if bicarb gets over 25  As renal failure has improved-   renal will sign off-  does not need nephrology follow up at discharge   [place]: Basic Metabolic Panel: Recent Labs  Lab 07/05/21 0445 07/06/21 0549 07/07/21 0431 07/08/21 0357 07/09/21 0438 07/10/21 0345  NA 139 142  142   < > 146* 142 143  K 4.4 4.0   4.0   < > 3.2* 3.3* 3.2*  CL 100 105  105   < > 111 106 111  CO2 23 22  22    < > 23 24 23   GLUCOSE 88 90  90   < > 111* 120* 106*  BUN 130* 120*  118*   < > 88* 62* 49*  CREATININE 8.02* 7.26*  7.10*   < > 4.50* 3.00* 2.32*  CALCIUM 7.1* 7.4*  7.5*   < > 8.0* 7.9* 7.7*  PHOS 7.8* 7.2*  --   --   --   --    < > = values in this interval not displayed.   Liver Function Tests: Recent Labs  Lab 07/04/21 0516 07/05/21 0445 07/06/21 0549  AST 96*  --   --   ALT 13  --   --   ALKPHOS 87  --   --   BILITOT 0.8  --   --   PROT 5.1*  --   --   ALBUMIN 2.0* 2.0* 2.2*   No results for input(s): LIPASE, AMYLASE in the last 168 hours. No results for input(s): AMMONIA in the last 168 hours. CBC: Recent Labs  Lab  07/06/21 0549 07/07/21 0431 07/08/21 0357 07/09/21 0438 07/10/21 0345  WBC 9.5 10.4 10.6* 14.8* 11.6*  HGB 8.9* 9.2* 9.7* 10.2* 9.3*  HCT 27.3* 28.3* 29.2* 30.4* 29.1*  MCV 98.2 99.3 99.0 98.1 101.7*  PLT 218 243 264 316 290   Cardiac Enzymes: Recent Labs  Lab 07/06/21 0549 07/07/21 0431 07/08/21 0357 07/09/21 0438 07/10/21 0345  CKTOTAL 4,714* 3,759* 2,647* 1,848* 1,231*   CBG: No results for input(s): GLUCAP in the last 168 hours.  Iron Studies: No results for input(s): IRON, TIBC, TRANSFERRIN, FERRITIN in the last 72 hours. Studies/Results: DG Chest Port 1 View  Result Date: 07/09/2021 CLINICAL DATA:  75 year old male with shortness of breath. EXAM: PORTABLE CHEST - 1 VIEW COMPARISON:  07/07/2021 FINDINGS: The mediastinal contours are within normal limits. Unchanged mild cardiomegaly. Low lung volumes. Bibasilar subsegmental atelectasis, slightly more conspicuous than comparison. No pleural effusion or pneumothorax. No acute osseous abnormality. IMPRESSION: Low lung volumes bibasilar subsegmental atelectasis. Electronically Signed   By: Ruthann Cancer M.D.   On: 07/09/2021 10:10   Medications: Infusions:  piperacillin-tazobactam (ZOSYN)  IV 3.375 g  (07/10/21 6168)    Scheduled Medications:  albuterol  2.5 mg Nebulization Q4H   ALPRAZolam  1 mg Oral QHS   aspirin EC  81 mg Oral Daily   carbidopa-levodopa  1 tablet Oral TID   Chlorhexidine Gluconate Cloth  6 each Topical Daily   gabapentin  100 mg Oral BID   heparin  5,000 Units Subcutaneous Q8H   nystatin   Topical BID   potassium chloride  40 mEq Oral Once   sodium bicarbonate  650 mg Oral BID    have reviewed scheduled and prn medications.  Physical Exam: General: lying in bed-  very poor mobility-  knows Forestine Na and wants his TV turned up  Heart: RRR Lungs: mostly clear Abdomen: soft, non tender Extremities: multiple abrasions- dep edema-  scrotal edema     07/10/2021,9:48 AM  LOS: 12 days

## 2021-07-10 NOTE — Progress Notes (Addendum)
Date and time results received: 07/10/21 2300 (use smartphrase ".now" to insert current time)  Test: blood cultures Critical Value: aerobic positive Staph species, no resistance   Name of Provider Notified: Dr. Carlynn Purl  Orders Received? Or Actions Taken?: Actions Taken: MD on call notified

## 2021-07-10 NOTE — Progress Notes (Addendum)
Pharmacy Antibiotic Note  Lucas [name] is a 75 y.o. male admitted on 06/27/2021 with    .  Pharmacy has been consulted for zosyn dosing. Cxray, no changes( atelectasis). AKI is improving. Tmax 101.5. Possible Aspiration and bacteremia    Plan: Zosyn 3.375g IV q8h (4 hour infusion). Vancomycin 1.75gm iv x1, vancomycin 1000mg  iv q24h  F/u clinical progress Monitor V/S, labs  Height: 5\' 8"  (172.7 cm) Weight: 81.2 kg (179 lb 0.2 oz) IBW/kg (Calculated) : 68.4  Temp (24hrs), Avg:100.6 F (38.1 C), Min:98.9 F (37.2 C), Max:103.2 F (39.6 C)  Recent Labs  Lab 07/06/21 0549 07/07/21 0431 07/08/21 0357 07/08/21 0822 07/09/21 0438 07/10/21 0345  WBC 9.5 10.4 10.6*  --  14.8* 11.6*  CREATININE 7.26*  7.10* 6.13* 4.50*  --  3.00* 2.32*  LATICACIDVEN  --   --   --  0.9  --   --      Estimated Creatinine Clearance: 27.4 mL/min (A) (by C-G formula based on SCr of 2.32 mg/dL (H)).    No Known Allergies  Antimicrobials this admission: zosyn 11/13 >>  Vancomycin 11/14>>   Microbiology results: 11/14 Bcx: Gram + cocci  11/1 resp panel: negative   Thank you for allowing pharmacy to be a part of this patient's care.  Thomasenia Sales, PharmD, [facility] Clinical Pharmacist  07/10/2021 3:46 PM

## 2021-07-10 NOTE — TOC Progression Note (Signed)
Transition of Care [facility]) - Progression Note    Patient Details  Name: Lucas [name] MRN: [id number] Date of Birth: 1945/12/28  Transition of [facility]) CM/SW Contact  Salome Arnt, [place] Phone Number: 07/10/2021, 3:42 PM  Clinical Narrative:  LCSW had extensive conversations with sons, Audry Pili and Ronalee Belts. Both continue to consider decisions. Per Merrilyn Puma at [facility], pt was approved by [employer] [facility] for SNF over the weekend. Merrilyn Puma was going to find out how long authorization was good for. LCSW left two voicemails this afternoon, but no return call. Discussed all options again with sons. LCSW called [place], [place] and was told no local residential hospices. Palliative also following and discussing with sons. One son appears to be leaning towards SNF and the other hospice. TOC will follow up in AM.     Expected Discharge Plan: [place] Barriers to Discharge: Continued Medical Work up  Expected Discharge Plan and Services Expected Discharge Plan: [place] In-house Referral: Clinical Social Work   Post Acute Care Choice: [place] Living arrangements for the past 2 months: Single Family Home                                       Social Determinants of Health (SDOH) Interventions    Readmission Risk Interventions Readmission Risk Prevention Plan 07/04/2021 06/29/2021 03/28/2020  Transportation Screening Complete Complete Complete  Home Care Screening Complete Complete Complete  Medication Review (RN CM) Complete - Complete  Some recent data might be hidden

## 2021-07-10 NOTE — Progress Notes (Signed)
Date and time results received: 07/10/21 1516 (use smartphrase ".now" to insert current time)  Test: blood cultures Critical Value: Gram (+) cocci  Name of Provider Notified: Shahmed[place]  Orders Received? Or Actions Taken?: See orders

## 2021-07-10 NOTE — Progress Notes (Signed)
Nutrition Brief Note  Chart reviewed. RD drawn to patient due to Low Braden score.  Pt transitioned to comfort care. He was receiving Hospice services prior to this admission. No further nutrition interventions planned at this time.  Please re-consult as needed.   Colman Cater MS,RD,CSG,LDN Contact: Shea Evans

## 2021-07-11 DIAGNOSIS — R531 Weakness: Secondary | ICD-10-CM

## 2021-07-11 DIAGNOSIS — J449 Chronic obstructive pulmonary disease, unspecified: Secondary | ICD-10-CM

## 2021-07-11 DIAGNOSIS — E785 Hyperlipidemia, unspecified: Secondary | ICD-10-CM

## 2021-07-11 DIAGNOSIS — N179 Acute kidney failure, unspecified: Secondary | ICD-10-CM | POA: Diagnosis not present

## 2021-07-11 DIAGNOSIS — Z7189 Other specified counseling: Secondary | ICD-10-CM | POA: Diagnosis not present

## 2021-07-11 DIAGNOSIS — N3289 Other specified disorders of bladder: Secondary | ICD-10-CM

## 2021-07-11 DIAGNOSIS — N189 Chronic kidney disease, unspecified: Secondary | ICD-10-CM

## 2021-07-11 DIAGNOSIS — J9602 Acute respiratory failure with hypercapnia: Secondary | ICD-10-CM | POA: Diagnosis not present

## 2021-07-11 DIAGNOSIS — Z515 Encounter for palliative care: Secondary | ICD-10-CM | POA: Diagnosis not present

## 2021-07-11 DIAGNOSIS — L89300 Pressure ulcer of unspecified buttock, unstageable: Secondary | ICD-10-CM

## 2021-07-11 DIAGNOSIS — G20 Parkinson's disease: Secondary | ICD-10-CM | POA: Diagnosis not present

## 2021-07-11 LAB — CK: Total CK: 822 U/L — ABNORMAL HIGH (ref 49–397)

## 2021-07-11 LAB — BASIC METABOLIC PANEL
Anion gap: 8 (ref 5–15)
BUN: 35 mg/dL — ABNORMAL HIGH (ref 8–23)
CO2: 23 mmol/L (ref 22–32)
Calcium: 8 mg/dL — ABNORMAL LOW (ref 8.9–10.3)
Chloride: 105 mmol/L (ref 98–111)
Creatinine, Ser: 1.71 mg/dL — ABNORMAL HIGH (ref 0.61–1.24)
GFR, Estimated: 42 mL/min — ABNORMAL LOW (ref >60)
Glucose, Bld: 112 mg/dL — ABNORMAL HIGH (ref 70–99)
Potassium: 3 mmol/L — ABNORMAL LOW (ref 3.5–5.1)
Sodium: 136 mmol/L (ref 135–145)

## 2021-07-11 LAB — MAGNESIUM: Magnesium: 1.7 mg/dL (ref 1.7–2.4)

## 2021-07-11 MED ORDER — POTASSIUM CHLORIDE CRYS ER 20 MEQ PO TBCR
40.0000 meq | EXTENDED_RELEASE_TABLET | Freq: Once | ORAL | Status: AC
  Administered 2021-07-11: 40 meq via ORAL
  Filled 2021-07-11: qty 2

## 2021-07-11 MED ORDER — MAGNESIUM SULFATE 2 GM/50ML IV SOLN
2.0000 g | Freq: Once | INTRAVENOUS | Status: AC
  Administered 2021-07-11: 2 g via INTRAVENOUS
  Filled 2021-07-11: qty 50

## 2021-07-11 MED ORDER — SODIUM CHLORIDE 0.9 % IV SOLN
3.0000 g | Freq: Four times a day (QID) | INTRAVENOUS | Status: DC
  Administered 2021-07-11 – 2021-07-13 (×7): 3 g via INTRAVENOUS
  Filled 2021-07-11 (×2): qty 8
  Filled 2021-07-11: qty 3
  Filled 2021-07-11 (×10): qty 8

## 2021-07-11 NOTE — Progress Notes (Signed)
Pt yelling out, arrived to room to find pt had pulled male purewick off, soaked with  urine. Pt has also pulled IV out. Pt cleaned and bed linens/gown changed. SWOT nurse paged for IV restart.

## 2021-07-11 NOTE — Progress Notes (Signed)
Occupational Therapy Treatment Patient Details Name: Lucas [name] MRN: [id number] DOB: 1945/11/24 Today's Date: 07/11/2021   History of present illness Lucas [name] is a 75 y.o. male presents with chronic decline in his overall condition with confusion. Pt admitted for acute metabolic encephalopathy and associated acute kidney injury that appears to be due to rhabdomyolysis. PMH: Parkinson's disease, prior CVA, COPD, tobacco use, HTN, chronic lower extremity edema, hyperlipidemia   OT comments  Pt agreeable to OT treatment. Pt remained supine in bed during session other than brief attempt at rolling to R side. Pt able to reach across body and grasp rail to assist with rolling, but max A still needed. Pt demonstrates increased B UE functional use with ability to complete general UE exercises supine with head of bed elevated. Pt still demonstrates tone in L UE but is able to fully extended and flex elbow. Pt tolerated P/ROM to L UE with similar ranges as supine A/ROM. Pt will benefit from continued OT in the hospital and recommended venue below to increase strength, balance, and endurance for safe ADL's.      Recommendations for follow up therapy are one component of a multi-disciplinary discharge planning process, led by the attending physician.  Recommendations may be updated based on patient status, additional functional criteria and insurance authorization.    Follow Up Recommendations  Skilled nursing-short term rehab (<3 hours/day)    Assistance Recommended at Discharge Frequent or constant Supervision/Assistance              Precautions / Restrictions Precautions Precautions: Fall Precaution Comments: wounds Restrictions Weight Bearing Restrictions: No       Mobility Bed Mobility Overal bed mobility: Needs Assistance Bed Mobility: Rolling Rolling: Max assist         General bed mobility comments: Pt able to reach across body to pull on rail with L UE with max A from  therapist for rolling.                             Balance                                           ADL either performed or assessed with clinical judgement   ADL Overall ADL's : Needs assistance/impaired                                       General ADL Comments: Pt demonstrates improved functional use of B UE today for general UE exercises.      Cognition Arousal/Alertness: Awake/alert Behavior During Therapy: WFL for tasks assessed/performed Overall Cognitive Status: No family/caregiver present to determine baseline cognitive functioning                                            Exercises Exercises: General Upper Extremity General Exercises - Upper Extremity Shoulder Flexion: PROM;Left;10 reps;AROM;Supine Shoulder Horizontal ABduction: AROM;10 reps;Both;Supine (x10 supine protraction as well.)                Pertinent Vitals/ Pain       Pain Assessment: Faces Faces Pain Scale: Hurts little more Breathing: normal Negative Vocalization:  occasional moan/groan, low speech, negative/disapproving quality Facial Expression: facial grimacing Body Language: rigid, fists clenched, knees up, pushing/pulling away, strikes out Consolability: distracted or reassured by voice/touch PAINAD Score: 6 Pain Location: Bilateral LE with bed mobility Pain Descriptors / Indicators: Sore;Grimacing Pain Intervention(s): Limited activity within patient's tolerance;Monitored during session                                                          Frequency  Min 2X/week        Progress Toward Goals  OT Goals(current goals can now be found in the care plan section)  Progress towards OT goals: Progressing toward goals  ADL Goals Pt Will Perform Eating: with mod assist;with adaptive utensils;bed level Pt Will Perform Grooming: with max assist;with mod assist;bed level;with adaptive  equipment Pt Will Perform Upper Body Dressing: with mod assist;with max assist;bed level Pt Will Perform Lower Body Dressing: with mod assist;with max assist;bed level;sitting/lateral leans;with adaptive equipment Pt Will Transfer to Toilet: with max assist;with mod assist;squat pivot transfer;stand pivot transfer;bedside commode Pt/caregiver will Perform Home Exercise Program: Increased ROM;Both right and left upper extremity;With minimal assist  Plan Discharge plan remains appropriate                                    End of Session    OT Visit Diagnosis: Unsteadiness on feet (R26.81);Other abnormalities of gait and mobility (R26.89);Muscle weakness (generalized) (M62.81);Other symptoms and signs involving the nervous system (R29.898);Other symptoms and signs involving cognitive function   Activity Tolerance Patient tolerated treatment well   Patient Left in bed;with call bell/phone within reach   Nurse Communication Other (comment) (NT notified pt bed was wet.)        Time: 0110-0129 OT Time Calculation (min): 19 min  Charges: OT General Charges $OT Visit: 1 Visit OT Treatments $Therapeutic Exercise: 8-22 mins  Loys Shugars OT, MOT  Larey Seat 07/11/2021, 2:03 PM

## 2021-07-11 NOTE — Progress Notes (Signed)
0745: Pt yelling out, arrived to room to find pt with male purewick off, soaked with urine, has pulled O2 [place] out of nose, IV site right hand infiltrated. IV site removed. O2 [place] replaced. Pt cleaned and dry linens/gown placed.Pt A&0 x4, answering questions appropriately. Pt's son arrived to room, pt began berating his son, calling him worthless, angry that son did not stay the night with him. Asked pt to stop speaking so harshly to son, pt states, "Yea, you don't know." Son states, "This is his usual. He's back to himself." Son updated on current condition, son requests that palliative staff call his brother Ronalee Belts to discuss change in discharge plans. 0820: Son stayed to assist pt with breakfast, then left for work. 0900: SWOT nurse called to come place IV on pt as he is difficult stick. Note sent to Palliative care and hospitalist regarding family's request to change discharge plans.

## 2021-07-11 NOTE — Progress Notes (Signed)
07/11/2021 1940:  Pt continues to display socially inappropriate/disruptive behavior. Despite multiple attempts at educating on proper use of call bell by this RN and staff members, pt continues to yell and scream out. This RN was in rm 314 and could hear the pt yelling so loudly that he could be heard through the wall. The pt has pulled multiple PIV's during this RN's shift. Also was informed in report that pt pulled 2 PIV's during day shift today. Upon assessment pt's mentation is A&Ox4 and he answers all questions appropriately. Will inquire about implementation of behavioral plan if pt behavior continues.This RN will continue to educate.

## 2021-07-11 NOTE — Progress Notes (Signed)
PROGRESS NOTE    Lucas [name]  TDH:[id number] DOB: 06-Nov-1945 DOA: 06/27/2021 PCP: Alfonse Flavors, MD    Subjective: The patient was seen and examined, much more awake this morning, following commands usually lethargic.  Improved from 4 to 2 L now on room air satting 98%  Brief Narrative:   Lucas [name] is a 75 y.o. male with medical history significant of Parkinson's disease, prior CVA, COPD, tobacco use, hypertension, chronic lower extremity edema, hyperlipidemia, normally lives at home.  Family had brought him to ED due to what appears to be a chronic decline in his overall condition with confusion.  Patient was admitted for acute metabolic encephalopathy and associated acute kidney injury that appears to be due to rhabdomyolysis.  Patient continues to require aggressive IV fluids per nephrology.  Continue to monitor creatinine trend and follow for outcomes.  Nephrology and palliative care following.     Assessment & Plan:   Principal Problem:   Acute on chronic renal failure (HCC) Active Problems:   Parkinson's disease (HCC)   Generalized weakness   Essential hypertension   Hyperlipidemia   COPD (chronic obstructive pulmonary disease) (HCC)   Acute metabolic encephalopathy   Bladder mass   Pressure injury of skin   Acute respiratory failure with hypercapnia (HCC)    Acute respiratory failure/T-max 101.5,/mild leukocytosis -bacteremia - 1 out of 4 blood cultures growing gram-positive cocci --likely contaminant - [place] blood cultures -We will discontinue vancomycin -Normotensive this morning  -Multifactorial, patient is bedbound, h/o COPD, possible aspiration pneumonia -Still requiring 4-5 L of oxygen, maintaining O2 sat of 96%,... Being weaned off to room air this a.m., satting 98% -Possible aspiration pneumonia, chest x-ray inconsistent due to body habitus and patient is bedbound -Since family has not come to conclusion comfort care, continue current IV  antibiotics of Zosyn (initiated 07/09/2021)   -Last dose of Lasix 40 mg IV 07/09/2021 -DuoNeb bronchodilators as needed--RT consulted -We will not obtain ABGs as we do not anticipate intubation or change patient status-goals of care at this point DNR-DNI  Acute kidney injury/failure multifactorial exacerbated by rhabdomyolysis -Continue conservative management -Somewhat improvement BUN/creatinine,  -Creatinine as high as > 8.02  >>>> 7.1, 6.13, 4.5, 3.0  >> 2.3 , 1.71 -BUN 124, 130, 116, 88, 62, 49, 35  GFR 8, 9, 13, 21, 29, 42 (With fluid resuscitation patient goes into respiratory failure due to organ dysfunction including Lung and Heart..H/o COPD)      -Lisinopril has been discontinued, -Imaging of the abdomen did not reveal any hydronephrosis  -Multiple nephrologist has been following closely--determined that patient is not a candidate for hemodialysis.  Due to his multiple comorbidities..  Low function, including bedbound.. Prognosis deemed poor -Nephrologist has signed off 07/10/2021   -Family has not chosen hospice or  comfort care measures at this point At this rate of progressively physical and mental decline anticipating in-hospital death  Note: Records indicate the patient was under hospice care at home prior to admission due to multiple comorbidities - Bedbound, poor function at baseline   -Status post IV fluid resuscitation, subsequently discontinued -Monitoring I's and O's, Foley catheter was  discontinue -I's and O's not accurate    Transaminitis 2/2 rhabdomyolysis --continue to improve -Trending down CK  Bladder outlet obstruction -Status post Foley catheter -monitoring strict I's and O's in setting of renal failure -Continue for now and will require outpatient urology evaluation for void trial   Acute toxic-metabolic encephalopathy -Mentation has improved - - likely related to  hypoxia, acute respiratory failure, ongoing renal failure,  infection -superimposed by meds         -TSH, and ammonia within normal limits  -Holding sedating meds   Hypertension -Holding BP meds, blood pressure remained soft,  -Discontinue lisinopril due to hypotension elevated creatinine -Blood pressure remained soft   Parkinson's disease-along with dementia -On Sinemet --  Hyperlipidemia -On statin, anticipating to hold   Bladder mass -Appears to have been present on CT imaging from 2019 and appears unchanged -This can be followed outpatient by urology   COPD -Mild bilateral rhonchi with minimal lower lobes crackles, poor air exchange mid to lower lobes -We will continue supplemental oxygen, maintain O2 sat greater 90% -Continue as needed bronchodilators   Sever Generalized weakness -bedbound -last Physical therapy evaluation ordered 11/8 -Patient is currently bedbound, can only be moved with Harrel Lemon lift -Patient has a severe global weakness, per nursing staff able to move with a [facility]- (family has refused comfort care, hospice, or hospice home home-2 cm palliative following closely)   Pressure ulcers on the heels, buttocks area..  Please see nurses note for details Continue wound care, canary bed provided, continuing daily wound care  Goals of care -Palliative care following, extensive family meeting with both sons Last family meeting 07/09/2021 -Family not on board with comfort care, hospice yet  Willing to entertain hospice home idea for now -Palliative care and transition of care --following  -Family has agreed to DNR/DNI status     11/10 11/11 - 11/12, 11/13/ - 11/14  -POA Lucas [name] was present at bedside, discussed the above finding, plan of care options for disposition: Hospice home versus home with hospice services as before  He is to discuss the options with his other family members -not on board with comfort care measures or hospice yet   DVT prophylaxis: Heparin Code Status: DNR Family  Communication: Continue discussing with family to determine goal of care POA Lucas [name] -updated 07/08/2021  Disposition Plan:  Status is: Inpatient   Remains inpatient appropriate because: To determine goal of care, final disposition, home with hospice services, versus hospice home     Skin Assessment:   I have examined the patient's skin and I agree with the wound assessment as performed by the wound care RN as outlined below:   Pressure Injury 03/04/21 Sacrum Mid Stage 2 -  Partial thickness loss of dermis presenting as a shallow open injury with a red, pink wound bed without slough. (Active)  03/04/21 2245  Location: Sacrum  Location Orientation: Mid  Staging: Stage 2 -  Partial thickness loss of dermis presenting as a shallow open injury with a red, pink wound bed without slough.  Wound Description (Comments):   Present on Admission: Yes      Consultants:  Nephrology Palliative care   Procedures:  See below   Antimicrobials:  None  Objective: Vitals:   07/10/21 1351 07/10/21 2117 07/11/21 1000 07/11/21 1335  BP:  (!) 117/48 102/90 (!) 159/46  Pulse: 76 72 68 67  Resp: 18 18 (!) 22 18  Temp: 98.9 F (37.2 C) 99.5 F (37.5 C) 98.5 F (36.9 C) 97.8 F (36.6 C)  TempSrc: Oral  Oral Oral  SpO2: 96% 94% 95% 98%  Weight:      Height:        Intake/Output Summary (Last 24 hours) at 07/11/2021 1540 Last data filed at 07/11/2021 0900 Gross per 24 hour  Intake 1200 ml  Output 1000  ml  Net 200 ml   Filed Weights   06/27/21 1109 06/27/21 1546  Weight: 90.7 kg 81.2 kg       Physical Exam:   General:  More awake today usually very lethargic, following some commands  HEENT:  Normocephalic, PERRL, otherwise with in Normal limits   Neuro:  CNII-XII intact. , normal motor and sensation, reflexes intact   Lungs:   Clear to auscultation BL, Respirations unlabored, no wheezes / crackles  Cardio:    S1/S2, RRR, No murmure, No Rubs or Gallops   Abdomen:    Soft, non-tender, bowel sounds active all four quadrants,  no guarding or peritoneal signs.  Muscular skeletal:  Bedbound  Limited exam - in bed, able to move all 4 extremities, severe global generalized weaknesses 2+ pulses,  symmetric, ++2 pitting edema  Skin:  Dry, warm to touch, negative for any Rashes,  Wounds: Please see nursing documentation  Pressure Injury 03/04/21 Sacrum Mid Stage 2 -  Partial thickness loss of dermis presenting as a shallow open injury with a red, pink wound bed without slough. (Active)  03/04/21 2245  Location: Sacrum  Location Orientation: Mid  Staging: Stage 2 -  Partial thickness loss of dermis presenting as a shallow open injury with a red, pink wound bed without slough.  Wound Description (Comments):   Present on Admission: Yes     Pressure Injury 06/28/21 Heel Left Deep Tissue Pressure Injury - Purple or maroon localized area of discolored intact skin or blood-filled blister due to damage of underlying soft tissue from pressure and/or shear. (Active)  06/28/21 0800  Location: Heel  Location Orientation: Left  Staging: Deep Tissue Pressure Injury - Purple or maroon localized area of discolored intact skin or blood-filled blister due to damage of underlying soft tissue from pressure and/or shear.  Wound Description (Comments):   Present on Admission: Yes     Pressure Injury 06/28/21 Buttocks Left Unstageable - Full thickness tissue loss in which the base of the injury is covered by slough (yellow, tan, gray, green or brown) and/or eschar (tan, brown or black) in the wound bed. Wound had black/gray thick cove (Active)  06/28/21 0800  Location: Buttocks  Location Orientation: Left  Staging: Unstageable - Full thickness tissue loss in which the base of the injury is covered by slough (yellow, tan, gray, green or brown) and/or eschar (tan, brown or black) in the wound bed.  Wound Description (Comments): Wound had black/gray thick covering, no drainage   Present on Admission: Yes     Pressure Injury 07/02/21 Rectum Mid Stage 2 -  Partial thickness loss of dermis presenting as a shallow open injury with a red, pink wound bed without slough. skin red, open, small amount of drainage (Active)  07/02/21 0845  Location: Rectum  Location Orientation: Mid  Staging: Stage 2 -  Partial thickness loss of dermis presenting as a shallow open injury with a red, pink wound bed without slough.  Wound Description (Comments): skin red, open, small amount of drainage  Present on Admission:               Data Reviewed: I have personally reviewed following labs and imaging studies  CBC: Recent Labs  Lab 07/06/21 0549 07/07/21 0431 07/08/21 0357 07/09/21 0438 07/10/21 0345  WBC 9.5 10.4 10.6* 14.8* 11.6*  HGB 8.9* 9.2* 9.7* 10.2* 9.3*  HCT 27.3* 28.3* 29.2* 30.4* 29.1*  MCV 98.2 99.3 99.0 98.1 101.7*  PLT 218 243 264 316 290   Basic  Metabolic Panel: Recent Labs  Lab 07/05/21 0445 07/06/21 0549 07/07/21 0431 07/08/21 0357 07/09/21 0438 07/10/21 0345 07/11/21 0507  NA 139 142  142 146* 146* 142 143 136  K 4.4 4.0  4.0 3.7 3.2* 3.3* 3.2* 3.0*  CL 100 105  105 109 111 106 111 105  CO2 23 22  22 24 23 24 23 23   GLUCOSE 88 90  90 93 111* 120* 106* 112*  BUN 130* 120*  118* 116* 88* 62* 49* 35*  CREATININE 8.02* 7.26*  7.10* 6.13* 4.50* 3.00* 2.32* 1.71*  CALCIUM 7.1* 7.4*  7.5* 7.9* 8.0* 7.9* 7.7* 8.0*  MG 2.1 2.1 2.1 2.0 1.7 1.8 1.7  PHOS 7.8* 7.2*  --   --   --   --   --    GFR: Estimated Creatinine Clearance: 37.2 mL/min (A) (by C-G formula based on SCr of 1.71 mg/dL (H)). Liver Function Tests: Recent Labs  Lab 07/05/21 0445 07/06/21 0549  ALBUMIN 2.0* 2.2*   No results for input(s): LIPASE, AMYLASE in the last 168 hours. No results for input(s): AMMONIA in the last 168 hours.  Coagulation Profile: No results for input(s): INR, PROTIME in the last 168 hours. Cardiac Enzymes: Recent Labs  Lab 07/07/21 0431  07/08/21 0357 07/09/21 0438 07/10/21 0345 07/11/21 0507  CKTOTAL 3,759* 2,647* 1,848* 1,231* 822*       Radiology Studies: No results found.      Scheduled Meds:  ALPRAZolam  1 mg Oral QHS   aspirin EC  81 mg Oral Daily   carbidopa-levodopa  1 tablet Oral TID   Chlorhexidine Gluconate Cloth  6 each Topical Daily   gabapentin  100 mg Oral BID   heparin  5,000 Units Subcutaneous Q8H   nystatin   Topical BID   sodium bicarbonate  650 mg Oral BID   Continuous Infusions:  ampicillin-sulbactam (UNASYN) IV 3 g (07/11/21 1501)    LOS: 13 days    Time spent: 43 minutes    Deatra James, MD [facility]  If 7PM-7AM, please contact night-coverage [email or web address] 07/11/2021, 3:40 PM

## 2021-07-11 NOTE — Evaluation (Signed)
Clinical/Bedside Swallow Evaluation Patient Details  Name: Lucas [name] MRN: [id number] Date of Birth: 03/02/1946  Today's Date: 07/10/2021 Time: SLP Start Time (ACUTE ONLY): 1545 SLP Stop Time (ACUTE ONLY): 1610 SLP Time Calculation (min) (ACUTE ONLY): 25 min  Past Medical History:  Past Medical History:  Diagnosis Date   Acute CVA (cerebrovascular accident) ([place]) 06/06/2015   Acute ischemic stroke ([place]) 01/19/2015   Left midbrain/thalamus.   AKI (acute kidney injury) ([place]) 03/27/2020   Alcohol use    Anxiety    Cataract    Cerebral ventriculomegaly 01/20/2015   COPD (chronic obstructive pulmonary disease) (HCC)    Depression    Depression with anxiety    Hyperlipidemia    Hypertension    Neuromuscular disorder (HCC)    Parkinson's disease ([place])    Substance abuse ([place])    Tobacco abuse    Past Surgical History:  Past Surgical History:  Procedure Laterality Date   APPENDECTOMY     EYE SURGERY     SHOULDER SURGERY     HPI:  Lucas [name] is a 75 y.o. male with medical history significant of Parkinson's disease, prior CVA, COPD, tobacco use, hypertension, chronic lower extremity edema, hyperlipidemia, normally lives at home.  Family had brought him to ED due to what appears to be a chronic decline in his overall condition with confusion.  Patient was admitted for acute metabolic encephalopathy and associated acute kidney injury that appears to be due to rhabdomyolysis.  Patient continues to require aggressive IV fluids per nephrology.  Continue to monitor creatinine trend and follow for outcomes.  Nephrology and palliative care following. BSE requested.    Assessment / Plan / Recommendation  Clinical Impression  Pt seen for clinical swallow evaluation and presents with audible congestion prior to po trial (Has been eating and drinking). Pt without overt signs of coughing, however persistent congestion heard with po trials. He denies difficulty swallowing and frequently  requested "more" to drink. No appreciated difference between thin and nectar. Would need MBSS to assess if Pt/family are interested in pursuing pending goals of care. Continue diet as ordered for now and SLP will follow tomorrow. SLP Visit Diagnosis: Dysphagia, unspecified (R13.10)    Aspiration Risk  Mild aspiration risk    Diet Recommendation Dysphagia 3 (Mech soft);Thin liquid   Liquid Administration via: Cup;Straw Medication Administration: Whole meds with puree Supervision: Staff to assist with self feeding;Full supervision/cueing for compensatory strategies Compensations: Slow rate;Small sips/bites Postural Changes: Seated upright at 90 degrees;Remain upright for at least 30 minutes after po intake    Other  Recommendations Oral Care Recommendations: Oral care BID;Staff/trained caregiver to provide oral care Other Recommendations: Clarify dietary restrictions    Recommendations for follow up therapy are one component of a multi-disciplinary discharge planning process, led by the attending physician.  Recommendations may be updated based on patient status, additional functional criteria and insurance authorization.  Follow up Recommendations  (pending)      Assistance Recommended at Discharge Frequent or constant Supervision/Assistance  Functional Status Assessment Patient has had a recent decline in their functional status and/or demonstrates limited ability to make significant improvements in function in a reasonable and predictable amount of time  Frequency and Duration min 2x/week  1 week       Prognosis Prognosis for Safe Diet Advancement: Guarded Barriers to Reach Goals: Severity of deficits      Swallow Study   General Date of Onset: 06/27/21 HPI: Lucas [name] is a 75 y.o. male  with medical history significant of Parkinson's disease, prior CVA, COPD, tobacco use, hypertension, chronic lower extremity edema, hyperlipidemia, normally lives at home.  Family had brought  him to ED due to what appears to be a chronic decline in his overall condition with confusion.  Patient was admitted for acute metabolic encephalopathy and associated acute kidney injury that appears to be due to rhabdomyolysis.  Patient continues to require aggressive IV fluids per nephrology.  Continue to monitor creatinine trend and follow for outcomes.  Nephrology and palliative care following. BSE requested. Type of Study: Bedside Swallow Evaluation Diet Prior to this Study: Dysphagia 3 (soft);Thin liquids Temperature Spikes Noted: Yes Respiratory Status: Nasal cannula History of Recent Intubation: No Behavior/Cognition: Alert;Cooperative;Pleasant mood;Requires cueing Oral Cavity Assessment: Within Functional Limits Oral Care Completed by SLP: Yes Oral Cavity - Dentition: Edentulous (reportedly has upper dentures) Vision: Functional for self-feeding Self-Feeding Abilities: Needs set up;Needs assist Patient Positioning: Upright in bed Baseline Vocal Quality: Normal;Low vocal intensity Volitional Cough: Congested;Weak Volitional Swallow: Able to elicit    Oral/Motor/Sensory Function Overall Oral Motor/Sensory Function: Within functional limits   Ice Chips Ice chips: Within functional limits Presentation: Spoon   Thin Liquid Thin Liquid: Within functional limits Presentation: Cup;Straw    Nectar Thick Nectar Thick Liquid: Within functional limits Presentation: Straw   Honey Thick Honey Thick Liquid: Not tested   Puree Puree: Within functional limits Presentation: Spoon   Solid     Solid: Within functional limits     Thank you,  Genene Churn, CCC-SLP [phone]  Late entry for 07/10/21 visit Fiora Weill 07/11/2021,8:34 AM

## 2021-07-11 NOTE — TOC Progression Note (Signed)
Transition of Care [facility]) - Progression Note    Patient Details  Name: GAYLON BENTZ MRN: [id number] Date of Birth: 09/06/1945  Transition of Care [facility]) CM/SW Contact  Shade Flood, LCSW Phone Number: 07/11/2021, 1:54 PM  Clinical Narrative:     TOC following. Pt informs Palliative Care APNP that he would like to go to SNF rehab at dc. TOC started new bed search today. Pt will need new insurance authorization started by SNF once facility selected.  TOC will follow.  Expected Discharge Plan: [place] Barriers to Discharge: Continued Medical Work up  Expected Discharge Plan and Services Expected Discharge Plan: [place] In-house Referral: Clinical Social Work   Post Acute Care Choice: [place] Living arrangements for the past 2 months: Single Family Home                                       Social Determinants of Health (SDOH) Interventions    Readmission Risk Interventions Readmission Risk Prevention Plan 07/04/2021 06/29/2021 03/28/2020  Transportation Screening Complete Complete Complete  Home Care Screening Complete Complete Complete  Medication Review (RN CM) Complete - Complete  Some recent data might be hidden

## 2021-07-11 NOTE — Progress Notes (Signed)
Palliative: [name], [name], is lying in bed with nursing staff at bedside.  He greets me, making and somewhat keeping eye contact.  He appears acutely/chronically ill and frail.  He is alert and oriented, able to make his needs known.  There is no family at bedside at this time.  Detailed discussions with bedside nursing staff.  Detail conference with transition of care team.  Call to son Lucas [name].  Left somewhat detailed voicemail message related to patient condition, needs, disposition options. Call to son Lucas [name].  We spoke about [name]'s recovery of kidney function.  I share that he no longer qualifies for residential hospice based on his improvements.  I shared that [name] is agreeable to short-term rehab and that transition of care team is working for insurance authorization and bed placement.  Lucas [name] shares that he understands that his father has chronic illnesses and even if he remains stable, he will die from Parkinson's.  He is agreeable to outpatient palliative services.  Provider choice based on location for short-term rehab.  Conference with attending, bedside nursing staff, transition of care team related to patient condition, needs, goals of care, short-term rehab.  Plan: Continue to treat the treatable but no CPR or intubation.  Outpatient palliative services to follow.  23 minutes [name], NP Palliative medicine team Team phone [phone] Greater than 50% of this time was spent counseling and coordinating care related to the above assessment and plan.

## 2021-07-11 NOTE — Progress Notes (Signed)
Physical Therapy Treatment Patient Details Name: Lucas [name] MRN: [id number] DOB: [birth year] Today's Date: 07/11/2021   History of Present Illness Lucas [name] is a 75 y.o. male presents with chronic decline in his overall condition with confusion. Pt admitted for acute metabolic encephalopathy and associated acute kidney injury that appears to be due to rhabdomyolysis. PMH: Parkinson's disease, prior CVA, COPD, tobacco use, HTN, chronic lower extremity edema, hyperlipidemia    PT Comments    Patient demonstrates increased endurance/tolerance for sitting up at bedside with frequent leaning to the left and backwards, able to keep trunk in mid line with Min assist tactile cueing and tolerated sitting up at bedside for approximately 20 minutes before requesting to lie down.  Patient able to keep trunk in midline for up to 20-30 seconds supporting self using BUE without tactile assistance before falling over to the right and able to hold onto bed rail with BUE during sidelying<>sitting.  Patient required Max assist to reposition when put back to bed.  Patient will benefit from continued skilled physical therapy in hospital and recommended venue below to increase strength, balance, endurance for safe ADLs and gait.    Recommendations for follow up therapy are one component of a multi-disciplinary discharge planning process, led by the attending physician.  Recommendations may be updated based on patient status, additional functional criteria and insurance authorization.  Follow Up Recommendations  Skilled nursing-short term rehab (<3 hours/day)     Assistance Recommended at Discharge Intermittent Supervision/Assistance  Equipment Recommendations  None recommended by PT    Recommendations for Other Services       Precautions / Restrictions Precautions Precautions: Fall Precaution Comments: wounds Restrictions Weight Bearing Restrictions: No     Mobility  Bed Mobility Overal bed  mobility: Needs Assistance Bed Mobility: Rolling;Sidelying to Sit;Sit to Sidelying Rolling: Mod assist;Max assist Sidelying to sit: Max assist     Sit to sidelying: Mod assist;Max assist General bed mobility comments: demontrates fair/good return for holding onto bed rail when rolling and during sitting to sidelying with frequent repeated verbal/tactile cueing    Transfers                        Ambulation/Gait                   Stairs             Wheelchair Mobility    Modified Rankin (Stroke Patients Only)       Balance Overall balance assessment: Needs assistance Sitting-balance support: Feet supported;No upper extremity supported Sitting balance-Leahy Scale: Poor Sitting balance - Comments: frequent leaning backwards and to the right while seated at EOB Postural control: Right lateral lean                                  Cognition Arousal/Alertness: Awake/alert Behavior During Therapy: WFL for tasks assessed/performed Overall Cognitive Status: Within Functional Limits for tasks assessed                                          Exercises General Exercises - Upper Extremity Shoulder Flexion: PROM;Left;10 reps;AROM;Supine Shoulder Horizontal ABduction: AROM;10 reps;Both;Supine (x10 supine protraction as well.) General Exercises - Lower Extremity Long Arc Quad: Seated;AROM;AAROM;Strengthening;Both;10 reps Hip Flexion/Marching: Seated;AAROM;Strengthening;Both;10 reps    General Comments  Pertinent Vitals/Pain Pain Assessment: No/denies pain Faces Pain Scale: Hurts little more Breathing: normal Negative Vocalization: occasional moan/groan, low speech, negative/disapproving quality Facial Expression: facial grimacing Body Language: rigid, fists clenched, knees up, pushing/pulling away, strikes out Consolability: distracted or reassured by voice/touch PAINAD Score: 6 Pain Location: Bilateral LE with  bed mobility Pain Descriptors / Indicators: Sore;Grimacing Pain Intervention(s): Limited activity within patient's tolerance;Monitored during session    Home Living                          Prior Function            PT Goals (current goals can now be found in the care plan section) Acute Rehab PT Goals Patient Stated Goal: return home after rehab PT Goal Formulation: With patient Time For Goal Achievement: 07/18/21 Potential to Achieve Goals: Fair Progress towards PT goals: Progressing toward goals    Frequency    Min 3X/week      PT Plan Current plan remains appropriate    Co-evaluation              AM-PAC PT "6 Clicks" Mobility   Outcome Measure  Help needed turning from your back to your side while in a flat bed without using bedrails?: A Lot Help needed moving from lying on your back to sitting on the side of a flat bed without using bedrails?: A Lot Help needed moving to and from a bed to a chair (including a wheelchair)?: Total Help needed standing up from a chair using your arms (e.g., wheelchair or bedside chair)?: Total Help needed to walk in hospital room?: Total Help needed climbing 3-5 steps with a railing? : Total 6 Click Score: 8    End of Session   Activity Tolerance: Patient tolerated treatment well;Patient limited by fatigue Patient left: in bed;with call bell/phone within reach Nurse Communication: Mobility status PT Visit Diagnosis: Other abnormalities of gait and mobility (R26.89);Muscle weakness (generalized) (M62.81);Unsteadiness on feet (R26.81)     Time: 1950-9326 PT Time Calculation (min) (ACUTE ONLY): 30 min  Charges:  $Therapeutic Exercise: 8-22 mins $Therapeutic Activity: 8-22 mins                     2:59 PM, 07/11/21 Lonell Grandchild, MPT Physical Therapist with [facility] [phone] office [phone] mobile phone

## 2021-07-11 NOTE — Progress Notes (Signed)
Speech Language Pathology Treatment: Dysphagia  Patient Details Name: Lucas [name] MRN: [id number] DOB: Jun 29, 1946 Today's Date: 07/11/2021 Time: 0100-7121 SLP Time Calculation (min) (ACUTE ONLY): 11 min  Assessment / Plan / Recommendation Clinical Impression  Pt seen for ongoing dysphagia intervention following clinical swallow evaluation completed yesterday. Pt now likely going to SNF vs home with hospice. He continues to mostly request liquids. Audible wheeze remains unchanged, no overt coughing or wet vocal quality during po intake. Pt encouraged to sit fully upright for po intake (on air mattress) and continue with oral care assist from staff. Continue diet as ordered and po medication whole in puree.    HPI HPI: Lucas [name] is a 75 y.o. male with medical history significant of Parkinson's disease, prior CVA, COPD, tobacco use, hypertension, chronic lower extremity edema, hyperlipidemia, normally lives at home.  Family had brought him to ED due to what appears to be a chronic decline in his overall condition with confusion.  Patient was admitted for acute metabolic encephalopathy and associated acute kidney injury that appears to be due to rhabdomyolysis.  Patient continues to require aggressive IV fluids per nephrology.  Continue to monitor creatinine trend and follow for outcomes.  Nephrology and palliative care following. BSE requested.      SLP Plan  Continue with current plan of care      Recommendations for follow up therapy are one component of a multi-disciplinary discharge planning process, led by the attending physician.  Recommendations may be updated based on patient status, additional functional criteria and insurance authorization.    Recommendations  Diet recommendations: Dysphagia 3 (mechanical soft);Thin liquid Liquids provided via: Cup;Straw Medication Administration: Whole meds with puree Supervision: Staff to assist with self feeding;Full supervision/cueing  for compensatory strategies Compensations: Slow rate;Small sips/bites Postural Changes and/or Swallow Maneuvers: Seated upright 90 degrees;Upright 30-60 min after meal                Oral Care Recommendations: Oral care BID;Staff/trained caregiver to provide oral care Follow Up Recommendations: Skilled nursing-short term rehab (<3 hours/day) Assistance recommended at discharge: Frequent or constant Supervision/Assistance SLP Visit Diagnosis: Dysphagia, unspecified (R13.10) Plan: Continue with current plan of care       Thank you,  Genene Churn, [place]                 [place]  07/11/2021, 4:11 PM

## 2021-07-12 DIAGNOSIS — G9341 Metabolic encephalopathy: Secondary | ICD-10-CM | POA: Diagnosis not present

## 2021-07-12 DIAGNOSIS — N181 Chronic kidney disease, stage 1: Secondary | ICD-10-CM

## 2021-07-12 DIAGNOSIS — J9602 Acute respiratory failure with hypercapnia: Secondary | ICD-10-CM

## 2021-07-12 DIAGNOSIS — N170 Acute kidney failure with tubular necrosis: Secondary | ICD-10-CM

## 2021-07-12 DIAGNOSIS — N179 Acute kidney failure, unspecified: Secondary | ICD-10-CM | POA: Diagnosis not present

## 2021-07-12 LAB — MAGNESIUM: Magnesium: 2.2 mg/dL (ref 1.7–2.4)

## 2021-07-12 LAB — CK: Total CK: 703 U/L — ABNORMAL HIGH (ref 49–397)

## 2021-07-12 MED ORDER — HYDRALAZINE HCL 20 MG/ML IJ SOLN: 10.0000 mg | Freq: Four times a day (QID) | INTRAMUSCULAR | Status: DC | PRN

## 2021-07-12 NOTE — TOC Progression Note (Signed)
Transition of Care [facility]) - Progression Note    Patient Details  Name: FENRIS CAUBLE MRN: [id number] Date of Birth: 04/09/1946  [facility]) CM/SW Contact  Shade Flood, LCSW Phone Number: 07/12/2021, 2:37 PM  Clinical Narrative:     TOC following. Followed up with pt/family on SNF bed offers. They have selected [facility] in [place]. Updated Amber in admissions at [facility]. She will work on obtaining authorization from [employer]. Anticipating they will be able to admit pt Monday of next week.  TOC will follow.  Expected Discharge Plan: [place] Barriers to Discharge: Continued Medical Work up  Expected Discharge Plan and Services Expected Discharge Plan: [place] In-house Referral: Clinical Social Work   Post Acute Care Choice: [place] Living arrangements for the past 2 months: Single Family Home                                       Social Determinants of Health (SDOH) Interventions    Readmission Risk Interventions Readmission Risk Prevention Plan 07/04/2021 06/29/2021 03/28/2020  Transportation Screening Complete Complete Complete  Home Care Screening Complete Complete Complete  Medication Review (RN CM) Complete - Complete  Some recent data might be hidden

## 2021-07-12 NOTE — Progress Notes (Signed)
[facility]                                                                              Patient Demographics  Lucas [name], is a 75 y.o. male, DOB - 1946/04/19, [id number]:[id number]  Admit date - 06/27/2021   Admitting Physician No admitting provider for patient encounter.  Outpatient Primary MD for the patient is Zhou-Talbert, [name], MD  Outpatient specialists:   LOS - 14  days   Medical records reviewed and are as summarized below:    Chief Complaint  Patient presents with   Altered Mental Status       Brief summary   Patient is a 75 year old, COPD, tobacco use, hypertension, chronic lower extremity edema, hyperlipidemia, lives at home.  Family brought him to ED due to what appeared to be a chronic decline in his overall condition with confusion.  Patient was admitted for acute metabolic encephalopathy, associated acute kidney injury, rhabdomyolysis.  He was placed on aggressive IV fluids per nephrology.  Palliative medicine was also consulted.   Assessment & Plan    Principal Problem: Acute metabolic encephalopathy, acute respiratory failure with hypoxia, ?bacteremia -1/4 blood cultures with gram-positive cocci, likely contaminant, pending sensitivities.  Vancomycin was discontinued -At baseline bedbound, history of COPD, appears to have aspiration pneumonia, congested -Patient was placed on IV antibiotics, also received Lasix 40 mg IV on 11/13 -Continue DuoNebs, overall poor prognosis, declining.  Family requesting short-term rehab however given his prognosis, physical and mental decline, likely will not tolerate any rehab. -TSH, ammonia within normal limits, hold sedating meds   Active Problems:  Acute kidney injury, multifactorial exacerbated by rhabdomyolysis -Creatinine as high as 8.4 on 07/03/2021, now improved to 1.7 -Nephrology was consulted, not a candidate for hemodialysis, overall poor prognosis, nephrology signed off on 11/14 -Patient was  placed on IV fluid hydration, creatinine improved, hence discontinued  Transaminitis secondary to rhabdomyolysis -Continues to improve    Parkinson's disease ([place]), dementia -Continue Sinemet  Hypertension -BP somewhat elevated, added IV hydralazine as needed with parameters  COPD -Continue as needed bronchodilators, O2 to maintain sats greater than 90%  Bladder mass -Appears to have been present on CT imaging from 2019 and unchanged, outpatient follow-up with urology  CVA generalized debility, bedbound severe -Overall global weakness, per nursing staff able to move with [facility] lift.  Family pursuing SNF/STR      Pressure Injury Documentation: Pressure Injury 06/28/21 Heel Left Deep Tissue Pressure Injury - Purple or maroon localized area of discolored intact skin or blood-filled blister due to damage of underlying soft tissue from pressure and/or shear. (Active)  06/28/21 0800  Location: Heel  Location Orientation: Left  Staging: Deep Tissue Pressure Injury - Purple or maroon localized area of discolored intact skin or blood-filled blister due to damage of underlying soft tissue from pressure and/or shear.  Wound Description (Comments):   Present on Admission: Yes     Pressure Injury 06/28/21 Buttocks Left Unstageable - Full thickness tissue loss in which the base of the injury is covered by slough (yellow, tan, gray, green or brown) and/or eschar (tan, brown or black) in the wound bed. Wound  had black/gray thick cove (Active)  06/28/21 0800  Location: Buttocks  Location Orientation: Left  Staging: Unstageable - Full thickness tissue loss in which the base of the injury is covered by slough (yellow, tan, gray, green or brown) and/or eschar (tan, brown or black) in the wound bed.  Wound Description (Comments): Wound had black/gray thick covering, no drainage  Present on Admission: Yes     Pressure Injury 07/02/21 Rectum Mid Stage 2 -  Partial thickness loss of dermis presenting  as a shallow open injury with a red, pink wound bed without slough. skin red, open, small amount of drainage (Active)  07/02/21 0845  Location: Rectum  Location Orientation: Mid  Staging: Stage 2 -  Partial thickness loss of dermis presenting as a shallow open injury with a red, pink wound bed without slough.  Wound Description (Comments): skin red, open, small amount of drainage  Present on Admission:     Code Status: DNR/DNI DVT Prophylaxis:  heparin injection 5,000 Units Start: 06/27/21 2200   Level of Care: Level of care: Telemetry Family Communication: No family member at the bedside   Disposition Plan:     Status is: Inpatient  Remains inpatient appropriate because: Very debilitated, acute metabolic encephalopathy overall poor prognosis, family requesting SNF/SAR.  Not on board with comfort measures or hospice.  Time Spent in minutes   35 minutes  Procedures:  None  Consultants:   Palliative medicine Nephrology  Antimicrobials:   Anti-infectives (From admission, onward)    Start     Dose/Rate Route Frequency Ordered Stop   07/11/21 1600  vancomycin (VANCOREADY) IVPB 1000 mg/200 mL  Status:  Discontinued        1,000 mg 200 mL/hr over 60 Minutes Intravenous Every 24 hours 07/10/21 1545 07/11/21 1029   07/11/21 1400  Ampicillin-Sulbactam (UNASYN) 3 g in sodium chloride 0.9 % 100 mL IVPB        3 g 200 mL/hr over 30 Minutes Intravenous Every 6 hours 07/11/21 1029     07/10/21 1630  vancomycin (VANCOREADY) IVPB 1750 mg/350 mL        1,750 mg 175 mL/hr over 120 Minutes Intravenous  Once 07/10/21 1544 07/10/21 2035   07/09/21 1230  piperacillin-tazobactam (ZOSYN) IVPB 3.375 g  Status:  Discontinued        3.375 g 12.5 mL/hr over 240 Minutes Intravenous Every 8 hours 07/09/21 1142 07/11/21 1029   06/27/21 1415  ciprofloxacin (CIPRO) IVPB 400 mg        400 mg 200 mL/hr over 60 Minutes Intravenous  Once 06/27/21 1411 06/27/21 1531   06/27/21 1415  metroNIDAZOLE (FLAGYL)  IVPB 500 mg        500 mg 100 mL/hr over 60 Minutes Intravenous  Once 06/27/21 1411 06/27/21 1531          Medications  Scheduled Meds:  ALPRAZolam  1 mg Oral QHS   aspirin EC  81 mg Oral Daily   carbidopa-levodopa  1 tablet Oral TID   Chlorhexidine Gluconate Cloth  6 each Topical Daily   gabapentin  100 mg Oral BID   heparin  5,000 Units Subcutaneous Q8H   nystatin   Topical BID   sodium bicarbonate  650 mg Oral BID   Continuous Infusions:  ampicillin-sulbactam (UNASYN) IV 3 g (07/12/21 0853)   PRN Meds:.acetaminophen **OR** acetaminophen, albuterol, alum & mag hydroxide-simeth, HYDROmorphone (DILAUDID) injection, LORazepam, ondansetron **OR** ondansetron (ZOFRAN) IV, oxyCODONE      Subjective:   Caprice Wasko was seen and  examined today.  Lethargic, appears to be congested aspirating.  Afebrile.  No acute issues overnight.  unable to obtain review of system from the patient.  Objective:   Vitals:   07/11/21 1335 07/11/21 2132 07/12/21 0616 07/12/21 0853  BP: (!) 159/46 (!) 167/62 (!) 151/37 (!) 166/81  Pulse: 67 68 64 68  Resp: 18 20 19    Temp: 97.8 F (36.6 C) (!) 97.5 F (36.4 C) 99 F (37.2 C)   TempSrc: Oral Oral Oral   SpO2: 98% 98% 95% 98%  Weight:      Height:        Intake/Output Summary (Last 24 hours) at 07/12/2021 1343 Last data filed at 07/12/2021 0500 Gross per 24 hour  Intake 232.02 ml  Output 1 ml  Net 231.02 ml     Wt Readings from Last 3 Encounters:  06/27/21 81.2 kg  06/08/21 90.7 kg  03/04/21 90.7 kg     Exam General: lethargic, congested Cardiovascular: S1 S2 auscultated, , RRR Respiratory: Coarse breath sounds bilaterally Gastrointestinal: Soft, nontender, nondistended, + bowel sounds Ext: no pedal edema bilaterally Neuro: does not follow commands Psych: lethargic   Data Reviewed:  I have personally reviewed following labs and imaging studies  Micro Results Recent Results (from the past 240 hour(s))  Culture,  blood (routine x 2)     Status: None (Preliminary result)   Collection Time: 07/09/21 11:19 AM   Specimen: BLOOD LEFT HAND  Result Value Ref Range Status   Specimen Description   Final    BLOOD LEFT HAND BOTTLES DRAWN AEROBIC AND ANAEROBIC Performed at [facility], [address] [place], [place] [address]    Special Requests   Final    Blood Culture adequate volume Performed at [facility], [address] [place] [address] [place], [place] [address]    Culture  Setup Time   Final    GRAM POSITIVE COCCI AEROBIC BOTTLE ONLY Gram Stain Report Called to,Read Back By and Verified WithSusann Givens @ 8110 07/10/21 by STEPTR CRITICAL RESULT CALLED TO, READ BACK BY AND VERIFIED WITH: PIA Montezuma RN 07/11/2021 @2259  BY JW    Culture   Final    GRAM POSITIVE COCCI IDENTIFICATION TO FOLLOW Performed at [facility], [address] [place], [place] [address]    Report Status PENDING  Incomplete  Blood Culture ID Panel (Reflexed)     Status: Abnormal   Collection Time: 07/09/21 11:19 AM  Result Value Ref Range Status   Enterococcus faecalis NOT DETECTED NOT DETECTED Final   Enterococcus Faecium NOT DETECTED NOT DETECTED Final   Listeria monocytogenes NOT DETECTED NOT DETECTED Final   Staphylococcus species DETECTED (A) NOT DETECTED Final    Comment: CRITICAL RESULT CALLED TO, READ BACK BY AND VERIFIED WITH: PIA PARRISH RN 07/11/2021 @2259  BY JW    Staphylococcus aureus (BCID) NOT DETECTED NOT DETECTED Final   Staphylococcus epidermidis NOT DETECTED NOT DETECTED Final   Staphylococcus lugdunensis NOT DETECTED NOT DETECTED Final   Streptococcus species NOT DETECTED NOT DETECTED Final   Streptococcus agalactiae NOT DETECTED NOT DETECTED Final   Streptococcus pneumoniae NOT DETECTED NOT DETECTED Final   Streptococcus pyogenes NOT DETECTED NOT DETECTED Final   A.calcoaceticus-baumannii NOT DETECTED NOT DETECTED Final   Bacteroides fragilis NOT DETECTED NOT DETECTED Final   Enterobacterales NOT DETECTED  NOT DETECTED Final   Enterobacter cloacae complex NOT DETECTED NOT DETECTED Final   Escherichia coli NOT DETECTED NOT DETECTED Final   Klebsiella aerogenes NOT DETECTED NOT DETECTED  Final   Klebsiella oxytoca NOT DETECTED NOT DETECTED Final   Klebsiella pneumoniae NOT DETECTED NOT DETECTED Final   Proteus species NOT DETECTED NOT DETECTED Final   Salmonella species NOT DETECTED NOT DETECTED Final   Serratia marcescens NOT DETECTED NOT DETECTED Final   Haemophilus influenzae NOT DETECTED NOT DETECTED Final   Neisseria meningitidis NOT DETECTED NOT DETECTED Final   Pseudomonas aeruginosa NOT DETECTED NOT DETECTED Final   Stenotrophomonas maltophilia NOT DETECTED NOT DETECTED Final   Candida albicans NOT DETECTED NOT DETECTED Final   Candida auris NOT DETECTED NOT DETECTED Final   Candida glabrata NOT DETECTED NOT DETECTED Final   Candida krusei NOT DETECTED NOT DETECTED Final   Candida parapsilosis NOT DETECTED NOT DETECTED Final   Candida tropicalis NOT DETECTED NOT DETECTED Final   Cryptococcus neoformans/gattii NOT DETECTED NOT DETECTED Final    Comment: Performed at [facility], [place] [address] [place], [place] [address]  Culture, blood (routine x 2)     Status: None (Preliminary result)   Collection Time: 07/09/21 11:31 AM   Specimen: Right Antecubital; Blood  Result Value Ref Range Status   Specimen Description   Final    RIGHT ANTECUBITAL BOTTLES DRAWN AEROBIC AND ANAEROBIC   Special Requests Blood Culture adequate volume  Final   Culture   Final    NO GROWTH 2 DAYS Performed at [facility], [address] [place], [place] [address]    Report Status PENDING  Incomplete    Radiology Reports CT ABDOMEN PELVIS WO CONTRAST  Result Date: 06/27/2021 CLINICAL DATA:  Abdominal pain. Strong urine smell. Elevated creatinine. EXAM: CT ABDOMEN AND PELVIS WITHOUT CONTRAST TECHNIQUE: Multidetector CT imaging of the abdomen and pelvis was performed following the standard protocol  without IV contrast. COMPARISON:  11/03/2017. FINDINGS: Lower chest: No acute abnormality. Hepatobiliary: No focal liver abnormality is seen. No gallstones, gallbladder wall thickening, or biliary dilatation. Pancreas: Unremarkable. No pancreatic ductal dilatation or surrounding inflammatory changes. Spleen: Normal in size without focal abnormality. Adrenals/Urinary Tract: No adrenal masses. No right kidney. Mild left renal cortical thinning. No renal stone or hydronephrosis. Normal left ureter. Dilated tubular structure extends from just below the right adrenal gland to the right posterior bladder at the level of the ureteral trigone. This dilated ureter is contiguous with a bladder filling defect, also of the same attenuation. This appearance is concerning for a bladder malignancy, but is unchanged from the 11/03/2017 exam strongly favoring a benign etiology. Bladder is minimally distended, otherwise unremarkable. Stomach/Bowel: Normal stomach. Small bowel and colon are normal in caliber. No wall thickening. Subtle hazy opacity lies adjacent to the proximal to mid sigmoid colon where there are also multiple diverticula. This is not evident on the prior CT. No other evidence of pericolonic inflammation. Normal appendix is visualized. Vascular/Lymphatic: Aortic atherosclerosis. No aneurysm. No enlarged lymph nodes. Reproductive: Prostate is normal in size. Other: Small fat containing left inguinal hernia.  No ascites. Musculoskeletal: No fracture or acute finding.  No bone lesion. IMPRESSION: 1. Subtle hazy opacity lies adjacent to the proximal to mid sigmoid colon, where there are multiple diverticula. This is an equivocal finding, but mild uncomplicated diverticulitis should be consider if this correlates clinically. No other evidence of an acute abnormality within the abdomen or pelvis. 2. Dilated tubular structure that is contiguous with a mass projecting within the bladder. There is no visualized right kidney,  presumably congenitally absent. The dilated tubular structure is likely the right gonadal vein. However, the course parallels  the expected course of a right ureter. Both the bladder mass and the dilated tubular structure are stable compared to the 11/03/2017 CT. 3. Aortic atherosclerosis. Electronically Signed   By: Lajean Manes M.D.   On: 06/27/2021 13:49   CT Head Wo Contrast  Result Date: 06/27/2021 CLINICAL DATA:  Altered mental status. EXAM: CT HEAD WITHOUT CONTRAST TECHNIQUE: Contiguous axial images were obtained from the base of the skull through the vertex without intravenous contrast. COMPARISON:  December 21, 2019. FINDINGS: Brain: Mild chronic ischemic white matter disease is noted. Stable mild ventricular prominence is noted which may be due to mild cortical atrophy. No mass effect or midline shift is noted. There is no evidence of mass lesion, hemorrhage or acute infarction. Vascular: No hyperdense vessel or unexpected calcification. Skull: Normal. Negative for fracture or focal lesion. Sinuses/Orbits: No acute finding. Other: None. IMPRESSION: No acute intracranial abnormality seen. Stable mild ventricular dilatation as described above. Electronically Signed   By: Marijo Conception M.D.   On: 06/27/2021 13:26   US SCROTUM  Result Date: 07/05/2021 CLINICAL DATA:  Edema EXAM: ULTRASOUND OF SCROTUM TECHNIQUE: Complete ultrasound examination of the testicles, epididymis, and other scrotal structures was performed. COMPARISON:  None. FINDINGS: Right testicle Measurements: 4.5 x 2.9 x 2.2 cm. No mass or microlithiasis visualized. Left testicle Measurements: 4 x 2.7 x 3 cm. No mass or microlithiasis visualized. Right epididymis:  Normal size.  7 mm epididymal head cyst. Left epididymis:  Normal in size and appearance. Hydrocele: Moderate right hydrocele. Moderate to large left hydrocele. Varicocele:  None visualized. Diffuse scrotal skin thickening and subcutaneous edema with no focal collection  identified. IMPRESSION: 1. No testicular mass identified. 2. Bilateral moderate to large hydroceles. 3. Diffuse scrotal skin thickening and subcutaneous edema with no focal collection identified. 4. Right epididymal head cyst. Electronically Signed   By: Ofilia Neas M.D.   On: 07/05/2021 11:56   DG Chest Port 1 View  Result Date: 07/09/2021 CLINICAL DATA:  75 year old male with shortness of breath. EXAM: PORTABLE CHEST - 1 VIEW COMPARISON:  07/07/2021 FINDINGS: The mediastinal contours are within normal limits. Unchanged mild cardiomegaly. Low lung volumes. Bibasilar subsegmental atelectasis, slightly more conspicuous than comparison. No pleural effusion or pneumothorax. No acute osseous abnormality. IMPRESSION: Low lung volumes bibasilar subsegmental atelectasis. Electronically Signed   By: Ruthann Cancer M.D.   On: 07/09/2021 10:10   DG CHEST PORT 1 VIEW  Result Date: 07/07/2021 CLINICAL DATA:  Fluid overload EXAM: PORTABLE CHEST 1 VIEW COMPARISON:  07/03/2021, 06/27/2021, 12/21/2019 FINDINGS: Low lung volumes. Borderline cardiomegaly. Mild bronchovascular crowding due to low lung volume. No pleural effusion or pneumothorax. IMPRESSION: Hypoventilatory changes with borderline cardiomegaly Electronically Signed   By: Donavan Foil M.D.   On: 07/07/2021 23:09   DG CHEST PORT 1 VIEW  Result Date: 07/03/2021 CLINICAL DATA:  Shortness of breath EXAM: PORTABLE CHEST 1 VIEW COMPARISON:  Chest x-ray 06/27/2021 FINDINGS: Heart size is upper normal. Mediastinum appears stable. No focal consolidation identified. No significant pleural effusion. No pneumothorax. IMPRESSION: No acute intrathoracic process identified. Electronically Signed   By: Ofilia Neas M.D.   On: 07/03/2021 12:43   DG Chest Port 1 View  Result Date: 06/27/2021 CLINICAL DATA:  Altered mental status in a 75 year old male. EXAM: PORTABLE CHEST 1 VIEW COMPARISON:  June 09, 2021. FINDINGS: EKG leads project over the chest.  Trachea midline. Cardiomediastinal contours and hilar structures stable. Linear opacity present over the LEFT hemidiaphragm. No visible pneumothorax. No sign of pleural  effusion. On limited assessment no acute skeletal process. IMPRESSION: Linear opacity over the LEFT hemidiaphragm may represent atelectasis or scarring. No acute cardiopulmonary process. Electronically Signed   By: Zetta Bills M.D.   On: 06/27/2021 12:10    Lab Data:  CBC: Recent Labs  Lab 07/06/21 0549 07/07/21 0431 07/08/21 0357 07/09/21 0438 07/10/21 0345  WBC 9.5 10.4 10.6* 14.8* 11.6*  HGB 8.9* 9.2* 9.7* 10.2* 9.3*  HCT 27.3* 28.3* 29.2* 30.4* 29.1*  MCV 98.2 99.3 99.0 98.1 101.7*  PLT 218 243 264 316 829   Basic Metabolic Panel: Recent Labs  Lab 07/06/21 0549 07/07/21 0431 07/08/21 0357 07/09/21 0438 07/10/21 0345 07/11/21 0507 07/12/21 0505  NA 142  142 146* 146* 142 143 136  --   K 4.0  4.0 3.7 3.2* 3.3* 3.2* 3.0*  --   CL 105  105 109 111 106 111 105  --   CO2 22  22 24 23 24 23 23   --   GLUCOSE 90  90 93 111* 120* 106* 112*  --   BUN 120*  118* 116* 88* 62* 49* 35*  --   CREATININE 7.26*  7.10* 6.13* 4.50* 3.00* 2.32* 1.71*  --   CALCIUM 7.4*  7.5* 7.9* 8.0* 7.9* 7.7* 8.0*  --   MG 2.1 2.1 2.0 1.7 1.8 1.7 2.2  PHOS 7.2*  --   --   --   --   --   --    GFR: Estimated Creatinine Clearance: 37.2 mL/min (A) (by C-G formula based on SCr of 1.71 mg/dL (H)). Liver Function Tests: Recent Labs  Lab 07/06/21 0549  ALBUMIN 2.2*   No results for input(s): LIPASE, AMYLASE in the last 168 hours. No results for input(s): AMMONIA in the last 168 hours. Coagulation Profile: No results for input(s): INR, PROTIME in the last 168 hours. Cardiac Enzymes: Recent Labs  Lab 07/08/21 0357 07/09/21 0438 07/10/21 0345 07/11/21 0507 07/12/21 0505  CKTOTAL 2,647* 1,848* 1,231* 822* 703*   BNP (last 3 results) No results for input(s): PROBNP in the last 8760 hours. HbA1C: No results for input(s):  HGBA1C in the last 72 hours. CBG: No results for input(s): GLUCAP in the last 168 hours. Lipid Profile: No results for input(s): CHOL, HDL, LDLCALC, TRIG, CHOLHDL, LDLDIRECT in the last 72 hours. Thyroid Function Tests: No results for input(s): TSH, T4TOTAL, FREET4, T3FREE, THYROIDAB in the last 72 hours. Anemia Panel: No results for input(s): VITAMINB12, FOLATE, FERRITIN, TIBC, IRON, RETICCTPCT in the last 72 hours. Urine analysis:    Component Value Date/Time   COLORURINE STRAW (A) 07/09/2021 1118   APPEARANCEUR CLEAR 07/09/2021 1118   LABSPEC 1.008 07/09/2021 1118   PHURINE 8.0 07/09/2021 1118   GLUCOSEU NEGATIVE 07/09/2021 1118   HGBUR MODERATE (A) 07/09/2021 1118   BILIRUBINUR NEGATIVE 07/09/2021 1118   [place] 07/09/2021 1118   PROTEINUR NEGATIVE 07/09/2021 1118   UROBILINOGEN 0.2 06/05/2015 1821   NITRITE NEGATIVE 07/09/2021 1118   LEUKOCYTESUR NEGATIVE 07/09/2021 1118     Shemika Robbs M.D. [facility] 07/12/2021, 1:43 PM  Available via Epic secure chat 7am-7pm After 7 pm, please refer to night coverage provider listed on amion.

## 2021-07-13 ENCOUNTER — Inpatient Hospital Stay (HOSPITAL_COMMUNITY): Payer: Medicare Other

## 2021-07-13 DIAGNOSIS — G9341 Metabolic encephalopathy: Secondary | ICD-10-CM

## 2021-07-13 DIAGNOSIS — N179 Acute kidney failure, unspecified: Secondary | ICD-10-CM | POA: Diagnosis not present

## 2021-07-13 DIAGNOSIS — N170 Acute kidney failure with tubular necrosis: Secondary | ICD-10-CM | POA: Diagnosis not present

## 2021-07-13 DIAGNOSIS — Z515 Encounter for palliative care: Secondary | ICD-10-CM | POA: Diagnosis not present

## 2021-07-13 DIAGNOSIS — J9602 Acute respiratory failure with hypercapnia: Secondary | ICD-10-CM

## 2021-07-13 DIAGNOSIS — Z7189 Other specified counseling: Secondary | ICD-10-CM | POA: Diagnosis not present

## 2021-07-13 LAB — CBC
HCT: 30.8 % — ABNORMAL LOW (ref 39.0–52.0)
Hemoglobin: 9.7 g/dL — ABNORMAL LOW (ref 13.0–17.0)
MCH: 31.4 pg (ref 26.0–34.0)
MCHC: 31.5 g/dL (ref 30.0–36.0)
MCV: 99.7 fL (ref 80.0–100.0)
Platelets: 408 10*3/uL — ABNORMAL HIGH (ref 150–400)
RBC: 3.09 MIL/uL — ABNORMAL LOW (ref 4.22–5.81)
RDW: 13.2 % (ref 11.5–15.5)
WBC: 10.5 10*3/uL (ref 4.0–10.5)
nRBC: 0 % (ref 0.0–0.2)

## 2021-07-13 LAB — BASIC METABOLIC PANEL
Anion gap: 12 (ref 5–15)
BUN: 27 mg/dL — ABNORMAL HIGH (ref 8–23)
CO2: 20 mmol/L — ABNORMAL LOW (ref 22–32)
Calcium: 8.5 mg/dL — ABNORMAL LOW (ref 8.9–10.3)
Chloride: 112 mmol/L — ABNORMAL HIGH (ref 98–111)
Creatinine, Ser: 1.27 mg/dL — ABNORMAL HIGH (ref 0.61–1.24)
GFR, Estimated: 60 mL/min — ABNORMAL LOW (ref >60)
Glucose, Bld: 77 mg/dL (ref 70–99)
Potassium: 2.8 mmol/L — ABNORMAL LOW (ref 3.5–5.1)
Sodium: 144 mmol/L (ref 135–145)

## 2021-07-13 MED ORDER — PREDNISONE 20 MG PO TABS
40.0000 mg | ORAL_TABLET | Freq: Every day | ORAL | Status: AC
  Administered 2021-07-14 – 2021-07-16 (×3): 40 mg via ORAL
  Filled 2021-07-13 (×3): qty 2

## 2021-07-13 MED ORDER — AMLODIPINE BESYLATE 5 MG PO TABS
5.0000 mg | ORAL_TABLET | Freq: Every day | ORAL | Status: DC
  Administered 2021-07-13 – 2021-07-16 (×4): 5 mg via ORAL
  Filled 2021-07-13 (×4): qty 1

## 2021-07-13 MED ORDER — QUETIAPINE FUMARATE 25 MG PO TABS
25.0000 mg | ORAL_TABLET | Freq: Every day | ORAL | Status: DC
  Administered 2021-07-13 – 2021-07-14 (×2): 25 mg via ORAL
  Filled 2021-07-13 (×2): qty 1

## 2021-07-13 MED ORDER — METHYLPREDNISOLONE SODIUM SUCC 125 MG IJ SOLR: 125.0000 mg | Freq: Once | INTRAMUSCULAR | Status: DC

## 2021-07-13 MED ORDER — IPRATROPIUM-ALBUTEROL 0.5-2.5 (3) MG/3ML IN SOLN
3.0000 mL | Freq: Two times a day (BID) | RESPIRATORY_TRACT | Status: DC
  Administered 2021-07-14 – 2021-07-16 (×4): 3 mL via RESPIRATORY_TRACT
  Filled 2021-07-13 (×4): qty 3

## 2021-07-13 MED ORDER — PREDNISONE 20 MG PO TABS
40.0000 mg | ORAL_TABLET | Freq: Every day | ORAL | Status: DC
  Administered 2021-07-13: 12:00:00 40 mg via ORAL
  Filled 2021-07-13: qty 2

## 2021-07-13 MED ORDER — POTASSIUM CHLORIDE CRYS ER 20 MEQ PO TBCR
40.0000 meq | EXTENDED_RELEASE_TABLET | ORAL | Status: AC
  Administered 2021-07-13 (×2): 40 meq via ORAL
  Filled 2021-07-13 (×2): qty 2

## 2021-07-13 MED ORDER — HALOPERIDOL 0.5 MG PO TABS
1.0000 mg | ORAL_TABLET | Freq: Three times a day (TID) | ORAL | Status: DC | PRN
  Administered 2021-07-16 – 2021-07-20 (×3): 1 mg via ORAL
  Filled 2021-07-13 (×3): qty 2

## 2021-07-13 MED ORDER — ALPRAZOLAM 0.5 MG PO TABS
0.5000 mg | ORAL_TABLET | Freq: Every day | ORAL | Status: DC
  Administered 2021-07-13 – 2021-07-14 (×2): 0.5 mg via ORAL
  Filled 2021-07-13 (×2): qty 1

## 2021-07-13 MED ORDER — AMOXICILLIN-POT CLAVULANATE 875-125 MG PO TABS
1.0000 | ORAL_TABLET | Freq: Two times a day (BID) | ORAL | Status: DC
  Administered 2021-07-13 – 2021-07-19 (×13): 1 via ORAL
  Filled 2021-07-13 (×13): qty 1

## 2021-07-13 MED ORDER — IPRATROPIUM-ALBUTEROL 0.5-2.5 (3) MG/3ML IN SOLN
3.0000 mL | Freq: Four times a day (QID) | RESPIRATORY_TRACT | Status: DC
  Administered 2021-07-13 (×2): 3 mL via RESPIRATORY_TRACT
  Filled 2021-07-13 (×2): qty 3

## 2021-07-13 NOTE — Progress Notes (Signed)
Palliative: Lucas [name], Lucas [name], is lying quietly in bed.  He greets me, making and mostly keeping eye contact.  He appears chronically ill and quite frail.  He is alert and oriented, able to make his needs known.  There is no family at bedside at this time.  Lucas [name] continues to be agreeable to short-term rehab.  Transition of care team is working for bed placement and insurance authorization.  No specific needs at this time.  Conference with attending, bedside nursing staff, transition of care team related to patient condition, needs, goals of care, disposition. It seems that Lucas [name] would not be able to discharge until 11/21 or 22 at the earliest.  Plan: Continue to treat the treatable but no CPR or intubation.  Agreeable to short-term rehab.  Outpatient palliative services to follow, provider choice dictated by location of short-term rehab.  25 minutes [name], NP Palliative medicine team Team phone [phone] Greater than 50% of this time was spent counseling and coordinating care related to the above assessment and plan.

## 2021-07-13 NOTE — Progress Notes (Signed)
Occupational Therapy Treatment Patient Details Name: Lucas [name] MRN: [id number] DOB: 09-12-1945 Today's Date: 07/13/2021   History of present illness Lucas [name] is a 75 y.o. male presents with chronic decline in his overall condition with confusion. Pt admitted for acute metabolic encephalopathy and associated acute kidney injury that appears to be due to rhabdomyolysis. PMH: Parkinson's disease, prior CVA, COPD, tobacco use, HTN, chronic lower extremity edema, hyperlipidemia   OT comments  Pt agreeable to OT/PT co-treatment. Pt required max A for supine to sit and sit to supine bed mobility with poor balance and frequent posterior lean. Pt completed A/ROM exercises supine in bed with grading to A/AROM for shoulder abduction due to pt limitations around 90* with A/ROM. Pt left in bed with call bell within reach. Pt will benefit from continued OT in the hospital and recommended venue below to increase strength, balance, and endurance for safe ADL's.       Recommendations for follow up therapy are one component of a multi-disciplinary discharge planning process, led by the attending physician.  Recommendations may be updated based on patient status, additional functional criteria and insurance authorization.    Follow Up Recommendations  Skilled nursing-short term rehab (<3 hours/day)    Assistance Recommended at Discharge Frequent or constant Supervision/Assistance  Equipment Recommendations  None recommended by OT    Recommendations for Other Services      Precautions / Restrictions Precautions Precautions: Fall Precaution Comments: wounds Restrictions Weight Bearing Restrictions: No       Mobility Bed Mobility Overal bed mobility: Needs Assistance Bed Mobility: Supine to Sit;Sit to Supine     Supine to sit: Max assist Sit to supine: Max assist   General bed mobility comments: increased time, labored movement, c/o increased pain with pressure to/movement of BLE     Transfers                         Balance Overall balance assessment: Needs assistance Sitting-balance support: Feet supported;No upper extremity supported Sitting balance-Leahy Scale: Poor Sitting balance - Comments: frequent leaning backwards once seated and falling forward when scooting to EOB Postural control: Posterior lean                                 ADL either performed or assessed with clinical judgement   ADL                       Lower Body Dressing: Total assistance;Bed level Lower Body Dressing Details (indicate cue type and reason): total assist to don socks                    Extremity/Trunk Assessment              Vision       Perception     Praxis      Cognition Arousal/Alertness: Awake/alert Behavior During Therapy: WFL for tasks assessed/performed Overall Cognitive Status: Within Functional Limits for tasks assessed                                            Exercises General Exercises - Upper Extremity Shoulder Flexion: AROM;10 reps;Supine;Both Shoulder ABduction: AAROM;10 reps;Supine;Both Shoulder Horizontal ABduction: AROM;10 reps;Supine;Both (x10 protraction A/ROM supine) General Exercises - Lower Extremity  Long Arc Quad: Seated;AROM;AAROM;Strengthening;Both;10 reps Hip Flexion/Marching: Seated;AAROM;Strengthening;Both;10 reps Other Exercises Other Exercises: PROM and stretching to bilateral ankles 2 x 1 minute holds   Shoulder Instructions       General Comments      Pertinent Vitals/ Pain       Pain Assessment: Faces Faces Pain Scale: Hurts little more Pain Location: Bilateral LE with bed mobility Pain Descriptors / Indicators: Sore;Grimacing Pain Intervention(s): Limited activity within patient's tolerance;Monitored during session;Repositioned                                                          Frequency  Min 2X/week         Progress Toward Goals  OT Goals(current goals can now be found in the care plan section)  Progress towards OT goals: Progressing toward goals  ADL Goals Pt Will Perform Eating: with mod assist;with adaptive utensils;bed level Pt Will Perform Grooming: with max assist;with mod assist;bed level;with adaptive equipment Pt Will Perform Upper Body Dressing: with mod assist;with max assist;bed level Pt Will Perform Lower Body Dressing: with mod assist;with max assist;bed level;sitting/lateral leans;with adaptive equipment Pt Will Transfer to Toilet: with max assist;with mod assist;squat pivot transfer;stand pivot transfer;bedside commode Pt/caregiver will Perform Home Exercise Program: Increased ROM;Both right and left upper extremity;With minimal assist  Plan Discharge plan remains appropriate    Co-evaluation    PT/OT/SLP Co-Evaluation/Treatment: Yes Reason for Co-Treatment: Complexity of the patient's impairments (multi-system involvement) PT goals addressed during session: Mobility/safety with mobility;Balance;Strengthening/ROM OT goals addressed during session: ADL's and self-care      AM-PAC OT "6 Clicks" Daily Activity     Outcome Measure                    End of Session    OT Visit Diagnosis: Unsteadiness on feet (R26.81);Other abnormalities of gait and mobility (R26.89);Muscle weakness (generalized) (M62.81);Other symptoms and signs involving the nervous system (R29.898);Other symptoms and signs involving cognitive function   Activity Tolerance Patient tolerated treatment well   Patient Left in bed;with call bell/phone within reach   Nurse Communication          Time: 2979-8921 OT Time Calculation (min): 29 min  Charges: OT General Charges $OT Visit: 1 Visit OT Treatments $Therapeutic Exercise: 8-22 mins  Lucas [name] OT, MOT  Larey Seat 07/13/2021, 11:39 AM

## 2021-07-13 NOTE — Progress Notes (Signed)
Didn't give Solu-medrol at this time. Medication not given d/t no Iv access. Awaiting N.O's. MD switching IV meds to PO.

## 2021-07-13 NOTE — Progress Notes (Addendum)
07/13/2021 0457:  Pt has pulled PIV from [facility]. Charge RN aware and will alert AC. This RN will alert MD on call.   07/13/2021 0507: MD on call paged regarding PIV and pt's behavior throughout shift. Awaiting response and orders.   07/13/2021 0530: Orders reviewed and initiated.

## 2021-07-13 NOTE — Progress Notes (Addendum)
[facility]                                                                              Patient Demographics  Lucas [name], is a 75 y.o. male, DOB - 03/31/1946, RXV:[id number]  Admit date - 06/27/2021   Admitting Physician No admitting provider for patient encounter.  Outpatient Primary MD for the patient is Zhou-Talbert, [name], MD  Outpatient specialists:   LOS - 15  days   Medical records reviewed and are as summarized below:    Chief Complaint  Patient presents with   Altered Mental Status       Brief summary   Patient is a 75 year old, COPD, tobacco use, hypertension, chronic lower extremity edema, hyperlipidemia, lives at home.  Family brought him to ED due to what appeared to be a chronic decline in his overall condition with confusion.  Patient was admitted for acute metabolic encephalopathy, associated acute kidney injury, rhabdomyolysis.  He was placed on aggressive IV fluids per nephrology.  Palliative medicine was also consulted.   Assessment & Plan    Principal Problem: Acute Respiratory failure with hypoxia, ?bacteremia -1/4 blood cultures with gram-positive cocci, likely contaminant, pending sensitivities.  Vancomycin was discontinued -At baseline bedbound, history of COPD, appears to have aspiration pneumonia, congested -Patient was placed on IV antibiotics, also received Lasix 40 mg IV on 11/13 - Family requesting short-term rehab however given his prognosis and decline, may not tolerate rehab.  -Appears to be aspirating, discussed with the patient's son at the bedside that patient needs to be upright while eating.  SLP evaluation requested. -For now, will change to dysphagia 3 diet. -Diffusely wheezing and coughing, placed on prednisone 40 mg daily for 3 days. -Continue Augmentin   Active Problems: Acute metabolic encephalopathy, superimposed on dementia -Overnight has been agitated, yelling, has sundowning -TSH, ammonia within  normal limits, hold sedating meds -Placed on Seroquel 25 mg at 8 PM daily, wean xanax to 0.5mg  qhs -will add oral Haldol as needed for agitation -Son at the bedside, fairly alert and oriented, appears close to his baseline discussed in  Acute kidney injury, multifactorial exacerbated by rhabdomyolysis, NAGMA -Creatinine as high as 8.4 on 07/03/2021, now improved to 1.2 -Nephrology was consulted, not a candidate for hemodialysis, overall poor prognosis, nephrology signed off on 11/14 -Patient was placed on IV fluid hydration, creatinine improved, hence discontinued -Continue sodium bicarb  Hypokalemia -Potassium 2.8, replaced p.o. patient has pulled out his IV lines, refuses to wear telemetry  Transaminitis secondary to rhabdomyolysis -Continues to improve    Parkinson's disease ([place]), dementia -Continue Sinemet  Hypertension -BP somewhat elevated, continue hydralazine as needed with parameters  COPD -Diffuse wheezing bilaterally, placed on scheduled DuoNebs every 6 hours  -added prednisone 40 mg daily x3 days, continue Augmentin   Bladder mass -Appears to have been present on CT imaging from 2019 and unchanged, outpatient follow-up with urology  CVA generalized debility, bedbound severe -Overall global weakness, per nursing staff able to move with [facility] lift.  Family pursuing SNF/STR      Pressure Injury Documentation: Pressure Injury 06/28/21 Heel Left Deep Tissue Pressure Injury - Purple or  maroon localized area of discolored intact skin or blood-filled blister due to damage of underlying soft tissue from pressure and/or shear. (Active)  06/28/21 0800  Location: Heel  Location Orientation: Left  Staging: Deep Tissue Pressure Injury - Purple or maroon localized area of discolored intact skin or blood-filled blister due to damage of underlying soft tissue from pressure and/or shear.  Wound Description (Comments):   Present on Admission: Yes     Pressure Injury 06/28/21  Buttocks Left Unstageable - Full thickness tissue loss in which the base of the injury is covered by slough (yellow, tan, gray, green or brown) and/or eschar (tan, brown or black) in the wound bed. Wound had black/gray thick cove (Active)  06/28/21 0800  Location: Buttocks  Location Orientation: Left  Staging: Unstageable - Full thickness tissue loss in which the base of the injury is covered by slough (yellow, tan, gray, green or brown) and/or eschar (tan, brown or black) in the wound bed.  Wound Description (Comments): Wound had black/gray thick covering, no drainage  Present on Admission: Yes     Pressure Injury 07/02/21 Rectum Mid Stage 2 -  Partial thickness loss of dermis presenting as a shallow open injury with a red, pink wound bed without slough. skin red, open, small amount of drainage (Active)  07/02/21 0845  Location: Rectum  Location Orientation: Mid  Staging: Stage 2 -  Partial thickness loss of dermis presenting as a shallow open injury with a red, pink wound bed without slough.  Wound Description (Comments): skin red, open, small amount of drainage  Present on Admission:     Code Status: DNR/DNI DVT Prophylaxis:  heparin injection 5,000 Units Start: 06/27/21 2200   Level of Care: Level of care: Telemetry Family Communication: Discussed in detail and updated patient's son at the bedside today   Disposition Plan:     Status is: Inpatient  Remains inpatient appropriate because: Very debilitated, acute metabolic encephalopathy overall poor prognosis, family requesting SNF  Time Spent in minutes   53mins   Procedures:  None  Consultants:   Palliative medicine Nephrology  Antimicrobials:   Anti-infectives (From admission, onward)    Start     Dose/Rate Route Frequency Ordered Stop   07/13/21 1115  amoxicillin-clavulanate (AUGMENTIN) 875-125 MG per tablet 1 tablet        1 tablet Oral Every 12 hours 07/13/21 1025     07/11/21 1600  vancomycin (VANCOREADY) IVPB  1000 mg/200 mL  Status:  Discontinued        1,000 mg 200 mL/hr over 60 Minutes Intravenous Every 24 hours 07/10/21 1545 07/11/21 1029   07/11/21 1400  Ampicillin-Sulbactam (UNASYN) 3 g in sodium chloride 0.9 % 100 mL IVPB  Status:  Discontinued        3 g 200 mL/hr over 30 Minutes Intravenous Every 6 hours 07/11/21 1029 07/13/21 1025   07/10/21 1630  vancomycin (VANCOREADY) IVPB 1750 mg/350 mL        1,750 mg 175 mL/hr over 120 Minutes Intravenous  Once 07/10/21 1544 07/10/21 2035   07/09/21 1230  piperacillin-tazobactam (ZOSYN) IVPB 3.375 g  Status:  Discontinued        3.375 g 12.5 mL/hr over 240 Minutes Intravenous Every 8 hours 07/09/21 1142 07/11/21 1029   06/27/21 1415  ciprofloxacin (CIPRO) IVPB 400 mg        400 mg 200 mL/hr over 60 Minutes Intravenous  Once 06/27/21 1411 06/27/21 1531   06/27/21 1415  metroNIDAZOLE (FLAGYL) IVPB 500 mg  500 mg 100 mL/hr over 60 Minutes Intravenous  Once 06/27/21 1411 06/27/21 1531          Medications  Scheduled Meds:  ALPRAZolam  1 mg Oral QHS   amoxicillin-clavulanate  1 tablet Oral Q12H   aspirin EC  81 mg Oral Daily   carbidopa-levodopa  1 tablet Oral TID   Chlorhexidine Gluconate Cloth  6 each Topical Daily   gabapentin  100 mg Oral BID   heparin  5,000 Units Subcutaneous Q8H   ipratropium-albuterol  3 mL Nebulization Q6H   nystatin   Topical BID   predniSONE  40 mg Oral Q breakfast   QUEtiapine  25 mg Oral QHS   sodium bicarbonate  650 mg Oral BID   Continuous Infusions:   PRN Meds:.acetaminophen **OR** acetaminophen, albuterol, alum & mag hydroxide-simeth, hydrALAZINE, HYDROmorphone (DILAUDID) injection, LORazepam, ondansetron **OR** ondansetron (ZOFRAN) IV, oxyCODONE      Subjective:   Ry Moody was seen and examined today.  Alert awake and oriented today, son at the bedside feeling, patient coughing after bites.  No fevers or chills.  Overnight was agitated, sundowning.   Objective:   Vitals:    07/12/21 2141 07/13/21 0550 07/13/21 0836 07/13/21 1326  BP: (!) 188/84 (!) 183/66  (!) 180/59  Pulse: 77 80 79 72  Resp: 19 19 20 20   Temp: 98.5 F (36.9 C) 98 F (36.7 C)  98.3 F (36.8 C)  TempSrc: Oral   Oral  SpO2: 97% 99% 98% 97%  Weight:      Height:        Intake/Output Summary (Last 24 hours) at 07/13/2021 1436 Last data filed at 07/13/2021 1300 Gross per 24 hour  Intake 1567.98 ml  Output 650 ml  Net 917.98 ml     Wt Readings from Last 3 Encounters:  06/27/21 81.2 kg  06/08/21 90.7 kg  03/04/21 90.7 kg   Physical Exam General: Alert and oriented, per son, close to his baseline Cardiovascular: S1 S2 clear, RRR. No pedal edema b/l Respiratory: Diffuse bilateral expiratory wheezing Gastrointestinal: Soft, nontender, nondistended, NBS Ext: no pedal edema bilaterally Neuro: upper extremity strength 5/5, able to lift up the right lower extremity however appears to have global weakness and poor effort    Data Reviewed:  I have personally reviewed following labs and imaging studies  Micro Results Recent Results (from the past 240 hour(s))  Culture, blood (routine x 2)     Status: None (Preliminary result)   Collection Time: 07/09/21 11:19 AM   Specimen: BLOOD LEFT HAND  Result Value Ref Range Status   Specimen Description   Final    BLOOD LEFT HAND BOTTLES DRAWN AEROBIC AND ANAEROBIC Performed at [facility], [address] [place], [place] [address]    Special Requests   Final    Blood Culture adequate volume Performed at [facility], [address], [place], [place] [address]    Culture  Setup Time   Final    GRAM POSITIVE COCCI AEROBIC BOTTLE ONLY Gram Stain Report Called to,Read Back By and Verified WithSusann Givens @ 2458 07/10/21 by STEPTR CRITICAL RESULT CALLED TO, READ BACK BY AND VERIFIED WITH: PIA Octa RN 07/11/2021 @2259  BY JW    Culture   Final    GRAM POSITIVE COCCI IDENTIFICATION TO FOLLOW REPEATING Performed at [facility], [place] [address] [place], [place] [address]    Report Status PENDING  Incomplete  Blood Culture ID Panel (Reflexed)  Status: Abnormal   Collection Time: 07/09/21 11:19 AM  Result Value Ref Range Status   Enterococcus faecalis NOT DETECTED NOT DETECTED Final   Enterococcus Faecium NOT DETECTED NOT DETECTED Final   Listeria monocytogenes NOT DETECTED NOT DETECTED Final   Staphylococcus species DETECTED (A) NOT DETECTED Final    Comment: CRITICAL RESULT CALLED TO, READ BACK BY AND VERIFIED WITH: PIA PARRISH RN 07/11/2021 @2259  BY JW    Staphylococcus aureus (BCID) NOT DETECTED NOT DETECTED Final   Staphylococcus epidermidis NOT DETECTED NOT DETECTED Final   Staphylococcus lugdunensis NOT DETECTED NOT DETECTED Final   Streptococcus species NOT DETECTED NOT DETECTED Final   Streptococcus agalactiae NOT DETECTED NOT DETECTED Final   Streptococcus pneumoniae NOT DETECTED NOT DETECTED Final   Streptococcus pyogenes NOT DETECTED NOT DETECTED Final   A.calcoaceticus-baumannii NOT DETECTED NOT DETECTED Final   Bacteroides fragilis NOT DETECTED NOT DETECTED Final   Enterobacterales NOT DETECTED NOT DETECTED Final   Enterobacter cloacae complex NOT DETECTED NOT DETECTED Final   Escherichia coli NOT DETECTED NOT DETECTED Final   Klebsiella aerogenes NOT DETECTED NOT DETECTED Final   Klebsiella oxytoca NOT DETECTED NOT DETECTED Final   Klebsiella pneumoniae NOT DETECTED NOT DETECTED Final   Proteus species NOT DETECTED NOT DETECTED Final   Salmonella species NOT DETECTED NOT DETECTED Final   Serratia marcescens NOT DETECTED NOT DETECTED Final   Haemophilus influenzae NOT DETECTED NOT DETECTED Final   Neisseria meningitidis NOT DETECTED NOT DETECTED Final   Pseudomonas aeruginosa NOT DETECTED NOT DETECTED Final   Stenotrophomonas maltophilia NOT DETECTED NOT DETECTED Final   Candida albicans NOT DETECTED NOT DETECTED Final   Candida auris NOT DETECTED NOT DETECTED Final   Candida glabrata NOT  DETECTED NOT DETECTED Final   Candida krusei NOT DETECTED NOT DETECTED Final   Candida parapsilosis NOT DETECTED NOT DETECTED Final   Candida tropicalis NOT DETECTED NOT DETECTED Final   Cryptococcus neoformans/gattii NOT DETECTED NOT DETECTED Final    Comment: Performed at [facility], [address], [place], [place] [address]  Culture, blood (routine x 2)     Status: None (Preliminary result)   Collection Time: 07/09/21 11:31 AM   Specimen: Right Antecubital; Blood  Result Value Ref Range Status   Specimen Description   Final    RIGHT ANTECUBITAL BOTTLES DRAWN AEROBIC AND ANAEROBIC   Special Requests Blood Culture adequate volume  Final   Culture   Final    NO GROWTH 4 DAYS Performed at [facility], [address], [place], [place] [address]    Report Status PENDING  Incomplete    Radiology Reports CT ABDOMEN PELVIS WO CONTRAST  Result Date: 06/27/2021 CLINICAL DATA:  Abdominal pain. Strong urine smell. Elevated creatinine. EXAM: CT ABDOMEN AND PELVIS WITHOUT CONTRAST TECHNIQUE: Multidetector CT imaging of the abdomen and pelvis was performed following the standard protocol without IV contrast. COMPARISON:  11/03/2017. FINDINGS: Lower chest: No acute abnormality. Hepatobiliary: No focal liver abnormality is seen. No gallstones, gallbladder wall thickening, or biliary dilatation. Pancreas: Unremarkable. No pancreatic ductal dilatation or surrounding inflammatory changes. Spleen: Normal in size without focal abnormality. Adrenals/Urinary Tract: No adrenal masses. No right kidney. Mild left renal cortical thinning. No renal stone or hydronephrosis. Normal left ureter. Dilated tubular structure extends from just below the right adrenal gland to the right posterior bladder at the level of the ureteral trigone. This dilated ureter is contiguous with a bladder filling defect, also of the same attenuation. This appearance is concerning for a bladder  malignancy, but is unchanged from the  11/03/2017 exam strongly favoring a benign etiology. Bladder is minimally distended, otherwise unremarkable. Stomach/Bowel: Normal stomach. Small bowel and colon are normal in caliber. No wall thickening. Subtle hazy opacity lies adjacent to the proximal to mid sigmoid colon where there are also multiple diverticula. This is not evident on the prior CT. No other evidence of pericolonic inflammation. Normal appendix is visualized. Vascular/Lymphatic: Aortic atherosclerosis. No aneurysm. No enlarged lymph nodes. Reproductive: Prostate is normal in size. Other: Small fat containing left inguinal hernia.  No ascites. Musculoskeletal: No fracture or acute finding.  No bone lesion. IMPRESSION: 1. Subtle hazy opacity lies adjacent to the proximal to mid sigmoid colon, where there are multiple diverticula. This is an equivocal finding, but mild uncomplicated diverticulitis should be consider if this correlates clinically. No other evidence of an acute abnormality within the abdomen or pelvis. 2. Dilated tubular structure that is contiguous with a mass projecting within the bladder. There is no visualized right kidney, presumably congenitally absent. The dilated tubular structure is likely the right gonadal vein. However, the course parallels the expected course of a right ureter. Both the bladder mass and the dilated tubular structure are stable compared to the 11/03/2017 CT. 3. Aortic atherosclerosis. Electronically Signed   By: Lajean Manes M.D.   On: 06/27/2021 13:49   CT Head Wo Contrast  Result Date: 06/27/2021 CLINICAL DATA:  Altered mental status. EXAM: CT HEAD WITHOUT CONTRAST TECHNIQUE: Contiguous axial images were obtained from the base of the skull through the vertex without intravenous contrast. COMPARISON:  December 21, 2019. FINDINGS: Brain: Mild chronic ischemic white matter disease is noted. Stable mild ventricular prominence is noted which may be due to mild cortical atrophy. No mass effect or midline  shift is noted. There is no evidence of mass lesion, hemorrhage or acute infarction. Vascular: No hyperdense vessel or unexpected calcification. Skull: Normal. Negative for fracture or focal lesion. Sinuses/Orbits: No acute finding. Other: None. IMPRESSION: No acute intracranial abnormality seen. Stable mild ventricular dilatation as described above. Electronically Signed   By: Marijo Conception M.D.   On: 06/27/2021 13:26   US SCROTUM  Result Date: 07/05/2021 CLINICAL DATA:  Edema EXAM: ULTRASOUND OF SCROTUM TECHNIQUE: Complete ultrasound examination of the testicles, epididymis, and other scrotal structures was performed. COMPARISON:  None. FINDINGS: Right testicle Measurements: 4.5 x 2.9 x 2.2 cm. No mass or microlithiasis visualized. Left testicle Measurements: 4 x 2.7 x 3 cm. No mass or microlithiasis visualized. Right epididymis:  Normal size.  7 mm epididymal head cyst. Left epididymis:  Normal in size and appearance. Hydrocele: Moderate right hydrocele. Moderate to large left hydrocele. Varicocele:  None visualized. Diffuse scrotal skin thickening and subcutaneous edema with no focal collection identified. IMPRESSION: 1. No testicular mass identified. 2. Bilateral moderate to large hydroceles. 3. Diffuse scrotal skin thickening and subcutaneous edema with no focal collection identified. 4. Right epididymal head cyst. Electronically Signed   By: Ofilia Neas M.D.   On: 07/05/2021 11:56   DG Chest Port 1 View  Result Date: 07/13/2021 CLINICAL DATA:  Short of breath.  History COPD EXAM: PORTABLE CHEST 1 VIEW COMPARISON:  07/09/2021 FINDINGS: Heart size upper normal. Normal vascularity. Interval improvement in bibasilar atelectasis. Improved lung volume compared to the prior study. IMPRESSION: Improved lung volume and bibasilar atelectasis compared with the prior study. No new finding. Electronically Signed   By: Franchot Gallo M.D.   On: 07/13/2021 08:45   DG Chest [facility]  Result Date:  07/09/2021 CLINICAL DATA:  75 year old male with shortness of breath. EXAM: PORTABLE CHEST - 1 VIEW COMPARISON:  07/07/2021 FINDINGS: The mediastinal contours are within normal limits. Unchanged mild cardiomegaly. Low lung volumes. Bibasilar subsegmental atelectasis, slightly more conspicuous than comparison. No pleural effusion or pneumothorax. No acute osseous abnormality. IMPRESSION: Low lung volumes bibasilar subsegmental atelectasis. Electronically Signed   By: Ruthann Cancer M.D.   On: 07/09/2021 10:10   DG CHEST PORT 1 VIEW  Result Date: 07/07/2021 CLINICAL DATA:  Fluid overload EXAM: PORTABLE CHEST 1 VIEW COMPARISON:  07/03/2021, 06/27/2021, 12/21/2019 FINDINGS: Low lung volumes. Borderline cardiomegaly. Mild bronchovascular crowding due to low lung volume. No pleural effusion or pneumothorax. IMPRESSION: Hypoventilatory changes with borderline cardiomegaly Electronically Signed   By: Donavan Foil M.D.   On: 07/07/2021 23:09   DG CHEST PORT 1 VIEW  Result Date: 07/03/2021 CLINICAL DATA:  Shortness of breath EXAM: PORTABLE CHEST 1 VIEW COMPARISON:  Chest x-ray 06/27/2021 FINDINGS: Heart size is upper normal. Mediastinum appears stable. No focal consolidation identified. No significant pleural effusion. No pneumothorax. IMPRESSION: No acute intrathoracic process identified. Electronically Signed   By: Ofilia Neas M.D.   On: 07/03/2021 12:43   DG Chest Port 1 View  Result Date: 06/27/2021 CLINICAL DATA:  Altered mental status in a 75 year old male. EXAM: PORTABLE CHEST 1 VIEW COMPARISON:  June 09, 2021. FINDINGS: EKG leads project over the chest. Trachea midline. Cardiomediastinal contours and hilar structures stable. Linear opacity present over the LEFT hemidiaphragm. No visible pneumothorax. No sign of pleural effusion. On limited assessment no acute skeletal process. IMPRESSION: Linear opacity over the LEFT hemidiaphragm may represent atelectasis or scarring. No acute cardiopulmonary  process. Electronically Signed   By: Zetta Bills M.D.   On: 06/27/2021 12:10    Lab Data:  CBC: Recent Labs  Lab 07/07/21 0431 07/08/21 0357 07/09/21 0438 07/10/21 0345 07/13/21 0615  WBC 10.4 10.6* 14.8* 11.6* 10.5  HGB 9.2* 9.7* 10.2* 9.3* 9.7*  HCT 28.3* 29.2* 30.4* 29.1* 30.8*  MCV 99.3 99.0 98.1 101.7* 99.7  PLT 243 264 316 290 510*   Basic Metabolic Panel: Recent Labs  Lab 07/08/21 0357 07/09/21 0438 07/10/21 0345 07/11/21 0507 07/12/21 0505 07/13/21 0615  NA 146* 142 143 136  --  144  K 3.2* 3.3* 3.2* 3.0*  --  2.8*  CL 111 106 111 105  --  112*  CO2 23 24 23 23   --  20*  GLUCOSE 111* 120* 106* 112*  --  77  BUN 88* 62* 49* 35*  --  27*  CREATININE 4.50* 3.00* 2.32* 1.71*  --  1.27*  CALCIUM 8.0* 7.9* 7.7* 8.0*  --  8.5*  MG 2.0 1.7 1.8 1.7 2.2  --    GFR: Estimated Creatinine Clearance: 50.1 mL/min (A) (by C-G formula based on SCr of 1.27 mg/dL (H)). Liver Function Tests: No results for input(s): AST, ALT, ALKPHOS, BILITOT, PROT, ALBUMIN in the last 168 hours.  No results for input(s): LIPASE, AMYLASE in the last 168 hours. No results for input(s): AMMONIA in the last 168 hours. Coagulation Profile: No results for input(s): INR, PROTIME in the last 168 hours. Cardiac Enzymes: Recent Labs  Lab 07/08/21 0357 07/09/21 0438 07/10/21 0345 07/11/21 0507 07/12/21 0505  CKTOTAL 2,647* 1,848* 1,231* 822* 703*   BNP (last 3 results) No results for input(s): PROBNP in the last 8760 hours. HbA1C: No results for input(s): HGBA1C in the last 72 hours. CBG: No results for input(s): GLUCAP in  the last 168 hours. Lipid Profile: No results for input(s): CHOL, HDL, LDLCALC, TRIG, CHOLHDL, LDLDIRECT in the last 72 hours. Thyroid Function Tests: No results for input(s): TSH, T4TOTAL, FREET4, T3FREE, THYROIDAB in the last 72 hours. Anemia Panel: No results for input(s): VITAMINB12, FOLATE, FERRITIN, TIBC, IRON, RETICCTPCT in the last 72 hours. Urine  analysis:    Component Value Date/Time   COLORURINE STRAW (A) 07/09/2021 1118   APPEARANCEUR CLEAR 07/09/2021 1118   LABSPEC 1.008 07/09/2021 1118   PHURINE 8.0 07/09/2021 1118   GLUCOSEU NEGATIVE 07/09/2021 1118   HGBUR MODERATE (A) 07/09/2021 1118   BILIRUBINUR NEGATIVE 07/09/2021 1118   [place] 07/09/2021 1118   PROTEINUR NEGATIVE 07/09/2021 1118   UROBILINOGEN 0.2 06/05/2015 1821   NITRITE NEGATIVE 07/09/2021 1118   LEUKOCYTESUR NEGATIVE 07/09/2021 1118     Long Brimage M.D. [facility] 07/13/2021, 2:36 PM  Available via Epic secure chat 7am-7pm After 7 pm, please refer to night coverage provider listed on amion.

## 2021-07-13 NOTE — Progress Notes (Signed)
07/13/2021 0030:  Pt continues to scream out, "WATER! WATER! WATER!" Despite being given two cups of water by the NT. Pt has pulled off telemetry leads, gauze wrapped around IV and gown. Multiple attempts on educating pt on proper use of call bell have not been effective. Pt mentation is A&Ox4 and pt responds appropriately when asked questions. This RN will continue to educate.

## 2021-07-13 NOTE — Progress Notes (Signed)
Speech Language Pathology Treatment: Dysphagia  Patient Details Name: Lucas [name] MRN: [id number] DOB: May 28, 1946 Today's Date: 07/13/2021 Time: 1749-4496 SLP Time Calculation (min) (ACUTE ONLY): 24 min  Assessment / Plan / Recommendation Clinical Impression  Pt seen for ongoing dysphagia intervention. MD requested repeat evaluation, Pt has been on caseload. BSE completed Tuesday and D3 and thin was recommended. RN reports that Pt has been coughing while drinking liquids and thinks he is drinking too quickly. She indicates that he has been taking his pills whole with water without incident. SLP raised HOB as upright as possible (air mattress) and assisted Pt with dinner meal. Pt with occasional immediate strong coughing after taking large sips of thin and NTL despite cues to take small sips. Pt himself stated that he thinks he drinks too fast. He consumed a few bites of mech soft, but reports distaste of foods. Pt is at risk for aspiration given given bed bound status, prolonged hospitalization, Parkinson's disease, dependent feeder status, and current deconditioning. Can complete  MBSS tomorrow, however Pt may be difficult to position due to bed sores.    HPI HPI: Lucas [name] is a 75 y.o. male with medical history significant of Parkinson's disease, prior CVA, COPD, tobacco use, hypertension, chronic lower extremity edema, hyperlipidemia, normally lives at home.  Family had brought him to ED due to what appears to be a chronic decline in his overall condition with confusion.  Patient was admitted for acute metabolic encephalopathy and associated acute kidney injury that appears to be due to rhabdomyolysis.  Patient continues to require aggressive IV fluids per nephrology.  Continue to monitor creatinine trend and follow for outcomes.  Nephrology and palliative care following. BSE requested.      SLP Plan  Continue with current plan of care;MBS      Recommendations for follow up therapy are  one component of a multi-disciplinary discharge planning process, led by the attending physician.  Recommendations may be updated based on patient status, additional functional criteria and insurance authorization.    Recommendations  Diet recommendations: Dysphagia 3 (mechanical soft);Thin liquid Liquids provided via: Cup;Straw Medication Administration: Whole meds with puree Supervision: Staff to assist with self feeding;Full supervision/cueing for compensatory strategies Compensations: Slow rate;Small sips/bites Postural Changes and/or Swallow Maneuvers: Seated upright 90 degrees;Upright 30-60 min after meal                Oral Care Recommendations: Oral care BID;Staff/trained caregiver to provide oral care Follow Up Recommendations: Skilled nursing-short term rehab (<3 hours/day) Assistance recommended at discharge: Frequent or constant Supervision/Assistance SLP Visit Diagnosis: Dysphagia, unspecified (R13.10) Plan: Continue with current plan of care;MBS       Thank you,  Genene Churn, CCC-SLP [phone]                  07/13/2021, 6:10 PM

## 2021-07-13 NOTE — Progress Notes (Signed)
07/13/2021 0140:  This RN goes into pt room to administer IV antibiotic during system downtime. Pt screams at this RN, "give me water now you black nigger bitch." RN continues pt care. Pt starts yelling, "hurry the fuck up bitch." This RN explains to pt that his language is inappropriate and that he has had 3 cups of water within a 2 hour span. RN continues pt care, cleaning the room, and picking up telemetry monitor that pt has thrown on the floor. Pt asks for water again and RN tries to compromise by telling pt that she will bring more water shortly. Pt yells out, "that's bullshit." This RN exits the room and assists other assigned pt's with care. This RN will alert charge RN of pt behavior.

## 2021-07-13 NOTE — Progress Notes (Signed)
Patient continues to yell out.

## 2021-07-13 NOTE — Progress Notes (Signed)
Physical Therapy Treatment Patient Details Name: Lucas [name] MRN: [id number] DOB: 01-16-1946 Today's Date: 07/13/2021   History of Present Illness Lucas [name] is a 75 y.o. male presents with chronic decline in his overall condition with confusion. Pt admitted for acute metabolic encephalopathy and associated acute kidney injury that appears to be due to rhabdomyolysis. PMH: Parkinson's disease, prior CVA, COPD, tobacco use, HTN, chronic lower extremity edema, hyperlipidemia    PT Comments    Patient demonstrates slow labored movement for sitting up at bedside with fair carryover for using bed rail to help pull self to sitting, once seated  tends to lean backwards requiring frequent verbal/tactile cueing to avoid falling backwards, has difficulty scooting forward on air mattress bed due to falling forward, able to keep trunk in midline for up to 2-3 minutes supporting self with BUE and unable to attempt sit to stands due to BLE weakness.  Patient required max assist to put back to bed and 2 person assist to scoot to [facility].  Patient will benefit from continued skilled physical therapy in hospital and recommended venue below to increase strength, balance, endurance for safe ADLs and gait.     Recommendations for follow up therapy are one component of a multi-disciplinary discharge planning process, led by the attending physician.  Recommendations may be updated based on patient status, additional functional criteria and insurance authorization.  Follow Up Recommendations  Skilled nursing-short term rehab (<3 hours/day)     Assistance Recommended at Discharge Intermittent Supervision/Assistance  Equipment Recommendations  None recommended by PT    Recommendations for Other Services       Precautions / Restrictions Precautions Precautions: Fall Restrictions Weight Bearing Restrictions: No     Mobility  Bed Mobility Overal bed mobility: Needs Assistance Bed Mobility: Supine to  Sit;Sit to Supine     Supine to sit: Max assist Sit to supine: Max assist   General bed mobility comments: increased time, labored movement, c/o increased pain with pressure to/movement of BLE    Transfers                        Ambulation/Gait                   Stairs             Wheelchair Mobility    Modified Rankin (Stroke Patients Only)       Balance Overall balance assessment: Needs assistance Sitting-balance support: Feet supported;No upper extremity supported Sitting balance-Leahy Scale: Poor Sitting balance - Comments: frequent leaning backwards once seated and falling forward when scooting to EOB Postural control: Posterior lean                                  Cognition Arousal/Alertness: Awake/alert Behavior During Therapy: WFL for tasks assessed/performed Overall Cognitive Status: Within Functional Limits for tasks assessed                                          Exercises General Exercises - Lower Extremity Long Arc Quad: Seated;AROM;AAROM;Strengthening;Both;10 reps Hip Flexion/Marching: Seated;AAROM;Strengthening;Both;10 reps Other Exercises Other Exercises: PROM and stretching to bilateral ankles 2 x 1 minute holds    General Comments        Pertinent Vitals/Pain Pain Assessment: Faces Faces Pain Scale: Hurts little  more Pain Location: Bilateral LE with bed mobility Pain Descriptors / Indicators: Sore;Grimacing Pain Intervention(s): Limited activity within patient's tolerance;Monitored during session;Repositioned    Home Living                          Prior Function            PT Goals (current goals can now be found in the care plan section) Acute Rehab PT Goals Patient Stated Goal: return home after rehab PT Goal Formulation: With patient Time For Goal Achievement: 07/18/21 Potential to Achieve Goals: Good Progress towards PT goals: Progressing toward goals     Frequency    Min 3X/week      PT Plan Current plan remains appropriate    Co-evaluation PT/OT/SLP Co-Evaluation/Treatment: Yes Reason for Co-Treatment: To address functional/ADL transfers;Complexity of the patient's impairments (multi-system involvement) PT goals addressed during session: Mobility/safety with mobility;Balance;Strengthening/ROM        AM-PAC PT "6 Clicks" Mobility   Outcome Measure  Help needed turning from your back to your side while in a flat bed without using bedrails?: A Lot Help needed moving from lying on your back to sitting on the side of a flat bed without using bedrails?: A Lot Help needed moving to and from a bed to a chair (including a wheelchair)?: Total Help needed standing up from a chair using your arms (e.g., wheelchair or bedside chair)?: Total Help needed to walk in hospital room?: Total Help needed climbing 3-5 steps with a railing? : Total 6 Click Score: 8    End of Session   Activity Tolerance: Patient tolerated treatment well;Patient limited by fatigue;Patient limited by pain Patient left: in bed;with call bell/phone within reach Nurse Communication: Mobility status PT Visit Diagnosis: Other abnormalities of gait and mobility (R26.89);Muscle weakness (generalized) (M62.81);Unsteadiness on feet (R26.81)     Time: 3151-7616 PT Time Calculation (min) (ACUTE ONLY): 24 min  Charges:  $Therapeutic Exercise: 8-22 mins $Therapeutic Activity: 8-22 mins                     10:54 AM, 07/13/21 Lonell Grandchild, MPT Physical Therapist with [facility] [phone] office [phone] mobile phone

## 2021-07-13 NOTE — Progress Notes (Signed)
07/13/2021 0100:  Pt continues to scream and yell.

## 2021-07-14 ENCOUNTER — Inpatient Hospital Stay (HOSPITAL_COMMUNITY): Payer: Medicare Other

## 2021-07-14 DIAGNOSIS — N179 Acute kidney failure, unspecified: Secondary | ICD-10-CM | POA: Diagnosis not present

## 2021-07-14 DIAGNOSIS — G9341 Metabolic encephalopathy: Secondary | ICD-10-CM | POA: Diagnosis not present

## 2021-07-14 DIAGNOSIS — N170 Acute kidney failure with tubular necrosis: Secondary | ICD-10-CM | POA: Diagnosis not present

## 2021-07-14 DIAGNOSIS — J9602 Acute respiratory failure with hypercapnia: Secondary | ICD-10-CM | POA: Diagnosis not present

## 2021-07-14 LAB — CULTURE, BLOOD (ROUTINE X 2): Special Requests: ADEQUATE

## 2021-07-14 MED ORDER — ENSURE ENLIVE PO LIQD
237.0000 mL | Freq: Four times a day (QID) | ORAL | Status: DC
  Administered 2021-07-14 – 2021-07-21 (×22): 237 mL via ORAL

## 2021-07-14 NOTE — Progress Notes (Signed)
Physical Therapy Treatment Patient Details Name: Lucas [name] MRN: [id number] DOB: Nov 10, 1945 Today's Date: 07/14/2021   History of Present Illness Lucas [name] is a 75 y.o. male presents with chronic decline in his overall condition with confusion. Pt admitted for acute metabolic encephalopathy and associated acute kidney injury that appears to be due to rhabdomyolysis. PMH: Parkinson's disease, prior CVA, COPD, tobacco use, HTN, chronic lower extremity edema, hyperlipidemia    PT Comments    Patient agreeable for therapy and his son present at bedside.  Patient demonstrates slow labored movement for sitting up at bedside with c/o severe pain BLE with pressure, poor sitting balance with frequent falling backwards, able to keep trunk in midline for up to 2-3 minutes holding on to bed rail with RUE, poor carryover for attempting to scoot forward due to poor sitting balance and generalized weakness.  Patient able to extend knees slightly against gravity, unable to flex hips against gravity and 0/5 bilateral ankle dorsiflexion.  Patient unable to attempt sit to stands and required Max assist to reposition when put back to bed.  Patient will benefit from continued physical therapy in hospital and recommended venue below to increase strength, balance, endurance for safe ADLs and gait.     Recommendations for follow up therapy are one component of a multi-disciplinary discharge planning process, led by the attending physician.  Recommendations may be updated based on patient status, additional functional criteria and insurance authorization.  Follow Up Recommendations  Skilled nursing-short term rehab (<3 hours/day)     Assistance Recommended at Discharge Intermittent Supervision/Assistance  Equipment Recommendations  None recommended by PT    Recommendations for Other Services       Precautions / Restrictions Precautions Precautions: Fall Restrictions Weight Bearing Restrictions: No      Mobility  Bed Mobility Overal bed mobility: Needs Assistance Bed Mobility: Supine to Sit;Sit to Supine     Supine to sit: Max assist Sit to supine: Max assist   General bed mobility comments: slow labored movement with c/o severe pain BLE with pressure    Transfers                        Ambulation/Gait                   Stairs             Wheelchair Mobility    Modified Rankin (Stroke Patients Only)       Balance Overall balance assessment: Needs assistance Sitting-balance support: Feet supported;No upper extremity supported Sitting balance-Leahy Scale: Poor Sitting balance - Comments: frequent leaning backwards Postural control: Posterior lean                                  Cognition Arousal/Alertness: Awake/alert Behavior During Therapy: WFL for tasks assessed/performed Overall Cognitive Status: Within Functional Limits for tasks assessed                                          Exercises General Exercises - Lower Extremity Ankle Circles/Pumps: PROM;Both;10 reps Long Arc Quad: Seated;AROM;AAROM;Strengthening;Both;10 reps Hip Flexion/Marching: Seated;AAROM;Strengthening;Both;10 reps    General Comments        Pertinent Vitals/Pain Pain Assessment: 0-10 Faces Pain Scale: Hurts even more Pain Location: BLE with movement Pain Descriptors / Indicators:  Grimacing;Guarding;Sore Pain Intervention(s): Limited activity within patient's tolerance;Monitored during session;Repositioned    Home Living                          Prior Function            PT Goals (current goals can now be found in the care plan section) Acute Rehab PT Goals Patient Stated Goal: return home after rehab PT Goal Formulation: With patient Time For Goal Achievement: 07/18/21 Potential to Achieve Goals: Good Progress towards PT goals: Progressing toward goals    Frequency    Min 3X/week      PT Plan  Current plan remains appropriate    Co-evaluation              AM-PAC PT "6 Clicks" Mobility   Outcome Measure  Help needed turning from your back to your side while in a flat bed without using bedrails?: A Lot Help needed moving from lying on your back to sitting on the side of a flat bed without using bedrails?: A Lot Help needed moving to and from a bed to a chair (including a wheelchair)?: Total Help needed standing up from a chair using your arms (e.g., wheelchair or bedside chair)?: Total Help needed to walk in hospital room?: Total Help needed climbing 3-5 steps with a railing? : Total 6 Click Score: 8    End of Session   Activity Tolerance: Patient tolerated treatment well;Patient limited by fatigue Patient left: in bed;with call bell/phone within reach;with family/visitor present Nurse Communication: Mobility status PT Visit Diagnosis: Other abnormalities of gait and mobility (R26.89);Muscle weakness (generalized) (M62.81);Unsteadiness on feet (R26.81)     Time: 2751-7001 PT Time Calculation (min) (ACUTE ONLY): 30 min  Charges:  $Therapeutic Exercise: 8-22 mins $Therapeutic Activity: 8-22 mins                     2:57 PM, 07/14/21 Lonell Grandchild, MPT Physical Therapist with [facility] [phone] office [phone] mobile phone

## 2021-07-14 NOTE — Progress Notes (Signed)
[facility]                                                                              Patient Demographics  Lucas [name], is a 75 y.o. male, DOB - [birth year], NID:[id number]  Admit date - 06/27/2021   Admitting Physician No admitting provider for patient encounter.  Outpatient Primary MD for the patient is Zhou-Talbert, [name], MD  Outpatient specialists:   LOS - 16  days   Medical records reviewed and are as summarized below:    Chief Complaint  Patient presents with   Altered Mental Status       Brief summary   Patient is a 75 year old, COPD, tobacco use, hypertension, chronic lower extremity edema, hyperlipidemia, lives at home.  Family brought him to ED due to what appeared to be a chronic decline in his overall condition with confusion.  Patient was admitted for acute metabolic encephalopathy, associated acute kidney injury, rhabdomyolysis.  He was placed on aggressive IV fluids per nephrology.  Palliative medicine was also consulted.   Assessment & Plan    Principal Problem: Acute Respiratory failure with hypoxia, ?bacteremia -1/4 blood cultures with gram-positive cocci, likely contaminant, pending sensitivities.  Vancomycin was discontinued - Family requesting short-term rehab however given his prognosis and decline, may not tolerate rehab.  At baseline, bedbound has been declining, underlying dementia, Parkinson's disease. -SLP evaluation confirms aspiration risk, placed on dysphagia 3 diet  -Continue Augmentin, nebs, prednisone 40 mg daily for 3 days, wheezing better today   Active Problems: Acute metabolic encephalopathy, superimposed on dementia -TSH, ammonia within normal limits, hold sedating meds -Agitation is improving, continue Seroquel (started 11/17) 25 mg at 8 PM daily, weaned xanax to 0.5mg  qhs -Continue oral Haldol as needed for agitation.  Currently alert, watching TV.  Appears close to his baseline.    Acute kidney injury,  multifactorial exacerbated by rhabdomyolysis, NAGMA -Creatinine as high as 8.4 on 07/03/2021, now improved to 1.2 -Nephrology was consulted, not a candidate for hemodialysis, overall poor prognosis, nephrology signed off on 11/14 -Patient was placed on IV fluid hydration, creatinine improved, hence discontinued -Continue sodium bicarb  Hypokalemia -Potassium was replaced on 11/17   Transaminitis secondary to rhabdomyolysis -Continues to improve    Parkinson's disease ([place]), dementia -Continue Sinemet  Hypertension -BP was somewhat elevated, added Norvasc 5 mg daily.    COPD -Still has coarse breath sounds, likely due to aspiration, continue scheduled nebs, Augmentin, prednisone.    Bladder mass -Appears to have been present on CT imaging from 2019 and unchanged, outpatient follow-up with urology  CVA generalized debility, bedbound severe -Overall global weakness, per nursing staff able to move with [facility] lift.  Family pursuing SNF/STR   Pressure Injury Documentation: Pressure Injury 06/28/21 Heel Left Deep Tissue Pressure Injury - Purple or maroon localized area of discolored intact skin or blood-filled blister due to damage of underlying soft tissue from pressure and/or shear. (Active)  06/28/21 0800  Location: Heel  Location Orientation: Left  Staging: Deep Tissue Pressure Injury - Purple or maroon localized area of discolored intact skin or blood-filled blister due to damage of underlying soft tissue from pressure  and/or shear.  Wound Description (Comments):   Present on Admission: Yes     Pressure Injury 06/28/21 Buttocks Left Unstageable - Full thickness tissue loss in which the base of the injury is covered by slough (yellow, tan, gray, green or brown) and/or eschar (tan, brown or black) in the wound bed. Wound had black/gray thick cove (Active)  06/28/21 0800  Location: Buttocks  Location Orientation: Left  Staging: Unstageable - Full thickness tissue loss in which the  base of the injury is covered by slough (yellow, tan, gray, green or brown) and/or eschar (tan, brown or black) in the wound bed.  Wound Description (Comments): Wound had black/gray thick covering, no drainage  Present on Admission: Yes     Pressure Injury 07/02/21 Rectum Mid Stage 2 -  Partial thickness loss of dermis presenting as a shallow open injury with a red, pink wound bed without slough. skin red, open, small amount of drainage (Active)  07/02/21 0845  Location: Rectum  Location Orientation: Mid  Staging: Stage 2 -  Partial thickness loss of dermis presenting as a shallow open injury with a red, pink wound bed without slough.  Wound Description (Comments): skin red, open, small amount of drainage  Present on Admission:     Code Status: DNR/DNI DVT Prophylaxis:  heparin injection 5,000 Units Start: 06/27/21 2200   Level of Care: Level of care: Med-Surg Family Communication: Discussed in detail and updated patient's son at the bedside on 11/17   Disposition Plan:     Status is: Inpatient  Remains inpatient appropriate because: Very debilitated, acute metabolic encephalopathy overall poor prognosis, family requesting SNF  Time Spent in minutes 25 minutes  Procedures:  None  Consultants:   Palliative medicine Nephrology  Antimicrobials:   Anti-infectives (From admission, onward)    Start     Dose/Rate Route Frequency Ordered Stop   07/13/21 1115  amoxicillin-clavulanate (AUGMENTIN) 875-125 MG per tablet 1 tablet        1 tablet Oral Every 12 hours 07/13/21 1025     07/11/21 1600  vancomycin (VANCOREADY) IVPB 1000 mg/200 mL  Status:  Discontinued        1,000 mg 200 mL/hr over 60 Minutes Intravenous Every 24 hours 07/10/21 1545 07/11/21 1029   07/11/21 1400  Ampicillin-Sulbactam (UNASYN) 3 g in sodium chloride 0.9 % 100 mL IVPB  Status:  Discontinued        3 g 200 mL/hr over 30 Minutes Intravenous Every 6 hours 07/11/21 1029 07/13/21 1025   07/10/21 1630   vancomycin (VANCOREADY) IVPB 1750 mg/350 mL        1,750 mg 175 mL/hr over 120 Minutes Intravenous  Once 07/10/21 1544 07/10/21 2035   07/09/21 1230  piperacillin-tazobactam (ZOSYN) IVPB 3.375 g  Status:  Discontinued        3.375 g 12.5 mL/hr over 240 Minutes Intravenous Every 8 hours 07/09/21 1142 07/11/21 1029   06/27/21 1415  ciprofloxacin (CIPRO) IVPB 400 mg        400 mg 200 mL/hr over 60 Minutes Intravenous  Once 06/27/21 1411 06/27/21 1531   06/27/21 1415  metroNIDAZOLE (FLAGYL) IVPB 500 mg        500 mg 100 mL/hr over 60 Minutes Intravenous  Once 06/27/21 1411 06/27/21 1531          Medications  Scheduled Meds:  ALPRAZolam  0.5 mg Oral QHS   amLODipine  5 mg Oral Daily   amoxicillin-clavulanate  1 tablet Oral Q12H   aspirin EC  81 mg Oral Daily   carbidopa-levodopa  1 tablet Oral TID   Chlorhexidine Gluconate Cloth  6 each Topical Daily   gabapentin  100 mg Oral BID   heparin  5,000 Units Subcutaneous Q8H   ipratropium-albuterol  3 mL Nebulization BID   nystatin   Topical BID   predniSONE  40 mg Oral Q breakfast   QUEtiapine  25 mg Oral QHS   sodium bicarbonate  650 mg Oral BID   Continuous Infusions:   PRN Meds:.acetaminophen **OR** acetaminophen, albuterol, alum & mag hydroxide-simeth, haloperidol, hydrALAZINE, HYDROmorphone (DILAUDID) injection, ondansetron **OR** ondansetron (ZOFRAN) IV, oxyCODONE      Subjective:   Lucas [name] was seen and examined today.  Alert and awake, no acute issues overnight, appears agitation was better last night.  No fevers or chills.  Denies any specific complaints.   Objective:   Vitals:   07/13/21 2003 07/13/21 2017 07/13/21 2233 07/14/21 0456  BP:   126/77 (!) 170/80  Pulse:   79 81  Resp:   18 16  Temp: 98 F (36.7 C)  98.5 F (36.9 C) 99.2 F (37.3 C)  TempSrc: Oral  Oral Oral  SpO2:  97% 92% 96%  Weight:      Height:        Intake/Output Summary (Last 24 hours) at 07/14/2021 1047 Last data filed at  07/13/2021 1300 Gross per 24 hour  Intake 120 ml  Output --  Net 120 ml     Wt Readings from Last 3 Encounters:  06/27/21 81.2 kg  06/08/21 90.7 kg  03/04/21 90.7 kg   Physical Exam General: Alert and oriented, NAD Cardiovascular: S1 S2 clear, RRR. No pedal edema b/l Respiratory: Coarse breath sounds bilaterally Gastrointestinal: Soft, nontender, nondistended, NBS Ext: no pedal edema bilaterally   Data Reviewed:  I have personally reviewed following labs and imaging studies  Micro Results Recent Results (from the past 240 hour(s))  Culture, blood (routine x 2)     Status: Abnormal   Collection Time: 07/09/21 11:19 AM   Specimen: BLOOD LEFT HAND  Result Value Ref Range Status   Specimen Description   Final    BLOOD LEFT HAND BOTTLES DRAWN AEROBIC AND ANAEROBIC Performed at [facility], [address], [place], [place] [address]    Special Requests   Final    Blood Culture adequate volume Performed at [facility], [address], [place], [place] [address]    Culture  Setup Time   Final    GRAM POSITIVE COCCI AEROBIC BOTTLE ONLY Gram Stain Report Called to,Read Back By and Verified WithSusann Givens @ 2841 07/10/21 by STEPTR CRITICAL RESULT CALLED TO, READ BACK BY AND VERIFIED WITH: PIA [place] RN 07/11/2021 @2259  BY JW    Culture (A)  Final    STAPHYLOCOCCUS AURICULARIS THE SIGNIFICANCE OF ISOLATING THIS ORGANISM FROM A SINGLE SET OF BLOOD CULTURES WHEN MULTIPLE SETS ARE DRAWN IS UNCERTAIN. PLEASE NOTIFY THE MICROBIOLOGY DEPARTMENT WITHIN ONE WEEK IF SPECIATION AND SENSITIVITIES ARE REQUIRED. Performed at [facility], [place] [address] [place], [place] [address]    Report Status 07/14/2021 FINAL  Final  Blood Culture ID Panel (Reflexed)     Status: Abnormal   Collection Time: 07/09/21 11:19 AM  Result Value Ref Range Status   Enterococcus faecalis NOT DETECTED NOT DETECTED Final   Enterococcus Faecium NOT DETECTED NOT DETECTED Final   Listeria monocytogenes NOT  DETECTED NOT DETECTED Final   Staphylococcus species DETECTED (A) NOT DETECTED Final  Comment: CRITICAL RESULT CALLED TO, READ BACK BY AND VERIFIED WITH: PIA [place] RN 07/11/2021 @2259  BY JW    Staphylococcus aureus (BCID) NOT DETECTED NOT DETECTED Final   Staphylococcus epidermidis NOT DETECTED NOT DETECTED Final   Staphylococcus lugdunensis NOT DETECTED NOT DETECTED Final   Streptococcus species NOT DETECTED NOT DETECTED Final   Streptococcus agalactiae NOT DETECTED NOT DETECTED Final   Streptococcus pneumoniae NOT DETECTED NOT DETECTED Final   Streptococcus pyogenes NOT DETECTED NOT DETECTED Final   A.calcoaceticus-baumannii NOT DETECTED NOT DETECTED Final   Bacteroides fragilis NOT DETECTED NOT DETECTED Final   Enterobacterales NOT DETECTED NOT DETECTED Final   Enterobacter cloacae complex NOT DETECTED NOT DETECTED Final   Escherichia coli NOT DETECTED NOT DETECTED Final   Klebsiella aerogenes NOT DETECTED NOT DETECTED Final   Klebsiella oxytoca NOT DETECTED NOT DETECTED Final   Klebsiella pneumoniae NOT DETECTED NOT DETECTED Final   Proteus species NOT DETECTED NOT DETECTED Final   Salmonella species NOT DETECTED NOT DETECTED Final   Serratia marcescens NOT DETECTED NOT DETECTED Final   Haemophilus influenzae NOT DETECTED NOT DETECTED Final   Neisseria meningitidis NOT DETECTED NOT DETECTED Final   Pseudomonas aeruginosa NOT DETECTED NOT DETECTED Final   Stenotrophomonas maltophilia NOT DETECTED NOT DETECTED Final   Candida albicans NOT DETECTED NOT DETECTED Final   Candida auris NOT DETECTED NOT DETECTED Final   Candida glabrata NOT DETECTED NOT DETECTED Final   Candida krusei NOT DETECTED NOT DETECTED Final   Candida parapsilosis NOT DETECTED NOT DETECTED Final   Candida tropicalis NOT DETECTED NOT DETECTED Final   Cryptococcus neoformans/gattii NOT DETECTED NOT DETECTED Final    Comment: Performed at [facility], [address] [place], [place] [address]   Culture, blood (routine x 2)     Status: None (Preliminary result)   Collection Time: 07/09/21 11:31 AM   Specimen: Right Antecubital; Blood  Result Value Ref Range Status   Specimen Description   Final    RIGHT ANTECUBITAL BOTTLES DRAWN AEROBIC AND ANAEROBIC   Special Requests Blood Culture adequate volume  Final   Culture   Final    NO GROWTH 4 DAYS Performed at [facility], [address] [place], [place] [address]    Report Status PENDING  Incomplete    Radiology Reports CT ABDOMEN PELVIS WO CONTRAST  Result Date: 06/27/2021 CLINICAL DATA:  Abdominal pain. Strong urine smell. Elevated creatinine. EXAM: CT ABDOMEN AND PELVIS WITHOUT CONTRAST TECHNIQUE: Multidetector CT imaging of the abdomen and pelvis was performed following the standard protocol without IV contrast. COMPARISON:  11/03/2017. FINDINGS: Lower chest: No acute abnormality. Hepatobiliary: No focal liver abnormality is seen. No gallstones, gallbladder wall thickening, or biliary dilatation. Pancreas: Unremarkable. No pancreatic ductal dilatation or surrounding inflammatory changes. Spleen: Normal in size without focal abnormality. Adrenals/Urinary Tract: No adrenal masses. No right kidney. Mild left renal cortical thinning. No renal stone or hydronephrosis. Normal left ureter. Dilated tubular structure extends from just below the right adrenal gland to the right posterior bladder at the level of the ureteral trigone. This dilated ureter is contiguous with a bladder filling defect, also of the same attenuation. This appearance is concerning for a bladder malignancy, but is unchanged from the 11/03/2017 exam strongly favoring a benign etiology. Bladder is minimally distended, otherwise unremarkable. Stomach/Bowel: Normal stomach. Small bowel and colon are normal in caliber. No wall thickening. Subtle hazy opacity lies adjacent to the proximal to mid sigmoid colon where there are also multiple diverticula. This is not  evident on the  prior CT. No other evidence of pericolonic inflammation. Normal appendix is visualized. Vascular/Lymphatic: Aortic atherosclerosis. No aneurysm. No enlarged lymph nodes. Reproductive: Prostate is normal in size. Other: Small fat containing left inguinal hernia.  No ascites. Musculoskeletal: No fracture or acute finding.  No bone lesion. IMPRESSION: 1. Subtle hazy opacity lies adjacent to the proximal to mid sigmoid colon, where there are multiple diverticula. This is an equivocal finding, but mild uncomplicated diverticulitis should be consider if this correlates clinically. No other evidence of an acute abnormality within the abdomen or pelvis. 2. Dilated tubular structure that is contiguous with a mass projecting within the bladder. There is no visualized right kidney, presumably congenitally absent. The dilated tubular structure is likely the right gonadal vein. However, the course parallels the expected course of a right ureter. Both the bladder mass and the dilated tubular structure are stable compared to the 11/03/2017 CT. 3. Aortic atherosclerosis. Electronically Signed   By: Lajean Manes M.D.   On: 06/27/2021 13:49   CT Head Wo Contrast  Result Date: 06/27/2021 CLINICAL DATA:  Altered mental status. EXAM: CT HEAD WITHOUT CONTRAST TECHNIQUE: Contiguous axial images were obtained from the base of the skull through the vertex without intravenous contrast. COMPARISON:  December 21, 2019. FINDINGS: Brain: Mild chronic ischemic white matter disease is noted. Stable mild ventricular prominence is noted which may be due to mild cortical atrophy. No mass effect or midline shift is noted. There is no evidence of mass lesion, hemorrhage or acute infarction. Vascular: No hyperdense vessel or unexpected calcification. Skull: Normal. Negative for fracture or focal lesion. Sinuses/Orbits: No acute finding. Other: None. IMPRESSION: No acute intracranial abnormality seen. Stable mild ventricular dilatation as described  above. Electronically Signed   By: Marijo Conception M.D.   On: 06/27/2021 13:26   US SCROTUM  Result Date: 07/05/2021 CLINICAL DATA:  Edema EXAM: ULTRASOUND OF SCROTUM TECHNIQUE: Complete ultrasound examination of the testicles, epididymis, and other scrotal structures was performed. COMPARISON:  None. FINDINGS: Right testicle Measurements: 4.5 x 2.9 x 2.2 cm. No mass or microlithiasis visualized. Left testicle Measurements: 4 x 2.7 x 3 cm. No mass or microlithiasis visualized. Right epididymis:  Normal size.  7 mm epididymal head cyst. Left epididymis:  Normal in size and appearance. Hydrocele: Moderate right hydrocele. Moderate to large left hydrocele. Varicocele:  None visualized. Diffuse scrotal skin thickening and subcutaneous edema with no focal collection identified. IMPRESSION: 1. No testicular mass identified. 2. Bilateral moderate to large hydroceles. 3. Diffuse scrotal skin thickening and subcutaneous edema with no focal collection identified. 4. Right epididymal head cyst. Electronically Signed   By: Ofilia Neas M.D.   On: 07/05/2021 11:56   DG Chest Port 1 View  Result Date: 07/13/2021 CLINICAL DATA:  Short of breath.  History COPD EXAM: PORTABLE CHEST 1 VIEW COMPARISON:  07/09/2021 FINDINGS: Heart size upper normal. Normal vascularity. Interval improvement in bibasilar atelectasis. Improved lung volume compared to the prior study. IMPRESSION: Improved lung volume and bibasilar atelectasis compared with the prior study. No new finding. Electronically Signed   By: Franchot Gallo M.D.   On: 07/13/2021 08:45   DG Chest Port 1 View  Result Date: 07/09/2021 CLINICAL DATA:  75 year old male with shortness of breath. EXAM: PORTABLE CHEST - 1 VIEW COMPARISON:  07/07/2021 FINDINGS: The mediastinal contours are within normal limits. Unchanged mild cardiomegaly. Low lung volumes. Bibasilar subsegmental atelectasis, slightly more conspicuous than comparison. No pleural effusion or pneumothorax.  No acute osseous abnormality.  IMPRESSION: Low lung volumes bibasilar subsegmental atelectasis. Electronically Signed   By: Ruthann Cancer M.D.   On: 07/09/2021 10:10   DG CHEST PORT 1 VIEW  Result Date: 07/07/2021 CLINICAL DATA:  Fluid overload EXAM: PORTABLE CHEST 1 VIEW COMPARISON:  07/03/2021, 06/27/2021, 12/21/2019 FINDINGS: Low lung volumes. Borderline cardiomegaly. Mild bronchovascular crowding due to low lung volume. No pleural effusion or pneumothorax. IMPRESSION: Hypoventilatory changes with borderline cardiomegaly Electronically Signed   By: Donavan Foil M.D.   On: 07/07/2021 23:09   DG CHEST PORT 1 VIEW  Result Date: 07/03/2021 CLINICAL DATA:  Shortness of breath EXAM: PORTABLE CHEST 1 VIEW COMPARISON:  Chest x-ray 06/27/2021 FINDINGS: Heart size is upper normal. Mediastinum appears stable. No focal consolidation identified. No significant pleural effusion. No pneumothorax. IMPRESSION: No acute intrathoracic process identified. Electronically Signed   By: Ofilia Neas M.D.   On: 07/03/2021 12:43   DG Chest Port 1 View  Result Date: 06/27/2021 CLINICAL DATA:  Altered mental status in a 75 year old male. EXAM: PORTABLE CHEST 1 VIEW COMPARISON:  June 09, 2021. FINDINGS: EKG leads project over the chest. Trachea midline. Cardiomediastinal contours and hilar structures stable. Linear opacity present over the LEFT hemidiaphragm. No visible pneumothorax. No sign of pleural effusion. On limited assessment no acute skeletal process. IMPRESSION: Linear opacity over the LEFT hemidiaphragm may represent atelectasis or scarring. No acute cardiopulmonary process. Electronically Signed   By: Zetta Bills M.D.   On: 06/27/2021 12:10    Lab Data:  CBC: Recent Labs  Lab 07/08/21 0357 07/09/21 0438 07/10/21 0345 07/13/21 0615  WBC 10.6* 14.8* 11.6* 10.5  HGB 9.7* 10.2* 9.3* 9.7*  HCT 29.2* 30.4* 29.1* 30.8*  MCV 99.0 98.1 101.7* 99.7  PLT 264 316 290 854*   Basic Metabolic  Panel: Recent Labs  Lab 07/08/21 0357 07/09/21 0438 07/10/21 0345 07/11/21 0507 07/12/21 0505 07/13/21 0615  NA 146* 142 143 136  --  144  K 3.2* 3.3* 3.2* 3.0*  --  2.8*  CL 111 106 111 105  --  112*  CO2 23 24 23 23   --  20*  GLUCOSE 111* 120* 106* 112*  --  77  BUN 88* 62* 49* 35*  --  27*  CREATININE 4.50* 3.00* 2.32* 1.71*  --  1.27*  CALCIUM 8.0* 7.9* 7.7* 8.0*  --  8.5*  MG 2.0 1.7 1.8 1.7 2.2  --    GFR: Estimated Creatinine Clearance: 50.1 mL/min (A) (by C-G formula based on SCr of 1.27 mg/dL (H)). Liver Function Tests: No results for input(s): AST, ALT, ALKPHOS, BILITOT, PROT, ALBUMIN in the last 168 hours.  No results for input(s): LIPASE, AMYLASE in the last 168 hours. No results for input(s): AMMONIA in the last 168 hours. Coagulation Profile: No results for input(s): INR, PROTIME in the last 168 hours. Cardiac Enzymes: Recent Labs  Lab 07/08/21 0357 07/09/21 0438 07/10/21 0345 07/11/21 0507 07/12/21 0505  CKTOTAL 2,647* 1,848* 1,231* 822* 703*   BNP (last 3 results) No results for input(s): PROBNP in the last 8760 hours. HbA1C: No results for input(s): HGBA1C in the last 72 hours. CBG: No results for input(s): GLUCAP in the last 168 hours. Lipid Profile: No results for input(s): CHOL, HDL, LDLCALC, TRIG, CHOLHDL, LDLDIRECT in the last 72 hours. Thyroid Function Tests: No results for input(s): TSH, T4TOTAL, FREET4, T3FREE, THYROIDAB in the last 72 hours. Anemia Panel: No results for input(s): VITAMINB12, FOLATE, FERRITIN, TIBC, IRON, RETICCTPCT in the last 72 hours. Urine analysis:  Component Value Date/Time   COLORURINE STRAW (A) 07/09/2021 1118   APPEARANCEUR CLEAR 07/09/2021 1118   LABSPEC 1.008 07/09/2021 1118   PHURINE 8.0 07/09/2021 1118   GLUCOSEU NEGATIVE 07/09/2021 1118   HGBUR MODERATE (A) 07/09/2021 1118   BILIRUBINUR NEGATIVE 07/09/2021 1118   KETONESUR NEGATIVE 07/09/2021 1118   PROTEINUR NEGATIVE 07/09/2021 1118   UROBILINOGEN  0.2 06/05/2015 1821   NITRITE NEGATIVE 07/09/2021 1118   LEUKOCYTESUR NEGATIVE 07/09/2021 1118     Lucas [name] M.D. [facility] 07/14/2021, 10:47 AM  Available via Epic secure chat 7am-7pm After 7 pm, please refer to night coverage provider listed on amion.

## 2021-07-14 NOTE — Consult Note (Signed)
Modified Barium Swallow Progress Note  Patient Details  Name: Lucas [name] MRN: [id number] Date of Birth: Aug 21, 1946  Today's Date: 07/14/2021  Modified Barium Swallow completed.  Full report located under Chart Review in the Imaging Section.  Brief recommendations include the following:  Clinical Impression  Pt presents with mild oropharyngeal dysphagia characterized by mild oral containment with min disorganized swallow during larger volumes/regular consistencies, but eventually cleared oral cavity.  Pt with an overall delay in the initation of the swallow with all consistencies to the level of the valleculae.  This affected larger volumes of liquids primarily as thin liquids penetrated the laryngeal vestibule d/t decreased laryngeal closure, but did not result in aspiration.  Pt is at risk for aspiration with thin via larger volumes d/t impulsivity during meals.  If pt utilizes small sips, he reduces his risk for aspiration.  During transition of barium tablet during the study, pt required a repetitive swallow/liquid wash to clear vallecular space.  Recommend continuing Dysphagia 3/thin liquids with SMALL SIPS and medications given in puree with FULL supervision during meals/snacks.  ST will continue to f/u in acute setting for aspiration/swallowing precaution education and diet tolerance.   Swallow Evaluation Recommendations       SLP Diet Recommendations: Dysphagia 3 (Mech soft) solids;Thin liquid;Other (Comment) (small sips)   Liquid Administration via: Cup;Straw;Other (Comment) (small sips)   Medication Administration: Whole meds with puree   Supervision: Full supervision/cueing for compensatory strategies;Patient able to self feed   Compensations: Slow rate;Small sips/bites;Minimize environmental distractions;Effortful swallow;Multiple dry swallows after each bite/sip   Postural Changes: Seated upright at 90 degrees   Oral Care Recommendations: Oral care BID        Elvina Sidle, M.S., CCC-SLP 07/14/2021,3:20 PM

## 2021-07-15 ENCOUNTER — Inpatient Hospital Stay (HOSPITAL_COMMUNITY): Payer: Medicare Other

## 2021-07-15 DIAGNOSIS — N181 Chronic kidney disease, stage 1: Secondary | ICD-10-CM

## 2021-07-15 DIAGNOSIS — J9602 Acute respiratory failure with hypercapnia: Secondary | ICD-10-CM | POA: Diagnosis not present

## 2021-07-15 DIAGNOSIS — G9341 Metabolic encephalopathy: Secondary | ICD-10-CM | POA: Diagnosis not present

## 2021-07-15 DIAGNOSIS — N170 Acute kidney failure with tubular necrosis: Secondary | ICD-10-CM | POA: Diagnosis not present

## 2021-07-15 DIAGNOSIS — N179 Acute kidney failure, unspecified: Secondary | ICD-10-CM | POA: Diagnosis not present

## 2021-07-15 LAB — BASIC METABOLIC PANEL
Anion gap: 10 (ref 5–15)
BUN: 19 mg/dL (ref 8–23)
CO2: 22 mmol/L (ref 22–32)
Calcium: 8.5 mg/dL — ABNORMAL LOW (ref 8.9–10.3)
Chloride: 105 mmol/L (ref 98–111)
Creatinine, Ser: 1.1 mg/dL (ref 0.61–1.24)
GFR, Estimated: >60 mL/min (ref >60)
Glucose, Bld: 133 mg/dL — ABNORMAL HIGH (ref 70–99)
Potassium: 4 mmol/L (ref 3.5–5.1)
Sodium: 137 mmol/L (ref 135–145)

## 2021-07-15 LAB — CULTURE, BLOOD (ROUTINE X 2)
Culture: NO GROWTH
Special Requests: ADEQUATE

## 2021-07-15 LAB — URINALYSIS, ROUTINE W REFLEX MICROSCOPIC
Bilirubin Urine: NEGATIVE
Glucose, UA: NEGATIVE mg/dL
Hgb urine dipstick: NEGATIVE
Ketones, ur: NEGATIVE mg/dL
Leukocytes,Ua: NEGATIVE
Nitrite: NEGATIVE
Protein, ur: NEGATIVE mg/dL
Specific Gravity, Urine: 1.009 (ref 1.005–1.030)
pH: 8 (ref 5.0–8.0)

## 2021-07-15 LAB — GLUCOSE, CAPILLARY: Glucose-Capillary: 124 mg/dL — ABNORMAL HIGH (ref 70–99)

## 2021-07-15 LAB — AMMONIA: Ammonia: 18 umol/L (ref 9–35)

## 2021-07-15 LAB — TROPONIN I (HIGH SENSITIVITY): Troponin I (High Sensitivity): 10 ng/L (ref <18)

## 2021-07-15 MED ORDER — ALPRAZOLAM 0.5 MG PO TABS
0.5000 mg | ORAL_TABLET | Freq: Every day | ORAL | Status: DC
  Administered 2021-07-16 – 2021-07-21 (×6): 0.5 mg via ORAL
  Filled 2021-07-15 (×6): qty 1

## 2021-07-15 MED ORDER — GABAPENTIN 100 MG PO CAPS
100.0000 mg | ORAL_CAPSULE | Freq: Every day | ORAL | Status: DC
  Administered 2021-07-16 – 2021-07-21 (×6): 100 mg via ORAL
  Filled 2021-07-15 (×6): qty 1

## 2021-07-15 MED ORDER — QUETIAPINE FUMARATE 25 MG PO TABS
12.5000 mg | ORAL_TABLET | Freq: Every day | ORAL | Status: DC
  Administered 2021-07-15 – 2021-07-20 (×6): 12.5 mg via ORAL
  Filled 2021-07-15 (×6): qty 1

## 2021-07-15 NOTE — Progress Notes (Signed)
I was asked by Dr. Tana Coast to respond to rapid response on this patient  On my arrival, patient is laying in bed with multiple staff members in the room.  Per staff, patient had complaint of having chest pain.  He subsequently developed a staring spell and was not responding to questions.  No jerking movements reported.  Symptoms were transient and lasted several minutes after which mental status began to return back to baseline.  On exam, he does not have any focal deficits.  Strength appears to be equal bilaterally.  Neuro exam is otherwise difficult to complete since he does not completely follow commands.  Appears to have some confusion, which I am not clear if this is baseline. Blood sugar checked and he was not hypoglycemic.  Updated Dr. Tana Coast on patient's condition.  Labs are being ordered including chemistry and ammonia.  Since he had a transient change in mental status, CT head is also been ordered.  Overall vitals appear to be stable.  EKG did not show any acute ischemic changes.  He is afebrile, blood pressure and heart rate are in normal range.  Currently on room air.  Review of prior notes show that palliative care has been following.  Patient is currently DNR, but family has not transition to comfort measures and wishes to continue to "treat the treatable".  Certainly based on lab results, he may need an IV replaced and further treatments.  Dr. Tana Coast is adjusting doses on seroquel, gabapentin and xanax that he receives at night, that may be contributing to current presentation. Would continue to monitor for now.  [employer]

## 2021-07-15 NOTE — Progress Notes (Signed)
[facility]                                                                              Patient Demographics  Lucas [name], is a 75 y.o. male, DOB - [birth year], MLY:[id number]  Admit date - 06/27/2021   Admitting Physician No admitting provider for patient encounter.  Outpatient Primary MD for the patient is Zhou-Talbert, [name], MD  Outpatient specialists:   LOS - 17  days   Medical records reviewed and are as summarized below:    Chief Complaint  Patient presents with   Altered Mental Status       Brief summary   Patient is a 75 year old, COPD, tobacco use, hypertension, chronic lower extremity edema, hyperlipidemia, lives at home.  Family brought him to ED due to what appeared to be a chronic decline in his overall condition with confusion.  Patient was admitted for acute metabolic encephalopathy, associated acute kidney injury, rhabdomyolysis.  He was placed on aggressive IV fluids per nephrology.  Palliative medicine was also consulted.   Assessment & Plan    Principal Problem: Acute Respiratory failure with hypoxia, ?bacteremia -1/4 blood cultures with gram-positive cocci, likely contaminant, pending sensitivities.  Vancomycin was discontinued - Family requesting short-term rehab however given his prognosis and decline, may not tolerate rehab.  At baseline, bedbound has been declining, underlying dementia, Parkinson's disease. -SLP evaluation confirms aspiration risk, placed on dysphagia 3 diet  -Continue Augmentin, nebs, prednisone 40 mg daily for 3 days, wheezing better today   Active Problems: Acute metabolic encephalopathy, superimposed on dementia -TSH normal -Agitation and sundowning has improved since Seroquel was started (started 11/17) 25 mg at 8 PM daily, weaned xanax was weaned to 0.5mg  qhs - this morning at the time of my examination patient was alert and oriented, watching TV, subsequently notified by RN due to mental status  change. Decrease Seroquel to 12.5 mg nightly, decrease Neurontin 100 mg daily (was 100 mg twice daily), will obtain ammonia level, labs, CT head  Acute kidney injury, multifactorial exacerbated by rhabdomyolysis, NAGMA -Creatinine as high as 8.4 on 07/03/2021, now improved to 1.2 -Nephrology was consulted, not a candidate for hemodialysis, overall poor prognosis, nephrology signed off on 11/14 -Patient was placed on IV fluid hydration, creatinine improved, hence discontinued -Continue sodium bicarb p.o.  Hypokalemia -Potassium was replaced on 11/17   Transaminitis secondary to rhabdomyolysis -Continues to improve    Parkinson's disease ([place]), dementia -Continue Sinemet  Hypertension -BP still elevated, increase Norvasc to 10 mg daily  COPD -Lung sounds much clear, continue nebs   Bladder mass -Appears to have been present on CT imaging from 2019 and unchanged, outpatient follow-up with urology  CVA generalized debility, bedbound severe -Overall global weakness, per nursing staff able to move with [facility] lift.  Family pursuing SNF/STR   Pressure Injury Documentation: Pressure Injury 06/28/21 Heel Left Deep Tissue Pressure Injury - Purple or maroon localized area of discolored intact skin or blood-filled blister due to damage of underlying soft tissue from pressure and/or shear. (Active)  06/28/21 0800  Location: Heel  Location Orientation: Left  Staging: Deep Tissue Pressure Injury - Purple or maroon  localized area of discolored intact skin or blood-filled blister due to damage of underlying soft tissue from pressure and/or shear.  Wound Description (Comments):   Present on Admission: Yes     Pressure Injury 06/28/21 Buttocks Left Unstageable - Full thickness tissue loss in which the base of the injury is covered by slough (yellow, tan, gray, green or brown) and/or eschar (tan, brown or black) in the wound bed. Wound had black/gray thick cove (Active)  06/28/21 0800  Location:  Buttocks  Location Orientation: Left  Staging: Unstageable - Full thickness tissue loss in which the base of the injury is covered by slough (yellow, tan, gray, green or brown) and/or eschar (tan, brown or black) in the wound bed.  Wound Description (Comments): Wound had black/gray thick covering, no drainage  Present on Admission: Yes     Pressure Injury 07/02/21 Rectum Mid Stage 2 -  Partial thickness loss of dermis presenting as a shallow open injury with a red, pink wound bed without slough. skin red, open, small amount of drainage (Active)  07/02/21 0845  Location: Rectum  Location Orientation: Mid  Staging: Stage 2 -  Partial thickness loss of dermis presenting as a shallow open injury with a red, pink wound bed without slough.  Wound Description (Comments): skin red, open, small amount of drainage  Present on Admission:     Code Status: DNR/DNI DVT Prophylaxis:  heparin injection 5,000 Units Start: 06/27/21 2200   Level of Care: Level of care: Med-Surg Family Communication: Discussed in detail and updated patient's son at the bedside on 11/17   Disposition Plan:     Status is: Inpatient  Remains inpatient appropriate because: Very debilitated, acute metabolic encephalopathy overall poor prognosis, family requesting SNF  Time Spent in minutes 47mins  Procedures:  None  Consultants:   Palliative medicine Nephrology  Antimicrobials:   Anti-infectives (From admission, onward)    Start     Dose/Rate Route Frequency Ordered Stop   07/13/21 1115  amoxicillin-clavulanate (AUGMENTIN) 875-125 MG per tablet 1 tablet        1 tablet Oral Every 12 hours 07/13/21 1025     07/11/21 1600  vancomycin (VANCOREADY) IVPB 1000 mg/200 mL  Status:  Discontinued        1,000 mg 200 mL/hr over 60 Minutes Intravenous Every 24 hours 07/10/21 1545 07/11/21 1029   07/11/21 1400  Ampicillin-Sulbactam (UNASYN) 3 g in sodium chloride 0.9 % 100 mL IVPB  Status:  Discontinued        3 g 200  mL/hr over 30 Minutes Intravenous Every 6 hours 07/11/21 1029 07/13/21 1025   07/10/21 1630  vancomycin (VANCOREADY) IVPB 1750 mg/350 mL        1,750 mg 175 mL/hr over 120 Minutes Intravenous  Once 07/10/21 1544 07/10/21 2035   07/09/21 1230  piperacillin-tazobactam (ZOSYN) IVPB 3.375 g  Status:  Discontinued        3.375 g 12.5 mL/hr over 240 Minutes Intravenous Every 8 hours 07/09/21 1142 07/11/21 1029   06/27/21 1415  ciprofloxacin (CIPRO) IVPB 400 mg        400 mg 200 mL/hr over 60 Minutes Intravenous  Once 06/27/21 1411 06/27/21 1531   06/27/21 1415  metroNIDAZOLE (FLAGYL) IVPB 500 mg        500 mg 100 mL/hr over 60 Minutes Intravenous  Once 06/27/21 1411 06/27/21 1531          Medications  Scheduled Meds:  ALPRAZolam  0.5 mg Oral QHS   amLODipine  5 mg Oral Daily   amoxicillin-clavulanate  1 tablet Oral Q12H   aspirin EC  81 mg Oral Daily   carbidopa-levodopa  1 tablet Oral TID   Chlorhexidine Gluconate Cloth  6 each Topical Daily   feeding supplement  237 mL Oral QID   [START ON 07/16/2021] gabapentin  100 mg Oral Daily   heparin  5,000 Units Subcutaneous Q8H   ipratropium-albuterol  3 mL Nebulization BID   nystatin   Topical BID   predniSONE  40 mg Oral Q breakfast   QUEtiapine  12.5 mg Oral QHS   sodium bicarbonate  650 mg Oral BID   Continuous Infusions:   PRN Meds:.acetaminophen **OR** acetaminophen, albuterol, alum & mag hydroxide-simeth, haloperidol, hydrALAZINE, HYDROmorphone (DILAUDID) injection, ondansetron **OR** ondansetron (ZOFRAN) IV, oxyCODONE      Subjective:   Lucas [name] was seen and examined today.  At the time of my examination earlier this a.m., patient was alert and oriented, watching TV, no acute issues overnight.    Objective:   Vitals:   07/13/21 2233 07/14/21 0456 07/14/21 2105 07/14/21 2132  BP: 126/77 (!) 170/80  (!) 155/63  Pulse: 79 81  75  Resp: 18 16  20   Temp: 98.5 F (36.9 C) 99.2 F (37.3 C)  97.8 F (36.6 C)   TempSrc: Oral Oral  Oral  SpO2: 92% 96% 96% 98%  Weight:      Height:        Intake/Output Summary (Last 24 hours) at 07/15/2021 1415 Last data filed at 07/15/2021 0900 Gross per 24 hour  Intake 480 ml  Output --  Net 480 ml     Wt Readings from Last 3 Encounters:  06/27/21 81.2 kg  06/08/21 90.7 kg  03/04/21 90.7 kg   Physical Exam, patient was seen ~ 8AM General: Alert and oriented NAD Cardiovascular: S1 S2 clear, RRR. No pedal edema b/l Respiratory: CTAB, no wheezing Gastrointestinal: Soft, nontender, nondistended, NBS Ext: no pedal edema bilaterally   Data Reviewed:  I have personally reviewed following labs and imaging studies  Micro Results Recent Results (from the past 240 hour(s))  Culture, blood (routine x 2)     Status: Abnormal   Collection Time: 07/09/21 11:19 AM   Specimen: BLOOD LEFT HAND  Result Value Ref Range Status   Specimen Description   Final    BLOOD LEFT HAND BOTTLES DRAWN AEROBIC AND ANAEROBIC Performed at [facility], [address], [place], [place] [address]    Special Requests   Final    Blood Culture adequate volume Performed at [facility], [address] [place], [place] [address]    Culture  Setup Time   Final    GRAM POSITIVE COCCI AEROBIC BOTTLE ONLY Gram Stain Report Called to,Read Back By and Verified WithSusann Givens @ [phone] 07/10/21 by STEPTR CRITICAL RESULT CALLED TO, READ BACK BY AND VERIFIED WITH: PIA [place] RN 07/11/2021 @2259  BY JW    Culture (A)  Final    STAPHYLOCOCCUS AURICULARIS THE SIGNIFICANCE OF ISOLATING THIS ORGANISM FROM A SINGLE SET OF BLOOD CULTURES WHEN MULTIPLE SETS ARE DRAWN IS UNCERTAIN. PLEASE NOTIFY THE MICROBIOLOGY DEPARTMENT WITHIN ONE WEEK IF SPECIATION AND SENSITIVITIES ARE REQUIRED. Performed at [facility], [place] [address] [place], [place] [address]    Report Status 07/14/2021 FINAL  Final  Blood Culture ID Panel (Reflexed)     Status: Abnormal   Collection Time: 07/09/21 11:19 AM   Result Value Ref Range Status   Enterococcus faecalis  NOT DETECTED NOT DETECTED Final   Enterococcus Faecium NOT DETECTED NOT DETECTED Final   Listeria monocytogenes NOT DETECTED NOT DETECTED Final   Staphylococcus species DETECTED (A) NOT DETECTED Final    Comment: CRITICAL RESULT CALLED TO, READ BACK BY AND VERIFIED WITH: PIA [place] RN 07/11/2021 @2259  BY JW    Staphylococcus aureus (BCID) NOT DETECTED NOT DETECTED Final   Staphylococcus epidermidis NOT DETECTED NOT DETECTED Final   Staphylococcus lugdunensis NOT DETECTED NOT DETECTED Final   Streptococcus species NOT DETECTED NOT DETECTED Final   Streptococcus agalactiae NOT DETECTED NOT DETECTED Final   Streptococcus pneumoniae NOT DETECTED NOT DETECTED Final   Streptococcus pyogenes NOT DETECTED NOT DETECTED Final   A.calcoaceticus-baumannii NOT DETECTED NOT DETECTED Final   Bacteroides fragilis NOT DETECTED NOT DETECTED Final   Enterobacterales NOT DETECTED NOT DETECTED Final   Enterobacter cloacae complex NOT DETECTED NOT DETECTED Final   Escherichia coli NOT DETECTED NOT DETECTED Final   Klebsiella aerogenes NOT DETECTED NOT DETECTED Final   Klebsiella oxytoca NOT DETECTED NOT DETECTED Final   Klebsiella pneumoniae NOT DETECTED NOT DETECTED Final   Proteus species NOT DETECTED NOT DETECTED Final   Salmonella species NOT DETECTED NOT DETECTED Final   Serratia marcescens NOT DETECTED NOT DETECTED Final   Haemophilus influenzae NOT DETECTED NOT DETECTED Final   Neisseria meningitidis NOT DETECTED NOT DETECTED Final   Pseudomonas aeruginosa NOT DETECTED NOT DETECTED Final   Stenotrophomonas maltophilia NOT DETECTED NOT DETECTED Final   Candida albicans NOT DETECTED NOT DETECTED Final   Candida auris NOT DETECTED NOT DETECTED Final   Candida glabrata NOT DETECTED NOT DETECTED Final   Candida krusei NOT DETECTED NOT DETECTED Final   Candida parapsilosis NOT DETECTED NOT DETECTED Final   Candida tropicalis NOT DETECTED NOT  DETECTED Final   Cryptococcus neoformans/gattii NOT DETECTED NOT DETECTED Final    Comment: Performed at [facility], [address], [place], [place] [address]  Culture, blood (routine x 2)     Status: None   Collection Time: 07/09/21 11:31 AM   Specimen: Right Antecubital; Blood  Result Value Ref Range Status   Specimen Description   Final    RIGHT ANTECUBITAL BOTTLES DRAWN AEROBIC AND ANAEROBIC   Special Requests Blood Culture adequate volume  Final   Culture   Final    NO GROWTH 6 DAYS Performed at [facility], [address], [place], [place] [address]    Report Status 07/15/2021 FINAL  Final    Radiology Reports CT ABDOMEN PELVIS WO CONTRAST  Result Date: 06/27/2021 CLINICAL DATA:  Abdominal pain. Strong urine smell. Elevated creatinine. EXAM: CT ABDOMEN AND PELVIS WITHOUT CONTRAST TECHNIQUE: Multidetector CT imaging of the abdomen and pelvis was performed following the standard protocol without IV contrast. COMPARISON:  11/03/2017. FINDINGS: Lower chest: No acute abnormality. Hepatobiliary: No focal liver abnormality is seen. No gallstones, gallbladder wall thickening, or biliary dilatation. Pancreas: Unremarkable. No pancreatic ductal dilatation or surrounding inflammatory changes. Spleen: Normal in size without focal abnormality. Adrenals/Urinary Tract: No adrenal masses. No right kidney. Mild left renal cortical thinning. No renal stone or hydronephrosis. Normal left ureter. Dilated tubular structure extends from just below the right adrenal gland to the right posterior bladder at the level of the ureteral trigone. This dilated ureter is contiguous with a bladder filling defect, also of the same attenuation. This appearance is concerning for a bladder malignancy, but is unchanged from the 11/03/2017 exam strongly favoring a benign etiology. Bladder is minimally distended, otherwise unremarkable. Stomach/Bowel:  Normal stomach. Small bowel and colon are normal in caliber. No wall  thickening. Subtle hazy opacity lies adjacent to the proximal to mid sigmoid colon where there are also multiple diverticula. This is not evident on the prior CT. No other evidence of pericolonic inflammation. Normal appendix is visualized. Vascular/Lymphatic: Aortic atherosclerosis. No aneurysm. No enlarged lymph nodes. Reproductive: Prostate is normal in size. Other: Small fat containing left inguinal hernia.  No ascites. Musculoskeletal: No fracture or acute finding.  No bone lesion. IMPRESSION: 1. Subtle hazy opacity lies adjacent to the proximal to mid sigmoid colon, where there are multiple diverticula. This is an equivocal finding, but mild uncomplicated diverticulitis should be consider if this correlates clinically. No other evidence of an acute abnormality within the abdomen or pelvis. 2. Dilated tubular structure that is contiguous with a mass projecting within the bladder. There is no visualized right kidney, presumably congenitally absent. The dilated tubular structure is likely the right gonadal vein. However, the course parallels the expected course of a right ureter. Both the bladder mass and the dilated tubular structure are stable compared to the 11/03/2017 CT. 3. Aortic atherosclerosis. Electronically Signed   By: Lajean Manes M.D.   On: 06/27/2021 13:49   CT Head Wo Contrast  Result Date: 06/27/2021 CLINICAL DATA:  Altered mental status. EXAM: CT HEAD WITHOUT CONTRAST TECHNIQUE: Contiguous axial images were obtained from the base of the skull through the vertex without intravenous contrast. COMPARISON:  December 21, 2019. FINDINGS: Brain: Mild chronic ischemic white matter disease is noted. Stable mild ventricular prominence is noted which may be due to mild cortical atrophy. No mass effect or midline shift is noted. There is no evidence of mass lesion, hemorrhage or acute infarction. Vascular: No hyperdense vessel or unexpected calcification. Skull: Normal. Negative for fracture or focal  lesion. Sinuses/Orbits: No acute finding. Other: None. IMPRESSION: No acute intracranial abnormality seen. Stable mild ventricular dilatation as described above. Electronically Signed   By: Marijo Conception M.D.   On: 06/27/2021 13:26   US SCROTUM  Result Date: 07/05/2021 CLINICAL DATA:  Edema EXAM: ULTRASOUND OF SCROTUM TECHNIQUE: Complete ultrasound examination of the testicles, epididymis, and other scrotal structures was performed. COMPARISON:  None. FINDINGS: Right testicle Measurements: 4.5 x 2.9 x 2.2 cm. No mass or microlithiasis visualized. Left testicle Measurements: 4 x 2.7 x 3 cm. No mass or microlithiasis visualized. Right epididymis:  Normal size.  7 mm epididymal head cyst. Left epididymis:  Normal in size and appearance. Hydrocele: Moderate right hydrocele. Moderate to large left hydrocele. Varicocele:  None visualized. Diffuse scrotal skin thickening and subcutaneous edema with no focal collection identified. IMPRESSION: 1. No testicular mass identified. 2. Bilateral moderate to large hydroceles. 3. Diffuse scrotal skin thickening and subcutaneous edema with no focal collection identified. 4. Right epididymal head cyst. Electronically Signed   By: Ofilia Neas M.D.   On: 07/05/2021 11:56   DG Chest Port 1 View  Result Date: 07/13/2021 CLINICAL DATA:  Short of breath.  History COPD EXAM: PORTABLE CHEST 1 VIEW COMPARISON:  07/09/2021 FINDINGS: Heart size upper normal. Normal vascularity. Interval improvement in bibasilar atelectasis. Improved lung volume compared to the prior study. IMPRESSION: Improved lung volume and bibasilar atelectasis compared with the prior study. No new finding. Electronically Signed   By: Franchot Gallo M.D.   On: 07/13/2021 08:45   DG Chest Port 1 View  Result Date: 07/09/2021 CLINICAL DATA:  75 year old male with shortness of breath. EXAM: PORTABLE CHEST - 1 VIEW COMPARISON:  07/07/2021 FINDINGS: The mediastinal contours are within normal limits. Unchanged  mild cardiomegaly. Low lung volumes. Bibasilar subsegmental atelectasis, slightly more conspicuous than comparison. No pleural effusion or pneumothorax. No acute osseous abnormality. IMPRESSION: Low lung volumes bibasilar subsegmental atelectasis. Electronically Signed   By: Ruthann Cancer M.D.   On: 07/09/2021 10:10   DG CHEST PORT 1 VIEW  Result Date: 07/07/2021 CLINICAL DATA:  Fluid overload EXAM: PORTABLE CHEST 1 VIEW COMPARISON:  07/03/2021, 06/27/2021, 12/21/2019 FINDINGS: Low lung volumes. Borderline cardiomegaly. Mild bronchovascular crowding due to low lung volume. No pleural effusion or pneumothorax. IMPRESSION: Hypoventilatory changes with borderline cardiomegaly Electronically Signed   By: Donavan Foil M.D.   On: 07/07/2021 23:09   DG CHEST PORT 1 VIEW  Result Date: 07/03/2021 CLINICAL DATA:  Shortness of breath EXAM: PORTABLE CHEST 1 VIEW COMPARISON:  Chest x-ray 06/27/2021 FINDINGS: Heart size is upper normal. Mediastinum appears stable. No focal consolidation identified. No significant pleural effusion. No pneumothorax. IMPRESSION: No acute intrathoracic process identified. Electronically Signed   By: Ofilia Neas M.D.   On: 07/03/2021 12:43   DG Chest Port 1 View  Result Date: 06/27/2021 CLINICAL DATA:  Altered mental status in a 75 year old male. EXAM: PORTABLE CHEST 1 VIEW COMPARISON:  June 09, 2021. FINDINGS: EKG leads project over the chest. Trachea midline. Cardiomediastinal contours and hilar structures stable. Linear opacity present over the LEFT hemidiaphragm. No visible pneumothorax. No sign of pleural effusion. On limited assessment no acute skeletal process. IMPRESSION: Linear opacity over the LEFT hemidiaphragm may represent atelectasis or scarring. No acute cardiopulmonary process. Electronically Signed   By: Zetta Bills M.D.   On: 06/27/2021 12:10   DG Swallowing Func-Speech Pathology  Result Date: 07/14/2021 Table formatting from the original result was not  included. Objective Swallowing Evaluation: Type of Study: MBS-Modified Barium Swallow Study  Patient Details Name: LINDON KIEL MRN: [id number] Date of Birth: 1946/01/19 Today's Date: 07/14/2021 Time: SLP Start Time (ACUTE ONLY): 1045 -SLP Stop Time (ACUTE ONLY): 1105 SLP Time Calculation (min) (ACUTE ONLY): 20 min Past Medical History: Past Medical History: Diagnosis Date  Acute CVA (cerebrovascular accident) ([place]) 06/06/2015  Acute ischemic stroke ([place]) 01/19/2015  Left midbrain/thalamus.  AKI (acute kidney injury) ([place]) 03/27/2020  Alcohol use   Anxiety   Cataract   Cerebral ventriculomegaly 01/20/2015  COPD (chronic obstructive pulmonary disease) ([facility])   Depression   Depression with anxiety   Hyperlipidemia   Hypertension   Neuromuscular disorder ([facility])   Parkinson's disease ([place])   Substance abuse ([place])   Tobacco abuse  Past Surgical History: Past Surgical History: Procedure Laterality Date  APPENDECTOMY    EYE SURGERY    SHOULDER SURGERY   HPI: LAURO MANLOVE is a 75 y.o. male with medical history significant of Parkinson's disease, prior CVA, COPD, tobacco use, hypertension, chronic lower extremity edema, hyperlipidemia, normally lives at home.  Family had brought him to ED due to what appears to be a chronic decline in his overall condition with confusion.  Patient was admitted for acute metabolic encephalopathy and associated acute kidney injury that appears to be due to rhabdomyolysis.  Patient continues to require aggressive IV fluids per nephrology.  Continue to monitor creatinine trend and follow for outcomes.  Nephrology and palliative care following. BSE completed and recommending MBS to r/o aspiration and determine safest diet.  Subjective: Pt cooperative throughout MBSS with low vocal intensity noted during conversation and decreased overall intelligibility.  Recommendations for follow up therapy are one component of a multi-disciplinary discharge  planning process, led by the attending physician.   Recommendations may be updated based on patient status, additional functional criteria and insurance authorization. Assessment / Plan / Recommendation Clinical Impressions 07/14/2021 Clinical Impression Pt presents with mild oropharyngeal dysphagia characterized by mild oral containment with min disorganized swallow during larger volumes/regular consistencies, but eventually cleared oral cavity.  Pt with an overall delay in the initation of the swallow with all consistencies to the level of the valleculae.  This affected larger volumes of liquids primarily as thin liquids penetrated the laryngeal vestibule d/t decreased laryngeal closure, but did not result in aspiration.  Pt is at risk for aspiration with thin via larger volumes d/t impulsivity during meals.  If pt utilizes small sips, he reduces his risk for aspiration.  During transition of barium tablet during the study, pt required a repetitive swallow/liquid wash to clear vallecular space.  Recommend continuing Dysphagia 3/thin liquids with SMALL SIPS and medications given in puree with FULL supervision during meals/snacks.  ST will continue to f/u in acute setting for aspiration/swallowing precaution education and diet tolerance. SLP Visit Diagnosis Dysphagia, oropharyngeal phase (R13.12) Attention and concentration deficit following -- Frontal lobe and executive function deficit following -- Impact on safety and function Mild aspiration risk;Moderate aspiration risk   Treatment Recommendations 07/14/2021 Treatment Recommendations Therapy as outlined in treatment plan below   Prognosis 07/14/2021 Prognosis for Safe Diet Advancement Good Barriers to Reach Goals Behavior Barriers/Prognosis Comment -- Diet Recommendations 07/14/2021 SLP Diet Recommendations Dysphagia 3 (Mech soft) solids;Thin liquid;Other (Comment) Liquid Administration via Cup;Straw;Other (Comment) Medication Administration Whole meds with puree Compensations Slow rate;Small sips/bites;Minimize  environmental distractions;Effortful swallow;Multiple dry swallows after each bite/sip Postural Changes Seated upright at 90 degrees   Other Recommendations 07/14/2021 Recommended Consults -- Oral Care Recommendations Oral care BID Other Recommendations -- Follow Up Recommendations Skilled nursing-short term rehab (<3 hours/day) Assistance recommended at discharge Intermittent Supervision/Assistance Functional Status Assessment Patient has had a recent decline in their functional status and demonstrates the ability to make significant improvements in function in a reasonable and predictable amount of time. Frequency and Duration  07/14/2021 Speech Therapy Frequency (ACUTE ONLY) min 2x/week Treatment Duration 1 week   Oral Phase 07/14/2021 Oral Phase WFL Oral - Pudding Teaspoon -- Oral - Pudding Cup -- Oral - Honey Teaspoon -- Oral - Honey Cup -- Oral - Nectar Teaspoon -- Oral - Nectar Cup -- Oral - Nectar Straw -- Oral - Thin Teaspoon -- Oral - Thin Cup -- Oral - Thin Straw -- Oral - Puree -- Oral - Mech Soft -- Oral - Regular -- Oral - Multi-Consistency -- Oral - Pill -- Oral Phase - Comment --  Pharyngeal Phase 07/14/2021 Pharyngeal Phase Impaired Pharyngeal- Pudding Teaspoon NT Pharyngeal -- Pharyngeal- Pudding Cup NT Pharyngeal -- Pharyngeal- Honey Teaspoon -- Pharyngeal -- Pharyngeal- Honey Cup -- Pharyngeal -- Pharyngeal- Nectar Teaspoon -- Pharyngeal -- Pharyngeal- Nectar Cup -- Pharyngeal -- Pharyngeal- Nectar Straw -- Pharyngeal -- Pharyngeal- Thin Teaspoon NT Pharyngeal -- Pharyngeal- Thin Cup Penetration/Aspiration during swallow;Delayed swallow initiation-vallecula;Reduced airway/laryngeal closure Pharyngeal -- Pharyngeal- Thin Straw Delayed swallow initiation-vallecula;Reduced airway/laryngeal closure;Penetration/Aspiration during swallow Pharyngeal Material enters airway, remains ABOVE vocal cords then ejected out Pharyngeal- Puree Delayed swallow initiation-vallecula Pharyngeal Material does not enter  airway Pharyngeal- Mechanical Soft Delayed swallow initiation-vallecula Pharyngeal Material does not enter airway Pharyngeal- Regular Delayed swallow initiation-vallecula Pharyngeal Material does not enter airway Pharyngeal- Multi-consistency NT Pharyngeal -- Pharyngeal- Pill Delayed swallow initiation-vallecula Pharyngeal Material does not enter airway Pharyngeal Comment Delay in the initiation  of the swallow to the level of the valleculae with penetration with larger volumes of liquids noted  Cervical Esophageal Phase  07/14/2021 Cervical Esophageal Phase WFL Pudding Teaspoon -- Pudding Cup -- Honey Teaspoon -- Honey Cup -- Nectar Teaspoon -- Nectar Cup -- Nectar Straw -- Thin Teaspoon -- Thin Cup -- Thin Straw -- Puree -- Mechanical Soft -- Regular -- Multi-consistency -- Pill -- Cervical Esophageal Comment -- Elvina Sidle, M.S., CCC-SLP 07/14/2021, 3:18 PM                      Lab Data:  CBC: Recent Labs  Lab 07/09/21 0438 07/10/21 0345 07/13/21 0615  WBC 14.8* 11.6* 10.5  HGB 10.2* 9.3* 9.7*  HCT 30.4* 29.1* 30.8*  MCV 98.1 101.7* 99.7  PLT 316 290 500*   Basic Metabolic Panel: Recent Labs  Lab 07/09/21 0438 07/10/21 0345 07/11/21 0507 07/12/21 0505 07/13/21 0615  NA 142 143 136  --  144  K 3.3* 3.2* 3.0*  --  2.8*  CL 106 111 105  --  112*  CO2 24 23 23   --  20*  GLUCOSE 120* 106* 112*  --  77  BUN 62* 49* 35*  --  27*  CREATININE 3.00* 2.32* 1.71*  --  1.27*  CALCIUM 7.9* 7.7* 8.0*  --  8.5*  MG 1.7 1.8 1.7 2.2  --    GFR: Estimated Creatinine Clearance: 50.1 mL/min (A) (by C-G formula based on SCr of 1.27 mg/dL (H)). Liver Function Tests: No results for input(s): AST, ALT, ALKPHOS, BILITOT, PROT, ALBUMIN in the last 168 hours.  No results for input(s): LIPASE, AMYLASE in the last 168 hours. No results for input(s): AMMONIA in the last 168 hours. Coagulation Profile: No results for input(s): INR, PROTIME in the last 168 hours. Cardiac Enzymes: Recent Labs  Lab  07/09/21 0438 07/10/21 0345 07/11/21 0507 07/12/21 0505  CKTOTAL 1,848* 1,231* 822* 703*   BNP (last 3 results) No results for input(s): PROBNP in the last 8760 hours. HbA1C: No results for input(s): HGBA1C in the last 72 hours. CBG: Recent Labs  Lab 07/15/21 1343  GLUCAP 124*   Lipid Profile: No results for input(s): CHOL, HDL, LDLCALC, TRIG, CHOLHDL, LDLDIRECT in the last 72 hours. Thyroid Function Tests: No results for input(s): TSH, T4TOTAL, FREET4, T3FREE, THYROIDAB in the last 72 hours. Anemia Panel: No results for input(s): VITAMINB12, FOLATE, FERRITIN, TIBC, IRON, RETICCTPCT in the last 72 hours. Urine analysis:    Component Value Date/Time   COLORURINE STRAW (A) 07/09/2021 1118   APPEARANCEUR CLEAR 07/09/2021 1118   LABSPEC 1.008 07/09/2021 1118   PHURINE 8.0 07/09/2021 1118   GLUCOSEU NEGATIVE 07/09/2021 1118   HGBUR MODERATE (A) 07/09/2021 1118   BILIRUBINUR NEGATIVE 07/09/2021 1118   [place] 07/09/2021 1118   PROTEINUR NEGATIVE 07/09/2021 1118   UROBILINOGEN 0.2 06/05/2015 1821   NITRITE NEGATIVE 07/09/2021 1118   LEUKOCYTESUR NEGATIVE 07/09/2021 1118     Lakeshia Dohner M.D. [facility] 07/15/2021, 2:15 PM  Available via Epic secure chat 7am-7pm After 7 pm, please refer to night coverage provider listed on amion.

## 2021-07-15 NOTE — Progress Notes (Signed)
RT responded to overhead rapid response page. Patient somewhat lethargic and having chest pain. Breathing normal at this time. Patient beginning to come around, asking for water. RT not needed at this time.

## 2021-07-16 DIAGNOSIS — J9602 Acute respiratory failure with hypercapnia: Secondary | ICD-10-CM | POA: Diagnosis not present

## 2021-07-16 DIAGNOSIS — N179 Acute kidney failure, unspecified: Secondary | ICD-10-CM | POA: Diagnosis not present

## 2021-07-16 DIAGNOSIS — N170 Acute kidney failure with tubular necrosis: Secondary | ICD-10-CM | POA: Diagnosis not present

## 2021-07-16 DIAGNOSIS — G9341 Metabolic encephalopathy: Secondary | ICD-10-CM | POA: Diagnosis not present

## 2021-07-16 MED ORDER — DIPHENHYDRAMINE-ZINC ACETATE 2-0.1 % EX CREA
TOPICAL_CREAM | Freq: Three times a day (TID) | CUTANEOUS | Status: DC | PRN
  Filled 2021-07-16: qty 28

## 2021-07-16 MED ORDER — IPRATROPIUM-ALBUTEROL 0.5-2.5 (3) MG/3ML IN SOLN
3.0000 mL | Freq: Three times a day (TID) | RESPIRATORY_TRACT | Status: DC
  Administered 2021-07-16 – 2021-07-18 (×7): 3 mL via RESPIRATORY_TRACT
  Filled 2021-07-16 (×7): qty 3

## 2021-07-16 NOTE — Progress Notes (Signed)
[facility]                                                                              Patient Demographics  Lucas [name], is a 75 y.o. male, DOB - 01-Jun-1946, GUY:[id number]  Admit date - 06/27/2021   Admitting Physician No admitting provider for patient encounter.  Outpatient Primary MD for the patient is Zhou-Talbert, [name], MD  Outpatient specialists:   LOS - 18  days   Medical records reviewed and are as summarized below:    Chief Complaint  Patient presents with   Altered Mental Status       Brief summary   Patient is a 75 year old, COPD, tobacco use, hypertension, chronic lower extremity edema, hyperlipidemia, lives at home.  Family brought him to ED due to what appeared to be a chronic decline in his overall condition with confusion.  Patient was admitted for acute metabolic encephalopathy, associated acute kidney injury, rhabdomyolysis.  He was placed on aggressive IV fluids per nephrology.  Palliative medicine was also consulted.   Assessment & Plan    Principal Problem: Acute Respiratory failure with hypoxia, ?bacteremia -1/4 blood cultures with gram-positive cocci, likely contaminant, pending sensitivities.  Vancomycin was discontinued - Family requesting short-term rehab however given his prognosis and decline, may not tolerate rehab.  At baseline, bedbound has been declining, underlying dementia, Parkinson's disease. -SLP evaluation confirms aspiration risk, placed on dysphagia 3 diet     Active Problems: Acute metabolic encephalopathy, superimposed on dementia -TSH normal -Alert and oriented, appears close to his baseline. -Continue Seroquel to 12.5 mg nightly, decrease Neurontin 100 mg daily (was 100 mg twice daily),  -CT head negative for any stroke or acute intracranial process.  Ammonia level 18  Acute kidney injury, multifactorial exacerbated by rhabdomyolysis, NAGMA -Creatinine as high as 8.4 on 07/03/2021, now improved to  1.1 -Nephrology was consulted, not a candidate for hemodialysis, overall poor prognosis, nephrology signed off on 11/14 -Patient was placed on IV fluid hydration, creatinine improved, hence discontinued -CO2 improved  Hypokalemia -Potassium was replaced on 11/17   Transaminitis secondary to rhabdomyolysis -Resolved    Parkinson's disease ([place]), dementia -Continue Sinemet  Hypertension -BP still elevated, increase Norvasc to 10 mg daily  COPD -Lung sounds much clear, continue nebs   Bladder mass -Appears to have been present on CT imaging from 2019 and unchanged, outpatient follow-up with urology  CVA generalized debility, bedbound severe -Overall global weakness, per nursing staff able to move with [facility] lift.  Family pursuing SNF/STR   Pressure Injury Documentation: Pressure Injury 06/28/21 Heel Left Deep Tissue Pressure Injury - Purple or maroon localized area of discolored intact skin or blood-filled blister due to damage of underlying soft tissue from pressure and/or shear. (Active)  06/28/21 0800  Location: Heel  Location Orientation: Left  Staging: Deep Tissue Pressure Injury - Purple or maroon localized area of discolored intact skin or blood-filled blister due to damage of underlying soft tissue from pressure and/or shear.  Wound Description (Comments):   Present on Admission: Yes     Pressure Injury 06/28/21 Buttocks Left Unstageable - Full thickness tissue loss in which the base  of the injury is covered by slough (yellow, tan, gray, green or brown) and/or eschar (tan, brown or black) in the wound bed. Wound had black/gray thick cove (Active)  06/28/21 0800  Location: Buttocks  Location Orientation: Left  Staging: Unstageable - Full thickness tissue loss in which the base of the injury is covered by slough (yellow, tan, gray, green or brown) and/or eschar (tan, brown or black) in the wound bed.  Wound Description (Comments): Wound had black/gray thick covering, no  drainage  Present on Admission: Yes     Pressure Injury 07/02/21 Rectum Mid Stage 2 -  Partial thickness loss of dermis presenting as a shallow open injury with a red, pink wound bed without slough. skin red, open, small amount of drainage (Active)  07/02/21 0845  Location: Rectum  Location Orientation: Mid  Staging: Stage 2 -  Partial thickness loss of dermis presenting as a shallow open injury with a red, pink wound bed without slough.  Wound Description (Comments): skin red, open, small amount of drainage  Present on Admission:     Code Status: DNR/DNI DVT Prophylaxis:  heparin injection 5,000 Units Start: 06/27/21 2200   Level of Care: Level of care: Med-Surg Family Communication: Discussed in detail and updated patient's son at the bedside on 11/17   Disposition Plan:     Status is: Inpatient  Remains inpatient appropriate because: Very debilitated, acute metabolic encephalopathy overall poor prognosis, family requesting SNF  Time Spent in minutes 72mins 25 minutes, awaiting skilled nursing facility  Procedures:  None  Consultants:   Palliative medicine Nephrology  Antimicrobials:   Anti-infectives (From admission, onward)    Start     Dose/Rate Route Frequency Ordered Stop   07/13/21 1115  amoxicillin-clavulanate (AUGMENTIN) 875-125 MG per tablet 1 tablet        1 tablet Oral Every 12 hours 07/13/21 1025     07/11/21 1600  vancomycin (VANCOREADY) IVPB 1000 mg/200 mL  Status:  Discontinued        1,000 mg 200 mL/hr over 60 Minutes Intravenous Every 24 hours 07/10/21 1545 07/11/21 1029   07/11/21 1400  Ampicillin-Sulbactam (UNASYN) 3 g in sodium chloride 0.9 % 100 mL IVPB  Status:  Discontinued        3 g 200 mL/hr over 30 Minutes Intravenous Every 6 hours 07/11/21 1029 07/13/21 1025   07/10/21 1630  vancomycin (VANCOREADY) IVPB 1750 mg/350 mL        1,750 mg 175 mL/hr over 120 Minutes Intravenous  Once 07/10/21 1544 07/10/21 2035   07/09/21 1230   piperacillin-tazobactam (ZOSYN) IVPB 3.375 g  Status:  Discontinued        3.375 g 12.5 mL/hr over 240 Minutes Intravenous Every 8 hours 07/09/21 1142 07/11/21 1029   06/27/21 1415  ciprofloxacin (CIPRO) IVPB 400 mg        400 mg 200 mL/hr over 60 Minutes Intravenous  Once 06/27/21 1411 06/27/21 1531   06/27/21 1415  metroNIDAZOLE (FLAGYL) IVPB 500 mg        500 mg 100 mL/hr over 60 Minutes Intravenous  Once 06/27/21 1411 06/27/21 1531          Medications  Scheduled Meds:  ALPRAZolam  0.5 mg Oral Daily   amLODipine  5 mg Oral Daily   amoxicillin-clavulanate  1 tablet Oral Q12H   aspirin EC  81 mg Oral Daily   carbidopa-levodopa  1 tablet Oral TID   Chlorhexidine Gluconate Cloth  6 each Topical Daily   feeding supplement  237 mL Oral QID   gabapentin  100 mg Oral Daily   heparin  5,000 Units Subcutaneous Q8H   ipratropium-albuterol  3 mL Nebulization BID   nystatin   Topical BID   QUEtiapine  12.5 mg Oral QHS   sodium bicarbonate  650 mg Oral BID   Continuous Infusions:   PRN Meds:.acetaminophen **OR** acetaminophen, albuterol, alum & mag hydroxide-simeth, diphenhydrAMINE-zinc acetate, haloperidol, hydrALAZINE, HYDROmorphone (DILAUDID) injection, ondansetron **OR** ondansetron (ZOFRAN) IV, oxyCODONE      Subjective:   Lucas [name] was seen and examined today.  Patient alert and oriented x3, no acute complaints.  Following commands.  No fevers or chills, nausea or vomiting, no acute events overnight  Objective:   Vitals:   07/15/21 2002 07/15/21 2032 07/16/21 0625 07/16/21 0755  BP:  (!) 146/46 (!) 153/48   Pulse:  75 77   Resp:  18 18   Temp:  98.1 F (36.7 C) 98.3 F (36.8 C)   TempSrc:   Axillary   SpO2: 97% 98% 100% 95%  Weight:      Height:        Intake/Output Summary (Last 24 hours) at 07/16/2021 1257 Last data filed at 07/16/2021 0840 Gross per 24 hour  Intake 1640 ml  Output 500 ml  Net 1140 ml     Wt Readings from Last 3 Encounters:   06/27/21 81.2 kg  06/08/21 90.7 kg  03/04/21 90.7 kg    Physical Exam General: Alert and oriented x 3, NAD Cardiovascular: S1 S2 clear, RRR.  Respiratory: Bilateral expiratory wheezing Gastrointestinal: Soft, nontender, nondistended, NBS Ext: no pedal edema bilaterally Neuro: no new deficits   Data Reviewed:  I have personally reviewed following labs and imaging studies  Micro Results Recent Results (from the past 240 hour(s))  Culture, blood (routine x 2)     Status: Abnormal   Collection Time: 07/09/21 11:19 AM   Specimen: BLOOD LEFT HAND  Result Value Ref Range Status   Specimen Description   Final    BLOOD LEFT HAND BOTTLES DRAWN AEROBIC AND ANAEROBIC Performed at [facility], [address], [place], [place] [address]    Special Requests   Final    Blood Culture adequate volume Performed at [facility], [address] [place], [place] [address]    Culture  Setup Time   Final    GRAM POSITIVE COCCI AEROBIC BOTTLE ONLY Gram Stain Report Called to,Read Back By and Verified WithSusann Givens @ 0981 07/10/21 by STEPTR CRITICAL RESULT CALLED TO, READ BACK BY AND VERIFIED WITH: PIA Bassett RN 07/11/2021 @2259  BY JW    Culture (A)  Final    STAPHYLOCOCCUS AURICULARIS THE SIGNIFICANCE OF ISOLATING THIS ORGANISM FROM A SINGLE SET OF BLOOD CULTURES WHEN MULTIPLE SETS ARE DRAWN IS UNCERTAIN. PLEASE NOTIFY THE MICROBIOLOGY DEPARTMENT WITHIN ONE WEEK IF SPECIATION AND SENSITIVITIES ARE REQUIRED. Performed at [facility], [place] [address], [place], [place] [address]    Report Status 07/14/2021 FINAL  Final  Blood Culture ID Panel (Reflexed)     Status: Abnormal   Collection Time: 07/09/21 11:19 AM  Result Value Ref Range Status   Enterococcus faecalis NOT DETECTED NOT DETECTED Final   Enterococcus Faecium NOT DETECTED NOT DETECTED Final   Listeria monocytogenes NOT DETECTED NOT DETECTED Final   Staphylococcus species DETECTED (A) NOT DETECTED Final    Comment: CRITICAL  RESULT CALLED TO, READ BACK BY AND VERIFIED WITH: PIA PARRISH RN 07/11/2021 @2259  BY Blima Rich  Staphylococcus aureus (BCID) NOT DETECTED NOT DETECTED Final   Staphylococcus epidermidis NOT DETECTED NOT DETECTED Final   Staphylococcus lugdunensis NOT DETECTED NOT DETECTED Final   Streptococcus species NOT DETECTED NOT DETECTED Final   Streptococcus agalactiae NOT DETECTED NOT DETECTED Final   Streptococcus pneumoniae NOT DETECTED NOT DETECTED Final   Streptococcus pyogenes NOT DETECTED NOT DETECTED Final   A.calcoaceticus-baumannii NOT DETECTED NOT DETECTED Final   Bacteroides fragilis NOT DETECTED NOT DETECTED Final   Enterobacterales NOT DETECTED NOT DETECTED Final   Enterobacter cloacae complex NOT DETECTED NOT DETECTED Final   Escherichia coli NOT DETECTED NOT DETECTED Final   Klebsiella aerogenes NOT DETECTED NOT DETECTED Final   Klebsiella oxytoca NOT DETECTED NOT DETECTED Final   Klebsiella pneumoniae NOT DETECTED NOT DETECTED Final   Proteus species NOT DETECTED NOT DETECTED Final   Salmonella species NOT DETECTED NOT DETECTED Final   Serratia marcescens NOT DETECTED NOT DETECTED Final   Haemophilus influenzae NOT DETECTED NOT DETECTED Final   Neisseria meningitidis NOT DETECTED NOT DETECTED Final   Pseudomonas aeruginosa NOT DETECTED NOT DETECTED Final   Stenotrophomonas maltophilia NOT DETECTED NOT DETECTED Final   Candida albicans NOT DETECTED NOT DETECTED Final   Candida auris NOT DETECTED NOT DETECTED Final   Candida glabrata NOT DETECTED NOT DETECTED Final   Candida krusei NOT DETECTED NOT DETECTED Final   Candida parapsilosis NOT DETECTED NOT DETECTED Final   Candida tropicalis NOT DETECTED NOT DETECTED Final   Cryptococcus neoformans/gattii NOT DETECTED NOT DETECTED Final    Comment: Performed at [facility], [address] [place], [place] [address]  Culture, blood (routine x 2)     Status: None   Collection Time: 07/09/21 11:31 AM   Specimen: Right Antecubital;  Blood  Result Value Ref Range Status   Specimen Description   Final    RIGHT ANTECUBITAL BOTTLES DRAWN AEROBIC AND ANAEROBIC   Special Requests Blood Culture adequate volume  Final   Culture   Final    NO GROWTH 6 DAYS Performed at [facility], [address] [place], [place] [address]    Report Status 07/15/2021 FINAL  Final    Radiology Reports CT ABDOMEN PELVIS WO CONTRAST  Result Date: 06/27/2021 CLINICAL DATA:  Abdominal pain. Strong urine smell. Elevated creatinine. EXAM: CT ABDOMEN AND PELVIS WITHOUT CONTRAST TECHNIQUE: Multidetector CT imaging of the abdomen and pelvis was performed following the standard protocol without IV contrast. COMPARISON:  11/03/2017. FINDINGS: Lower chest: No acute abnormality. Hepatobiliary: No focal liver abnormality is seen. No gallstones, gallbladder wall thickening, or biliary dilatation. Pancreas: Unremarkable. No pancreatic ductal dilatation or surrounding inflammatory changes. Spleen: Normal in size without focal abnormality. Adrenals/Urinary Tract: No adrenal masses. No right kidney. Mild left renal cortical thinning. No renal stone or hydronephrosis. Normal left ureter. Dilated tubular structure extends from just below the right adrenal gland to the right posterior bladder at the level of the ureteral trigone. This dilated ureter is contiguous with a bladder filling defect, also of the same attenuation. This appearance is concerning for a bladder malignancy, but is unchanged from the 11/03/2017 exam strongly favoring a benign etiology. Bladder is minimally distended, otherwise unremarkable. Stomach/Bowel: Normal stomach. Small bowel and colon are normal in caliber. No wall thickening. Subtle hazy opacity lies adjacent to the proximal to mid sigmoid colon where there are also multiple diverticula. This is not evident on the prior CT. No other evidence of pericolonic inflammation. Normal appendix is visualized. Vascular/Lymphatic: Aortic atherosclerosis. No  aneurysm. No  enlarged lymph nodes. Reproductive: Prostate is normal in size. Other: Small fat containing left inguinal hernia.  No ascites. Musculoskeletal: No fracture or acute finding.  No bone lesion. IMPRESSION: 1. Subtle hazy opacity lies adjacent to the proximal to mid sigmoid colon, where there are multiple diverticula. This is an equivocal finding, but mild uncomplicated diverticulitis should be consider if this correlates clinically. No other evidence of an acute abnormality within the abdomen or pelvis. 2. Dilated tubular structure that is contiguous with a mass projecting within the bladder. There is no visualized right kidney, presumably congenitally absent. The dilated tubular structure is likely the right gonadal vein. However, the course parallels the expected course of a right ureter. Both the bladder mass and the dilated tubular structure are stable compared to the 11/03/2017 CT. 3. Aortic atherosclerosis. Electronically Signed   By: Lajean Manes M.D.   On: 06/27/2021 13:49   CT HEAD WO CONTRAST (5MM)  Result Date: 07/15/2021 CLINICAL DATA:  Altered mental status EXAM: CT HEAD WITHOUT CONTRAST TECHNIQUE: Contiguous axial images were obtained from the base of the skull through the vertex without intravenous contrast. COMPARISON:  06/27/2021, 12/21/2019 FINDINGS: Brain: No evidence of acute infarction, hemorrhage, extra-axial collection or mass lesion/mass effect. Unchanged prominence of the bilateral lateral and third ventricles. Mild periventricular white matter hypodensity. Vascular: No hyperdense vessel or unexpected calcification. Skull: Normal. Negative for fracture or focal lesion. Sinuses/Orbits: Chronic bilateral mucosal and bony sinus wall thickening of the maxillary and sphenoid sinuses. Partial opacification of the ethmoid air cells. Small bilateral air-fluid levels of the maxillary sinuses. Other: None. IMPRESSION: 1. No acute intracranial pathology. 2. Small-vessel white matter  disease. 3. Unchanged prominence of the bilateral lateral and third ventricles, which may be related to global cerebral volume loss but can be seen in normal pressure hydrocephalus. 4. Chronic paranasal sinus disease. Electronically Signed   By: Delanna Ahmadi M.D.   On: 07/15/2021 16:17   CT Head Wo Contrast  Result Date: 06/27/2021 CLINICAL DATA:  Altered mental status. EXAM: CT HEAD WITHOUT CONTRAST TECHNIQUE: Contiguous axial images were obtained from the base of the skull through the vertex without intravenous contrast. COMPARISON:  December 21, 2019. FINDINGS: Brain: Mild chronic ischemic white matter disease is noted. Stable mild ventricular prominence is noted which may be due to mild cortical atrophy. No mass effect or midline shift is noted. There is no evidence of mass lesion, hemorrhage or acute infarction. Vascular: No hyperdense vessel or unexpected calcification. Skull: Normal. Negative for fracture or focal lesion. Sinuses/Orbits: No acute finding. Other: None. IMPRESSION: No acute intracranial abnormality seen. Stable mild ventricular dilatation as described above. Electronically Signed   By: Marijo Conception M.D.   On: 06/27/2021 13:26   US SCROTUM  Result Date: 07/05/2021 CLINICAL DATA:  Edema EXAM: ULTRASOUND OF SCROTUM TECHNIQUE: Complete ultrasound examination of the testicles, epididymis, and other scrotal structures was performed. COMPARISON:  None. FINDINGS: Right testicle Measurements: 4.5 x 2.9 x 2.2 cm. No mass or microlithiasis visualized. Left testicle Measurements: 4 x 2.7 x 3 cm. No mass or microlithiasis visualized. Right epididymis:  Normal size.  7 mm epididymal head cyst. Left epididymis:  Normal in size and appearance. Hydrocele: Moderate right hydrocele. Moderate to large left hydrocele. Varicocele:  None visualized. Diffuse scrotal skin thickening and subcutaneous edema with no focal collection identified. IMPRESSION: 1. No testicular mass identified. 2. Bilateral moderate  to large hydroceles. 3. Diffuse scrotal skin thickening and subcutaneous edema with no focal collection identified. 4. Right  epididymal head cyst. Electronically Signed   By: Ofilia Neas M.D.   On: 07/05/2021 11:56   DG Chest Port 1 View  Result Date: 07/13/2021 CLINICAL DATA:  Short of breath.  History COPD EXAM: PORTABLE CHEST 1 VIEW COMPARISON:  07/09/2021 FINDINGS: Heart size upper normal. Normal vascularity. Interval improvement in bibasilar atelectasis. Improved lung volume compared to the prior study. IMPRESSION: Improved lung volume and bibasilar atelectasis compared with the prior study. No new finding. Electronically Signed   By: Franchot Gallo M.D.   On: 07/13/2021 08:45   DG Chest Port 1 View  Result Date: 07/09/2021 CLINICAL DATA:  75 year old male with shortness of breath. EXAM: PORTABLE CHEST - 1 VIEW COMPARISON:  07/07/2021 FINDINGS: The mediastinal contours are within normal limits. Unchanged mild cardiomegaly. Low lung volumes. Bibasilar subsegmental atelectasis, slightly more conspicuous than comparison. No pleural effusion or pneumothorax. No acute osseous abnormality. IMPRESSION: Low lung volumes bibasilar subsegmental atelectasis. Electronically Signed   By: Ruthann Cancer M.D.   On: 07/09/2021 10:10   DG CHEST PORT 1 VIEW  Result Date: 07/07/2021 CLINICAL DATA:  Fluid overload EXAM: PORTABLE CHEST 1 VIEW COMPARISON:  07/03/2021, 06/27/2021, 12/21/2019 FINDINGS: Low lung volumes. Borderline cardiomegaly. Mild bronchovascular crowding due to low lung volume. No pleural effusion or pneumothorax. IMPRESSION: Hypoventilatory changes with borderline cardiomegaly Electronically Signed   By: Donavan Foil M.D.   On: 07/07/2021 23:09   DG CHEST PORT 1 VIEW  Result Date: 07/03/2021 CLINICAL DATA:  Shortness of breath EXAM: PORTABLE CHEST 1 VIEW COMPARISON:  Chest x-ray 06/27/2021 FINDINGS: Heart size is upper normal. Mediastinum appears stable. No focal consolidation identified.  No significant pleural effusion. No pneumothorax. IMPRESSION: No acute intrathoracic process identified. Electronically Signed   By: Ofilia Neas M.D.   On: 07/03/2021 12:43   DG Chest Port 1 View  Result Date: 06/27/2021 CLINICAL DATA:  Altered mental status in a 75 year old male. EXAM: PORTABLE CHEST 1 VIEW COMPARISON:  June 09, 2021. FINDINGS: EKG leads project over the chest. Trachea midline. Cardiomediastinal contours and hilar structures stable. Linear opacity present over the LEFT hemidiaphragm. No visible pneumothorax. No sign of pleural effusion. On limited assessment no acute skeletal process. IMPRESSION: Linear opacity over the LEFT hemidiaphragm may represent atelectasis or scarring. No acute cardiopulmonary process. Electronically Signed   By: Zetta Bills M.D.   On: 06/27/2021 12:10   DG Swallowing Func-Speech Pathology  Result Date: 07/14/2021 Table formatting from the original result was not included. Objective Swallowing Evaluation: Type of Study: MBS-Modified Barium Swallow Study  Patient Details Name: Lucas [name] MRN: [id number] Date of Birth: [birth year] Today's Date: 07/14/2021 Time: SLP Start Time (ACUTE ONLY): 1045 -SLP Stop Time (ACUTE ONLY): 1105 SLP Time Calculation (min) (ACUTE ONLY): 20 min Past Medical History: Past Medical History: Diagnosis Date  Acute CVA (cerebrovascular accident) ([place]) 06/06/2015  Acute ischemic stroke ([place]) 01/19/2015  Left midbrain/thalamus.  AKI (acute kidney injury) ([place]) 03/27/2020  Alcohol use   Anxiety   Cataract   Cerebral ventriculomegaly 01/20/2015  COPD (chronic obstructive pulmonary disease) ([facility])   Depression   Depression with anxiety   Hyperlipidemia   Hypertension   Neuromuscular disorder ([facility])   Parkinson's disease ([place])   Substance abuse ([place])   Tobacco abuse  Past Surgical History: Past Surgical History: Procedure Laterality Date  APPENDECTOMY    EYE SURGERY    SHOULDER SURGERY   HPI: ANURAG SCARFO is a 75 y.o. male with medical  history significant of Parkinson's disease, prior CVA, COPD,  tobacco use, hypertension, chronic lower extremity edema, hyperlipidemia, normally lives at home.  Family had brought him to ED due to what appears to be a chronic decline in his overall condition with confusion.  Patient was admitted for acute metabolic encephalopathy and associated acute kidney injury that appears to be due to rhabdomyolysis.  Patient continues to require aggressive IV fluids per nephrology.  Continue to monitor creatinine trend and follow for outcomes.  Nephrology and palliative care following. BSE completed and recommending MBS to r/o aspiration and determine safest diet.  Subjective: Pt cooperative throughout MBSS with low vocal intensity noted during conversation and decreased overall intelligibility.  Recommendations for follow up therapy are one component of a multi-disciplinary discharge planning process, led by the attending physician.  Recommendations may be updated based on patient status, additional functional criteria and insurance authorization. Assessment / Plan / Recommendation Clinical Impressions 07/14/2021 Clinical Impression Pt presents with mild oropharyngeal dysphagia characterized by mild oral containment with min disorganized swallow during larger volumes/regular consistencies, but eventually cleared oral cavity.  Pt with an overall delay in the initation of the swallow with all consistencies to the level of the valleculae.  This affected larger volumes of liquids primarily as thin liquids penetrated the laryngeal vestibule d/t decreased laryngeal closure, but did not result in aspiration.  Pt is at risk for aspiration with thin via larger volumes d/t impulsivity during meals.  If pt utilizes small sips, he reduces his risk for aspiration.  During transition of barium tablet during the study, pt required a repetitive swallow/liquid wash to clear vallecular space.  Recommend continuing Dysphagia 3/thin liquids with  SMALL SIPS and medications given in puree with FULL supervision during meals/snacks.  ST will continue to f/u in acute setting for aspiration/swallowing precaution education and diet tolerance. SLP Visit Diagnosis Dysphagia, oropharyngeal phase (R13.12) Attention and concentration deficit following -- Frontal lobe and executive function deficit following -- Impact on safety and function Mild aspiration risk;Moderate aspiration risk   Treatment Recommendations 07/14/2021 Treatment Recommendations Therapy as outlined in treatment plan below   Prognosis 07/14/2021 Prognosis for Safe Diet Advancement Good Barriers to Reach Goals Behavior Barriers/Prognosis Comment -- Diet Recommendations 07/14/2021 SLP Diet Recommendations Dysphagia 3 (Mech soft) solids;Thin liquid;Other (Comment) Liquid Administration via Cup;Straw;Other (Comment) Medication Administration Whole meds with puree Compensations Slow rate;Small sips/bites;Minimize environmental distractions;Effortful swallow;Multiple dry swallows after each bite/sip Postural Changes Seated upright at 90 degrees   Other Recommendations 07/14/2021 Recommended Consults -- Oral Care Recommendations Oral care BID Other Recommendations -- Follow Up Recommendations Skilled nursing-short term rehab (<3 hours/day) Assistance recommended at discharge Intermittent Supervision/Assistance Functional Status Assessment Patient has had a recent decline in their functional status and demonstrates the ability to make significant improvements in function in a reasonable and predictable amount of time. Frequency and Duration  07/14/2021 Speech Therapy Frequency (ACUTE ONLY) min 2x/week Treatment Duration 1 week   Oral Phase 07/14/2021 Oral Phase WFL Oral - Pudding Teaspoon -- Oral - Pudding Cup -- Oral - Honey Teaspoon -- Oral - Honey Cup -- Oral - Nectar Teaspoon -- Oral - Nectar Cup -- Oral - Nectar Straw -- Oral - Thin Teaspoon -- Oral - Thin Cup -- Oral - Thin Straw -- Oral - Puree --  Oral - Mech Soft -- Oral - Regular -- Oral - Multi-Consistency -- Oral - Pill -- Oral Phase - Comment --  Pharyngeal Phase 07/14/2021 Pharyngeal Phase Impaired Pharyngeal- Pudding Teaspoon NT Pharyngeal -- Pharyngeal- Pudding Cup NT Pharyngeal -- Pharyngeal- Honey Teaspoon --  Pharyngeal -- Pharyngeal- Honey Cup -- Pharyngeal -- Pharyngeal- Nectar Teaspoon -- Pharyngeal -- Pharyngeal- Nectar Cup -- Pharyngeal -- Pharyngeal- Nectar Straw -- Pharyngeal -- Pharyngeal- Thin Teaspoon NT Pharyngeal -- Pharyngeal- Thin Cup Penetration/Aspiration during swallow;Delayed swallow initiation-vallecula;Reduced airway/laryngeal closure Pharyngeal -- Pharyngeal- Thin Straw Delayed swallow initiation-vallecula;Reduced airway/laryngeal closure;Penetration/Aspiration during swallow Pharyngeal Material enters airway, remains ABOVE vocal cords then ejected out Pharyngeal- Puree Delayed swallow initiation-vallecula Pharyngeal Material does not enter airway Pharyngeal- Mechanical Soft Delayed swallow initiation-vallecula Pharyngeal Material does not enter airway Pharyngeal- Regular Delayed swallow initiation-vallecula Pharyngeal Material does not enter airway Pharyngeal- Multi-consistency NT Pharyngeal -- Pharyngeal- Pill Delayed swallow initiation-vallecula Pharyngeal Material does not enter airway Pharyngeal Comment Delay in the initiation of the swallow to the level of the valleculae with penetration with larger volumes of liquids noted  Cervical Esophageal Phase  07/14/2021 Cervical Esophageal Phase WFL Pudding Teaspoon -- Pudding Cup -- Honey Teaspoon -- Honey Cup -- Nectar Teaspoon -- Nectar Cup -- Nectar Straw -- Thin Teaspoon -- Thin Cup -- Thin Straw -- Puree -- Mechanical Soft -- Regular -- Multi-consistency -- Pill -- Cervical Esophageal Comment -- Elvina Sidle, M.S., CCC-SLP 07/14/2021, 3:18 PM                      Lab Data:  CBC: Recent Labs  Lab 07/10/21 0345 07/13/21 0615  WBC 11.6* 10.5  HGB 9.3* 9.7*  HCT 29.1*  30.8*  MCV 101.7* 99.7  PLT 290 097*   Basic Metabolic Panel: Recent Labs  Lab 07/10/21 0345 07/11/21 0507 07/12/21 0505 07/13/21 0615 07/15/21 1417  NA 143 136  --  144 137  K 3.2* 3.0*  --  2.8* 4.0  CL 111 105  --  112* 105  CO2 23 23  --  20* 22  GLUCOSE 106* 112*  --  77 133*  BUN 49* 35*  --  27* 19  CREATININE 2.32* 1.71*  --  1.27* 1.10  CALCIUM 7.7* 8.0*  --  8.5* 8.5*  MG 1.8 1.7 2.2  --   --    GFR: Estimated Creatinine Clearance: 57.9 mL/min (by C-G formula based on SCr of 1.1 mg/dL). Liver Function Tests: No results for input(s): AST, ALT, ALKPHOS, BILITOT, PROT, ALBUMIN in the last 168 hours.  No results for input(s): LIPASE, AMYLASE in the last 168 hours. Recent Labs  Lab 07/15/21 1417  AMMONIA 18   Coagulation Profile: No results for input(s): INR, PROTIME in the last 168 hours. Cardiac Enzymes: Recent Labs  Lab 07/10/21 0345 07/11/21 0507 07/12/21 0505  CKTOTAL 1,231* 822* 703*   BNP (last 3 results) No results for input(s): PROBNP in the last 8760 hours. HbA1C: No results for input(s): HGBA1C in the last 72 hours. CBG: Recent Labs  Lab 07/15/21 1343  GLUCAP 124*   Lipid Profile: No results for input(s): CHOL, HDL, LDLCALC, TRIG, CHOLHDL, LDLDIRECT in the last 72 hours. Thyroid Function Tests: No results for input(s): TSH, T4TOTAL, FREET4, T3FREE, THYROIDAB in the last 72 hours. Anemia Panel: No results for input(s): VITAMINB12, FOLATE, FERRITIN, TIBC, IRON, RETICCTPCT in the last 72 hours. Urine analysis:    Component Value Date/Time   COLORURINE YELLOW 07/15/2021 1738   APPEARANCEUR CLEAR 07/15/2021 1738   LABSPEC 1.009 07/15/2021 1738   PHURINE 8.0 07/15/2021 1738   GLUCOSEU NEGATIVE 07/15/2021 1738   HGBUR NEGATIVE 07/15/2021 1738   BILIRUBINUR NEGATIVE 07/15/2021 [place] 07/15/2021 1738   PROTEINUR NEGATIVE 07/15/2021 1738   UROBILINOGEN 0.2  06/05/2015 1821   NITRITE NEGATIVE 07/15/2021 [place] 07/15/2021 1738     Cecelia Graciano M.D. [facility] 07/16/2021, 12:57 PM  Available via Epic secure chat 7am-7pm After 7 pm, please refer to night coverage provider listed on amion.

## 2021-07-17 DIAGNOSIS — N170 Acute kidney failure with tubular necrosis: Secondary | ICD-10-CM

## 2021-07-17 DIAGNOSIS — N179 Acute kidney failure, unspecified: Secondary | ICD-10-CM | POA: Diagnosis not present

## 2021-07-17 DIAGNOSIS — G9341 Metabolic encephalopathy: Secondary | ICD-10-CM | POA: Diagnosis not present

## 2021-07-17 DIAGNOSIS — N183 Chronic kidney disease, stage 3 unspecified: Secondary | ICD-10-CM

## 2021-07-17 DIAGNOSIS — J9602 Acute respiratory failure with hypercapnia: Secondary | ICD-10-CM | POA: Diagnosis not present

## 2021-07-17 MED ORDER — SODIUM BICARBONATE 650 MG PO TABS: 650.0000 mg | ORAL_TABLET | Freq: Every day | ORAL | Status: AC

## 2021-07-17 MED ORDER — GABAPENTIN 100 MG PO CAPS: 100.0000 mg | ORAL_CAPSULE | Freq: Every day | ORAL | Status: AC

## 2021-07-17 MED ORDER — QUETIAPINE FUMARATE 25 MG PO TABS: 12.5000 mg | ORAL_TABLET | Freq: Every day | ORAL | 0 refills | Status: AC

## 2021-07-17 MED ORDER — ALPRAZOLAM 0.5 MG PO TABS
0.5000 mg | ORAL_TABLET | Freq: Every morning | ORAL | 0 refills | Status: AC
Start: 2021-07-17 — End: ?

## 2021-07-17 MED ORDER — AMLODIPINE BESYLATE 5 MG PO TABS
10.0000 mg | ORAL_TABLET | Freq: Every day | ORAL | Status: DC
  Administered 2021-07-17 – 2021-07-21 (×5): 10 mg via ORAL
  Filled 2021-07-17 (×5): qty 2

## 2021-07-17 MED ORDER — AMLODIPINE BESYLATE 10 MG PO TABS: 10.0000 mg | ORAL_TABLET | Freq: Every day | ORAL | Status: AC

## 2021-07-17 MED ORDER — OXYCODONE HCL 5 MG PO TABS
5.0000 mg | ORAL_TABLET | Freq: Four times a day (QID) | ORAL | 0 refills | Status: AC | PRN
Start: 2021-07-17 — End: ?

## 2021-07-17 MED ORDER — AMOXICILLIN-POT CLAVULANATE 875-125 MG PO TABS: 1.0000 | ORAL_TABLET | Freq: Two times a day (BID) | ORAL | Status: DC

## 2021-07-17 MED ORDER — NYSTATIN 100000 UNIT/GM EX POWD: Freq: Two times a day (BID) | CUTANEOUS | 0 refills | Status: AC

## 2021-07-17 MED ORDER — HALOPERIDOL 1 MG PO TABS: 1.0000 mg | ORAL_TABLET | Freq: Three times a day (TID) | ORAL | 0 refills | Status: AC | PRN

## 2021-07-17 NOTE — Progress Notes (Signed)
Palliative: Chart review completed.  Lucas [name] is to discharge to short-term rehab today. Conference with transition of care team related to disposition.  Plan: Continue to treat the treatable but no CPR or intubation.  [place] form on chart.  Short-term rehab.  Patient and family agreeable to outpatient palliative services.  Provider choice based on location of short-term rehab.  No charge [name], NP Palliative medicine team Team phone [phone] Greater than 50% of this time was spent counseling and coordinating care related to the above assessment and plan.

## 2021-07-17 NOTE — Progress Notes (Signed)
Speech Language Pathology Treatment: Dysphagia  Patient Details Name: Lucas [name] MRN: [id number] DOB: 1945-10-07 Today's Date: 07/17/2021 Time: 9924-2683 SLP Time Calculation (min) (ACUTE ONLY): 9 min  Assessment / Plan / Recommendation Clinical Impression  Pt seen for f/u dysphagia services following MBSS completed on Friday. Pt observed with dinner meal and was being fed by nursing staff. Pt reportedly consuming mechanical soft diet and thin liquids via small sips via straw without incident. Pt needs feeder assistance and cues to take small sips. Ok to continue diet as ordered and SLP will sign off.    HPI HPI: Lucas [name] is a 75 y.o. male with medical history significant of Parkinson's disease, prior CVA, COPD, tobacco use, hypertension, chronic lower extremity edema, hyperlipidemia, normally lives at home.  Family had brought him to ED due to what appears to be a chronic decline in his overall condition with confusion.  Patient was admitted for acute metabolic encephalopathy and associated acute kidney injury that appears to be due to rhabdomyolysis.  Patient continues to require aggressive IV fluids per nephrology.  Continue to monitor creatinine trend and follow for outcomes.  Nephrology and palliative care following. BSE completed and recommending MBS to r/o aspiration and determine safest diet.      SLP Plan  Continue with current plan of care;Discharge SLP treatment due to (comment)      Recommendations for follow up therapy are one component of a multi-disciplinary discharge planning process, led by the attending physician.  Recommendations may be updated based on patient status, additional functional criteria and insurance authorization.    Recommendations  Diet recommendations: Dysphagia 3 (mechanical soft);Thin liquid Liquids provided via: Cup;Straw Medication Administration: Whole meds with puree Supervision: Staff to assist with self feeding;Full supervision/cueing  for compensatory strategies Compensations: Slow rate;Small sips/bites;Minimize environmental distractions;Effortful swallow;Multiple dry swallows after each bite/sip Postural Changes and/or Swallow Maneuvers: Seated upright 90 degrees;Upright 30-60 min after meal                Oral Care Recommendations: Oral care BID;Staff/trained caregiver to provide oral care Follow Up Recommendations: Skilled nursing-short term rehab (<3 hours/day) Assistance recommended at discharge: Intermittent Supervision/Assistance SLP Visit Diagnosis: Dysphagia, oropharyngeal phase (R13.12) Plan: Continue with current plan of care;Discharge SLP treatment due to (comment)       Thank you,  Genene Churn, [place]                [place]  07/17/2021, 5:20 PM

## 2021-07-17 NOTE — Progress Notes (Signed)
[facility]                                                                              Patient Demographics  Lucas [name], is a 75 y.o. male, DOB - [birth year], QVZ:[id number]  Admit date - 06/27/2021   Admitting Physician No admitting provider for patient encounter.  Outpatient Primary MD for the patient is Zhou-Talbert, [name], MD  Outpatient specialists:   LOS - 19  days   Medical records reviewed and are as summarized below:    Chief Complaint  Patient presents with   Altered Mental Status       Brief summary   Patient is a 75 year old, COPD, tobacco use, hypertension, chronic lower extremity edema, hyperlipidemia, lives at home.  Family brought him to ED due to what appeared to be a chronic decline in his overall condition with confusion.  Patient was admitted for acute metabolic encephalopathy, associated acute kidney injury, rhabdomyolysis.  He was placed on aggressive IV fluids per nephrology.  Palliative medicine was also consulted.   Assessment & Plan    Principal Problem: Acute Respiratory failure with hypoxia, ?bacteremia -1/4 blood cultures with gram-positive cocci, likely contaminant, pending sensitivities.  Vancomycin was discontinued - Family requesting short-term rehab however given his prognosis and decline, may not tolerate rehab.  At baseline, bedbound has been declining, underlying dementia, Parkinson's disease. -SLP evaluation confirms aspiration risk, placed on dysphagia 3 diet  -Tolerating diet, awaiting SNF.  O2 sats 96% on room air    Active Problems: Acute metabolic encephalopathy, superimposed on dementia -TSH normal -Continue Seroquel to 12.5 mg nightly, decrease Neurontin 100 mg daily (was 100 mg twice daily),  -CT head negative for any stroke or acute intracranial process.  Ammonia level 18 -Currently alert and oriented closer to his baseline.  Acute kidney injury, multifactorial exacerbated by rhabdomyolysis,  NAGMA -Creatinine as high as 8.4 on 07/03/2021, now improved to 1.1 -Nephrology was consulted, not a candidate for hemodialysis, overall poor prognosis, nephrology signed off on 11/14 -Bicarb improving, 22, DC sodium bicarb once > 25  Hypokalemia -Potassium was replaced on 11/17   Transaminitis secondary to rhabdomyolysis -Resolved    Parkinson's disease ([place]), dementia -Continue Sinemet  Hypertension -BP still elevated, increase Norvasc to 10 mg daily  COPD -Lung sounds much clear, continue nebs   Bladder mass -Appears to have been present on CT imaging from 2019 and unchanged, outpatient follow-up with urology  CVA generalized debility, bedbound severe -Overall global weakness, per nursing staff able to move with [facility] lift.  Family pursuing SNF/STR   Pressure Injury Documentation: Pressure Injury 06/28/21 Heel Left Deep Tissue Pressure Injury - Purple or maroon localized area of discolored intact skin or blood-filled blister due to damage of underlying soft tissue from pressure and/or shear. (Active)  06/28/21 0800  Location: Heel  Location Orientation: Left  Staging: Deep Tissue Pressure Injury - Purple or maroon localized area of discolored intact skin or blood-filled blister due to damage of underlying soft tissue from pressure and/or shear.  Wound Description (Comments):   Present on Admission: Yes     Pressure Injury 06/28/21 Buttocks Left Unstageable - Full  thickness tissue loss in which the base of the injury is covered by slough (yellow, tan, gray, green or brown) and/or eschar (tan, brown or black) in the wound bed. Wound had black/gray thick cove (Active)  06/28/21 0800  Location: Buttocks  Location Orientation: Left  Staging: Unstageable - Full thickness tissue loss in which the base of the injury is covered by slough (yellow, tan, gray, green or brown) and/or eschar (tan, brown or black) in the wound bed.  Wound Description (Comments): Wound had black/gray thick  covering, no drainage  Present on Admission: Yes     Pressure Injury 07/02/21 Rectum Mid Stage 2 -  Partial thickness loss of dermis presenting as a shallow open injury with a red, pink wound bed without slough. skin red, open, small amount of drainage (Active)  07/02/21 0845  Location: Rectum  Location Orientation: Mid  Staging: Stage 2 -  Partial thickness loss of dermis presenting as a shallow open injury with a red, pink wound bed without slough.  Wound Description (Comments): skin red, open, small amount of drainage  Present on Admission:     Code Status: DNR/DNI DVT Prophylaxis:  heparin injection 5,000 Units Start: 06/27/21 2200   Level of Care: Level of care: Med-Surg Family Communication: Discussed in detail and updated patient's son at the bedside on 11/17   Disposition Plan:     Status is: Inpatient  Remains inpatient appropriate because: Very debilitated, acute metabolic encephalopathy overall poor prognosis, family requesting SNF  Medically clear, awaiting skilled nursing facility  Time Spent in minutes 15 minutes  Procedures:  None  Consultants:   Palliative medicine Nephrology  Antimicrobials:   Anti-infectives (From admission, onward)    Start     Dose/Rate Route Frequency Ordered Stop   07/17/21 0000  amoxicillin-clavulanate (AUGMENTIN) 875-125 MG tablet        1 tablet Oral 2 times daily 07/17/21 0948 07/19/21 2359   07/13/21 1115  amoxicillin-clavulanate (AUGMENTIN) 875-125 MG per tablet 1 tablet        1 tablet Oral Every 12 hours 07/13/21 1025     07/11/21 1600  vancomycin (VANCOREADY) IVPB 1000 mg/200 mL  Status:  Discontinued        1,000 mg 200 mL/hr over 60 Minutes Intravenous Every 24 hours 07/10/21 1545 07/11/21 1029   07/11/21 1400  Ampicillin-Sulbactam (UNASYN) 3 g in sodium chloride 0.9 % 100 mL IVPB  Status:  Discontinued        3 g 200 mL/hr over 30 Minutes Intravenous Every 6 hours 07/11/21 1029 07/13/21 1025   07/10/21 1630   vancomycin (VANCOREADY) IVPB 1750 mg/350 mL        1,750 mg 175 mL/hr over 120 Minutes Intravenous  Once 07/10/21 1544 07/10/21 2035   07/09/21 1230  piperacillin-tazobactam (ZOSYN) IVPB 3.375 g  Status:  Discontinued        3.375 g 12.5 mL/hr over 240 Minutes Intravenous Every 8 hours 07/09/21 1142 07/11/21 1029   06/27/21 1415  ciprofloxacin (CIPRO) IVPB 400 mg        400 mg 200 mL/hr over 60 Minutes Intravenous  Once 06/27/21 1411 06/27/21 1531   06/27/21 1415  metroNIDAZOLE (FLAGYL) IVPB 500 mg        500 mg 100 mL/hr over 60 Minutes Intravenous  Once 06/27/21 1411 06/27/21 1531          Medications  Scheduled Meds:  ALPRAZolam  0.5 mg Oral Daily   amLODipine  10 mg Oral Daily   amoxicillin-clavulanate  1 tablet Oral Q12H   aspirin EC  81 mg Oral Daily   carbidopa-levodopa  1 tablet Oral TID   Chlorhexidine Gluconate Cloth  6 each Topical Daily   feeding supplement  237 mL Oral QID   gabapentin  100 mg Oral Daily   heparin  5,000 Units Subcutaneous Q8H   ipratropium-albuterol  3 mL Nebulization TID   nystatin   Topical BID   QUEtiapine  12.5 mg Oral QHS   sodium bicarbonate  650 mg Oral BID   Continuous Infusions:   PRN Meds:.acetaminophen **OR** acetaminophen, albuterol, alum & mag hydroxide-simeth, diphenhydrAMINE-zinc acetate, haloperidol, hydrALAZINE, HYDROmorphone (DILAUDID) injection, ondansetron **OR** ondansetron (ZOFRAN) IV, oxyCODONE      Subjective:   Delno Blaisdell was seen and examined today.  Alert and oriented, appears close to his baseline.  No acute events overnight.    Objective:   Vitals:   07/16/21 2056 07/17/21 0446 07/17/21 0801 07/17/21 1306  BP:  (!) 170/68  (!) 139/46  Pulse:  74  69  Resp:  18  16  Temp:  98.2 F (36.8 C)  98.4 F (36.9 C)  TempSrc:      SpO2: 96% 98% 97% 96%  Weight:      Height:        Intake/Output Summary (Last 24 hours) at 07/17/2021 1432 Last data filed at 07/17/2021 0900 Gross per 24 hour  Intake  2160 ml  Output 850 ml  Net 1310 ml     Wt Readings from Last 3 Encounters:  06/27/21 81.2 kg  06/08/21 90.7 kg  03/04/21 90.7 kg   Physical Exam General: Alert and oriented x 3, NAD Cardiovascular: S1 S2 clear, RRR. No pedal edema b/l Respiratory: Fairly CTA B today Gastrointestinal: Soft, nontender, nondistended, NBS Ext: no pedal edema bilaterally  Data Reviewed:  I have personally reviewed following labs and imaging studies  Micro Results Recent Results (from the past 240 hour(s))  Culture, blood (routine x 2)     Status: Abnormal   Collection Time: 07/09/21 11:19 AM   Specimen: BLOOD LEFT HAND  Result Value Ref Range Status   Specimen Description   Final    BLOOD LEFT HAND BOTTLES DRAWN AEROBIC AND ANAEROBIC Performed at [facility], [address], [place], [place] [address]    Special Requests   Final    Blood Culture adequate volume Performed at [facility], [address], [place], [place] [address]    Culture  Setup Time   Final    GRAM POSITIVE COCCI AEROBIC BOTTLE ONLY Gram Stain Report Called to,Read Back By and Verified WithSusann Givens @ 2778 07/10/21 by STEPTR CRITICAL RESULT CALLED TO, READ BACK BY AND VERIFIED WITH: PIA [place] RN 07/11/2021 @2259  BY JW    Culture (A)  Final    STAPHYLOCOCCUS AURICULARIS THE SIGNIFICANCE OF ISOLATING THIS ORGANISM FROM A SINGLE SET OF BLOOD CULTURES WHEN MULTIPLE SETS ARE DRAWN IS UNCERTAIN. PLEASE NOTIFY THE MICROBIOLOGY DEPARTMENT WITHIN ONE WEEK IF SPECIATION AND SENSITIVITIES ARE REQUIRED. Performed at [facility], [place] [address], [place], [place] [address]    Report Status 07/14/2021 FINAL  Final  Blood Culture ID Panel (Reflexed)     Status: Abnormal   Collection Time: 07/09/21 11:19 AM  Result Value Ref Range Status   Enterococcus faecalis NOT DETECTED NOT DETECTED Final   Enterococcus Faecium NOT DETECTED NOT DETECTED Final   Listeria monocytogenes NOT DETECTED NOT DETECTED Final   Staphylococcus  species DETECTED (A) NOT  DETECTED Final    Comment: CRITICAL RESULT CALLED TO, READ BACK BY AND VERIFIED WITH: PIA [place] RN 07/11/2021 @2259  BY JW    Staphylococcus aureus (BCID) NOT DETECTED NOT DETECTED Final   Staphylococcus epidermidis NOT DETECTED NOT DETECTED Final   Staphylococcus lugdunensis NOT DETECTED NOT DETECTED Final   Streptococcus species NOT DETECTED NOT DETECTED Final   Streptococcus agalactiae NOT DETECTED NOT DETECTED Final   Streptococcus pneumoniae NOT DETECTED NOT DETECTED Final   Streptococcus pyogenes NOT DETECTED NOT DETECTED Final   A.calcoaceticus-baumannii NOT DETECTED NOT DETECTED Final   Bacteroides fragilis NOT DETECTED NOT DETECTED Final   Enterobacterales NOT DETECTED NOT DETECTED Final   Enterobacter cloacae complex NOT DETECTED NOT DETECTED Final   Escherichia coli NOT DETECTED NOT DETECTED Final   Klebsiella aerogenes NOT DETECTED NOT DETECTED Final   Klebsiella oxytoca NOT DETECTED NOT DETECTED Final   Klebsiella pneumoniae NOT DETECTED NOT DETECTED Final   Proteus species NOT DETECTED NOT DETECTED Final   Salmonella species NOT DETECTED NOT DETECTED Final   Serratia marcescens NOT DETECTED NOT DETECTED Final   Haemophilus influenzae NOT DETECTED NOT DETECTED Final   Neisseria meningitidis NOT DETECTED NOT DETECTED Final   Pseudomonas aeruginosa NOT DETECTED NOT DETECTED Final   Stenotrophomonas maltophilia NOT DETECTED NOT DETECTED Final   Candida albicans NOT DETECTED NOT DETECTED Final   Candida auris NOT DETECTED NOT DETECTED Final   Candida glabrata NOT DETECTED NOT DETECTED Final   Candida krusei NOT DETECTED NOT DETECTED Final   Candida parapsilosis NOT DETECTED NOT DETECTED Final   Candida tropicalis NOT DETECTED NOT DETECTED Final   Cryptococcus neoformans/gattii NOT DETECTED NOT DETECTED Final    Comment: Performed at [facility], [address] [place], [place] [address]  Culture, blood (routine x 2)     Status: None    Collection Time: 07/09/21 11:31 AM   Specimen: Right Antecubital; Blood  Result Value Ref Range Status   Specimen Description   Final    RIGHT ANTECUBITAL BOTTLES DRAWN AEROBIC AND ANAEROBIC   Special Requests Blood Culture adequate volume  Final   Culture   Final    NO GROWTH 6 DAYS Performed at [facility], [address] [place],  [address]    Report Status 07/15/2021 FINAL  Final    Radiology Reports CT ABDOMEN PELVIS WO CONTRAST  Result Date: 06/27/2021 CLINICAL DATA:  Abdominal pain. Strong urine smell. Elevated creatinine. EXAM: CT ABDOMEN AND PELVIS WITHOUT CONTRAST TECHNIQUE: Multidetector CT imaging of the abdomen and pelvis was performed following the standard protocol without IV contrast. COMPARISON:  11/03/2017. FINDINGS: Lower chest: No acute abnormality. Hepatobiliary: No focal liver abnormality is seen. No gallstones, gallbladder wall thickening, or biliary dilatation. Pancreas: Unremarkable. No pancreatic ductal dilatation or surrounding inflammatory changes. Spleen: Normal in size without focal abnormality. Adrenals/Urinary Tract: No adrenal masses. No right kidney. Mild left renal cortical thinning. No renal stone or hydronephrosis. Normal left ureter. Dilated tubular structure extends from just below the right adrenal gland to the right posterior bladder at the level of the ureteral trigone. This dilated ureter is contiguous with a bladder filling defect, also of the same attenuation. This appearance is concerning for a bladder malignancy, but is unchanged from the 11/03/2017 exam strongly favoring a benign etiology. Bladder is minimally distended, otherwise unremarkable. Stomach/Bowel: Normal stomach. Small bowel and colon are normal in caliber. No wall thickening. Subtle hazy opacity lies adjacent to the proximal to mid sigmoid colon where there are also multiple  diverticula. This is not evident on the prior CT. No other evidence of pericolonic inflammation. Normal  appendix is visualized. Vascular/Lymphatic: Aortic atherosclerosis. No aneurysm. No enlarged lymph nodes. Reproductive: Prostate is normal in size. Other: Small fat containing left inguinal hernia.  No ascites. Musculoskeletal: No fracture or acute finding.  No bone lesion. IMPRESSION: 1. Subtle hazy opacity lies adjacent to the proximal to mid sigmoid colon, where there are multiple diverticula. This is an equivocal finding, but mild uncomplicated diverticulitis should be consider if this correlates clinically. No other evidence of an acute abnormality within the abdomen or pelvis. 2. Dilated tubular structure that is contiguous with a mass projecting within the bladder. There is no visualized right kidney, presumably congenitally absent. The dilated tubular structure is likely the right gonadal vein. However, the course parallels the expected course of a right ureter. Both the bladder mass and the dilated tubular structure are stable compared to the 11/03/2017 CT. 3. Aortic atherosclerosis. Electronically Signed   By: Lajean Manes M.D.   On: 06/27/2021 13:49   CT HEAD WO CONTRAST (5MM)  Result Date: 07/15/2021 CLINICAL DATA:  Altered mental status EXAM: CT HEAD WITHOUT CONTRAST TECHNIQUE: Contiguous axial images were obtained from the base of the skull through the vertex without intravenous contrast. COMPARISON:  06/27/2021, 12/21/2019 FINDINGS: Brain: No evidence of acute infarction, hemorrhage, extra-axial collection or mass lesion/mass effect. Unchanged prominence of the bilateral lateral and third ventricles. Mild periventricular white matter hypodensity. Vascular: No hyperdense vessel or unexpected calcification. Skull: Normal. Negative for fracture or focal lesion. Sinuses/Orbits: Chronic bilateral mucosal and bony sinus wall thickening of the maxillary and sphenoid sinuses. Partial opacification of the ethmoid air cells. Small bilateral air-fluid levels of the maxillary sinuses. Other: None.  IMPRESSION: 1. No acute intracranial pathology. 2. Small-vessel white matter disease. 3. Unchanged prominence of the bilateral lateral and third ventricles, which may be related to global cerebral volume loss but can be seen in normal pressure hydrocephalus. 4. Chronic paranasal sinus disease. Electronically Signed   By: Delanna Ahmadi M.D.   On: 07/15/2021 16:17   CT Head Wo Contrast  Result Date: 06/27/2021 CLINICAL DATA:  Altered mental status. EXAM: CT HEAD WITHOUT CONTRAST TECHNIQUE: Contiguous axial images were obtained from the base of the skull through the vertex without intravenous contrast. COMPARISON:  December 21, 2019. FINDINGS: Brain: Mild chronic ischemic white matter disease is noted. Stable mild ventricular prominence is noted which may be due to mild cortical atrophy. No mass effect or midline shift is noted. There is no evidence of mass lesion, hemorrhage or acute infarction. Vascular: No hyperdense vessel or unexpected calcification. Skull: Normal. Negative for fracture or focal lesion. Sinuses/Orbits: No acute finding. Other: None. IMPRESSION: No acute intracranial abnormality seen. Stable mild ventricular dilatation as described above. Electronically Signed   By: Marijo Conception M.D.   On: 06/27/2021 13:26   US SCROTUM  Result Date: 07/05/2021 CLINICAL DATA:  Edema EXAM: ULTRASOUND OF SCROTUM TECHNIQUE: Complete ultrasound examination of the testicles, epididymis, and other scrotal structures was performed. COMPARISON:  None. FINDINGS: Right testicle Measurements: 4.5 x 2.9 x 2.2 cm. No mass or microlithiasis visualized. Left testicle Measurements: 4 x 2.7 x 3 cm. No mass or microlithiasis visualized. Right epididymis:  Normal size.  7 mm epididymal head cyst. Left epididymis:  Normal in size and appearance. Hydrocele: Moderate right hydrocele. Moderate to large left hydrocele. Varicocele:  None visualized. Diffuse scrotal skin thickening and subcutaneous edema with no focal collection  identified. IMPRESSION:  1. No testicular mass identified. 2. Bilateral moderate to large hydroceles. 3. Diffuse scrotal skin thickening and subcutaneous edema with no focal collection identified. 4. Right epididymal head cyst. Electronically Signed   By: Ofilia Neas M.D.   On: 07/05/2021 11:56   DG Chest Port 1 View  Result Date: 07/13/2021 CLINICAL DATA:  Short of breath.  History COPD EXAM: PORTABLE CHEST 1 VIEW COMPARISON:  07/09/2021 FINDINGS: Heart size upper normal. Normal vascularity. Interval improvement in bibasilar atelectasis. Improved lung volume compared to the prior study. IMPRESSION: Improved lung volume and bibasilar atelectasis compared with the prior study. No new finding. Electronically Signed   By: Franchot Gallo M.D.   On: 07/13/2021 08:45   DG Chest Port 1 View  Result Date: 07/09/2021 CLINICAL DATA:  76 year old male with shortness of breath. EXAM: PORTABLE CHEST - 1 VIEW COMPARISON:  07/07/2021 FINDINGS: The mediastinal contours are within normal limits. Unchanged mild cardiomegaly. Low lung volumes. Bibasilar subsegmental atelectasis, slightly more conspicuous than comparison. No pleural effusion or pneumothorax. No acute osseous abnormality. IMPRESSION: Low lung volumes bibasilar subsegmental atelectasis. Electronically Signed   By: Ruthann Cancer M.D.   On: 07/09/2021 10:10   DG CHEST PORT 1 VIEW  Result Date: 07/07/2021 CLINICAL DATA:  Fluid overload EXAM: PORTABLE CHEST 1 VIEW COMPARISON:  07/03/2021, 06/27/2021, 12/21/2019 FINDINGS: Low lung volumes. Borderline cardiomegaly. Mild bronchovascular crowding due to low lung volume. No pleural effusion or pneumothorax. IMPRESSION: Hypoventilatory changes with borderline cardiomegaly Electronically Signed   By: Donavan Foil M.D.   On: 07/07/2021 23:09   DG CHEST PORT 1 VIEW  Result Date: 07/03/2021 CLINICAL DATA:  Shortness of breath EXAM: PORTABLE CHEST 1 VIEW COMPARISON:  Chest x-ray 06/27/2021 FINDINGS: Heart size  is upper normal. Mediastinum appears stable. No focal consolidation identified. No significant pleural effusion. No pneumothorax. IMPRESSION: No acute intrathoracic process identified. Electronically Signed   By: Ofilia Neas M.D.   On: 07/03/2021 12:43   DG Chest Port 1 View  Result Date: 06/27/2021 CLINICAL DATA:  Altered mental status in a 75 year old male. EXAM: PORTABLE CHEST 1 VIEW COMPARISON:  June 09, 2021. FINDINGS: EKG leads project over the chest. Trachea midline. Cardiomediastinal contours and hilar structures stable. Linear opacity present over the LEFT hemidiaphragm. No visible pneumothorax. No sign of pleural effusion. On limited assessment no acute skeletal process. IMPRESSION: Linear opacity over the LEFT hemidiaphragm may represent atelectasis or scarring. No acute cardiopulmonary process. Electronically Signed   By: Zetta Bills M.D.   On: 06/27/2021 12:10   DG Swallowing Func-Speech Pathology  Result Date: 07/14/2021 Table formatting from the original result was not included. Objective Swallowing Evaluation: Type of Study: MBS-Modified Barium Swallow Study  Patient Details Name: TEL HEVIA MRN: [id number] Date of Birth: [birth year] Today's Date: 07/14/2021 Time: SLP Start Time (ACUTE ONLY): 1045 -SLP Stop Time (ACUTE ONLY): 1105 SLP Time Calculation (min) (ACUTE ONLY): 20 min Past Medical History: Past Medical History: Diagnosis Date  Acute CVA (cerebrovascular accident) ([place]) 06/06/2015  Acute ischemic stroke ([place]) 01/19/2015  Left midbrain/thalamus.  AKI (acute kidney injury) ([place]) 03/27/2020  Alcohol use   Anxiety   Cataract   Cerebral ventriculomegaly 01/20/2015  COPD (chronic obstructive pulmonary disease) ([facility])   Depression   Depression with anxiety   Hyperlipidemia   Hypertension   Neuromuscular disorder ([facility])   Parkinson's disease ([place])   Substance abuse ([place])   Tobacco abuse  Past Surgical History: Past Surgical History: Procedure Laterality Date  APPENDECTOMY    EYE  SURGERY  SHOULDER SURGERY   HPI: ALDEN BENSINGER is a 75 y.o. male with medical history significant of Parkinson's disease, prior CVA, COPD, tobacco use, hypertension, chronic lower extremity edema, hyperlipidemia, normally lives at home.  Family had brought him to ED due to what appears to be a chronic decline in his overall condition with confusion.  Patient was admitted for acute metabolic encephalopathy and associated acute kidney injury that appears to be due to rhabdomyolysis.  Patient continues to require aggressive IV fluids per nephrology.  Continue to monitor creatinine trend and follow for outcomes.  Nephrology and palliative care following. BSE completed and recommending MBS to r/o aspiration and determine safest diet.  Subjective: Pt cooperative throughout MBSS with low vocal intensity noted during conversation and decreased overall intelligibility.  Recommendations for follow up therapy are one component of a multi-disciplinary discharge planning process, led by the attending physician.  Recommendations may be updated based on patient status, additional functional criteria and insurance authorization. Assessment / Plan / Recommendation Clinical Impressions 07/14/2021 Clinical Impression Pt presents with mild oropharyngeal dysphagia characterized by mild oral containment with min disorganized swallow during larger volumes/regular consistencies, but eventually cleared oral cavity.  Pt with an overall delay in the initation of the swallow with all consistencies to the level of the valleculae.  This affected larger volumes of liquids primarily as thin liquids penetrated the laryngeal vestibule d/t decreased laryngeal closure, but did not result in aspiration.  Pt is at risk for aspiration with thin via larger volumes d/t impulsivity during meals.  If pt utilizes small sips, he reduces his risk for aspiration.  During transition of barium tablet during the study, pt required a repetitive swallow/liquid  wash to clear vallecular space.  Recommend continuing Dysphagia 3/thin liquids with SMALL SIPS and medications given in puree with FULL supervision during meals/snacks.  ST will continue to f/u in acute setting for aspiration/swallowing precaution education and diet tolerance. SLP Visit Diagnosis Dysphagia, oropharyngeal phase (R13.12) Attention and concentration deficit following -- Frontal lobe and executive function deficit following -- Impact on safety and function Mild aspiration risk;Moderate aspiration risk   Treatment Recommendations 07/14/2021 Treatment Recommendations Therapy as outlined in treatment plan below   Prognosis 07/14/2021 Prognosis for Safe Diet Advancement Good Barriers to Reach Goals Behavior Barriers/Prognosis Comment -- Diet Recommendations 07/14/2021 SLP Diet Recommendations Dysphagia 3 (Mech soft) solids;Thin liquid;Other (Comment) Liquid Administration via Cup;Straw;Other (Comment) Medication Administration Whole meds with puree Compensations Slow rate;Small sips/bites;Minimize environmental distractions;Effortful swallow;Multiple dry swallows after each bite/sip Postural Changes Seated upright at 90 degrees   Other Recommendations 07/14/2021 Recommended Consults -- Oral Care Recommendations Oral care BID Other Recommendations -- Follow Up Recommendations Skilled nursing-short term rehab (<3 hours/day) Assistance recommended at discharge Intermittent Supervision/Assistance Functional Status Assessment Patient has had a recent decline in their functional status and demonstrates the ability to make significant improvements in function in a reasonable and predictable amount of time. Frequency and Duration  07/14/2021 Speech Therapy Frequency (ACUTE ONLY) min 2x/week Treatment Duration 1 week   Oral Phase 07/14/2021 Oral Phase WFL Oral - Pudding Teaspoon -- Oral - Pudding Cup -- Oral - Honey Teaspoon -- Oral - Honey Cup -- Oral - Nectar Teaspoon -- Oral - Nectar Cup -- Oral - Nectar Straw --  Oral - Thin Teaspoon -- Oral - Thin Cup -- Oral - Thin Straw -- Oral - Puree -- Oral - Mech Soft -- Oral - Regular -- Oral - Multi-Consistency -- Oral - Pill -- Oral Phase - Comment --  Pharyngeal Phase 07/14/2021 Pharyngeal Phase Impaired Pharyngeal- Pudding Teaspoon NT Pharyngeal -- Pharyngeal- Pudding Cup NT Pharyngeal -- Pharyngeal- Honey Teaspoon -- Pharyngeal -- Pharyngeal- Honey Cup -- Pharyngeal -- Pharyngeal- Nectar Teaspoon -- Pharyngeal -- Pharyngeal- Nectar Cup -- Pharyngeal -- Pharyngeal- Nectar Straw -- Pharyngeal -- Pharyngeal- Thin Teaspoon NT Pharyngeal -- Pharyngeal- Thin Cup Penetration/Aspiration during swallow;Delayed swallow initiation-vallecula;Reduced airway/laryngeal closure Pharyngeal -- Pharyngeal- Thin Straw Delayed swallow initiation-vallecula;Reduced airway/laryngeal closure;Penetration/Aspiration during swallow Pharyngeal Material enters airway, remains ABOVE vocal cords then ejected out Pharyngeal- Puree Delayed swallow initiation-vallecula Pharyngeal Material does not enter airway Pharyngeal- Mechanical Soft Delayed swallow initiation-vallecula Pharyngeal Material does not enter airway Pharyngeal- Regular Delayed swallow initiation-vallecula Pharyngeal Material does not enter airway Pharyngeal- Multi-consistency NT Pharyngeal -- Pharyngeal- Pill Delayed swallow initiation-vallecula Pharyngeal Material does not enter airway Pharyngeal Comment Delay in the initiation of the swallow to the level of the valleculae with penetration with larger volumes of liquids noted  Cervical Esophageal Phase  07/14/2021 Cervical Esophageal Phase WFL Pudding Teaspoon -- Pudding Cup -- Honey Teaspoon -- Honey Cup -- Nectar Teaspoon -- Nectar Cup -- Nectar Straw -- Thin Teaspoon -- Thin Cup -- Thin Straw -- Puree -- Mechanical Soft -- Regular -- Multi-consistency -- Pill -- Cervical Esophageal Comment -- Elvina Sidle, M.S., CCC-SLP 07/14/2021, 3:18 PM                      Lab Data:  CBC: Recent Labs   Lab 07/13/21 0615  WBC 10.5  HGB 9.7*  HCT 30.8*  MCV 99.7  PLT 601*   Basic Metabolic Panel: Recent Labs  Lab 07/11/21 0507 07/12/21 0505 07/13/21 0615 07/15/21 1417  NA 136  --  144 137  K 3.0*  --  2.8* 4.0  CL 105  --  112* 105  CO2 23  --  20* 22  GLUCOSE 112*  --  77 133*  BUN 35*  --  27* 19  CREATININE 1.71*  --  1.27* 1.10  CALCIUM 8.0*  --  8.5* 8.5*  MG 1.7 2.2  --   --    GFR: Estimated Creatinine Clearance: 57.9 mL/min (by C-G formula based on SCr of 1.1 mg/dL). Liver Function Tests: No results for input(s): AST, ALT, ALKPHOS, BILITOT, PROT, ALBUMIN in the last 168 hours.  No results for input(s): LIPASE, AMYLASE in the last 168 hours. Recent Labs  Lab 07/15/21 1417  AMMONIA 18   Coagulation Profile: No results for input(s): INR, PROTIME in the last 168 hours. Cardiac Enzymes: Recent Labs  Lab 07/11/21 0507 07/12/21 0505  CKTOTAL 822* 703*   BNP (last 3 results) No results for input(s): PROBNP in the last 8760 hours. HbA1C: No results for input(s): HGBA1C in the last 72 hours. CBG: Recent Labs  Lab 07/15/21 1343  GLUCAP 124*   Lipid Profile: No results for input(s): CHOL, HDL, LDLCALC, TRIG, CHOLHDL, LDLDIRECT in the last 72 hours. Thyroid Function Tests: No results for input(s): TSH, T4TOTAL, FREET4, T3FREE, THYROIDAB in the last 72 hours. Anemia Panel: No results for input(s): VITAMINB12, FOLATE, FERRITIN, TIBC, IRON, RETICCTPCT in the last 72 hours. Urine analysis:    Component Value Date/Time   COLORURINE YELLOW 07/15/2021 [place] 07/15/2021 1738   LABSPEC 1.009 07/15/2021 1738   PHURINE 8.0 07/15/2021 [place] 07/15/2021 1738   HGBUR NEGATIVE 07/15/2021 [place] 07/15/2021 [place] 07/15/2021 1738   PROTEINUR NEGATIVE 07/15/2021 1738   UROBILINOGEN  0.2 06/05/2015 1821   NITRITE NEGATIVE 07/15/2021 [place] 07/15/2021 1738      Leesa Leifheit M.D. [facility] 07/17/2021, 2:32 PM  Available via Epic secure chat 7am-7pm After 7 pm, please refer to night coverage provider listed on amion.

## 2021-07-17 NOTE — TOC Progression Note (Addendum)
Transition of Care [facility]) - Progression Note    Patient Details  Name: Lucas [name] MRN: [id number] Date of Birth: Sep 17, 1945  [facility]) CM/SW Contact  [name], LCSW Phone Number: 07/17/2021, 4:27 PM  Clinical Narrative:    CSW spoke with [name] of [facility] to follow up on previous conversation on 11/16 agreeing to take patient today on 07/17/21 with understanding that [name] was to be started 07/13/21. Today [name] expressed concern about patient's behavior and reported that they would have check with DON to confirm if they would still be able to extend bed offer. [name] returned phone call to report that DON agreeable to take patient and reported that auth still needs to be started. CSW requested updated PT notes for auth and updated TOC supervisor. TOC to follow.    Expected Discharge Plan: [place] Barriers to Discharge: Continued Medical Work up  Expected Discharge Plan and Services Expected Discharge Plan: [place] In-house Referral: Clinical Social Work   Post Acute Care Choice: [place] Living arrangements for the past 2 months: Single Family Home Expected Discharge Date: 07/17/21                                     Social Determinants of Health (SDOH) Interventions    Readmission Risk Interventions Readmission Risk Prevention Plan 07/04/2021 06/29/2021 03/28/2020  Transportation Screening Complete Complete Complete  Home Care Screening Complete Complete Complete  Medication Review (RN CM) Complete - Complete  Some recent data might be hidden

## 2021-07-18 DIAGNOSIS — G9341 Metabolic encephalopathy: Secondary | ICD-10-CM | POA: Diagnosis not present

## 2021-07-18 DIAGNOSIS — N179 Acute kidney failure, unspecified: Secondary | ICD-10-CM | POA: Diagnosis not present

## 2021-07-18 DIAGNOSIS — J9602 Acute respiratory failure with hypercapnia: Secondary | ICD-10-CM | POA: Diagnosis not present

## 2021-07-18 MED ORDER — IPRATROPIUM-ALBUTEROL 0.5-2.5 (3) MG/3ML IN SOLN
3.0000 mL | Freq: Two times a day (BID) | RESPIRATORY_TRACT | Status: DC
  Administered 2021-07-19 – 2021-07-21 (×5): 3 mL via RESPIRATORY_TRACT
  Filled 2021-07-18 (×5): qty 3

## 2021-07-18 NOTE — Plan of Care (Signed)
  Problem: Education: Goal: Knowledge of General Education information will improve Description: Including pain rating scale, medication(s)/side effects and non-pharmacologic comfort measures Outcome: Progressing   Problem: Clinical Measurements: Goal: Ability to maintain clinical measurements within normal limits will improve Outcome: Progressing   

## 2021-07-18 NOTE — TOC Progression Note (Signed)
Transition of Care [facility]) - Progression Note    Patient Details  Name: Lucas [name] MRN: [id number] Date of Birth: 04-07-1946  Transition of Care [facility]) CM/SW Contact  [name], LCSW Phone Number: 07/18/2021, 2:24 PM  Clinical Narrative:    CSW contacted Amber with [facility] to follow up on auth. Amber reported [name] has been started and reported the plan ID as [id number]. TOC supervisor notified CSW that [facility] would be able to provide 5 day LOG until auth is received. Amber reported that Administration would not be able to accept LOG. TOC to follow.   Expected Discharge Plan: [place] Barriers to Discharge: Continued Medical Work up  Expected Discharge Plan and Services Expected Discharge Plan: [place] In-house Referral: Clinical Social Work   Post Acute Care Choice: [place] Living arrangements for the past 2 months: Single Family Home Expected Discharge Date: 07/17/21                                     Social Determinants of Health (SDOH) Interventions    Readmission Risk Interventions Readmission Risk Prevention Plan 07/04/2021 06/29/2021 03/28/2020  Transportation Screening Complete Complete Complete  Home Care Screening Complete Complete Complete  Medication Review (RN CM) Complete - Complete  Some recent data might be hidden

## 2021-07-18 NOTE — Progress Notes (Signed)
Physical Therapy Treatment Patient Details Name: Lucas [name] MRN: [id number] DOB: 1945-11-26 Today's Date: 07/18/2021   History of Present Illness Lucas [name] is a 75 y.o. male presents with chronic decline in his overall condition with confusion. Pt admitted for acute metabolic encephalopathy and associated acute kidney injury that appears to be due to rhabdomyolysis. PMH: Parkinson's disease, prior CVA, COPD, tobacco use, HTN, chronic lower extremity edema, hyperlipidemia    PT Comments    Patient cooperative and agreeable to participate with therapy today. He requires assist with repositioning in bed and cueing for all bed mobility performed. He requires assist with heel slides secondary to weakness and requires intermittent cueing to continue performing exercises. Patient assisted with repositioning in bed at end of session. Patient will benefit from continued physical therapy in hospital and recommended venue below to increase strength, balance, endurance for safe ADLs and gait.    Recommendations for follow up therapy are one component of a multi-disciplinary discharge planning process, led by the attending physician.  Recommendations may be updated based on patient status, additional functional criteria and insurance authorization.  Follow Up Recommendations  Skilled nursing-short term rehab (<3 hours/day)     Assistance Recommended at Discharge Intermittent Supervision/Assistance  Equipment Recommendations  None recommended by PT    Recommendations for Other Services       Precautions / Restrictions Precautions Precautions: Fall Restrictions Weight Bearing Restrictions: No     Mobility  Bed Mobility Overal bed mobility: Needs Assistance Bed Mobility: Rolling Rolling: Mod assist;Max assist Sidelying to sit: Max assist Supine to sit: Max assist     General bed mobility comments: slow, labored movements with cueing for sequencing; assist for weakness     Transfers                        Ambulation/Gait                   Stairs             Wheelchair Mobility    Modified Rankin (Stroke Patients Only)       Balance                                            Cognition Arousal/Alertness: Awake/alert Behavior During Therapy: WFL for tasks assessed/performed Overall Cognitive Status: Within Functional Limits for tasks assessed                                          Exercises General Exercises - Lower Extremity Quad Sets: AROM;Both;10 reps;Supine Hip ABduction/ADduction: AAROM;Both;15 reps;Supine    General Comments        Pertinent Vitals/Pain Pain Assessment: No/denies pain    Home Living                          Prior Function            PT Goals (current goals can now be found in the care plan section) Acute Rehab PT Goals Patient Stated Goal: return home after rehab PT Goal Formulation: With patient Time For Goal Achievement: 07/18/21 Potential to Achieve Goals: Good Progress towards PT goals: Progressing toward goals    Frequency    Min  3X/week      PT Plan Current plan remains appropriate    Co-evaluation              AM-PAC PT "6 Clicks" Mobility   Outcome Measure  Help needed turning from your back to your side while in a flat bed without using bedrails?: A Lot Help needed moving from lying on your back to sitting on the side of a flat bed without using bedrails?: A Lot Help needed moving to and from a bed to a chair (including a wheelchair)?: Total Help needed standing up from a chair using your arms (e.g., wheelchair or bedside chair)?: Total Help needed to walk in hospital room?: Total Help needed climbing 3-5 steps with a railing? : Total 6 Click Score: 8    End of Session   Activity Tolerance: Patient tolerated treatment well;Patient limited by fatigue Patient left: in bed;with call bell/phone within  reach Nurse Communication: Mobility status PT Visit Diagnosis: Other abnormalities of gait and mobility (R26.89);Muscle weakness (generalized) (M62.81);Unsteadiness on feet (R26.81)     Time: 4665-9935 PT Time Calculation (min) (ACUTE ONLY): 15 min  Charges:  $Therapeutic Exercise: 8-22 mins                    9:28 AM, 07/18/21 Mearl Latin PT, DPT Physical Therapist at [facility]

## 2021-07-18 NOTE — Discharge Summary (Signed)
Physician Discharge Summary   Patient ID: Lucas [name] MRN: [id number] DOB/AGE: 1946/02/21 75 y.o.  Admit date: 06/27/2021 Discharge date: 07/18/2021  Primary Care Physician:  Alfonse Flavors, MD   Recommendations for Outpatient Follow-up:  Follow up with PCP in 1-2 weeks  Home Health: Patient going to SNF Equipment/Devices:   Discharge Condition: stable CODE STATUS: DNR   Diet recommendation: Dysphagia 3 diet with thin liquids   Discharge Diagnoses:     Acute on chronic renal failure ([place]) with hypoxia  Acute metabolic encephalopathy   Acute kidney injury, multifactorial, exacerbated by rhabdomyolysis  Parkinson's disease ([place]) Dementia  Essential hypertension  Hypokalemia   Transaminitis secondary to rhabdomyolysis  COPD (chronic obstructive pulmonary disease) ([place])  Hyperlipidemia  Bladder mass  Pressure injury   Consults:    Palliative medicine Nephrology  Allergies:  No Known Allergies   DISCHARGE MEDICATIONS: Allergies as of 07/18/2021   No Known Allergies      Medication List     STOP taking these medications    busPIRone 10 MG tablet Commonly known as: BUSPAR   clonazePAM 1 MG tablet Commonly known as: KLONOPIN   furosemide 20 MG tablet Commonly known as: LASIX   furosemide 80 MG tablet Commonly known as: LASIX   hydrochlorothiazide 25 MG tablet Commonly known as: HYDRODIURIL   morphine 15 MG 12 hr tablet Commonly known as: MS CONTIN   PARoxetine 40 MG tablet Commonly known as: PAXIL   rOPINIRole 3 MG tablet Commonly known as: REQUIP       TAKE these medications    albuterol 108 (90 Base) MCG/ACT inhaler Commonly known as: VENTOLIN HFA Inhale 1 puff into the lungs every 4 (four) hours as needed for wheezing or shortness of breath.   ALPRAZolam 0.5 MG tablet Commonly known as: XANAX Take 1 tablet (0.5 mg total) by mouth in the morning.   amLODipine 10 MG tablet Commonly known as: NORVASC Take 1 tablet  (10 mg total) by mouth daily.   amoxicillin-clavulanate 875-125 MG tablet Commonly known as: AUGMENTIN Take 1 tablet by mouth 2 (two) times daily for 2 days.   aspirin 81 MG EC tablet Take 1 tablet (81 mg total) by mouth daily.   atorvastatin 10 MG tablet Commonly known as: LIPITOR Take 10 mg by mouth every evening.   carbidopa-levodopa 25-100 MG tablet Commonly known as: SINEMET IR Take 1 tablet by mouth 3 (three) times daily.   cholecalciferol 25 MCG (1000 UNIT) tablet Commonly known as: VITAMIN D3 Take 1,000 Units by mouth daily.   Dermacloud Crea Apply 1 application topically daily.   famotidine 20 MG tablet Commonly known as: PEPCID Take 20 mg by mouth 2 (two) times daily.   gabapentin 100 MG capsule Commonly known as: NEURONTIN Take 1 capsule (100 mg total) by mouth daily. What changed:  medication strength how much to take when to take this   haloperidol 1 MG tablet Commonly known as: HALDOL Take 1 tablet (1 mg total) by mouth every 8 (eight) hours as needed for agitation.   ipratropium-albuterol 0.5-2.5 (3) MG/3ML Soln Commonly known as: DUONEB SMARTSIG:3 Milliliter(s) Via Nebulizer Every 6 Hours   lisinopril 20 MG tablet Commonly known as: ZESTRIL Take 20 mg by mouth daily.   nystatin powder Commonly known as: MYCOSTATIN/NYSTOP Apply topically 2 (two) times daily.   oxyCODONE 5 MG immediate release tablet Commonly known as: Oxy IR/ROXICODONE Take 1 tablet (5 mg total) by mouth every 6 (six) hours as needed for breakthrough pain or  severe pain.   QUEtiapine 25 MG tablet Commonly known as: SEROQUEL Take 0.5 tablets (12.5 mg total) by mouth at bedtime.   sodium bicarbonate 650 MG tablet Take 1 tablet (650 mg total) by mouth daily.   vitamin B-12 1000 MCG tablet Commonly known as: CYANOCOBALAMIN Take 1,000 mcg by mouth daily.               Discharge Care Instructions  (From admission, onward)           Start     Ordered    07/17/21 0000  Discharge wound care:       Comments: Per instructions   07/17/21 0948             Brief H and P: For complete details please refer to admission H and P, but in brief,  Patient is a 75 year old, COPD, tobacco use, hypertension, chronic lower extremity edema, hyperlipidemia, lives at home.  Family brought him to ED due to what appeared to be a chronic decline in his overall condition with confusion.  Patient was admitted for acute metabolic encephalopathy, associated acute kidney injury, rhabdomyolysis.  He was placed on aggressive IV fluids per nephrology.  Palliative medicine was also consulted.    Hospital Course:   Acute Respiratory failure with hypoxia, ?bacteremia -1/4 blood cultures with gram-positive cocci, staph auricularis, likely contaminant, patient was briefly placed on vancomycin, discontinued - Family requesting short-term rehab however given his prognosis and decline, may not tolerate rehab.  At baseline, bedbound has been declining, underlying dementia, Parkinson's disease. -SLP evaluation confirms aspiration risk, placed on dysphagia 3 diet, thin liquids -Tolerating diet,O2 sats 96% on room air    Acute metabolic encephalopathy, superimposed on dementia -TSH normal -Continue Seroquel to 12.5 mg nightly, decrease Neurontin 100 mg daily (was 100 mg twice daily),  -CT head negative for any stroke or acute intracranial process.  Ammonia level 18 -Currently alert and oriented closer to his baseline.   Acute kidney injury, multifactorial exacerbated by rhabdomyolysis, NAGMA -Creatinine as high as 8.4 on 07/03/2021, now improved to 1.1 -Nephrology was consulted, not a candidate for hemodialysis, overall poor prognosis, nephrology signed off on 11/14 -Bicarb improving, 22, discontinue sodium bicarb once > 25   Hypokalemia -Potassium was replaced on 11/17    Transaminitis secondary to rhabdomyolysis -Resolved     Parkinson's disease ([place]),  dementia -Continue Sinemet   Hypertension -Continue Norvasc 10 mg daily   COPD -Lung sounds much clear, continue nebs, aspiration precautions     Bladder mass -Appears to have been present on CT imaging from 2019 and unchanged, outpatient follow-up with urology   CVA generalized debility, bedbound severe -Overall global weakness, per nursing staff able to move with [facility] lift.  Family pursuing SNF/STR    Pressure Injury Documentation: Pressure Injury 06/28/21 Heel Left Deep Tissue Pressure Injury - Purple or maroon localized area of discolored intact skin or blood-filled blister due to damage of underlying soft tissue from pressure and/or shear. (Active)  06/28/21 0800  Location: Heel  Location Orientation: Left  Staging: Deep Tissue Pressure Injury - Purple or maroon localized area of discolored intact skin or blood-filled blister due to damage of underlying soft tissue from pressure and/or shear.  Wound Description (Comments):   Present on Admission: Yes     Pressure Injury 06/28/21 Buttocks Left Unstageable - Full thickness tissue loss in which the base of the injury is covered by slough (yellow, tan, gray, green or brown) and/or eschar (tan, brown or black)  in the wound bed. Wound had black/gray thick cove (Active)  06/28/21 0800  Location: Buttocks  Location Orientation: Left  Staging: Unstageable - Full thickness tissue loss in which the base of the injury is covered by slough (yellow, tan, gray, green or brown) and/or eschar (tan, brown or black) in the wound bed.  Wound Description (Comments): Wound had black/gray thick covering, no drainage  Present on Admission: Yes     Pressure Injury 07/02/21 Rectum Mid Stage 2 -  Partial thickness loss of dermis presenting as a shallow open injury with a red, pink wound bed without slough. skin red, open, small amount of drainage (Active)  07/02/21 0845  Location: Rectum  Location Orientation: Mid  Staging: Stage 2 -  Partial  thickness loss of dermis presenting as a shallow open injury with a red, pink wound bed without slough.  Wound Description (Comments): skin red, open, small amount of drainage  Present on Admission:       Day of Discharge S: No complaints, lungs clear, afebrile, no acute issues overnight, awaiting skilled nursing facility  BP (!) 164/50 (BP Location: Right Arm)   Pulse 86   Temp 98.4 F (36.9 C) (Oral)   Resp 19   Ht 5\' 8"  (1.727 m)   Wt 81.2 kg   SpO2 97%   BMI 27.22 kg/m   Physical Exam: General: Alert and awake oriented x3 not in any acute distress. CVS: S1-S2 clear no murmur rubs or gallops Chest: clear to auscultation bilaterally, no wheezing Abdomen: soft nontender, nondistended, normal bowel sounds Extremities: no cyanosis, clubbing or edema noted bilaterally    Get Medicines reviewed and adjusted: Please take all your medications with you for your next visit with your Primary MD  Please request your Primary MD to go over all hospital tests and procedure/radiological results at the follow up. Please ask your Primary MD to get all Hospital records sent to his/her office.  If you experience worsening of your admission symptoms, develop shortness of breath, life threatening emergency, suicidal or homicidal thoughts you must seek medical attention immediately by calling 911 or calling your MD immediately  if symptoms less severe.  You must read complete instructions/literature along with all the possible adverse reactions/side effects for all the Medicines you take and that have been prescribed to you. Take any new Medicines after you have completely understood and accept all the possible adverse reactions/side effects.   Do not drive when taking pain medications.   Do not take more than prescribed Pain, Sleep and Anxiety Medications  Special Instructions: If you have smoked or chewed Tobacco  in the last 2 yrs please stop smoking, stop any regular Alcohol  and or any  Recreational drug use.  Wear Seat belts while driving.  Please note  You were cared for by a hospitalist during your hospital stay. Once you are discharged, your primary care physician will handle any further medical issues. Please note that NO REFILLS for any discharge medications will be authorized once you are discharged, as it is imperative that you return to your primary care physician (or establish a relationship with a primary care physician if you do not have one) for your aftercare needs so that they can reassess your need for medications and monitor your lab values.   The results of significant diagnostics from this hospitalization (including imaging, microbiology, ancillary and laboratory) are listed below for reference.      Procedures/Studies:  CT ABDOMEN PELVIS WO CONTRAST  Result Date: 06/27/2021  CLINICAL DATA:  Abdominal pain. Strong urine smell. Elevated creatinine. EXAM: CT ABDOMEN AND PELVIS WITHOUT CONTRAST TECHNIQUE: Multidetector CT imaging of the abdomen and pelvis was performed following the standard protocol without IV contrast. COMPARISON:  11/03/2017. FINDINGS: Lower chest: No acute abnormality. Hepatobiliary: No focal liver abnormality is seen. No gallstones, gallbladder wall thickening, or biliary dilatation. Pancreas: Unremarkable. No pancreatic ductal dilatation or surrounding inflammatory changes. Spleen: Normal in size without focal abnormality. Adrenals/Urinary Tract: No adrenal masses. No right kidney. Mild left renal cortical thinning. No renal stone or hydronephrosis. Normal left ureter. Dilated tubular structure extends from just below the right adrenal gland to the right posterior bladder at the level of the ureteral trigone. This dilated ureter is contiguous with a bladder filling defect, also of the same attenuation. This appearance is concerning for a bladder malignancy, but is unchanged from the 11/03/2017 exam strongly favoring a benign etiology. Bladder  is minimally distended, otherwise unremarkable. Stomach/Bowel: Normal stomach. Small bowel and colon are normal in caliber. No wall thickening. Subtle hazy opacity lies adjacent to the proximal to mid sigmoid colon where there are also multiple diverticula. This is not evident on the prior CT. No other evidence of pericolonic inflammation. Normal appendix is visualized. Vascular/Lymphatic: Aortic atherosclerosis. No aneurysm. No enlarged lymph nodes. Reproductive: Prostate is normal in size. Other: Small fat containing left inguinal hernia.  No ascites. Musculoskeletal: No fracture or acute finding.  No bone lesion. IMPRESSION: 1. Subtle hazy opacity lies adjacent to the proximal to mid sigmoid colon, where there are multiple diverticula. This is an equivocal finding, but mild uncomplicated diverticulitis should be consider if this correlates clinically. No other evidence of an acute abnormality within the abdomen or pelvis. 2. Dilated tubular structure that is contiguous with a mass projecting within the bladder. There is no visualized right kidney, presumably congenitally absent. The dilated tubular structure is likely the right gonadal vein. However, the course parallels the expected course of a right ureter. Both the bladder mass and the dilated tubular structure are stable compared to the 11/03/2017 CT. 3. Aortic atherosclerosis. Electronically Signed   By: Lajean Manes M.D.   On: 06/27/2021 13:49   CT HEAD WO CONTRAST (5MM)  Result Date: 07/15/2021 CLINICAL DATA:  Altered mental status EXAM: CT HEAD WITHOUT CONTRAST TECHNIQUE: Contiguous axial images were obtained from the base of the skull through the vertex without intravenous contrast. COMPARISON:  06/27/2021, 12/21/2019 FINDINGS: Brain: No evidence of acute infarction, hemorrhage, extra-axial collection or mass lesion/mass effect. Unchanged prominence of the bilateral lateral and third ventricles. Mild periventricular white matter hypodensity.  Vascular: No hyperdense vessel or unexpected calcification. Skull: Normal. Negative for fracture or focal lesion. Sinuses/Orbits: Chronic bilateral mucosal and bony sinus wall thickening of the maxillary and sphenoid sinuses. Partial opacification of the ethmoid air cells. Small bilateral air-fluid levels of the maxillary sinuses. Other: None. IMPRESSION: 1. No acute intracranial pathology. 2. Small-vessel white matter disease. 3. Unchanged prominence of the bilateral lateral and third ventricles, which may be related to global cerebral volume loss but can be seen in normal pressure hydrocephalus. 4. Chronic paranasal sinus disease. Electronically Signed   By: Delanna Ahmadi M.D.   On: 07/15/2021 16:17   CT Head Wo Contrast  Result Date: 06/27/2021 CLINICAL DATA:  Altered mental status. EXAM: CT HEAD WITHOUT CONTRAST TECHNIQUE: Contiguous axial images were obtained from the base of the skull through the vertex without intravenous contrast. COMPARISON:  December 21, 2019. FINDINGS: Brain: Mild chronic ischemic white matter disease  is noted. Stable mild ventricular prominence is noted which may be due to mild cortical atrophy. No mass effect or midline shift is noted. There is no evidence of mass lesion, hemorrhage or acute infarction. Vascular: No hyperdense vessel or unexpected calcification. Skull: Normal. Negative for fracture or focal lesion. Sinuses/Orbits: No acute finding. Other: None. IMPRESSION: No acute intracranial abnormality seen. Stable mild ventricular dilatation as described above. Electronically Signed   By: Marijo Conception M.D.   On: 06/27/2021 13:26   US SCROTUM  Result Date: 07/05/2021 CLINICAL DATA:  Edema EXAM: ULTRASOUND OF SCROTUM TECHNIQUE: Complete ultrasound examination of the testicles, epididymis, and other scrotal structures was performed. COMPARISON:  None. FINDINGS: Right testicle Measurements: 4.5 x 2.9 x 2.2 cm. No mass or microlithiasis visualized. Left testicle Measurements: 4  x 2.7 x 3 cm. No mass or microlithiasis visualized. Right epididymis:  Normal size.  7 mm epididymal head cyst. Left epididymis:  Normal in size and appearance. Hydrocele: Moderate right hydrocele. Moderate to large left hydrocele. Varicocele:  None visualized. Diffuse scrotal skin thickening and subcutaneous edema with no focal collection identified. IMPRESSION: 1. No testicular mass identified. 2. Bilateral moderate to large hydroceles. 3. Diffuse scrotal skin thickening and subcutaneous edema with no focal collection identified. 4. Right epididymal head cyst. Electronically Signed   By: Ofilia Neas M.D.   On: 07/05/2021 11:56   DG Chest Port 1 View  Result Date: 07/13/2021 CLINICAL DATA:  Short of breath.  History COPD EXAM: PORTABLE CHEST 1 VIEW COMPARISON:  07/09/2021 FINDINGS: Heart size upper normal. Normal vascularity. Interval improvement in bibasilar atelectasis. Improved lung volume compared to the prior study. IMPRESSION: Improved lung volume and bibasilar atelectasis compared with the prior study. No new finding. Electronically Signed   By: Franchot Gallo M.D.   On: 07/13/2021 08:45   DG Chest Port 1 View  Result Date: 07/09/2021 CLINICAL DATA:  75 year old male with shortness of breath. EXAM: PORTABLE CHEST - 1 VIEW COMPARISON:  07/07/2021 FINDINGS: The mediastinal contours are within normal limits. Unchanged mild cardiomegaly. Low lung volumes. Bibasilar subsegmental atelectasis, slightly more conspicuous than comparison. No pleural effusion or pneumothorax. No acute osseous abnormality. IMPRESSION: Low lung volumes bibasilar subsegmental atelectasis. Electronically Signed   By: Ruthann Cancer M.D.   On: 07/09/2021 10:10   DG CHEST PORT 1 VIEW  Result Date: 07/07/2021 CLINICAL DATA:  Fluid overload EXAM: PORTABLE CHEST 1 VIEW COMPARISON:  07/03/2021, 06/27/2021, 12/21/2019 FINDINGS: Low lung volumes. Borderline cardiomegaly. Mild bronchovascular crowding due to low lung volume. No  pleural effusion or pneumothorax. IMPRESSION: Hypoventilatory changes with borderline cardiomegaly Electronically Signed   By: Donavan Foil M.D.   On: 07/07/2021 23:09   DG CHEST PORT 1 VIEW  Result Date: 07/03/2021 CLINICAL DATA:  Shortness of breath EXAM: PORTABLE CHEST 1 VIEW COMPARISON:  Chest x-ray 06/27/2021 FINDINGS: Heart size is upper normal. Mediastinum appears stable. No focal consolidation identified. No significant pleural effusion. No pneumothorax. IMPRESSION: No acute intrathoracic process identified. Electronically Signed   By: Ofilia Neas M.D.   On: 07/03/2021 12:43   DG Chest Port 1 View  Result Date: 06/27/2021 CLINICAL DATA:  Altered mental status in a 75 year old male. EXAM: PORTABLE CHEST 1 VIEW COMPARISON:  June 09, 2021. FINDINGS: EKG leads project over the chest. Trachea midline. Cardiomediastinal contours and hilar structures stable. Linear opacity present over the LEFT hemidiaphragm. No visible pneumothorax. No sign of pleural effusion. On limited assessment no acute skeletal process. IMPRESSION: Linear opacity over the LEFT hemidiaphragm  may represent atelectasis or scarring. No acute cardiopulmonary process. Electronically Signed   By: Zetta Bills M.D.   On: 06/27/2021 12:10   DG Swallowing Func-Speech Pathology  Result Date: 07/14/2021 Table formatting from the original result was not included. Objective Swallowing Evaluation: Type of Study: MBS-Modified Barium Swallow Study  Patient Details Name: Lucas [name] MRN: [id number] Date of Birth: [birth year] Today's Date: 07/14/2021 Time: SLP Start Time (ACUTE ONLY): 1045 -SLP Stop Time (ACUTE ONLY): 1105 SLP Time Calculation (min) (ACUTE ONLY): 20 min Past Medical History: Past Medical History: Diagnosis Date  Acute CVA (cerebrovascular accident) ([place]) 06/06/2015  Acute ischemic stroke ([place]) 01/19/2015  Left midbrain/thalamus.  AKI (acute kidney injury) ([place]) 03/27/2020  Alcohol use   Anxiety   Cataract   Cerebral  ventriculomegaly 01/20/2015  COPD (chronic obstructive pulmonary disease) ([facility])   Depression   Depression with anxiety   Hyperlipidemia   Hypertension   Neuromuscular disorder ([facility])   Parkinson's disease ([place])   Substance abuse ([place])   Tobacco abuse  Past Surgical History: Past Surgical History: Procedure Laterality Date  APPENDECTOMY    EYE SURGERY    SHOULDER SURGERY   HPI: Lucas [name] is a 75 y.o. male with medical history significant of Parkinson's disease, prior CVA, COPD, tobacco use, hypertension, chronic lower extremity edema, hyperlipidemia, normally lives at home.  Family had brought him to ED due to what appears to be a chronic decline in his overall condition with confusion.  Patient was admitted for acute metabolic encephalopathy and associated acute kidney injury that appears to be due to rhabdomyolysis.  Patient continues to require aggressive IV fluids per nephrology.  Continue to monitor creatinine trend and follow for outcomes.  Nephrology and palliative care following. BSE completed and recommending MBS to r/o aspiration and determine safest diet.  Subjective: Pt cooperative throughout MBSS with low vocal intensity noted during conversation and decreased overall intelligibility.  Recommendations for follow up therapy are one component of a multi-disciplinary discharge planning process, led by the attending physician.  Recommendations may be updated based on patient status, additional functional criteria and insurance authorization. Assessment / Plan / Recommendation Clinical Impressions 07/14/2021 Clinical Impression Pt presents with mild oropharyngeal dysphagia characterized by mild oral containment with min disorganized swallow during larger volumes/regular consistencies, but eventually cleared oral cavity.  Pt with an overall delay in the initation of the swallow with all consistencies to the level of the valleculae.  This affected larger volumes of liquids primarily as thin liquids  penetrated the laryngeal vestibule d/t decreased laryngeal closure, but did not result in aspiration.  Pt is at risk for aspiration with thin via larger volumes d/t impulsivity during meals.  If pt utilizes small sips, he reduces his risk for aspiration.  During transition of barium tablet during the study, pt required a repetitive swallow/liquid wash to clear vallecular space.  Recommend continuing Dysphagia 3/thin liquids with SMALL SIPS and medications given in puree with FULL supervision during meals/snacks.  ST will continue to f/u in acute setting for aspiration/swallowing precaution education and diet tolerance. SLP Visit Diagnosis Dysphagia, oropharyngeal phase (R13.12) Attention and concentration deficit following -- Frontal lobe and executive function deficit following -- Impact on safety and function Mild aspiration risk;Moderate aspiration risk   Treatment Recommendations 07/14/2021 Treatment Recommendations Therapy as outlined in treatment plan below   Prognosis 07/14/2021 Prognosis for Safe Diet Advancement Good Barriers to Reach Goals Behavior Barriers/Prognosis Comment -- Diet Recommendations 07/14/2021 SLP Diet Recommendations Dysphagia 3 (Mech soft) solids;Thin liquid;Other (  Comment) Liquid Administration via Cup;Straw;Other (Comment) Medication Administration Whole meds with puree Compensations Slow rate;Small sips/bites;Minimize environmental distractions;Effortful swallow;Multiple dry swallows after each bite/sip Postural Changes Seated upright at 90 degrees   Other Recommendations 07/14/2021 Recommended Consults -- Oral Care Recommendations Oral care BID Other Recommendations -- Follow Up Recommendations Skilled nursing-short term rehab (<3 hours/day) Assistance recommended at discharge Intermittent Supervision/Assistance Functional Status Assessment Patient has had a recent decline in their functional status and demonstrates the ability to make significant improvements in function in a  reasonable and predictable amount of time. Frequency and Duration  07/14/2021 Speech Therapy Frequency (ACUTE ONLY) min 2x/week Treatment Duration 1 week   Oral Phase 07/14/2021 Oral Phase WFL Oral - Pudding Teaspoon -- Oral - Pudding Cup -- Oral - Honey Teaspoon -- Oral - Honey Cup -- Oral - Nectar Teaspoon -- Oral - Nectar Cup -- Oral - Nectar Straw -- Oral - Thin Teaspoon -- Oral - Thin Cup -- Oral - Thin Straw -- Oral - Puree -- Oral - Mech Soft -- Oral - Regular -- Oral - Multi-Consistency -- Oral - Pill -- Oral Phase - Comment --  Pharyngeal Phase 07/14/2021 Pharyngeal Phase Impaired Pharyngeal- Pudding Teaspoon NT Pharyngeal -- Pharyngeal- Pudding Cup NT Pharyngeal -- Pharyngeal- Honey Teaspoon -- Pharyngeal -- Pharyngeal- Honey Cup -- Pharyngeal -- Pharyngeal- Nectar Teaspoon -- Pharyngeal -- Pharyngeal- Nectar Cup -- Pharyngeal -- Pharyngeal- Nectar Straw -- Pharyngeal -- Pharyngeal- Thin Teaspoon NT Pharyngeal -- Pharyngeal- Thin Cup Penetration/Aspiration during swallow;Delayed swallow initiation-vallecula;Reduced airway/laryngeal closure Pharyngeal -- Pharyngeal- Thin Straw Delayed swallow initiation-vallecula;Reduced airway/laryngeal closure;Penetration/Aspiration during swallow Pharyngeal Material enters airway, remains ABOVE vocal cords then ejected out Pharyngeal- Puree Delayed swallow initiation-vallecula Pharyngeal Material does not enter airway Pharyngeal- Mechanical Soft Delayed swallow initiation-vallecula Pharyngeal Material does not enter airway Pharyngeal- Regular Delayed swallow initiation-vallecula Pharyngeal Material does not enter airway Pharyngeal- Multi-consistency NT Pharyngeal -- Pharyngeal- Pill Delayed swallow initiation-vallecula Pharyngeal Material does not enter airway Pharyngeal Comment Delay in the initiation of the swallow to the level of the valleculae with penetration with larger volumes of liquids noted  Cervical Esophageal Phase  07/14/2021 Cervical Esophageal Phase WFL  Pudding Teaspoon -- Pudding Cup -- Honey Teaspoon -- Honey Cup -- Nectar Teaspoon -- Nectar Cup -- Nectar Straw -- Thin Teaspoon -- Thin Cup -- Thin Straw -- Puree -- Mechanical Soft -- Regular -- Multi-consistency -- Pill -- Cervical Esophageal Comment -- Elvina Sidle, M.S., CCC-SLP 07/14/2021, 3:18 PM                        LAB RESULTS: Basic Metabolic Panel: Recent Labs  Lab 07/12/21 0505 07/13/21 0615 07/15/21 1417  NA  --  144 137  K  --  2.8* 4.0  CL  --  112* 105  CO2  --  20* 22  GLUCOSE  --  77 133*  BUN  --  27* 19  CREATININE  --  1.27* 1.10  CALCIUM  --  8.5* 8.5*  MG 2.2  --   --    Liver Function Tests: No results for input(s): AST, ALT, ALKPHOS, BILITOT, PROT, ALBUMIN in the last 168 hours. No results for input(s): LIPASE, AMYLASE in the last 168 hours. Recent Labs  Lab 07/15/21 1417  AMMONIA 18   CBC: Recent Labs  Lab 07/13/21 0615  WBC 10.5  HGB 9.7*  HCT 30.8*  MCV 99.7  PLT 408*   Cardiac Enzymes: Recent Labs  Lab 07/12/21 0505  CKTOTAL 703*  BNP: Invalid input(s): POCBNP CBG: Recent Labs  Lab 07/15/21 1343  GLUCAP 124*       Disposition and Follow-up: Discharge Instructions     Diet - low sodium heart healthy   Complete by: As directed    Discharge wound care:   Complete by: As directed    Per instructions   Increase activity slowly   Complete by: As directed         DISPOSITION: Skilled nursing facility   DISCHARGE FOLLOW-UP  Contact information for follow-up providers     Zhou-Talbert, [name], MD. Schedule an appointment as soon as possible for a visit in 2 week(s).   Specialty: Family Medicine Why: for hospital follow-up, obtain labs [facility]ET Contact information: [address] [place] [address] [phone]              Contact information for after-discharge care     [facility] .   Service: Skilled Nursing Contact information: [place] [phone]                      Time coordinating discharge:  73mins   Signed:   Estill Cotta M.D. [facility] 07/18/2021, 3:59 PM

## 2021-07-19 DIAGNOSIS — N181 Chronic kidney disease, stage 1: Secondary | ICD-10-CM | POA: Diagnosis not present

## 2021-07-19 DIAGNOSIS — N170 Acute kidney failure with tubular necrosis: Secondary | ICD-10-CM | POA: Diagnosis not present

## 2021-07-19 NOTE — Progress Notes (Signed)
PROGRESS NOTE    Lucas [name]  QZR:[id number] DOB: 28-Dec-1945 DOA: 06/27/2021 PCP: Alfonse Flavors, MD    Brief Narrative:  Patient is a 75 year old, COPD, tobacco use, hypertension, chronic lower extremity edema, hyperlipidemia, lives at home.  Family brought him to ED due to what appeared to be a decline in his overall condition with confusion.  Patient was admitted for acute metabolic encephalopathy, associated acute kidney injury, rhabdomyolysis.  He was placed on aggressive IV fluids per nephrology.  Palliative medicine was also consulted.  He did good clinical recovery but remains very debilitated and weak.   Assessment & Plan:   Principal Problem:   Acute on chronic renal failure (HCC) Active Problems:   Parkinson's disease (HCC)   Generalized weakness   Essential hypertension   Hyperlipidemia   COPD (chronic obstructive pulmonary disease) (HCC)   Acute metabolic encephalopathy   Bladder mass   Pressure injury of skin   Acute respiratory failure with hypercapnia (HCC)  Acute on chronic renal failure, acute kidney injury multifactorial exacerbated by rhabdomyolysis and non-anion gap metabolic acidosis: Presented with creatinine 8.4, improved and normalized. Currently on symptomatic treatment.  Avoid nephrotoxins.  Was seen by nephrology.  Currently signed off.  Acute respiratory failure with hypoxemia: Resolved.  Treated as COPD exacerbation.  Currently stabilized.  Discontinue steroids and antibiotic therapy.  Nebulizers as needed.  Acute metabolic encephalopathy superimposed on dementia secondary to Parkinson's disease, chronic debility: Mental status at usual levels.  TSH normal.  CT head without any evidence of a stroke or acute intracranial process.  Ammonia was normal. Continue with Seroquel 12.5 mg nightly, Neurontin 100 mg daily.  Patient is on Sinemet that is continued. Patient is very deconditioned, needing short-term rehab before going home with  family.  Essential hypertension: Blood pressure stable on amlodipine 10 mg daily.  Continue.  Pressure Injury 03/04/21 Sacrum Mid Stage 2 -  Partial thickness loss of dermis presenting as a shallow open injury with a red, pink wound bed without slough. (Active)  03/04/21 2245  Location: Sacrum  Location Orientation: Mid  Staging: Stage 2 -  Partial thickness loss of dermis presenting as a shallow open injury with a red, pink wound bed without slough.  Wound Description (Comments):   Present on Admission: Yes     Pressure Injury 06/28/21 Heel Left Deep Tissue Pressure Injury - Purple or maroon localized area of discolored intact skin or blood-filled blister due to damage of underlying soft tissue from pressure and/or shear. (Active)  06/28/21 0800  Location: Heel  Location Orientation: Left  Staging: Deep Tissue Pressure Injury - Purple or maroon localized area of discolored intact skin or blood-filled blister due to damage of underlying soft tissue from pressure and/or shear.  Wound Description (Comments):   Present on Admission: Yes     Pressure Injury 06/28/21 Buttocks Left Unstageable - Full thickness tissue loss in which the base of the injury is covered by slough (yellow, tan, gray, green or brown) and/or eschar (tan, brown or black) in the wound bed. Wound had black/gray thick cove (Active)  06/28/21 0800  Location: Buttocks  Location Orientation: Left  Staging: Unstageable - Full thickness tissue loss in which the base of the injury is covered by slough (yellow, tan, gray, green or brown) and/or eschar (tan, brown or black) in the wound bed.  Wound Description (Comments): Wound had black/gray thick covering, no drainage  Present on Admission: Yes     Pressure Injury 07/02/21 Rectum Mid Stage 2 -  Partial thickness loss of dermis presenting as a shallow open injury with a red, pink wound bed without slough. skin red, open, small amount of drainage (Active)  07/02/21 0845   Location: Rectum  Location Orientation: Mid  Staging: Stage 2 -  Partial thickness loss of dermis presenting as a shallow open injury with a red, pink wound bed without slough.  Wound Description (Comments): skin red, open, small amount of drainage  Present on Admission:          DVT prophylaxis: heparin injection 5,000 Units Start: 06/27/21 2200   Code Status: DNR Family Communication: None Disposition Plan: Status is: Inpatient  Remains inpatient appropriate because: Unsafe disposition plan.  Waiting for insurance authorization.         Consultants:  Nephrology, signed off  Procedures:  None  Antimicrobials:  Completed antibiotic therapy   Subjective: Patient seen and examined.  Poor historian.  He tells me that he is having pain in the legs and was just given some pain medications.  No other overnight events as per nursing staff.  Objective: Vitals:   07/18/21 2150 07/19/21 0434 07/19/21 0735 07/19/21 0941  BP: (!) 127/45 (!) 143/47  (!) 158/66  Pulse: 77 92  78  Resp: 16 17    Temp: 98.6 F (37 C) 99.2 F (37.3 C)  97.6 F (36.4 C)  TempSrc: Oral Oral  Oral  SpO2: 92% (!) 68% 98% 98%  Weight:      Height:        Intake/Output Summary (Last 24 hours) at 07/19/2021 1233 Last data filed at 07/19/2021 0900 Gross per 24 hour  Intake 820 ml  Output --  Net 820 ml   Filed Weights   06/27/21 1109 06/27/21 1546  Weight: 90.7 kg 81.2 kg    Examination:  General: Alert and oriented x3, chronically sick looking and debilitated.  Frail and sick. Cardiovascular: S1-S2 normal.  Regular rate rhythm.  No pedal edema. Respiratory: Bilateral clear.  Some upper airway conducted sounds. Gastrointestinal: Soft and nontender.  Bowel sounds present. Ext: No pedal edema.  Multiple pressure ulcers as documented above. Neuro: Generalized weakness.      Data Reviewed: I have personally reviewed following labs and imaging studies  CBC: Recent Labs  Lab  07/13/21 0615  WBC 10.5  HGB 9.7*  HCT 30.8*  MCV 99.7  PLT 323*   Basic Metabolic Panel: Recent Labs  Lab 07/13/21 0615 07/15/21 1417  NA 144 137  K 2.8* 4.0  CL 112* 105  CO2 20* 22  GLUCOSE 77 133*  BUN 27* 19  CREATININE 1.27* 1.10  CALCIUM 8.5* 8.5*   GFR: Estimated Creatinine Clearance: 57.9 mL/min (by C-G formula based on SCr of 1.1 mg/dL). Liver Function Tests: No results for input(s): AST, ALT, ALKPHOS, BILITOT, PROT, ALBUMIN in the last 168 hours. No results for input(s): LIPASE, AMYLASE in the last 168 hours. Recent Labs  Lab 07/15/21 1417  AMMONIA 18   Coagulation Profile: No results for input(s): INR, PROTIME in the last 168 hours. Cardiac Enzymes: No results for input(s): CKTOTAL, CKMB, CKMBINDEX, TROPONINI in the last 168 hours. BNP (last 3 results) No results for input(s): PROBNP in the last 8760 hours. HbA1C: No results for input(s): HGBA1C in the last 72 hours. CBG: Recent Labs  Lab 07/15/21 1343  GLUCAP 124*   Lipid Profile: No results for input(s): CHOL, HDL, LDLCALC, TRIG, CHOLHDL, LDLDIRECT in the last 72 hours. Thyroid Function Tests: No results for input(s): TSH, T4TOTAL, FREET4,  T3FREE, THYROIDAB in the last 72 hours. Anemia Panel: No results for input(s): VITAMINB12, FOLATE, FERRITIN, TIBC, IRON, RETICCTPCT in the last 72 hours. Sepsis Labs: No results for input(s): PROCALCITON, LATICACIDVEN in the last 168 hours.  No results found for this or any previous visit (from the past 240 hour(s)).       Radiology Studies: No results found.      Scheduled Meds:  ALPRAZolam  0.5 mg Oral Daily   amLODipine  10 mg Oral Daily   aspirin EC  81 mg Oral Daily   carbidopa-levodopa  1 tablet Oral TID   Chlorhexidine Gluconate Cloth  6 each Topical Daily   feeding supplement  237 mL Oral QID   gabapentin  100 mg Oral Daily   heparin  5,000 Units Subcutaneous Q8H   ipratropium-albuterol  3 mL Nebulization BID   nystatin   Topical  BID   QUEtiapine  12.5 mg Oral QHS   Continuous Infusions:   LOS: 21 days    Time spent: 25 minutes    Barb Merino, MD [facility] Pager [phone]

## 2021-07-19 NOTE — TOC Progression Note (Signed)
Transition of Care [facility]) - Progression Note    Patient Details  Name: Lucas [name] MRN: [id number] Date of Birth: 1946-01-26  Transition of [facility]) CM/SW Contact  [name], LCSW Phone Number: 07/19/2021, 4:27 PM  Clinical Narrative:    [facility] reported that [facility] can take patient tomorrow at 11/24 after 2:00pm. [facility] requested scripts to be faxed to [phone]. CSW to schedule transport. TOC to follow.   Expected Discharge Plan: [place] Barriers to Discharge: Continued Medical Work up  Expected Discharge Plan and Services Expected Discharge Plan: [place] In-house Referral: Clinical Social Work   Post Acute Care Choice: [place] Living arrangements for the past 2 months: Single Family Home Expected Discharge Date: 07/17/21                                     Social Determinants of Health (SDOH) Interventions    Readmission Risk Interventions Readmission Risk Prevention Plan 07/04/2021 06/29/2021 03/28/2020  Transportation Screening Complete Complete Complete  Home Care Screening Complete Complete Complete  Medication Review (RN CM) Complete - Complete  Some recent data might be hidden

## 2021-07-20 DIAGNOSIS — N181 Chronic kidney disease, stage 1: Secondary | ICD-10-CM | POA: Diagnosis not present

## 2021-07-20 DIAGNOSIS — N170 Acute kidney failure with tubular necrosis: Secondary | ICD-10-CM | POA: Diagnosis not present

## 2021-07-20 NOTE — Care Management Important Message (Signed)
Important Message  Patient Details  Name: Lucas [name] MRN: [id number] Date of Birth: 1945-09-29   Medicare Important Message Given:  Yes (late entry, delivered by [facility], [place] 07-20-21)     Tommy Medal 07/20/2021, 9:51 AM

## 2021-07-20 NOTE — Discharge Summary (Signed)
Physician Discharge Summary   Patient ID: Lucas [name] MRN: [id number] DOB/AGE: Jan 21, 1946 75 y.o.  Admit date: 06/27/2021 Discharge date: 07/20/2021  Primary Care Physician:  Alfonse Flavors, MD   Recommendations for Outpatient Follow-up:  Follow up with PCP in 1-2 weeks  Home Health: Patient going to SNF Equipment/Devices:   Discharge Condition: stable CODE STATUS: DNR   Diet recommendation: Dysphagia 3 diet with thin liquids   Discharge Diagnoses:     Acute on chronic renal failure ([place]) with hypoxia  Acute metabolic encephalopathy   Acute kidney injury, multifactorial, exacerbated by rhabdomyolysis  Parkinson's disease ([place]) Dementia  Essential hypertension  Hypokalemia   Transaminitis secondary to rhabdomyolysis  COPD (chronic obstructive pulmonary disease) ([place])  Hyperlipidemia  Bladder mass  Pressure injury   Consults:    Palliative medicine Nephrology  Allergies:  No Known Allergies   DISCHARGE MEDICATIONS: Allergies as of 07/20/2021   No Known Allergies      Medication List     STOP taking these medications    busPIRone 10 MG tablet Commonly known as: BUSPAR   clonazePAM 1 MG tablet Commonly known as: KLONOPIN   furosemide 20 MG tablet Commonly known as: LASIX   furosemide 80 MG tablet Commonly known as: LASIX   hydrochlorothiazide 25 MG tablet Commonly known as: HYDRODIURIL   morphine 15 MG 12 hr tablet Commonly known as: MS CONTIN   PARoxetine 40 MG tablet Commonly known as: PAXIL   rOPINIRole 3 MG tablet Commonly known as: REQUIP       TAKE these medications    albuterol 108 (90 Base) MCG/ACT inhaler Commonly known as: VENTOLIN HFA Inhale 1 puff into the lungs every 4 (four) hours as needed for wheezing or shortness of breath.   ALPRAZolam 0.5 MG tablet Commonly known as: XANAX Take 1 tablet (0.5 mg total) by mouth in the morning.   amLODipine 10 MG tablet Commonly known as: NORVASC Take 1 tablet  (10 mg total) by mouth daily.   aspirin 81 MG EC tablet Take 1 tablet (81 mg total) by mouth daily.   atorvastatin 10 MG tablet Commonly known as: LIPITOR Take 10 mg by mouth every evening.   carbidopa-levodopa 25-100 MG tablet Commonly known as: SINEMET IR Take 1 tablet by mouth 3 (three) times daily.   cholecalciferol 25 MCG (1000 UNIT) tablet Commonly known as: VITAMIN D3 Take 1,000 Units by mouth daily.   Dermacloud Crea Apply 1 application topically daily.   famotidine 20 MG tablet Commonly known as: PEPCID Take 20 mg by mouth 2 (two) times daily.   gabapentin 100 MG capsule Commonly known as: NEURONTIN Take 1 capsule (100 mg total) by mouth daily. What changed:  medication strength how much to take when to take this   haloperidol 1 MG tablet Commonly known as: HALDOL Take 1 tablet (1 mg total) by mouth every 8 (eight) hours as needed for agitation.   ipratropium-albuterol 0.5-2.5 (3) MG/3ML Soln Commonly known as: DUONEB SMARTSIG:3 Milliliter(s) Via Nebulizer Every 6 Hours   lisinopril 20 MG tablet Commonly known as: ZESTRIL Take 20 mg by mouth daily.   nystatin powder Commonly known as: MYCOSTATIN/NYSTOP Apply topically 2 (two) times daily.   oxyCODONE 5 MG immediate release tablet Commonly known as: Oxy IR/ROXICODONE Take 1 tablet (5 mg total) by mouth every 6 (six) hours as needed for breakthrough pain or severe pain.   QUEtiapine 25 MG tablet Commonly known as: SEROQUEL Take 0.5 tablets (12.5 mg total) by mouth at bedtime.  sodium bicarbonate 650 MG tablet Take 1 tablet (650 mg total) by mouth daily.   vitamin B-12 1000 MCG tablet Commonly known as: CYANOCOBALAMIN Take 1,000 mcg by mouth daily.               Discharge Care Instructions  (From admission, onward)           Start     Ordered   07/17/21 0000  Discharge wound care:       Comments: Per instructions   07/17/21 0948           Brief Narrative:  Patient is a  75 year old, COPD, tobacco use, hypertension, chronic lower extremity edema, hyperlipidemia, lives at home.  Family brought him to ED due to what appeared to be a decline in his overall condition with confusion.  Patient was admitted for acute metabolic encephalopathy, associated acute kidney injury, rhabdomyolysis.  He was placed on aggressive IV fluids per nephrology.  Palliative medicine was also consulted.  He did good clinical recovery but remains very debilitated and weak.     Assessment & Plan:   Acute on chronic renal failure, acute kidney injury multifactorial exacerbated by rhabdomyolysis and non-anion gap metabolic acidosis: Presented with creatinine 8.4, improved and normalized. Currently on symptomatic treatment.  Avoid nephrotoxins.  Was seen by nephrology.  Currently signed off.  Acute respiratory failure with hypoxemia: Resolved.  Treated as COPD exacerbation.  Currently stabilized.  Discontinue steroids and antibiotic therapy.  Nebulizers as needed.  Acute metabolic encephalopathy superimposed on dementia secondary to Parkinson's disease, chronic debility: Mental status at usual levels.  TSH normal.  CT head without any evidence of a stroke or acute intracranial process.  Ammonia was normal. Continue with Seroquel 12.5 mg nightly, Neurontin 100 mg daily.  Patient is on Sinemet that is continued. Patient is very deconditioned, needing short-term rehab before going home with family.   Essential hypertension: Blood pressure stable on amlodipine 10 mg daily.  Continue.     Pressure Injury Documentation: Pressure Injury 06/28/21 Heel Left Deep Tissue Pressure Injury - Purple or maroon localized area of discolored intact skin or blood-filled blister due to damage of underlying soft tissue from pressure and/or shear. (Active)  06/28/21 0800  Location: Heel  Location Orientation: Left  Staging: Deep Tissue Pressure Injury - Purple or maroon localized area of discolored intact skin or  blood-filled blister due to damage of underlying soft tissue from pressure and/or shear.  Wound Description (Comments):   Present on Admission: Yes     Pressure Injury 06/28/21 Buttocks Left Unstageable - Full thickness tissue loss in which the base of the injury is covered by slough (yellow, tan, gray, green or brown) and/or eschar (tan, brown or black) in the wound bed. Wound had black/gray thick cove (Active)  06/28/21 0800  Location: Buttocks  Location Orientation: Left  Staging: Unstageable - Full thickness tissue loss in which the base of the injury is covered by slough (yellow, tan, gray, green or brown) and/or eschar (tan, brown or black) in the wound bed.  Wound Description (Comments): Wound had black/gray thick covering, no drainage  Present on Admission: Yes     Pressure Injury 07/02/21 Rectum Mid Stage 2 -  Partial thickness loss of dermis presenting as a shallow open injury with a red, pink wound bed without slough. skin red, open, small amount of drainage (Active)  07/02/21 0845  Location: Rectum  Location Orientation: Mid  Staging: Stage 2 -  Partial thickness loss of dermis presenting as  a shallow open injury with a red, pink wound bed without slough.  Wound Description (Comments): skin red, open, small amount of drainage  Present on Admission:       Day of Discharge S: No complaints, lungs clear, afebrile, no acute issues overnight, awaiting skilled nursing facility  BP (!) 153/59 (BP Location: Right Arm)   Pulse 80   Temp 98.3 F (36.8 C) (Oral)   Resp 18   Ht 5\' 8"  (1.727 m)   Wt 81.2 kg   SpO2 95%   BMI 27.22 kg/m   Physical Exam: General: Alert and awake oriented x3 not in any acute distress. CVS: S1-S2 clear no murmur rubs or gallops Chest: clear to auscultation bilaterally, no wheezing Abdomen: soft nontender, nondistended, normal bowel sounds Extremities: no cyanosis, clubbing or edema noted bilaterally    Get Medicines reviewed and  adjusted: Please take all your medications with you for your next visit with your Primary MD  Please request your Primary MD to go over all hospital tests and procedure/radiological results at the follow up. Please ask your Primary MD to get all Hospital records sent to his/her office.  If you experience worsening of your admission symptoms, develop shortness of breath, life threatening emergency, suicidal or homicidal thoughts you must seek medical attention immediately by calling 911 or calling your MD immediately  if symptoms less severe.  You must read complete instructions/literature along with all the possible adverse reactions/side effects for all the Medicines you take and that have been prescribed to you. Take any new Medicines after you have completely understood and accept all the possible adverse reactions/side effects.   Do not drive when taking pain medications.   Do not take more than prescribed Pain, Sleep and Anxiety Medications  Special Instructions: If you have smoked or chewed Tobacco  in the last 2 yrs please stop smoking, stop any regular Alcohol  and or any Recreational drug use.  Wear Seat belts while driving.  Please note  You were cared for by a hospitalist during your hospital stay. Once you are discharged, your primary care physician will handle any further medical issues. Please note that NO REFILLS for any discharge medications will be authorized once you are discharged, as it is imperative that you return to your primary care physician (or establish a relationship with a primary care physician if you do not have one) for your aftercare needs so that they can reassess your need for medications and monitor your lab values.   The results of significant diagnostics from this hospitalization (including imaging, microbiology, ancillary and laboratory) are listed below for reference.      Procedures/Studies:  CT ABDOMEN PELVIS WO CONTRAST  Result Date:  06/27/2021 CLINICAL DATA:  Abdominal pain. Strong urine smell. Elevated creatinine. EXAM: CT ABDOMEN AND PELVIS WITHOUT CONTRAST TECHNIQUE: Multidetector CT imaging of the abdomen and pelvis was performed following the standard protocol without IV contrast. COMPARISON:  11/03/2017. FINDINGS: Lower chest: No acute abnormality. Hepatobiliary: No focal liver abnormality is seen. No gallstones, gallbladder wall thickening, or biliary dilatation. Pancreas: Unremarkable. No pancreatic ductal dilatation or surrounding inflammatory changes. Spleen: Normal in size without focal abnormality. Adrenals/Urinary Tract: No adrenal masses. No right kidney. Mild left renal cortical thinning. No renal stone or hydronephrosis. Normal left ureter. Dilated tubular structure extends from just below the right adrenal gland to the right posterior bladder at the level of the ureteral trigone. This dilated ureter is contiguous with a bladder filling defect, also of the same  attenuation. This appearance is concerning for a bladder malignancy, but is unchanged from the 11/03/2017 exam strongly favoring a benign etiology. Bladder is minimally distended, otherwise unremarkable. Stomach/Bowel: Normal stomach. Small bowel and colon are normal in caliber. No wall thickening. Subtle hazy opacity lies adjacent to the proximal to mid sigmoid colon where there are also multiple diverticula. This is not evident on the prior CT. No other evidence of pericolonic inflammation. Normal appendix is visualized. Vascular/Lymphatic: Aortic atherosclerosis. No aneurysm. No enlarged lymph nodes. Reproductive: Prostate is normal in size. Other: Small fat containing left inguinal hernia.  No ascites. Musculoskeletal: No fracture or acute finding.  No bone lesion. IMPRESSION: 1. Subtle hazy opacity lies adjacent to the proximal to mid sigmoid colon, where there are multiple diverticula. This is an equivocal finding, but mild uncomplicated diverticulitis should be  consider if this correlates clinically. No other evidence of an acute abnormality within the abdomen or pelvis. 2. Dilated tubular structure that is contiguous with a mass projecting within the bladder. There is no visualized right kidney, presumably congenitally absent. The dilated tubular structure is likely the right gonadal vein. However, the course parallels the expected course of a right ureter. Both the bladder mass and the dilated tubular structure are stable compared to the 11/03/2017 CT. 3. Aortic atherosclerosis. Electronically Signed   By: Lajean Manes M.D.   On: 06/27/2021 13:49   CT HEAD WO CONTRAST (5MM)  Result Date: 07/15/2021 CLINICAL DATA:  Altered mental status EXAM: CT HEAD WITHOUT CONTRAST TECHNIQUE: Contiguous axial images were obtained from the base of the skull through the vertex without intravenous contrast. COMPARISON:  06/27/2021, 12/21/2019 FINDINGS: Brain: No evidence of acute infarction, hemorrhage, extra-axial collection or mass lesion/mass effect. Unchanged prominence of the bilateral lateral and third ventricles. Mild periventricular white matter hypodensity. Vascular: No hyperdense vessel or unexpected calcification. Skull: Normal. Negative for fracture or focal lesion. Sinuses/Orbits: Chronic bilateral mucosal and bony sinus wall thickening of the maxillary and sphenoid sinuses. Partial opacification of the ethmoid air cells. Small bilateral air-fluid levels of the maxillary sinuses. Other: None. IMPRESSION: 1. No acute intracranial pathology. 2. Small-vessel white matter disease. 3. Unchanged prominence of the bilateral lateral and third ventricles, which may be related to global cerebral volume loss but can be seen in normal pressure hydrocephalus. 4. Chronic paranasal sinus disease. Electronically Signed   By: Delanna Ahmadi M.D.   On: 07/15/2021 16:17   CT Head Wo Contrast  Result Date: 06/27/2021 CLINICAL DATA:  Altered mental status. EXAM: CT HEAD WITHOUT CONTRAST  TECHNIQUE: Contiguous axial images were obtained from the base of the skull through the vertex without intravenous contrast. COMPARISON:  December 21, 2019. FINDINGS: Brain: Mild chronic ischemic white matter disease is noted. Stable mild ventricular prominence is noted which may be due to mild cortical atrophy. No mass effect or midline shift is noted. There is no evidence of mass lesion, hemorrhage or acute infarction. Vascular: No hyperdense vessel or unexpected calcification. Skull: Normal. Negative for fracture or focal lesion. Sinuses/Orbits: No acute finding. Other: None. IMPRESSION: No acute intracranial abnormality seen. Stable mild ventricular dilatation as described above. Electronically Signed   By: Marijo Conception M.D.   On: 06/27/2021 13:26   US SCROTUM  Result Date: 07/05/2021 CLINICAL DATA:  Edema EXAM: ULTRASOUND OF SCROTUM TECHNIQUE: Complete ultrasound examination of the testicles, epididymis, and other scrotal structures was performed. COMPARISON:  None. FINDINGS: Right testicle Measurements: 4.5 x 2.9 x 2.2 cm. No mass or microlithiasis visualized. Left testicle  Measurements: 4 x 2.7 x 3 cm. No mass or microlithiasis visualized. Right epididymis:  Normal size.  7 mm epididymal head cyst. Left epididymis:  Normal in size and appearance. Hydrocele: Moderate right hydrocele. Moderate to large left hydrocele. Varicocele:  None visualized. Diffuse scrotal skin thickening and subcutaneous edema with no focal collection identified. IMPRESSION: 1. No testicular mass identified. 2. Bilateral moderate to large hydroceles. 3. Diffuse scrotal skin thickening and subcutaneous edema with no focal collection identified. 4. Right epididymal head cyst. Electronically Signed   By: Ofilia Neas M.D.   On: 07/05/2021 11:56   DG Chest Port 1 View  Result Date: 07/13/2021 CLINICAL DATA:  Short of breath.  History COPD EXAM: PORTABLE CHEST 1 VIEW COMPARISON:  07/09/2021 FINDINGS: Heart size upper normal.  Normal vascularity. Interval improvement in bibasilar atelectasis. Improved lung volume compared to the prior study. IMPRESSION: Improved lung volume and bibasilar atelectasis compared with the prior study. No new finding. Electronically Signed   By: Franchot Gallo M.D.   On: 07/13/2021 08:45   DG Chest Port 1 View  Result Date: 07/09/2021 CLINICAL DATA:  75 year old male with shortness of breath. EXAM: PORTABLE CHEST - 1 VIEW COMPARISON:  07/07/2021 FINDINGS: The mediastinal contours are within normal limits. Unchanged mild cardiomegaly. Low lung volumes. Bibasilar subsegmental atelectasis, slightly more conspicuous than comparison. No pleural effusion or pneumothorax. No acute osseous abnormality. IMPRESSION: Low lung volumes bibasilar subsegmental atelectasis. Electronically Signed   By: Ruthann Cancer M.D.   On: 07/09/2021 10:10   DG CHEST PORT 1 VIEW  Result Date: 07/07/2021 CLINICAL DATA:  Fluid overload EXAM: PORTABLE CHEST 1 VIEW COMPARISON:  07/03/2021, 06/27/2021, 12/21/2019 FINDINGS: Low lung volumes. Borderline cardiomegaly. Mild bronchovascular crowding due to low lung volume. No pleural effusion or pneumothorax. IMPRESSION: Hypoventilatory changes with borderline cardiomegaly Electronically Signed   By: Donavan Foil M.D.   On: 07/07/2021 23:09   DG CHEST PORT 1 VIEW  Result Date: 07/03/2021 CLINICAL DATA:  Shortness of breath EXAM: PORTABLE CHEST 1 VIEW COMPARISON:  Chest x-ray 06/27/2021 FINDINGS: Heart size is upper normal. Mediastinum appears stable. No focal consolidation identified. No significant pleural effusion. No pneumothorax. IMPRESSION: No acute intrathoracic process identified. Electronically Signed   By: Ofilia Neas M.D.   On: 07/03/2021 12:43   DG Chest Port 1 View  Result Date: 06/27/2021 CLINICAL DATA:  Altered mental status in a 75 year old male. EXAM: PORTABLE CHEST 1 VIEW COMPARISON:  June 09, 2021. FINDINGS: EKG leads project over the chest. Trachea  midline. Cardiomediastinal contours and hilar structures stable. Linear opacity present over the LEFT hemidiaphragm. No visible pneumothorax. No sign of pleural effusion. On limited assessment no acute skeletal process. IMPRESSION: Linear opacity over the LEFT hemidiaphragm may represent atelectasis or scarring. No acute cardiopulmonary process. Electronically Signed   By: Zetta Bills M.D.   On: 06/27/2021 12:10   DG Swallowing Func-Speech Pathology  Result Date: 07/14/2021 Table formatting from the original result was not included. Objective Swallowing Evaluation: Type of Study: MBS-Modified Barium Swallow Study  Patient Details Name: CORDARRIUS COAD MRN: [id number] Date of Birth: [birth year] Today's Date: 07/14/2021 Time: SLP Start Time (ACUTE ONLY): 1045 -SLP Stop Time (ACUTE ONLY): 1105 SLP Time Calculation (min) (ACUTE ONLY): 20 min Past Medical History: Past Medical History: Diagnosis Date  Acute CVA (cerebrovascular accident) ([place]) 06/06/2015  Acute ischemic stroke ([place]) 01/19/2015  Left midbrain/thalamus.  AKI (acute kidney injury) ([place]) 03/27/2020  Alcohol use   Anxiety   Cataract   Cerebral ventriculomegaly 01/20/2015  COPD (chronic obstructive pulmonary disease) ([facility])   Depression   Depression with anxiety   Hyperlipidemia   Hypertension   Neuromuscular disorder ([facility])   Parkinson's disease ([place])   Substance abuse ([place])   Tobacco abuse  Past Surgical History: Past Surgical History: Procedure Laterality Date  APPENDECTOMY    EYE SURGERY    SHOULDER SURGERY   HPI: DAREL RICKETTS is a 75 y.o. male with medical history significant of Parkinson's disease, prior CVA, COPD, tobacco use, hypertension, chronic lower extremity edema, hyperlipidemia, normally lives at home.  Family had brought him to ED due to what appears to be a chronic decline in his overall condition with confusion.  Patient was admitted for acute metabolic encephalopathy and associated acute kidney injury that appears to be due to  rhabdomyolysis.  Patient continues to require aggressive IV fluids per nephrology.  Continue to monitor creatinine trend and follow for outcomes.  Nephrology and palliative care following. BSE completed and recommending MBS to r/o aspiration and determine safest diet.  Subjective: Pt cooperative throughout MBSS with low vocal intensity noted during conversation and decreased overall intelligibility.  Recommendations for follow up therapy are one component of a multi-disciplinary discharge planning process, led by the attending physician.  Recommendations may be updated based on patient status, additional functional criteria and insurance authorization. Assessment / Plan / Recommendation Clinical Impressions 07/14/2021 Clinical Impression Pt presents with mild oropharyngeal dysphagia characterized by mild oral containment with min disorganized swallow during larger volumes/regular consistencies, but eventually cleared oral cavity.  Pt with an overall delay in the initation of the swallow with all consistencies to the level of the valleculae.  This affected larger volumes of liquids primarily as thin liquids penetrated the laryngeal vestibule d/t decreased laryngeal closure, but did not result in aspiration.  Pt is at risk for aspiration with thin via larger volumes d/t impulsivity during meals.  If pt utilizes small sips, he reduces his risk for aspiration.  During transition of barium tablet during the study, pt required a repetitive swallow/liquid wash to clear vallecular space.  Recommend continuing Dysphagia 3/thin liquids with SMALL SIPS and medications given in puree with FULL supervision during meals/snacks.  ST will continue to f/u in acute setting for aspiration/swallowing precaution education and diet tolerance. SLP Visit Diagnosis Dysphagia, oropharyngeal phase (R13.12) Attention and concentration deficit following -- Frontal lobe and executive function deficit following -- Impact on safety and function  Mild aspiration risk;Moderate aspiration risk   Treatment Recommendations 07/14/2021 Treatment Recommendations Therapy as outlined in treatment plan below   Prognosis 07/14/2021 Prognosis for Safe Diet Advancement Good Barriers to Reach Goals Behavior Barriers/Prognosis Comment -- Diet Recommendations 07/14/2021 SLP Diet Recommendations Dysphagia 3 (Mech soft) solids;Thin liquid;Other (Comment) Liquid Administration via Cup;Straw;Other (Comment) Medication Administration Whole meds with puree Compensations Slow rate;Small sips/bites;Minimize environmental distractions;Effortful swallow;Multiple dry swallows after each bite/sip Postural Changes Seated upright at 90 degrees   Other Recommendations 07/14/2021 Recommended Consults -- Oral Care Recommendations Oral care BID Other Recommendations -- Follow Up Recommendations Skilled nursing-short term rehab (<3 hours/day) Assistance recommended at discharge Intermittent Supervision/Assistance Functional Status Assessment Patient has had a recent decline in their functional status and demonstrates the ability to make significant improvements in function in a reasonable and predictable amount of time. Frequency and Duration  07/14/2021 Speech Therapy Frequency (ACUTE ONLY) min 2x/week Treatment Duration 1 week   Oral Phase 07/14/2021 Oral Phase WFL Oral - Pudding Teaspoon -- Oral - Pudding Cup -- Oral - Honey Teaspoon -- Oral -  Honey Cup -- Oral - Nectar Teaspoon -- Oral - Nectar Cup -- Oral - Nectar Straw -- Oral - Thin Teaspoon -- Oral - Thin Cup -- Oral - Thin Straw -- Oral - Puree -- Oral - Mech Soft -- Oral - Regular -- Oral - Multi-Consistency -- Oral - Pill -- Oral Phase - Comment --  Pharyngeal Phase 07/14/2021 Pharyngeal Phase Impaired Pharyngeal- Pudding Teaspoon NT Pharyngeal -- Pharyngeal- Pudding Cup NT Pharyngeal -- Pharyngeal- Honey Teaspoon -- Pharyngeal -- Pharyngeal- Honey Cup -- Pharyngeal -- Pharyngeal- Nectar Teaspoon -- Pharyngeal -- Pharyngeal- Nectar  Cup -- Pharyngeal -- Pharyngeal- Nectar Straw -- Pharyngeal -- Pharyngeal- Thin Teaspoon NT Pharyngeal -- Pharyngeal- Thin Cup Penetration/Aspiration during swallow;Delayed swallow initiation-vallecula;Reduced airway/laryngeal closure Pharyngeal -- Pharyngeal- Thin Straw Delayed swallow initiation-vallecula;Reduced airway/laryngeal closure;Penetration/Aspiration during swallow Pharyngeal Material enters airway, remains ABOVE vocal cords then ejected out Pharyngeal- Puree Delayed swallow initiation-vallecula Pharyngeal Material does not enter airway Pharyngeal- Mechanical Soft Delayed swallow initiation-vallecula Pharyngeal Material does not enter airway Pharyngeal- Regular Delayed swallow initiation-vallecula Pharyngeal Material does not enter airway Pharyngeal- Multi-consistency NT Pharyngeal -- Pharyngeal- Pill Delayed swallow initiation-vallecula Pharyngeal Material does not enter airway Pharyngeal Comment Delay in the initiation of the swallow to the level of the valleculae with penetration with larger volumes of liquids noted  Cervical Esophageal Phase  07/14/2021 Cervical Esophageal Phase WFL Pudding Teaspoon -- Pudding Cup -- Honey Teaspoon -- Honey Cup -- Nectar Teaspoon -- Nectar Cup -- Nectar Straw -- Thin Teaspoon -- Thin Cup -- Thin Straw -- Puree -- Mechanical Soft -- Regular -- Multi-consistency -- Pill -- Cervical Esophageal Comment -- Elvina Sidle, M.S., CCC-SLP 07/14/2021, 3:18 PM                        LAB RESULTS: Basic Metabolic Panel: Recent Labs  Lab 07/15/21 1417  NA 137  K 4.0  CL 105  CO2 22  GLUCOSE 133*  BUN 19  CREATININE 1.10  CALCIUM 8.5*   Liver Function Tests: No results for input(s): AST, ALT, ALKPHOS, BILITOT, PROT, ALBUMIN in the last 168 hours. No results for input(s): LIPASE, AMYLASE in the last 168 hours. Recent Labs  Lab 07/15/21 1417  AMMONIA 18   CBC: No results for input(s): WBC, NEUTROABS, HGB, HCT, MCV, PLT in the last 168 hours.  Cardiac  Enzymes: No results for input(s): CKTOTAL, CKMB, CKMBINDEX, TROPONINI in the last 168 hours.  BNP: Invalid input(s): POCBNP CBG: Recent Labs  Lab 07/15/21 1343  GLUCAP 124*       Disposition and Follow-up: Discharge Instructions     Diet - low sodium heart healthy   Complete by: As directed    Discharge wound care:   Complete by: As directed    Per instructions   Increase activity slowly   Complete by: As directed         DISPOSITION: Skilled nursing facility   DISCHARGE FOLLOW-UP  Contact information for follow-up providers     Zhou-Talbert, [name], MD. Schedule an appointment as soon as possible for a visit in 2 week(s).   Specialty: Family Medicine Why: for hospital follow-up, obtain labs BMET Contact information: [address] [place] [address] [phone]              Contact information for after-discharge care     [facility] .   Service: Skilled Nursing Contact information: [place]  [phone]                      Time coordinating discharge:  20mins   Signed:   Barb Merino M.D. [facility] 07/20/2021, 9:42 AM

## 2021-07-20 NOTE — TOC Progression Note (Signed)
CSW spoke with Lucas [name] x2 about if it would be possible to D/C pt to [facility] in [place] after 2 pm today. They do not have any convalescent trucks and will not be able to do it. TOC to follow.

## 2021-07-20 NOTE — Progress Notes (Signed)
Attempted to call report to Kay. No answer x2. No transport available per charge RN will be discharged tomorrow.

## 2021-07-20 NOTE — Plan of Care (Signed)
  Problem: Education: Goal: Knowledge of General Education information will improve Description Including pain rating scale, medication(s)/side effects and non-pharmacologic comfort measures Outcome: Progressing   Problem: Health Behavior/Discharge Planning: Goal: Ability to manage health-related needs will improve Outcome: Progressing   

## 2021-07-20 NOTE — Progress Notes (Signed)
PROGRESS NOTE    Lucas [name]  FKC:[id number] DOB: 1945-10-04 DOA: 06/27/2021 PCP: Alfonse Flavors, MD    Brief Narrative:  Patient is a 75 year old, COPD, tobacco use, hypertension, chronic lower extremity edema, hyperlipidemia, lives at home.  Family brought him to ED due to what appeared to be a decline in his overall condition with confusion.  Patient was admitted for acute metabolic encephalopathy, associated acute kidney injury, rhabdomyolysis.  He was placed on aggressive IV fluids per nephrology.  Palliative medicine was also consulted.  He did good clinical recovery but remains very debilitated and weak.   Assessment & Plan:   Principal Problem:   Acute on chronic renal failure (HCC) Active Problems:   Parkinson's disease (HCC)   Generalized weakness   Essential hypertension   Hyperlipidemia   COPD (chronic obstructive pulmonary disease) (HCC)   Acute metabolic encephalopathy   Bladder mass   Pressure injury of skin   Acute respiratory failure with hypercapnia (HCC)  Acute on chronic renal failure, acute kidney injury multifactorial exacerbated by rhabdomyolysis and non-anion gap metabolic acidosis: Presented with creatinine 8.4, improved and normalized. Currently on symptomatic treatment.  Avoid nephrotoxins.  Was seen by nephrology.  Currently signed off.  Acute respiratory failure with hypoxemia: Resolved.  Treated as COPD exacerbation.  Currently stabilized.  Discontinue steroids and antibiotic therapy.  Nebulizers as needed.  Acute metabolic encephalopathy superimposed on dementia secondary to Parkinson's disease, chronic debility: Mental status at usual levels.  TSH normal.  CT head without any evidence of a stroke or acute intracranial process.  Ammonia was normal. Continue with Seroquel 12.5 mg nightly, Neurontin 100 mg daily.  Patient is on Sinemet that is continued. Patient is very deconditioned, needing short-term rehab before going home with  family.  Essential hypertension: Blood pressure stable on amlodipine 10 mg daily.  Continue.  Patient is medically stable to transfer to skilled level of care today.  Discharge reconciliation reviewed and updated.  Discharge summary was updated.  Pressure Injury 03/04/21 Sacrum Mid Stage 2 -  Partial thickness loss of dermis presenting as a shallow open injury with a red, pink wound bed without slough. (Active)  03/04/21 2245  Location: Sacrum  Location Orientation: Mid  Staging: Stage 2 -  Partial thickness loss of dermis presenting as a shallow open injury with a red, pink wound bed without slough.  Wound Description (Comments):   Present on Admission: Yes     Pressure Injury 06/28/21 Heel Left Deep Tissue Pressure Injury - Purple or maroon localized area of discolored intact skin or blood-filled blister due to damage of underlying soft tissue from pressure and/or shear. (Active)  06/28/21 0800  Location: Heel  Location Orientation: Left  Staging: Deep Tissue Pressure Injury - Purple or maroon localized area of discolored intact skin or blood-filled blister due to damage of underlying soft tissue from pressure and/or shear.  Wound Description (Comments):   Present on Admission: Yes     Pressure Injury 06/28/21 Buttocks Left Unstageable - Full thickness tissue loss in which the base of the injury is covered by slough (yellow, tan, gray, green or brown) and/or eschar (tan, brown or black) in the wound bed. Wound had black/gray thick cove (Active)  06/28/21 0800  Location: Buttocks  Location Orientation: Left  Staging: Unstageable - Full thickness tissue loss in which the base of the injury is covered by slough (yellow, tan, gray, green or brown) and/or eschar (tan, brown or black) in the wound bed.  Wound Description (Comments):  Wound had black/gray thick covering, no drainage  Present on Admission: Yes     Pressure Injury 07/02/21 Rectum Mid Stage 2 -  Partial thickness loss of dermis  presenting as a shallow open injury with a red, pink wound bed without slough. skin red, open, small amount of drainage (Active)  07/02/21 0845  Location: Rectum  Location Orientation: Mid  Staging: Stage 2 -  Partial thickness loss of dermis presenting as a shallow open injury with a red, pink wound bed without slough.  Wound Description (Comments): skin red, open, small amount of drainage  Present on Admission:          DVT prophylaxis: heparin injection 5,000 Units Start: 06/27/21 2200   Code Status: DNR Family Communication: Patient's son at the bedside. Disposition Plan: Status is: Inpatient  Remains inpatient appropriate because: Waiting for insurance authorization.  Possible discharge today.         Consultants:  Nephrology, signed off  Procedures:  None  Antimicrobials:  Completed antibiotic therapy   Subjective:  Patient seen and examined.  He was helped by the nursing staff to eat.  His son was at the bedside.  Today patient was more alert and interactive.  He was happy to get out of the hospital and was very thankful.  No new events.  Objective: Vitals:   07/19/21 1940 07/19/21 2018 07/20/21 0529 07/20/21 0716  BP: (!) 153/71  (!) 153/59   Pulse: 70  80   Resp: 18  18   Temp: 98 F (36.7 C)  98.3 F (36.8 C)   TempSrc: Oral  Oral   SpO2: 100% 97% 94% 95%  Weight:      Height:        Intake/Output Summary (Last 24 hours) at 07/20/2021 0943 Last data filed at 07/20/2021 0500 Gross per 24 hour  Intake 1080 ml  Output 1000 ml  Net 80 ml   Filed Weights   06/27/21 1109 06/27/21 1546  Weight: 90.7 kg 81.2 kg    Examination:  General: Alert and oriented x3, chronically sick looking and debilitated but not in any distress. Cardiovascular: S1-S2 normal.  Regular rate rhythm.  No pedal edema. Respiratory: Bilateral clear.  Some upper airway conducted sounds. Gastrointestinal: Soft and nontender.  Bowel sounds present. Ext: No pedal edema.   Multiple pressure ulcers as documented above. Neuro: Generalized weakness.      Data Reviewed: I have personally reviewed following labs and imaging studies  CBC: No results for input(s): WBC, NEUTROABS, HGB, HCT, MCV, PLT in the last 168 hours.  Basic Metabolic Panel: Recent Labs  Lab 07/15/21 1417  NA 137  K 4.0  CL 105  CO2 22  GLUCOSE 133*  BUN 19  CREATININE 1.10  CALCIUM 8.5*   GFR: Estimated Creatinine Clearance: 57.9 mL/min (by C-G formula based on SCr of 1.1 mg/dL). Liver Function Tests: No results for input(s): AST, ALT, ALKPHOS, BILITOT, PROT, ALBUMIN in the last 168 hours. No results for input(s): LIPASE, AMYLASE in the last 168 hours. Recent Labs  Lab 07/15/21 1417  AMMONIA 18   Coagulation Profile: No results for input(s): INR, PROTIME in the last 168 hours. Cardiac Enzymes: No results for input(s): CKTOTAL, CKMB, CKMBINDEX, TROPONINI in the last 168 hours. BNP (last 3 results) No results for input(s): PROBNP in the last 8760 hours. HbA1C: No results for input(s): HGBA1C in the last 72 hours. CBG: Recent Labs  Lab 07/15/21 1343  GLUCAP 124*   Lipid Profile: No results for  input(s): CHOL, HDL, LDLCALC, TRIG, CHOLHDL, LDLDIRECT in the last 72 hours. Thyroid Function Tests: No results for input(s): TSH, T4TOTAL, FREET4, T3FREE, THYROIDAB in the last 72 hours. Anemia Panel: No results for input(s): VITAMINB12, FOLATE, FERRITIN, TIBC, IRON, RETICCTPCT in the last 72 hours. Sepsis Labs: No results for input(s): PROCALCITON, LATICACIDVEN in the last 168 hours.  No results found for this or any previous visit (from the past 240 hour(s)).       Radiology Studies: No results found.      Scheduled Meds:  ALPRAZolam  0.5 mg Oral Daily   amLODipine  10 mg Oral Daily   aspirin EC  81 mg Oral Daily   carbidopa-levodopa  1 tablet Oral TID   Chlorhexidine Gluconate Cloth  6 each Topical Daily   feeding supplement  237 mL Oral QID   gabapentin   100 mg Oral Daily   heparin  5,000 Units Subcutaneous Q8H   ipratropium-albuterol  3 mL Nebulization BID   nystatin   Topical BID   QUEtiapine  12.5 mg Oral QHS   Continuous Infusions:   LOS: 22 days    Time spent: 25 minutes    Barb Merino, MD [facility] Pager [phone]

## 2021-07-21 DIAGNOSIS — N170 Acute kidney failure with tubular necrosis: Secondary | ICD-10-CM | POA: Diagnosis not present

## 2021-07-21 DIAGNOSIS — N181 Chronic kidney disease, stage 1: Secondary | ICD-10-CM | POA: Diagnosis not present

## 2021-07-21 NOTE — Progress Notes (Signed)
Nsg Discharge Note  Admit Date:  06/27/2021 Discharge date: 07/21/2021   Lucas [name] to be D/C'd [place] per MD order.  AVS completed. Patient/caregiver able to verbalize understanding.  Discharge Medication: Allergies as of 07/21/2021   No Known Allergies      Medication List     STOP taking these medications    busPIRone 10 MG tablet Commonly known as: BUSPAR   clonazePAM 1 MG tablet Commonly known as: KLONOPIN   furosemide 20 MG tablet Commonly known as: LASIX   furosemide 80 MG tablet Commonly known as: LASIX   hydrochlorothiazide 25 MG tablet Commonly known as: HYDRODIURIL   morphine 15 MG 12 hr tablet Commonly known as: MS CONTIN   PARoxetine 40 MG tablet Commonly known as: PAXIL   rOPINIRole 3 MG tablet Commonly known as: REQUIP       TAKE these medications    albuterol 108 (90 Base) MCG/ACT inhaler Commonly known as: VENTOLIN HFA Inhale 1 puff into the lungs every 4 (four) hours as needed for wheezing or shortness of breath.   ALPRAZolam 0.5 MG tablet Commonly known as: XANAX Take 1 tablet (0.5 mg total) by mouth in the morning.   amLODipine 10 MG tablet Commonly known as: NORVASC Take 1 tablet (10 mg total) by mouth daily.   aspirin 81 MG EC tablet Take 1 tablet (81 mg total) by mouth daily.   atorvastatin 10 MG tablet Commonly known as: LIPITOR Take 10 mg by mouth every evening.   carbidopa-levodopa 25-100 MG tablet Commonly known as: SINEMET IR Take 1 tablet by mouth 3 (three) times daily.   cholecalciferol 25 MCG (1000 UNIT) tablet Commonly known as: VITAMIN D3 Take 1,000 Units by mouth daily.   Dermacloud Crea Apply 1 application topically daily.   famotidine 20 MG tablet Commonly known as: PEPCID Take 20 mg by mouth 2 (two) times daily.   gabapentin 100 MG capsule Commonly known as: NEURONTIN Take 1 capsule (100 mg total) by mouth daily. What changed:  medication strength how much to take when to take this    haloperidol 1 MG tablet Commonly known as: HALDOL Take 1 tablet (1 mg total) by mouth every 8 (eight) hours as needed for agitation.   ipratropium-albuterol 0.5-2.5 (3) MG/3ML Soln Commonly known as: DUONEB SMARTSIG:3 Milliliter(s) Via Nebulizer Every 6 Hours   lisinopril 20 MG tablet Commonly known as: ZESTRIL Take 20 mg by mouth daily.   nystatin powder Commonly known as: MYCOSTATIN/NYSTOP Apply topically 2 (two) times daily.   oxyCODONE 5 MG immediate release tablet Commonly known as: Oxy IR/ROXICODONE Take 1 tablet (5 mg total) by mouth every 6 (six) hours as needed for breakthrough pain or severe pain.   QUEtiapine 25 MG tablet Commonly known as: SEROQUEL Take 0.5 tablets (12.5 mg total) by mouth at bedtime.   sodium bicarbonate 650 MG tablet Take 1 tablet (650 mg total) by mouth daily.   vitamin B-12 1000 MCG tablet Commonly known as: CYANOCOBALAMIN Take 1,000 mcg by mouth daily.               Discharge Care Instructions  (From admission, onward)           Start     Ordered   07/17/21 0000  Discharge wound care:       Comments: Per instructions   07/17/21 0948            Discharge Assessment: Vitals:   07/21/21 0738 07/21/21 0810  BP:  139/76  Pulse:    Resp:    Temp:    SpO2: 96%    Skin clean, dry and intact without evidence of skin break down, no evidence of skin tears noted. IV catheter discontinued intact. Site without signs and symptoms of complications - no redness or edema noted at insertion site, patient denies c/o pain - only slight tenderness at site.  Dressing with slight pressure applied.  D/c Instructions-Education: Discharge instructions given to patient/family with verbalized understanding. D/c education completed with patient/family including follow up instructions, medication list, d/c activities limitations if indicated, with other d/c instructions as indicated by MD - patient able to verbalize understanding, all  questions fully answered. Patient instructed to return to ED, call 911, or call MD for any changes in condition.  Patient escorted via [place], and D/C home via private auto.  Kathie Rhodes, RN 07/21/2021 9:36 AM

## 2021-07-21 NOTE — TOC Transition Note (Addendum)
Transition of Care [facility]) - CM/SW Discharge Note   Patient Details  Name: Lucas [name] MRN: [id number] Date of Birth: 1946/08/10  [facility] CM/SW Contact:  [name], LCSW Phone Number: 07/21/2021, 9:53 AM   Clinical Narrative:    CSW contacted [facility] to inquire if they are ready for discharge. [facility] agreeable to take patient today. CSW faxed discharge summary. Nurse to call report and fax prescriptions. Nurse completed med necessity. Patient on [facility] list. TOC signing off.   Addendum TOC was able to [name] for patient. CSW contacted [facility] to schedule transport. EMS reported that they did not see patient on list. TOC signing of    Final next level of care: Skilled Nursing Facility Barriers to Discharge: Barriers Resolved   Patient Goals and CMS Choice Patient states their goals for this hospitalization and ongoing recovery are:: rehab with [facility]. CMS Medicare.gov Compare Post Acute Care list provided to:: Patient Choice offered to / list presented to : Patient  Discharge Placement              Patient chooses bed at: [facility] [place] Patient to be transferred to facility by: [facility] Name of family member notified: Winn, Muehl (Son)   [phone] Patient and family notified of of transfer: 07/21/21  Discharge Plan and Services In-house Referral: Clinical Social Work   Post Acute Care Choice: [place]                               Social Determinants of Health ([place]) Interventions     Readmission Risk Interventions Readmission Risk Prevention Plan 07/04/2021 06/29/2021 03/28/2020  Transportation Screening Complete Complete Complete  Home Care Screening Complete Complete Complete  Medication Review (RN CM) Complete - Complete  Some recent data might be hidden

## 2021-07-21 NOTE — Progress Notes (Signed)
I examined patient for discharge readiness.  He has some pain in the ankle taken care of by oral pain medications.  He was supposed to be discharged yesterday, transportation could not be arranged.  Discharge summary was reviewed and no changes needed.  Medically stable for discharge.  Family were updated yesterday at the bedside.  Total time spent: 15 minutes

## 2021-08-27 DEATH — deceased
